# Patient Record
Sex: Female | Born: 1945
Health system: Southern US, Community
[De-identification: ages and names within clinical notes are randomized; demographics above are authoritative.]

## PROBLEM LIST (undated history)

## (undated) DIAGNOSIS — E039 Hypothyroidism, unspecified: Secondary | ICD-10-CM

## (undated) DIAGNOSIS — Z9889 Other specified postprocedural states: Secondary | ICD-10-CM

## (undated) DIAGNOSIS — F419 Anxiety disorder, unspecified: Secondary | ICD-10-CM

## (undated) DIAGNOSIS — M797 Fibromyalgia: Secondary | ICD-10-CM

## (undated) DIAGNOSIS — C4490 Unspecified malignant neoplasm of skin, unspecified: Secondary | ICD-10-CM

## (undated) DIAGNOSIS — C50919 Malignant neoplasm of unspecified site of unspecified female breast: Secondary | ICD-10-CM

## (undated) DIAGNOSIS — K219 Gastro-esophageal reflux disease without esophagitis: Secondary | ICD-10-CM

## (undated) DIAGNOSIS — M1990 Unspecified osteoarthritis, unspecified site: Secondary | ICD-10-CM

## (undated) DIAGNOSIS — T8859XA Other complications of anesthesia, initial encounter: Secondary | ICD-10-CM

## (undated) DIAGNOSIS — E785 Hyperlipidemia, unspecified: Secondary | ICD-10-CM

## (undated) DIAGNOSIS — R112 Nausea with vomiting, unspecified: Secondary | ICD-10-CM

## (undated) HISTORY — DX: Hypothyroidism, unspecified: E03.9

## (undated) HISTORY — DX: Hyperlipidemia, unspecified: E78.5

## (undated) HISTORY — PX: TONSILLECTOMY: SUR1361

## (undated) HISTORY — PX: ABDOMINAL HYSTERECTOMY: SHX81

## (undated) HISTORY — DX: Gastro-esophageal reflux disease without esophagitis: K21.9

## (undated) HISTORY — PX: LIVER SURGERY: SHX698

## (undated) HISTORY — DX: Malignant neoplasm of unspecified site of unspecified female breast: C50.919

## (undated) HISTORY — DX: Unspecified malignant neoplasm of skin, unspecified: C44.90

## (undated) HISTORY — DX: Fibromyalgia: M79.7

## (undated) HISTORY — DX: Unspecified osteoarthritis, unspecified site: M19.90

---

## 2006-12-15 ENCOUNTER — Encounter: Admission: RE | Admit: 2006-12-15 | Discharge: 2006-12-15 | Payer: Self-pay | Admitting: Pulmonary Disease

## 2015-11-24 HISTORY — PX: COLONOSCOPY: SHX174

## 2015-11-24 LAB — HM COLONOSCOPY

## 2016-09-06 DIAGNOSIS — M797 Fibromyalgia: Secondary | ICD-10-CM | POA: Insufficient documentation

## 2016-09-06 DIAGNOSIS — M19042 Primary osteoarthritis, left hand: Secondary | ICD-10-CM

## 2016-09-06 DIAGNOSIS — M47812 Spondylosis without myelopathy or radiculopathy, cervical region: Secondary | ICD-10-CM | POA: Insufficient documentation

## 2016-09-06 DIAGNOSIS — E039 Hypothyroidism, unspecified: Secondary | ICD-10-CM | POA: Insufficient documentation

## 2016-09-06 DIAGNOSIS — K279 Peptic ulcer, site unspecified, unspecified as acute or chronic, without hemorrhage or perforation: Secondary | ICD-10-CM | POA: Insufficient documentation

## 2016-09-06 DIAGNOSIS — M19041 Primary osteoarthritis, right hand: Secondary | ICD-10-CM | POA: Insufficient documentation

## 2016-09-06 DIAGNOSIS — M170 Bilateral primary osteoarthritis of knee: Secondary | ICD-10-CM | POA: Insufficient documentation

## 2016-09-07 ENCOUNTER — Encounter: Payer: Self-pay | Admitting: *Deleted

## 2016-09-08 ENCOUNTER — Ambulatory Visit (INDEPENDENT_AMBULATORY_CARE_PROVIDER_SITE_OTHER): Payer: Medicare HMO | Admitting: Rheumatology

## 2016-09-08 ENCOUNTER — Encounter: Payer: Self-pay | Admitting: Rheumatology

## 2016-09-08 VITALS — BP 164/89 | HR 70 | Ht 61.0 in | Wt 151.0 lb

## 2016-09-08 DIAGNOSIS — E039 Hypothyroidism, unspecified: Secondary | ICD-10-CM | POA: Diagnosis not present

## 2016-09-08 DIAGNOSIS — M170 Bilateral primary osteoarthritis of knee: Secondary | ICD-10-CM

## 2016-09-08 DIAGNOSIS — M19041 Primary osteoarthritis, right hand: Secondary | ICD-10-CM | POA: Diagnosis not present

## 2016-09-08 DIAGNOSIS — M19042 Primary osteoarthritis, left hand: Secondary | ICD-10-CM

## 2016-09-08 DIAGNOSIS — M797 Fibromyalgia: Secondary | ICD-10-CM | POA: Diagnosis not present

## 2016-09-08 DIAGNOSIS — M47812 Spondylosis without myelopathy or radiculopathy, cervical region: Secondary | ICD-10-CM

## 2016-09-08 DIAGNOSIS — K279 Peptic ulcer, site unspecified, unspecified as acute or chronic, without hemorrhage or perforation: Secondary | ICD-10-CM

## 2016-09-08 DIAGNOSIS — M5030 Other cervical disc degeneration, unspecified cervical region: Secondary | ICD-10-CM

## 2016-09-08 NOTE — Progress Notes (Signed)
*IMAGE* Office Visit Note  Patient: Gina Hunt             Date of Birth: 02/09/1946           MRN: [id number]             PCP: Guadlupe Spanish, MD Referring: No ref. provider found Visit Date: 09/08/2016 Occupation:@GUAROCC @    Subjective:  Right knee joint pain  History of Present Illness: Gina Hunt is a 70 y.o. female with history of fibromyalgia and osteoarthritis she states she continues to have discomfort which is generalized in her muscles. She also has discomfort in her bilateral shoulders her right knee joint in her hands. None of the joints are swollen.  Activities of Daily Living:  Patient reports morning stiffness for 10 minutes.   Patient Denies nocturnal pain.  Difficulty dressing/grooming: Denies Difficulty climbing stairs: Denies Difficulty getting out of chair: Denies Difficulty using hands for taps, buttons, cutlery, and/or writing: Denies   Review of Systems  Constitutional: Positive for fatigue. Negative for night sweats, weight gain, weight loss and weakness.  HENT: Negative for mouth sores, trouble swallowing, trouble swallowing, mouth dryness and nose dryness.   Eyes: Negative for pain, redness, visual disturbance and dryness.  Respiratory: Negative for cough, shortness of breath and difficulty breathing.   Cardiovascular: Negative for chest pain, palpitations, hypertension, irregular heartbeat and swelling in legs/feet.  Gastrointestinal: Negative for blood in stool, constipation and diarrhea.  Endocrine: Negative for increased urination.  Genitourinary: Negative for vaginal dryness.  Musculoskeletal: Positive for arthralgias, joint pain, myalgias, morning stiffness and myalgias. Negative for joint swelling, muscle weakness and muscle tenderness.  Skin: Negative for color change, rash, hair loss, skin tightness, ulcers and sensitivity to sunlight.  Allergic/Immunologic: Negative for susceptible to infections.  Neurological: Negative for  dizziness, memory loss and night sweats.  Hematological: Negative for swollen glands.  Psychiatric/Behavioral: Negative for depressed mood and sleep disturbance. The patient is not nervous/anxious.     PMFS History:  Patient Active Problem List   Diagnosis Date Noted  . Fibromyalgia 09/06/2016  . Primary osteoarthritis of both knees 09/06/2016  . Primary osteoarthritis of both hands 09/06/2016  . DJD (degenerative joint disease), cervical 09/06/2016  . Peptic ulcer disease 09/06/2016  . Hypothyroidism 09/06/2016    Past Medical History:  Diagnosis Date  . Fibromyalgia   . Hypothyroidism   . Osteoarthritis     Family History  Problem Relation Age of Onset  . Hypertension Mother   . Diabetes Mother   . Arthritis Mother   . Rheum arthritis Father   . Diabetes Father   . Heart disease Father    Past Surgical History:  Procedure Laterality Date  . ABDOMINAL HYSTERECTOMY    . LIVER SURGERY    . TONSILLECTOMY     Social History   Social History Narrative  . No narrative on file     Objective: Vital Signs: BP (!) 164/89 (BP Location: Left Arm, Patient Position: Sitting, Cuff Size: Large)   Pulse 70   Ht 5\' 1"  (1.549 m)   Wt 151 lb (68.5 kg)   BMI 28.53 kg/m    Physical Exam  Constitutional: She is oriented to person, place, and time. She appears well-developed and well-nourished.  HENT:  Head: Normocephalic and atraumatic.  Eyes: Conjunctivae and EOM are normal.  Neck: Normal range of motion.  Cardiovascular: Normal rate, regular rhythm, normal heart sounds and intact distal pulses.   Pulmonary/Chest: Effort normal and breath sounds normal.  Abdominal: Soft. Bowel sounds are normal.  Lymphadenopathy:    She has no cervical adenopathy.  Neurological: She is alert and oriented to person, place, and time.  Skin: Skin is warm and dry. Capillary refill takes less than 2 seconds.  Psychiatric: She has a normal mood and affect. Her behavior is normal.  Nursing note  and vitals reviewed.    Musculoskeletal Exam: She is good range of motion of her C-spine, thoracic, lumbar spine. Animal tenderness over right gluteal area. She is good range of motion of her shoulders and elbow joints. She is thickening of bilateral CMC PIP/DIP joints. She is crepitus in her right knee joint without any warmth swelling or effusion. All other joints full range of motion with no warmth swelling or effusion. Fibromyalgia tender points only 8 out of 18 positive.  CDAI Exam: No CDAI exam completed.    Investigation: No additional findings.   Imaging: No results found.  Speciality Comments: No specialty comments available.    Procedures:  No procedures performed Allergies: Codeine and Morphine and related   Assessment / Plan: Visit Diagnoses:  Fibromyalgia: She continues to have some generalized pain fatigue and tender points. Need for regular exercise and muscle strengthening was discussed.  Primary osteoarthritis of both knees: She's been having increased pain in her right knee joint. She has no warmth swelling or effusion she is some crepitus. Muscle strengthening exercises will be helpful.  Primary osteoarthritis of both hands: She has some underlying osteoarthritis and have some discomfort in her hands joint protection and muscle strengthening was discussed.  DJD (degenerative joint disease), cervical: She continues to have some stiffness but no increased pain lately.  Her blood pressure was elevated today have advised her to monitor that and follow up with her PCP. Other medical problems are as follows which are followed by her PCP:  Patient states that she had DEXA done by her PCP last year and she will bring it next visit to discuss.  Peptic ulcer disease  Hypothyroidism, unspecified type    Orders: No orders of the defined types were placed in this encounter.   Face-to-face time spent with patient was 25 minutes. 50% of time was spent in counseling  and coordination of care.  Follow-Up Instructions: Return in about 6 months (around 03/08/2017) for Osteoarthritis.      Bo Merino, MD, [facility]

## 2016-11-04 DIAGNOSIS — M545 Low back pain: Secondary | ICD-10-CM | POA: Diagnosis not present

## 2016-11-04 DIAGNOSIS — J019 Acute sinusitis, unspecified: Secondary | ICD-10-CM | POA: Diagnosis not present

## 2016-11-04 DIAGNOSIS — M5136 Other intervertebral disc degeneration, lumbar region: Secondary | ICD-10-CM | POA: Diagnosis not present

## 2016-11-04 DIAGNOSIS — Z79899 Other long term (current) drug therapy: Secondary | ICD-10-CM | POA: Diagnosis not present

## 2016-11-16 DIAGNOSIS — E785 Hyperlipidemia, unspecified: Secondary | ICD-10-CM | POA: Diagnosis not present

## 2016-11-16 DIAGNOSIS — Z961 Presence of intraocular lens: Secondary | ICD-10-CM | POA: Diagnosis not present

## 2016-11-16 DIAGNOSIS — M797 Fibromyalgia: Secondary | ICD-10-CM | POA: Diagnosis not present

## 2016-11-16 DIAGNOSIS — K219 Gastro-esophageal reflux disease without esophagitis: Secondary | ICD-10-CM | POA: Diagnosis not present

## 2016-11-16 DIAGNOSIS — E039 Hypothyroidism, unspecified: Secondary | ICD-10-CM | POA: Diagnosis not present

## 2016-11-16 DIAGNOSIS — R69 Illness, unspecified: Secondary | ICD-10-CM | POA: Diagnosis not present

## 2016-11-16 DIAGNOSIS — H2512 Age-related nuclear cataract, left eye: Secondary | ICD-10-CM | POA: Diagnosis not present

## 2016-11-16 DIAGNOSIS — Z885 Allergy status to narcotic agent status: Secondary | ICD-10-CM | POA: Diagnosis not present

## 2016-11-17 DIAGNOSIS — H2511 Age-related nuclear cataract, right eye: Secondary | ICD-10-CM | POA: Diagnosis not present

## 2016-11-21 DIAGNOSIS — I8311 Varicose veins of right lower extremity with inflammation: Secondary | ICD-10-CM | POA: Diagnosis not present

## 2016-11-21 DIAGNOSIS — I8312 Varicose veins of left lower extremity with inflammation: Secondary | ICD-10-CM | POA: Diagnosis not present

## 2016-11-30 DIAGNOSIS — Z79899 Other long term (current) drug therapy: Secondary | ICD-10-CM | POA: Diagnosis not present

## 2016-11-30 DIAGNOSIS — H269 Unspecified cataract: Secondary | ICD-10-CM | POA: Diagnosis not present

## 2016-11-30 DIAGNOSIS — H2511 Age-related nuclear cataract, right eye: Secondary | ICD-10-CM | POA: Diagnosis not present

## 2016-11-30 DIAGNOSIS — Z961 Presence of intraocular lens: Secondary | ICD-10-CM | POA: Diagnosis not present

## 2016-11-30 DIAGNOSIS — K219 Gastro-esophageal reflux disease without esophagitis: Secondary | ICD-10-CM | POA: Diagnosis not present

## 2016-11-30 DIAGNOSIS — Z886 Allergy status to analgesic agent status: Secondary | ICD-10-CM | POA: Diagnosis not present

## 2016-11-30 DIAGNOSIS — R69 Illness, unspecified: Secondary | ICD-10-CM | POA: Diagnosis not present

## 2016-11-30 DIAGNOSIS — E039 Hypothyroidism, unspecified: Secondary | ICD-10-CM | POA: Diagnosis not present

## 2016-11-30 DIAGNOSIS — E7800 Pure hypercholesterolemia, unspecified: Secondary | ICD-10-CM | POA: Diagnosis not present

## 2016-12-05 DIAGNOSIS — Z79899 Other long term (current) drug therapy: Secondary | ICD-10-CM | POA: Diagnosis not present

## 2016-12-05 DIAGNOSIS — M797 Fibromyalgia: Secondary | ICD-10-CM | POA: Diagnosis not present

## 2016-12-05 DIAGNOSIS — M545 Low back pain: Secondary | ICD-10-CM | POA: Diagnosis not present

## 2016-12-20 DIAGNOSIS — I8312 Varicose veins of left lower extremity with inflammation: Secondary | ICD-10-CM | POA: Diagnosis not present

## 2016-12-22 DIAGNOSIS — I8311 Varicose veins of right lower extremity with inflammation: Secondary | ICD-10-CM | POA: Diagnosis not present

## 2016-12-29 DIAGNOSIS — I8311 Varicose veins of right lower extremity with inflammation: Secondary | ICD-10-CM | POA: Diagnosis not present

## 2017-01-02 DIAGNOSIS — M545 Low back pain: Secondary | ICD-10-CM | POA: Diagnosis not present

## 2017-01-02 DIAGNOSIS — K219 Gastro-esophageal reflux disease without esophagitis: Secondary | ICD-10-CM | POA: Diagnosis not present

## 2017-01-02 DIAGNOSIS — M5136 Other intervertebral disc degeneration, lumbar region: Secondary | ICD-10-CM | POA: Diagnosis not present

## 2017-01-02 DIAGNOSIS — Z0000 Encounter for general adult medical examination without abnormal findings: Secondary | ICD-10-CM | POA: Diagnosis not present

## 2017-01-11 DIAGNOSIS — M7981 Nontraumatic hematoma of soft tissue: Secondary | ICD-10-CM | POA: Diagnosis not present

## 2017-01-25 ENCOUNTER — Telehealth: Payer: Self-pay | Admitting: Rheumatology

## 2017-01-25 NOTE — Telephone Encounter (Signed)
Patient calling to refer her niece to [place]. Dr. Rollene Fare is the PCP.  I did explain the protocol.  She just asks that I let you know that [name] Luan Pulling is her niece.

## 2017-01-26 DIAGNOSIS — I8311 Varicose veins of right lower extremity with inflammation: Secondary | ICD-10-CM | POA: Diagnosis not present

## 2017-02-02 DIAGNOSIS — Z79899 Other long term (current) drug therapy: Secondary | ICD-10-CM | POA: Diagnosis not present

## 2017-02-02 DIAGNOSIS — M810 Age-related osteoporosis without current pathological fracture: Secondary | ICD-10-CM | POA: Diagnosis not present

## 2017-02-02 DIAGNOSIS — M545 Low back pain: Secondary | ICD-10-CM | POA: Diagnosis not present

## 2017-02-02 DIAGNOSIS — M5136 Other intervertebral disc degeneration, lumbar region: Secondary | ICD-10-CM | POA: Diagnosis not present

## 2017-02-02 LAB — HM DEXA SCAN

## 2017-02-27 DIAGNOSIS — I8311 Varicose veins of right lower extremity with inflammation: Secondary | ICD-10-CM | POA: Diagnosis not present

## 2017-03-06 DIAGNOSIS — Z8711 Personal history of peptic ulcer disease: Secondary | ICD-10-CM | POA: Insufficient documentation

## 2017-03-06 DIAGNOSIS — Z8639 Personal history of other endocrine, nutritional and metabolic disease: Secondary | ICD-10-CM | POA: Insufficient documentation

## 2017-03-06 NOTE — Progress Notes (Signed)
Office Visit Note  Patient: Gina Hunt             Date of Birth: [birth year]           MRN: [id number]             PCP: Guadlupe Spanish, MD Referring: Guadlupe Spanish, MD Visit Date: 03/09/2017 Occupation: @GUAROCC @    Subjective:  Pain in multiple joints   History of Present Illness: Gina Hunt is a 71 y.o. female with history of fibromyalgia and osteoarthritis. She states over the last 2-3 months she's been having increased pain all over. She describes pain in her bilateral hands bilateral knee joints and bilateral feet. Occasional swelling in her bilateral hands. She continues to have some discomfort in her C-spine. It is difficult to differentiate between the fibromyalgia and osteoarthritis pain for her. She reports the pain on the scale of 0-10 about 10. And fatigue about 5-6.  Activities of Daily Living:  Patient reports morning stiffness for 5 hours.   Patient Reports nocturnal pain.  Difficulty dressing/grooming: Denies Difficulty climbing stairs: Reports Difficulty getting out of chair: Reports Difficulty using hands for taps, buttons, cutlery, and/or writing: Denies   Review of Systems  Constitutional: Positive for fatigue. Negative for night sweats, weight gain, weight loss and weakness.  HENT: Positive for mouth dryness. Negative for mouth sores, trouble swallowing, trouble swallowing and nose dryness.   Eyes: Negative for pain, redness, visual disturbance and dryness.  Respiratory: Negative for cough, shortness of breath and difficulty breathing.   Cardiovascular: Negative for chest pain, palpitations, hypertension, irregular heartbeat and swelling in legs/feet.  Gastrointestinal: Negative for blood in stool, constipation and diarrhea.  Endocrine: Negative for increased urination.  Genitourinary: Negative for vaginal dryness.  Musculoskeletal: Positive for arthralgias, joint pain, myalgias, morning stiffness and myalgias. Negative for joint swelling,  muscle weakness and muscle tenderness.  Skin: Negative for color change, rash, hair loss, skin tightness, ulcers and sensitivity to sunlight.  Allergic/Immunologic: Negative for susceptible to infections.  Neurological: Negative for dizziness, memory loss and night sweats.  Hematological: Negative for swollen glands.  Psychiatric/Behavioral: Negative for depressed mood and sleep disturbance. The patient is not nervous/anxious.     PMFS History:  Patient Active Problem List   Diagnosis Date Noted  . Dyslipidemia 03/08/2017  . History of peptic ulcer 03/06/2017  . History of hypothyroidism 03/06/2017  . Fibromyalgia 09/06/2016  . Primary osteoarthritis of both knees 09/06/2016  . Primary osteoarthritis of both hands 09/06/2016  . DJD (degenerative joint disease), cervical 09/06/2016  . Peptic ulcer disease 09/06/2016  . Hypothyroidism 09/06/2016    Past Medical History:  Diagnosis Date  . Fibromyalgia   . Hypothyroidism   . Osteoarthritis     Family History  Problem Relation Age of Onset  . Hypertension Mother   . Diabetes Mother   . Arthritis Mother   . Rheum arthritis Father   . Diabetes Father   . Heart disease Father    Past Surgical History:  Procedure Laterality Date  . ABDOMINAL HYSTERECTOMY    . LIVER SURGERY    . TONSILLECTOMY     Social History   Social History Narrative  . No narrative on file     Objective: Vital Signs: BP (!) 152/69 (BP Location: Left Arm, Patient Position: Sitting, Cuff Size: Normal)   Pulse 69   Resp 16   Ht 5\' 2"  (1.575 m)   Wt 146 lb (66.2 kg)   BMI 26.70 kg/m  Physical Exam  Constitutional: She is oriented to person, place, and time. She appears well-developed and well-nourished.  HENT:  Head: Normocephalic and atraumatic.  Eyes: Conjunctivae and EOM are normal.  Neck: Normal range of motion.  Cardiovascular: Normal rate, regular rhythm, normal heart sounds and intact distal pulses.   Pulmonary/Chest: Effort normal  and breath sounds normal.  Abdominal: Soft. Bowel sounds are normal.  Lymphadenopathy:    She has no cervical adenopathy.  Neurological: She is alert and oriented to person, place, and time.  Skin: Skin is warm and dry. Capillary refill takes less than 2 seconds.  Psychiatric: She has a normal mood and affect. Her behavior is normal.  Nursing note and vitals reviewed.    Musculoskeletal Exam: C-spine some discomfort with range of motion thoracic and lumbar spine good range of motion. Shoulder joints although joints wrist joints are good range of motion. She has DIP PIP thickening in her hands with no synovitis. Hip joints knee joints ankles MTP PIPs DIPs are good range of motion with no synovitis. Fibromyalgia tender points were 18 out of 18 positive.  CDAI Exam: No CDAI exam completed.    Investigation: No additional findings.  Imaging: No results found.  Speciality Comments: No specialty comments available.    Procedures:  No procedures performed Allergies: Codeine and Morphine and related   Assessment / Plan:     Visit Diagnoses: Fibromyalgia: She has generalized pain and discomfort and positive tender points. She continues to have some generalized pain from fibromyalgia despite being on multiple medications. We had detailed discussion regarding that. She is on Lexapro and one possibility is to switching her to Cymbalta. I've advised her to discuss that further with her PCP. She may also benefit by adding Zanaflex at bedtime. She will did discuss that with her PCP as well. Need for regular  exercise, water aerobics and massage therapy was discussed.  Primary osteoarthritis of both hands: She has ongoing pain and discomfort in her hands due to underlying osteoarthritis. Joint protection and muscle strengthening was discussed.  She has left second trigger finger: I discussed splinting and we will schedule ultrasound guided trigger finger injection for that.  Primary  osteoarthritis of both knees: She has ongoing chronic pain. They've offered viscous supplement injections in the past but they're not covered by insurance very well.  DJD (degenerative joint disease), cervical: Chronic pain  History of peptic ulcer  History of hypothyroidism  Dyslipidemia    Orders: No orders of the defined types were placed in this encounter.  No orders of the defined types were placed in this encounter.   Face-to-face time spent with patient was 30 minutes. 50% of time was spent in counseling and coordination of care.  Follow-Up Instructions: Return in about 6 months (around 09/09/2017) for OA, FMS, TF.   Bo Merino, MD  Note - This record has been created using Editor, commissioning.  Chart creation errors have been sought, but may not always  have been located. Such creation errors do not reflect on  the standard of medical care.

## 2017-03-08 DIAGNOSIS — E785 Hyperlipidemia, unspecified: Secondary | ICD-10-CM | POA: Insufficient documentation

## 2017-03-09 ENCOUNTER — Encounter: Payer: Self-pay | Admitting: Rheumatology

## 2017-03-09 ENCOUNTER — Ambulatory Visit (INDEPENDENT_AMBULATORY_CARE_PROVIDER_SITE_OTHER): Payer: Medicare HMO | Admitting: Rheumatology

## 2017-03-09 VITALS — BP 152/69 | HR 69 | Resp 16 | Ht 62.0 in | Wt 146.0 lb

## 2017-03-09 DIAGNOSIS — E785 Hyperlipidemia, unspecified: Secondary | ICD-10-CM | POA: Diagnosis not present

## 2017-03-09 DIAGNOSIS — M65322 Trigger finger, left index finger: Secondary | ICD-10-CM

## 2017-03-09 DIAGNOSIS — M19042 Primary osteoarthritis, left hand: Secondary | ICD-10-CM

## 2017-03-09 DIAGNOSIS — M19041 Primary osteoarthritis, right hand: Secondary | ICD-10-CM | POA: Diagnosis not present

## 2017-03-09 DIAGNOSIS — Z8711 Personal history of peptic ulcer disease: Secondary | ICD-10-CM | POA: Diagnosis not present

## 2017-03-09 DIAGNOSIS — M170 Bilateral primary osteoarthritis of knee: Secondary | ICD-10-CM | POA: Diagnosis not present

## 2017-03-09 DIAGNOSIS — Z8639 Personal history of other endocrine, nutritional and metabolic disease: Secondary | ICD-10-CM | POA: Diagnosis not present

## 2017-03-09 DIAGNOSIS — M797 Fibromyalgia: Secondary | ICD-10-CM

## 2017-03-09 DIAGNOSIS — M5030 Other cervical disc degeneration, unspecified cervical region: Secondary | ICD-10-CM

## 2017-03-09 DIAGNOSIS — M47812 Spondylosis without myelopathy or radiculopathy, cervical region: Secondary | ICD-10-CM

## 2017-04-03 DIAGNOSIS — E7800 Pure hypercholesterolemia, unspecified: Secondary | ICD-10-CM | POA: Diagnosis not present

## 2017-04-03 DIAGNOSIS — D539 Nutritional anemia, unspecified: Secondary | ICD-10-CM | POA: Diagnosis not present

## 2017-04-03 DIAGNOSIS — R300 Dysuria: Secondary | ICD-10-CM | POA: Diagnosis not present

## 2017-04-03 DIAGNOSIS — M5136 Other intervertebral disc degeneration, lumbar region: Secondary | ICD-10-CM | POA: Diagnosis not present

## 2017-04-03 DIAGNOSIS — E559 Vitamin D deficiency, unspecified: Secondary | ICD-10-CM | POA: Diagnosis not present

## 2017-04-03 DIAGNOSIS — M545 Low back pain: Secondary | ICD-10-CM | POA: Diagnosis not present

## 2017-04-03 DIAGNOSIS — R0602 Shortness of breath: Secondary | ICD-10-CM | POA: Diagnosis not present

## 2017-04-03 DIAGNOSIS — Z79899 Other long term (current) drug therapy: Secondary | ICD-10-CM | POA: Diagnosis not present

## 2017-04-03 DIAGNOSIS — M129 Arthropathy, unspecified: Secondary | ICD-10-CM | POA: Diagnosis not present

## 2017-04-03 DIAGNOSIS — E039 Hypothyroidism, unspecified: Secondary | ICD-10-CM | POA: Diagnosis not present

## 2017-04-03 LAB — PULMONARY FUNCTION TEST

## 2017-04-10 DIAGNOSIS — I8311 Varicose veins of right lower extremity with inflammation: Secondary | ICD-10-CM | POA: Diagnosis not present

## 2017-05-04 DIAGNOSIS — Z79899 Other long term (current) drug therapy: Secondary | ICD-10-CM | POA: Diagnosis not present

## 2017-05-04 DIAGNOSIS — M5136 Other intervertebral disc degeneration, lumbar region: Secondary | ICD-10-CM | POA: Diagnosis not present

## 2017-05-04 DIAGNOSIS — M545 Low back pain: Secondary | ICD-10-CM | POA: Diagnosis not present

## 2017-05-15 DIAGNOSIS — M7981 Nontraumatic hematoma of soft tissue: Secondary | ICD-10-CM | POA: Diagnosis not present

## 2017-05-19 DIAGNOSIS — R5383 Other fatigue: Secondary | ICD-10-CM | POA: Insufficient documentation

## 2017-05-19 NOTE — Progress Notes (Deleted)
   Office Visit Note  Patient: Gina Hunt             Date of Birth: October 14, 1946           MRN: [id number]             PCP: Guadlupe Spanish, MD Referring: Guadlupe Spanish, MD Visit Date: 05/24/2017 Occupation: @GUAROCC @    Subjective:  No chief complaint on file.   History of Present Illness: Gina Hunt is a 71 y.o. female ***   Activities of Daily Living:  Patient reports morning stiffness for *** {minute/hour:19697}.   Patient {ACTIONS;DENIES/REPORTS:21021675::"Denies"} nocturnal pain.  Difficulty dressing/grooming: {ACTIONS;DENIES/REPORTS:21021675::"Denies"} Difficulty climbing stairs: {ACTIONS;DENIES/REPORTS:21021675::"Denies"} Difficulty getting out of chair: {ACTIONS;DENIES/REPORTS:21021675::"Denies"} Difficulty using hands for taps, buttons, cutlery, and/or writing: {ACTIONS;DENIES/REPORTS:21021675::"Denies"}   No Rheumatology ROS completed.   PMFS History:  Patient Active Problem List   Diagnosis Date Noted  . Other fatigue 05/19/2017  . Dyslipidemia 03/08/2017  . History of peptic ulcer 03/06/2017  . History of hypothyroidism 03/06/2017  . Fibromyalgia 09/06/2016  . Primary osteoarthritis of both knees 09/06/2016  . Primary osteoarthritis of both hands 09/06/2016  . DJD (degenerative joint disease), cervical 09/06/2016  . Peptic ulcer disease 09/06/2016  . Hypothyroidism 09/06/2016    Past Medical History:  Diagnosis Date  . Fibromyalgia   . Hypothyroidism   . Osteoarthritis     Family History  Problem Relation Age of Onset  . Hypertension Mother   . Diabetes Mother   . Arthritis Mother   . Rheum arthritis Father   . Diabetes Father   . Heart disease Father    Past Surgical History:  Procedure Laterality Date  . ABDOMINAL HYSTERECTOMY    . LIVER SURGERY    . TONSILLECTOMY     Social History   Social History Narrative  . No narrative on file     Objective: Vital Signs: There were no vitals taken for this visit.   Physical  Exam   Musculoskeletal Exam: ***  CDAI Exam: No CDAI exam completed.    Investigation: No additional findings.    Imaging: No results found.  Speciality Comments: No specialty comments available.    Procedures:  No procedures performed Allergies: Codeine and Morphine and related   Assessment / Plan:     Visit Diagnoses: Fibromyalgia  Other fatigue  Primary osteoarthritis of both hands  Primary osteoarthritis of both knees  History of hypothyroidism  History of peptic ulcer    Orders: No orders of the defined types were placed in this encounter.  No orders of the defined types were placed in this encounter.   Face-to-face time spent with patient was *** minutes. 50% of time was spent in counseling and coordination of care.  Follow-Up Instructions: No Follow-up on file.   Amy Littrell, RT  Note - This record has been created using [employer].  Chart creation errors have been sought, but may not always  have been located. Such creation errors do not reflect on  the standard of medical care.

## 2017-05-24 ENCOUNTER — Ambulatory Visit (INDEPENDENT_AMBULATORY_CARE_PROVIDER_SITE_OTHER): Payer: Medicare HMO | Admitting: Rheumatology

## 2017-05-24 DIAGNOSIS — M65322 Trigger finger, left index finger: Secondary | ICD-10-CM | POA: Diagnosis not present

## 2017-05-24 MED ORDER — TRIAMCINOLONE ACETONIDE 40 MG/ML IJ SUSP
10.0000 mg | INTRAMUSCULAR | Status: AC | PRN
  Administered 2017-05-24: 10 mg

## 2017-05-24 MED ORDER — LIDOCAINE HCL 1 % IJ SOLN
0.5000 mL | INTRAMUSCULAR | Status: AC | PRN
  Administered 2017-05-24: .5 mL

## 2017-05-24 NOTE — Progress Notes (Addendum)
   Procedure Note  Patient: Gina Hunt             Date of Birth: 1946-01-15           MRN: [id number]             Visit Date: 05/24/2017  Procedures: Visit Diagnoses: Trigger index finger of left hand  Hand/UE Inj Date/Time: 05/24/2017 3:17 PM Performed by: Bo Merino Authorized by: Bo Merino   Consent Given by:  Patient Site marked: the procedure site was marked   Timeout: prior to procedure the correct patient, procedure, and site was verified   Indications:  Therapeutic and tendon swelling Condition: trigger finger   Location:  Index finger Site:  L index A1 Prep: patient was prepped and draped in usual sterile fashion   Needle Size:  27 G Approach:  Volar Ultrasound Guidance: Yes   Medications:  0.5 mL lidocaine 1 %; 10 mg triamcinolone acetonide 40 MG/ML Aspirate amount (mL):  0 Patient tolerance:  Patient tolerated the procedure well with no immediate complications   The finger was splinted .Bo Merino, MD

## 2017-06-01 DIAGNOSIS — M1380 Other specified arthritis, unspecified site: Secondary | ICD-10-CM | POA: Diagnosis not present

## 2017-06-01 DIAGNOSIS — N959 Unspecified menopausal and perimenopausal disorder: Secondary | ICD-10-CM | POA: Diagnosis not present

## 2017-06-01 DIAGNOSIS — Z6826 Body mass index (BMI) 26.0-26.9, adult: Secondary | ICD-10-CM | POA: Diagnosis not present

## 2017-06-01 DIAGNOSIS — Z0000 Encounter for general adult medical examination without abnormal findings: Secondary | ICD-10-CM | POA: Diagnosis not present

## 2017-06-01 DIAGNOSIS — M797 Fibromyalgia: Secondary | ICD-10-CM | POA: Diagnosis not present

## 2017-06-01 DIAGNOSIS — E7800 Pure hypercholesterolemia, unspecified: Secondary | ICD-10-CM | POA: Diagnosis not present

## 2017-06-01 DIAGNOSIS — K219 Gastro-esophageal reflux disease without esophagitis: Secondary | ICD-10-CM | POA: Diagnosis not present

## 2017-06-01 DIAGNOSIS — K5900 Constipation, unspecified: Secondary | ICD-10-CM | POA: Diagnosis not present

## 2017-06-01 DIAGNOSIS — E039 Hypothyroidism, unspecified: Secondary | ICD-10-CM | POA: Diagnosis not present

## 2017-06-05 DIAGNOSIS — M5030 Other cervical disc degeneration, unspecified cervical region: Secondary | ICD-10-CM | POA: Diagnosis not present

## 2017-06-05 DIAGNOSIS — M545 Low back pain: Secondary | ICD-10-CM | POA: Diagnosis not present

## 2017-06-05 DIAGNOSIS — M542 Cervicalgia: Secondary | ICD-10-CM | POA: Diagnosis not present

## 2017-06-05 DIAGNOSIS — Z79899 Other long term (current) drug therapy: Secondary | ICD-10-CM | POA: Diagnosis not present

## 2017-06-05 DIAGNOSIS — M5136 Other intervertebral disc degeneration, lumbar region: Secondary | ICD-10-CM | POA: Diagnosis not present

## 2017-06-06 NOTE — Progress Notes (Deleted)
   Office Visit Note  Patient: Gina Hunt             Date of Birth: 07/21/1946           MRN: [id number]             PCP: Guadlupe Spanish, MD Referring: Guadlupe Spanish, MD Visit Date: 06/07/2017 Occupation: @GUAROCC @    Subjective:  No chief complaint on file.   History of Present Illness: Gina Hunt is a 71 y.o. female ***   Activities of Daily Living:  Patient reports morning stiffness for *** {minute/hour:19697}.   Patient {ACTIONS;DENIES/REPORTS:21021675::"Denies"} nocturnal pain.  Difficulty dressing/grooming: {ACTIONS;DENIES/REPORTS:21021675::"Denies"} Difficulty climbing stairs: {ACTIONS;DENIES/REPORTS:21021675::"Denies"} Difficulty getting out of chair: {ACTIONS;DENIES/REPORTS:21021675::"Denies"} Difficulty using hands for taps, buttons, cutlery, and/or writing: {ACTIONS;DENIES/REPORTS:21021675::"Denies"}   No Rheumatology ROS completed.   PMFS History:  Patient Active Problem List   Diagnosis Date Noted  . Other fatigue 05/19/2017  . Dyslipidemia 03/08/2017  . History of peptic ulcer 03/06/2017  . History of hypothyroidism 03/06/2017  . Fibromyalgia 09/06/2016  . Primary osteoarthritis of both knees 09/06/2016  . Primary osteoarthritis of both hands 09/06/2016  . DJD (degenerative joint disease), cervical 09/06/2016  . Peptic ulcer disease 09/06/2016  . Hypothyroidism 09/06/2016    Past Medical History:  Diagnosis Date  . Fibromyalgia   . Hypothyroidism   . Osteoarthritis     Family History  Problem Relation Age of Onset  . Hypertension Mother   . Diabetes Mother   . Arthritis Mother   . Rheum arthritis Father   . Diabetes Father   . Heart disease Father    Past Surgical History:  Procedure Laterality Date  . ABDOMINAL HYSTERECTOMY    . LIVER SURGERY    . TONSILLECTOMY     Social History   Social History Narrative  . No narrative on file     Objective: Vital Signs: There were no vitals taken for this visit.   Physical  Exam   Musculoskeletal Exam: ***  CDAI Exam: No CDAI exam completed.    Investigation: No additional findings.   Imaging: No results found.  Speciality Comments: No specialty comments available.    Procedures:  No procedures performed Allergies: Codeine and Morphine and related   Assessment / Plan:     Visit Diagnoses: Fibromyalgia  Other fatigue  DJD (degenerative joint disease), cervical  Primary osteoarthritis of both hands  Primary osteoarthritis of both knees  History of hypothyroidism  History of peptic ulcer    Orders: No orders of the defined types were placed in this encounter.  No orders of the defined types were placed in this encounter.   Face-to-face time spent with patient was *** minutes. 50% of time was spent in counseling and coordination of care.  Follow-Up Instructions: No Follow-up on file.   Bo Merino, MD  Note - This record has been created using Editor, commissioning.  Chart creation errors have been sought, but may not always  have been located. Such creation errors do not reflect on  the standard of medical care.

## 2017-06-07 ENCOUNTER — Ambulatory Visit (INDEPENDENT_AMBULATORY_CARE_PROVIDER_SITE_OTHER): Payer: Medicare HMO | Admitting: Rheumatology

## 2017-06-07 ENCOUNTER — Encounter: Payer: Self-pay | Admitting: Rheumatology

## 2017-06-07 VITALS — BP 120/72 | HR 74 | Resp 14 | Ht 61.0 in | Wt 146.0 lb

## 2017-06-07 DIAGNOSIS — M65341 Trigger finger, right ring finger: Secondary | ICD-10-CM | POA: Diagnosis not present

## 2017-06-07 MED ORDER — TRIAMCINOLONE ACETONIDE 40 MG/ML IJ SUSP
10.0000 mg | INTRAMUSCULAR | Status: AC | PRN
  Administered 2017-06-07: 10 mg

## 2017-06-07 MED ORDER — LIDOCAINE HCL 1 % IJ SOLN
0.5000 mL | INTRAMUSCULAR | Status: AC | PRN
  Administered 2017-06-07: .5 mL

## 2017-06-07 NOTE — Progress Notes (Signed)
   Procedure Note  Patient: Gina Hunt             Date of Birth: 03/28/1946           MRN: [id number]             Visit Date: 06/07/2017  Procedures: Visit Diagnoses: Trigger ring finger of right hand   Patient had good response to her left second trigger finger injection although she still having some discomfort. She states her right fourth finger has been triggering and causing some discomfort.  On exam she had tenderness on palpation over right fourth flexor tendon. We decided to proceed with cortisone injection under ultrasound guidance. The procedure is described below.  Hand/UE Inj Date/Time: 06/07/2017 2:24 PM Performed by: Bo Merino Authorized by: Bo Merino   Consent Given by:  Patient Site marked: the procedure site was marked   Timeout: prior to procedure the correct patient, procedure, and site was verified   Indications:  Therapeutic and tendon swelling Condition: trigger finger   Location:  Ring finger Site:  R ring A1 Prep: patient was prepped and draped in usual sterile fashion   Needle Size:  27 G Approach:  Volar Ultrasound Guidance: Yes   Medications:  0.5 mL lidocaine 1 %; 10 mg triamcinolone acetonide 40 MG/ML Aspirate amount (mL):  0 Patient tolerance:  Patient tolerated the procedure well with no immediate complications   A finger splint was applied. Which she will use for 3 days. She's been also advised to use Voltaren gel topically. Patient to call us back if she has ongoing problems of the mesh of the back in 5 months. Bo Merino, MD

## 2017-06-08 DIAGNOSIS — H43811 Vitreous degeneration, right eye: Secondary | ICD-10-CM | POA: Diagnosis not present

## 2017-06-14 ENCOUNTER — Ambulatory Visit: Payer: Medicare HMO | Admitting: Rheumatology

## 2017-06-21 DIAGNOSIS — H43811 Vitreous degeneration, right eye: Secondary | ICD-10-CM | POA: Diagnosis not present

## 2017-06-21 DIAGNOSIS — H43812 Vitreous degeneration, left eye: Secondary | ICD-10-CM | POA: Diagnosis not present

## 2017-06-21 DIAGNOSIS — H43813 Vitreous degeneration, bilateral: Secondary | ICD-10-CM | POA: Diagnosis not present

## 2017-06-30 DIAGNOSIS — Z1231 Encounter for screening mammogram for malignant neoplasm of breast: Secondary | ICD-10-CM | POA: Diagnosis not present

## 2017-07-06 DIAGNOSIS — E7800 Pure hypercholesterolemia, unspecified: Secondary | ICD-10-CM | POA: Diagnosis not present

## 2017-07-06 DIAGNOSIS — M545 Low back pain: Secondary | ICD-10-CM | POA: Diagnosis not present

## 2017-07-06 DIAGNOSIS — M5136 Other intervertebral disc degeneration, lumbar region: Secondary | ICD-10-CM | POA: Diagnosis not present

## 2017-07-06 DIAGNOSIS — Z23 Encounter for immunization: Secondary | ICD-10-CM | POA: Diagnosis not present

## 2017-08-04 DIAGNOSIS — D539 Nutritional anemia, unspecified: Secondary | ICD-10-CM | POA: Diagnosis not present

## 2017-08-04 DIAGNOSIS — M545 Low back pain: Secondary | ICD-10-CM | POA: Diagnosis not present

## 2017-08-04 DIAGNOSIS — E559 Vitamin D deficiency, unspecified: Secondary | ICD-10-CM | POA: Diagnosis not present

## 2017-08-04 DIAGNOSIS — Z79899 Other long term (current) drug therapy: Secondary | ICD-10-CM | POA: Diagnosis not present

## 2017-08-04 DIAGNOSIS — E039 Hypothyroidism, unspecified: Secondary | ICD-10-CM | POA: Diagnosis not present

## 2017-08-04 DIAGNOSIS — E7800 Pure hypercholesterolemia, unspecified: Secondary | ICD-10-CM | POA: Diagnosis not present

## 2017-08-04 DIAGNOSIS — M129 Arthropathy, unspecified: Secondary | ICD-10-CM | POA: Diagnosis not present

## 2017-08-04 DIAGNOSIS — M5136 Other intervertebral disc degeneration, lumbar region: Secondary | ICD-10-CM | POA: Diagnosis not present

## 2017-08-22 DIAGNOSIS — H16213 Exposure keratoconjunctivitis, bilateral: Secondary | ICD-10-CM | POA: Diagnosis not present

## 2017-09-01 DIAGNOSIS — M5136 Other intervertebral disc degeneration, lumbar region: Secondary | ICD-10-CM | POA: Diagnosis not present

## 2017-09-01 DIAGNOSIS — Z79899 Other long term (current) drug therapy: Secondary | ICD-10-CM | POA: Diagnosis not present

## 2017-09-01 DIAGNOSIS — M545 Low back pain: Secondary | ICD-10-CM | POA: Diagnosis not present

## 2017-09-07 ENCOUNTER — Ambulatory Visit: Payer: Medicare HMO | Admitting: Rheumatology

## 2017-09-22 NOTE — Progress Notes (Signed)
Office Visit Note  Patient: Gina Hunt             Date of Birth: 1946-10-06           MRN: [id number]             PCP: Guadlupe Spanish, MD Referring: Guadlupe Spanish, MD Visit Date: 10/04/2017 Occupation: @GUAROCC @    Subjective:  bilateral knee pain    History of Present Illness: Gina Hunt is a 71 y.o. female with a history of osteoarthritis and fibromyalgia.  Patient continues to have bilateral knee pain but no swelling.  She states her knees get very stiff after sitting for long periods of time.  She is also experiencing bilateral hand pain and occasional swelling.  No wrist pain or swelling.  She states her C-spine has not been painful recently.  She does have muscle tenderness and tightness over her trapezius.  Her trigger index finger of left hand has resolved after her previous cortisone injection on 06/07/17.    She reports that over the past few weeks she has had bilateral eye pain and redness.  She has also had drainage. She has seen her eye doctor several times and was diagnosed with a viral infection.  She has tried two different eye drops without relief.  She has an appointment with her ophthalmologist tomorrow morning.     Activities of Daily Living:  Patient reports morning stiffness for 2 hours.   Patient Reports nocturnal pain.  Difficulty dressing/grooming: Denies Difficulty climbing stairs: Reports Difficulty getting out of chair: Reports Difficulty using hands for taps, buttons, cutlery, and/or writing: Denies   Review of Systems  Constitutional: Positive for fatigue. Negative for weakness.  HENT: Negative for mouth sores, mouth dryness and nose dryness.   Eyes: Positive for pain and redness. Negative for dryness.  Respiratory: Negative for cough, hemoptysis, shortness of breath and difficulty breathing.   Cardiovascular: Negative.  Negative for chest pain, palpitations, hypertension and swelling in legs/feet.  Gastrointestinal: Negative.   Negative for blood in stool, constipation and diarrhea.  Endocrine: Negative for increased urination.  Genitourinary: Negative for painful urination.  Musculoskeletal: Positive for arthralgias, joint pain, joint swelling and morning stiffness. Negative for myalgias, muscle weakness, muscle tenderness and myalgias.  Skin: Negative for color change, pallor, rash, hair loss, nodules/bumps, redness, skin tightness, ulcers and sensitivity to sunlight.  Allergic/Immunologic: Negative for susceptible to infections.  Neurological: Negative for dizziness, numbness and headaches.  Hematological: Negative for swollen glands.  Psychiatric/Behavioral: Positive for sleep disturbance. Negative for depressed mood. The patient is nervous/anxious.     PMFS History:  Patient Active Problem List   Diagnosis Date Noted  . Other fatigue 05/19/2017  . Dyslipidemia 03/08/2017  . History of peptic ulcer 03/06/2017  . History of hypothyroidism 03/06/2017  . Fibromyalgia 09/06/2016  . Primary osteoarthritis of both knees 09/06/2016  . Primary osteoarthritis of both hands 09/06/2016  . DJD (degenerative joint disease), cervical 09/06/2016  . Peptic ulcer disease 09/06/2016  . Hypothyroidism 09/06/2016    Past Medical History:  Diagnosis Date  . Fibromyalgia   . Hypothyroidism   . Osteoarthritis     Family History  Problem Relation Age of Onset  . Hypertension Mother   . Diabetes Mother   . Arthritis Mother   . Rheum arthritis Father   . Diabetes Father   . Heart disease Father   . Cancer Brother        stomach    Past Surgical  History:  Procedure Laterality Date  . ABDOMINAL HYSTERECTOMY    . LIVER SURGERY    . TONSILLECTOMY     Social History   Social History Narrative  . Not on file     Objective: Vital Signs: BP 128/86 (BP Location: Left Arm, Patient Position: Sitting, Cuff Size: Normal)   Pulse 64   Resp 14   Ht 5\' 1"  (1.549 m)   Wt 143 lb (64.9 kg)   BMI 27.02 kg/m     Physical Exam  Constitutional: She is oriented to person, place, and time. She appears well-developed and well-nourished.  HENT:  Head: Normocephalic and atraumatic.  Eyes: EOM are normal. Right eye exhibits discharge. Left eye exhibits discharge.  Conjunctiva: erythematous with drainage bilaterally    Neck: Normal range of motion.  Cardiovascular: Normal rate, regular rhythm, normal heart sounds and intact distal pulses.  Pulmonary/Chest: Effort normal and breath sounds normal.  Abdominal: Soft. Bowel sounds are normal.  Lymphadenopathy:    She has no cervical adenopathy.  Neurological: She is alert and oriented to person, place, and time.  Skin: Skin is warm and dry. Capillary refill takes less than 2 seconds.  Psychiatric: She has a normal mood and affect. Her behavior is normal.  Nursing note and vitals reviewed.    Musculoskeletal Exam: C-spine ROM limited. Tenderness over trapezius.  Thoracic and lumbar spine good ROM.  Shoulder joints, elbow joints, and wrist joints good ROM with no synovitis.  MCPs, PIPs, and DIPs good ROM with no synovitis.  PIP and DIP synovial thickening.  CMC thickening and tenderness.  Hip joints, knee joints, and ankle joints good ROM.  Discomfort with flexion and extension of bilateral knee joints.  Discomfort with inversion of right ankle.  Left greater trochanteric bursitis.  MTPs, PIPs, and DIPs good ROM with no synovitis.  Generalized hyperalgesia.    CDAI Exam: No CDAI exam completed.    Investigation: No additional findings.   Imaging: No results found.  Speciality Comments: No specialty comments available.    Procedures:  No procedures performed Allergies: Codeine and Morphine and related   Assessment / Plan:     Visit Diagnoses: Fibromyalgia: Patient has generalized pain and positive tender points.  Discussed the need for regular exercise and possibly getting a massage.  Discussed good sleep hygiene.  Patient is on Gabapentin, Utram,  Fentanyl, and Lexapro.  Patient does not need any refills.  She is going to work on [employer].          Primary osteoarthritis of both hands: PIP and DIP synovial thickening.  No synovitis.  Discomfort of CMC joints bilaterally.  Joint protection and muscle strengthening discussed.   Primary osteoarthritis of both knees: No effusion on exam.  Full ROM with discomfort.  She uses Voltaren gel.    DDD (degenerative disc disease), cervical: Limited ROM on exam.  Stable and not causing much discomfort currently.    Trigger index finger of left hand: Patient's trigger finger has resolved since his trigger finger cortisone injection on 06/07/17.    Other medical conditions are listed as follows:   Dyslipidemia  History of hypothyroidism  History of peptic ulcer    Orders: No orders of the defined types were placed in this encounter.  No orders of the defined types were placed in this encounter.     Follow-Up Instructions: Return in about 6 months (around 04/04/2018) for Fibromyalgia, Osteoarthritis.   Note - This record has been created using [employer].  Chart  creation errors have been sought, but may not always  have been located. Such creation errors do not reflect on  the standard of medical care.

## 2017-09-25 DIAGNOSIS — H16213 Exposure keratoconjunctivitis, bilateral: Secondary | ICD-10-CM | POA: Diagnosis not present

## 2017-10-03 DIAGNOSIS — Z79899 Other long term (current) drug therapy: Secondary | ICD-10-CM | POA: Diagnosis not present

## 2017-10-03 DIAGNOSIS — M5136 Other intervertebral disc degeneration, lumbar region: Secondary | ICD-10-CM | POA: Diagnosis not present

## 2017-10-03 DIAGNOSIS — M545 Low back pain: Secondary | ICD-10-CM | POA: Diagnosis not present

## 2017-10-04 ENCOUNTER — Ambulatory Visit: Payer: Medicare HMO | Admitting: Rheumatology

## 2017-10-04 ENCOUNTER — Encounter: Payer: Self-pay | Admitting: Rheumatology

## 2017-10-04 VITALS — BP 128/86 | HR 64 | Resp 14 | Ht 61.0 in | Wt 143.0 lb

## 2017-10-04 DIAGNOSIS — M65322 Trigger finger, left index finger: Secondary | ICD-10-CM | POA: Diagnosis not present

## 2017-10-04 DIAGNOSIS — M19041 Primary osteoarthritis, right hand: Secondary | ICD-10-CM | POA: Diagnosis not present

## 2017-10-04 DIAGNOSIS — M797 Fibromyalgia: Secondary | ICD-10-CM

## 2017-10-04 DIAGNOSIS — M170 Bilateral primary osteoarthritis of knee: Secondary | ICD-10-CM

## 2017-10-04 DIAGNOSIS — M5030 Other cervical disc degeneration, unspecified cervical region: Secondary | ICD-10-CM | POA: Diagnosis not present

## 2017-10-04 DIAGNOSIS — M19042 Primary osteoarthritis, left hand: Secondary | ICD-10-CM | POA: Diagnosis not present

## 2017-10-04 DIAGNOSIS — Z8639 Personal history of other endocrine, nutritional and metabolic disease: Secondary | ICD-10-CM | POA: Diagnosis not present

## 2017-10-04 DIAGNOSIS — E785 Hyperlipidemia, unspecified: Secondary | ICD-10-CM

## 2017-10-04 DIAGNOSIS — Z8711 Personal history of peptic ulcer disease: Secondary | ICD-10-CM

## 2017-10-05 DIAGNOSIS — H11431 Conjunctival hyperemia, right eye: Secondary | ICD-10-CM | POA: Diagnosis not present

## 2017-10-05 DIAGNOSIS — H00029 Hordeolum internum unspecified eye, unspecified eyelid: Secondary | ICD-10-CM | POA: Diagnosis not present

## 2017-10-05 DIAGNOSIS — Z961 Presence of intraocular lens: Secondary | ICD-10-CM | POA: Diagnosis not present

## 2017-10-05 DIAGNOSIS — H18413 Arcus senilis, bilateral: Secondary | ICD-10-CM | POA: Diagnosis not present

## 2017-10-12 DIAGNOSIS — H43811 Vitreous degeneration, right eye: Secondary | ICD-10-CM | POA: Diagnosis not present

## 2017-11-09 DIAGNOSIS — Z79899 Other long term (current) drug therapy: Secondary | ICD-10-CM | POA: Diagnosis not present

## 2017-11-09 DIAGNOSIS — H16222 Keratoconjunctivitis sicca, not specified as Sjogren's, left eye: Secondary | ICD-10-CM | POA: Diagnosis not present

## 2017-11-09 DIAGNOSIS — H16221 Keratoconjunctivitis sicca, not specified as Sjogren's, right eye: Secondary | ICD-10-CM | POA: Diagnosis not present

## 2017-11-09 DIAGNOSIS — M545 Low back pain: Secondary | ICD-10-CM | POA: Diagnosis not present

## 2017-11-09 DIAGNOSIS — M5136 Other intervertebral disc degeneration, lumbar region: Secondary | ICD-10-CM | POA: Diagnosis not present

## 2017-12-14 DIAGNOSIS — M25569 Pain in unspecified knee: Secondary | ICD-10-CM | POA: Diagnosis not present

## 2017-12-14 DIAGNOSIS — Z79899 Other long term (current) drug therapy: Secondary | ICD-10-CM | POA: Diagnosis not present

## 2017-12-14 DIAGNOSIS — M5136 Other intervertebral disc degeneration, lumbar region: Secondary | ICD-10-CM | POA: Diagnosis not present

## 2017-12-14 DIAGNOSIS — R69 Illness, unspecified: Secondary | ICD-10-CM | POA: Diagnosis not present

## 2017-12-14 DIAGNOSIS — M545 Low back pain: Secondary | ICD-10-CM | POA: Diagnosis not present

## 2017-12-15 DIAGNOSIS — E039 Hypothyroidism, unspecified: Secondary | ICD-10-CM | POA: Diagnosis not present

## 2017-12-15 DIAGNOSIS — E7800 Pure hypercholesterolemia, unspecified: Secondary | ICD-10-CM | POA: Diagnosis not present

## 2017-12-15 DIAGNOSIS — G894 Chronic pain syndrome: Secondary | ICD-10-CM | POA: Diagnosis not present

## 2017-12-15 DIAGNOSIS — K219 Gastro-esophageal reflux disease without esophagitis: Secondary | ICD-10-CM | POA: Diagnosis not present

## 2018-01-11 DIAGNOSIS — M545 Low back pain: Secondary | ICD-10-CM | POA: Diagnosis not present

## 2018-01-11 DIAGNOSIS — Z79899 Other long term (current) drug therapy: Secondary | ICD-10-CM | POA: Diagnosis not present

## 2018-01-11 DIAGNOSIS — M5136 Other intervertebral disc degeneration, lumbar region: Secondary | ICD-10-CM | POA: Diagnosis not present

## 2018-01-23 DIAGNOSIS — E7800 Pure hypercholesterolemia, unspecified: Secondary | ICD-10-CM | POA: Diagnosis not present

## 2018-01-23 DIAGNOSIS — Z789 Other specified health status: Secondary | ICD-10-CM | POA: Diagnosis not present

## 2018-01-23 DIAGNOSIS — E039 Hypothyroidism, unspecified: Secondary | ICD-10-CM | POA: Diagnosis not present

## 2018-01-23 DIAGNOSIS — G894 Chronic pain syndrome: Secondary | ICD-10-CM | POA: Diagnosis not present

## 2018-02-05 DIAGNOSIS — I8311 Varicose veins of right lower extremity with inflammation: Secondary | ICD-10-CM | POA: Diagnosis not present

## 2018-02-05 DIAGNOSIS — I8312 Varicose veins of left lower extremity with inflammation: Secondary | ICD-10-CM | POA: Diagnosis not present

## 2018-02-05 DIAGNOSIS — R600 Localized edema: Secondary | ICD-10-CM | POA: Diagnosis not present

## 2018-02-12 DIAGNOSIS — M545 Low back pain: Secondary | ICD-10-CM | POA: Diagnosis not present

## 2018-02-12 DIAGNOSIS — M159 Polyosteoarthritis, unspecified: Secondary | ICD-10-CM | POA: Diagnosis not present

## 2018-02-12 DIAGNOSIS — Z79899 Other long term (current) drug therapy: Secondary | ICD-10-CM | POA: Diagnosis not present

## 2018-02-12 DIAGNOSIS — M5136 Other intervertebral disc degeneration, lumbar region: Secondary | ICD-10-CM | POA: Diagnosis not present

## 2018-02-15 DIAGNOSIS — I8312 Varicose veins of left lower extremity with inflammation: Secondary | ICD-10-CM | POA: Diagnosis not present

## 2018-02-15 DIAGNOSIS — I8311 Varicose veins of right lower extremity with inflammation: Secondary | ICD-10-CM | POA: Diagnosis not present

## 2018-02-23 DIAGNOSIS — Z789 Other specified health status: Secondary | ICD-10-CM | POA: Diagnosis not present

## 2018-02-23 DIAGNOSIS — E039 Hypothyroidism, unspecified: Secondary | ICD-10-CM | POA: Diagnosis not present

## 2018-02-23 DIAGNOSIS — K219 Gastro-esophageal reflux disease without esophagitis: Secondary | ICD-10-CM | POA: Diagnosis not present

## 2018-03-01 DIAGNOSIS — H524 Presbyopia: Secondary | ICD-10-CM | POA: Diagnosis not present

## 2018-03-09 ENCOUNTER — Ambulatory Visit (INDEPENDENT_AMBULATORY_CARE_PROVIDER_SITE_OTHER): Payer: Medicare HMO | Admitting: Family Medicine

## 2018-03-09 ENCOUNTER — Encounter: Payer: Self-pay | Admitting: Family Medicine

## 2018-03-09 ENCOUNTER — Telehealth: Payer: Self-pay | Admitting: Family Medicine

## 2018-03-09 VITALS — BP 112/72 | HR 64 | Temp 98.3°F | Ht 62.0 in | Wt 134.2 lb

## 2018-03-09 DIAGNOSIS — M797 Fibromyalgia: Secondary | ICD-10-CM | POA: Diagnosis not present

## 2018-03-09 MED ORDER — DULOXETINE HCL 30 MG PO CPEP: 30.0000 mg | ORAL_CAPSULE | Freq: Every day | ORAL | 3 refills | Status: DC

## 2018-03-09 MED ORDER — DICLOFENAC SODIUM 2 % TD SOLN: TRANSDERMAL | 2 refills | Status: DC

## 2018-03-09 NOTE — Telephone Encounter (Signed)
Resent per PCP instructions Pharmacy informed

## 2018-03-09 NOTE — Progress Notes (Signed)
Chief Complaint  Patient presents with  . Establish Care       New Patient Visit SUBJECTIVE: HPI: Gina Hunt is an 72 y.o.female who is being seen for establishing care.   The patient was previously seen at Dr. Lindwood Qua office.  The patient has a history of arthritis and fibromyalgia.  She is currently on tramadol as needed.  Her pain mainly affects her shoulders and upper arms.  She tries to stay physically active.  She is also on Lexapro and gabapentin.  Lexapro is becoming too expensive and she would like an alternative.  She is never been on Lyrica, Cymbalta, or any other medicines.  Allergies  Allergen Reactions  . Codeine   . Morphine And Related     Past Medical History:  Diagnosis Date  . Fibromyalgia   . Hypothyroidism   . Osteoarthritis    Past Surgical History:  Procedure Laterality Date  . ABDOMINAL HYSTERECTOMY    . LIVER SURGERY    . TONSILLECTOMY     Family History  Problem Relation Age of Onset  . Hypertension Mother   . Diabetes Mother   . Arthritis Mother   . Rheum arthritis Father   . Diabetes Father   . Heart disease Father   . Cancer Brother        stomach    Allergies  Allergen Reactions  . Codeine   . Morphine And Related     Current Outpatient Medications:  .  Calcium Carbonate (CALCIUM 600 PO), Take 1 tablet by mouth 2 (two) times daily., Disp: , Rfl:  .  Coenzyme Q10 60 MG CAPS, Take 1 capsule by mouth 3 (three) times daily with meals., Disp: , Rfl:  .  estrogens, conjugated, (PREMARIN) 0.625 MG tablet, Take 0.625 mg by mouth., Disp: , Rfl:  .  gabapentin (NEURONTIN) 300 MG capsule, Take 300 mg by mouth 3 (three) times daily. , Disp: , Rfl:  .  levothyroxine (SYNTHROID, LEVOTHROID) 50 MCG tablet, Take 50 mcg by mouth daily before breakfast., Disp: , Rfl:  .  MAGNESIUM MALATE PO, Take 1,000 mg by mouth 3 (three) times daily with meals., Disp: , Rfl:  .  Manganese 50 MG TABS, Take 1 tablet by mouth daily with supper., Disp: , Rfl:   .  Nutritional Supplements (ESTROVEN PO), Take by mouth., Disp: , Rfl:  .  omeprazole (PRILOSEC) 40 MG capsule, Take 40 mg by mouth daily., Disp: , Rfl:  .  simvastatin (ZOCOR) 40 MG tablet, Take 40 mg by mouth daily., Disp: , Rfl:  .  thiamine (VITAMIN B-1) 100 MG tablet, Take 100 mg by mouth daily with supper., Disp: , Rfl:  .  traMADol (ULTRAM) 50 MG tablet, Take 50 mg by mouth every 6 (six) hours as needed., Disp: , Rfl:  .  Vitamin D, Cholecalciferol, 400 units CAPS, Take 1 capsule by mouth 2 (two) times daily with a meal., Disp: , Rfl:  .  Diclofenac Sodium 2 % SOLN, Apply thin layer to affected joints 4 times daily as needed for pain., Disp: 1 Bottle, Rfl: 2 .  DULoxetine (CYMBALTA) 30 MG capsule, Take 1 capsule (30 mg total) by mouth daily., Disp: 30 capsule, Rfl: 3  No LMP recorded. Patient has had a hysterectomy.  ROS Const: Denies fevers  MSK: Denies as noted in HPI   OBJECTIVE: BP 112/72 (BP Location: Left Arm, Patient Position: Sitting, Cuff Size: Normal)   Pulse 64   Temp 98.3 F (36.8 C) (Oral)  Ht 5\' 2"  (1.575 m)   Wt 134 lb 4 oz (60.9 kg)   SpO2 96%   BMI 24.55 kg/m   Constitutional: -  VS reviewed -  Well developed, well nourished, appears stated age -  No apparent distress  Psychiatric: -  Oriented to person, place, and time -  Memory intact -  Affect and mood normal -  Fluent conversation, good eye contact -  Judgment and insight age appropriate  Eye: -  Conjunctivae clear, no discharge -  Pupils symmetric, round, reactive to light  ENMT: -  MMM    Pharynx moist, no exudate, no erythema  Neck: -  No gross swelling, no palpable masses -  Thyroid midline, not enlarged, mobile, no palpable masses  Cardiovascular: -  RRR -  No bruits -  No LE edema  Respiratory: -  Normal respiratory effort, no accessory muscle use, no retraction -  Breath sounds equal, no wheezes, no ronchi, no crackles  Skin: -  No significant lesion on inspection -  Warm and dry to  palpation   ASSESSMENT/PLAN: Fibromyalgia - Plan: Diclofenac Sodium 2 % SOLN, DULoxetine (CYMBALTA) 30 MG capsule  Patient instructed to sign release of records form from her previous PCP. Replace Lexapro with Cymbalta.  Low dose given she is on tramadol as well.  Tramadol is as needed though.  Gave information for fibro-guide.  Counseled on exercise as an adjunct to treatment.  Will consider Elavil in the future if this is not helpful. Continue gabapentin for now. Patient should return in 1 mo. The patient voiced understanding and agreement to the plan.   [place], DO 03/09/18  3:13 PM

## 2018-03-09 NOTE — Telephone Encounter (Signed)
q 

## 2018-03-09 NOTE — Telephone Encounter (Signed)
1 pump over affected areas qid prn pain. TY.

## 2018-03-09 NOTE — Patient Instructions (Addendum)
[email or web address]  Replace Lexapro with Cymbalta.  Aim to do some physical exertion for 150 minutes per week. This is typically divided into 5 days per week, 30 minutes per day. The activity should be enough to get your heart rate up. Anything is better than nothing if you have time constraints. Yoga is helpful- YouTube has good videos.  Let us know if you need anything.

## 2018-03-09 NOTE — Addendum Note (Signed)
Addended by: Sharon Seller B on: 03/09/2018 04:10 PM   Modules accepted: Orders

## 2018-03-09 NOTE — Progress Notes (Signed)
Pre visit review using our clinic review tool, if applicable. No additional management support is needed unless otherwise documented below in the visit note. 

## 2018-03-09 NOTE — Telephone Encounter (Signed)
Copied from [place] [id number]. Topic: General - Other >> Mar 09, 2018  2:42 PM Oneta Rack wrote: Caller name: [facility]  Pharmacy:from [facility] [place], [place] [phone] (Phone) [phone] (Fax)  Reason for call:  Pharmacy states please revise directions for the following Rx Diclofenac Sodium 2 % SOLN, instead of "Apply thin layer to affected joints 4 times daily as needed for pain" new Rx must reflect  "Pump 1 or 2 pumps 2x daily" (2x being the max) please resend Rx reflecting the change.   >> Mar 09, 2018  2:47 PM Oneta Rack wrote: Osvaldo Human name: [facility]  Pharmacy:from [facility] [place], [place]  [phone] (Phone) [phone] (Fax)  Reason for call:  Pharmacy states please revise directions for the following Rx Diclofenac Sodium 2 % SOLN, instead of "Apply thin layer to affected joints 4 times daily as needed for pain" new Rx must reflect  "Pump 1 or 2 pumps 2x daily" (2x being the max) please resend Rx reflecting the change.

## 2018-03-22 DIAGNOSIS — I87323 Chronic venous hypertension (idiopathic) with inflammation of bilateral lower extremity: Secondary | ICD-10-CM | POA: Diagnosis not present

## 2018-03-28 NOTE — Progress Notes (Signed)
Office Visit Note  Patient: Gina Hunt             Date of Birth: 18-Feb-1946           MRN: [id number]             PCP: [name], DO Referring: Guadlupe Spanish, MD Visit Date: 04/05/2018 Occupation: @GUAROCC @    Subjective:  Trigger finger.   History of Present Illness: Gina Hunt is a 72 y.o. female with history of fibromyalgia osteoarthritis and DDD.  She states she continues to have a lot of discomfort in her bilateral hands and bilateral knee joints.  The C-spine pain is tolerable.  She states she has right ring trigger finger and left index trigger finger.  Fibromyalgia is better with the warmer better.  Activities of Daily Living:  Patient reports morning stiffness for 45 minutes.   Patient Reports nocturnal pain.  Difficulty dressing/grooming: Denies Difficulty climbing stairs: Denies Difficulty getting out of chair: Denies Difficulty using hands for taps, buttons, cutlery, and/or writing: Denies   Review of Systems  Constitutional: Positive for fatigue. Negative for night sweats, weight gain and weight loss.  HENT: Positive for mouth dryness. Negative for mouth sores, trouble swallowing, trouble swallowing and nose dryness.   Eyes: Positive for dryness. Negative for pain, redness and visual disturbance.  Respiratory: Negative for cough, shortness of breath and difficulty breathing.   Cardiovascular: Negative for chest pain, palpitations, hypertension, irregular heartbeat and swelling in legs/feet.  Gastrointestinal: Negative for blood in stool, constipation and diarrhea.  Endocrine: Negative for increased urination.  Genitourinary: Negative for vaginal dryness.  Musculoskeletal: Positive for arthralgias, joint pain, myalgias, morning stiffness and myalgias. Negative for joint swelling, muscle weakness and muscle tenderness.  Skin: Negative for color change, rash, hair loss, skin tightness, ulcers and sensitivity to sunlight.    Allergic/Immunologic: Negative for susceptible to infections.  Neurological: Negative for dizziness, memory loss, night sweats and weakness.  Hematological: Negative for swollen glands.  Psychiatric/Behavioral: Negative for depressed mood and sleep disturbance. The patient is not nervous/anxious.     PMFS History:  Patient Active Problem List   Diagnosis Date Noted  . Other fatigue 05/19/2017  . Dyslipidemia 03/08/2017  . History of peptic ulcer 03/06/2017  . History of hypothyroidism 03/06/2017  . Fibromyalgia 09/06/2016  . Primary osteoarthritis of both knees 09/06/2016  . Primary osteoarthritis of both hands 09/06/2016  . DJD (degenerative joint disease), cervical 09/06/2016  . Peptic ulcer disease 09/06/2016  . Hypothyroidism 09/06/2016    Past Medical History:  Diagnosis Date  . Fibromyalgia   . Hypothyroidism   . Osteoarthritis     Family History  Problem Relation Age of Onset  . Hypertension Mother   . Diabetes Mother   . Arthritis Mother   . Heart disease Mother   . Rheum arthritis Father   . Diabetes Father   . Heart disease Father   . Cancer Brother        stomach    Past Surgical History:  Procedure Laterality Date  . ABDOMINAL HYSTERECTOMY    . LIVER SURGERY    . TONSILLECTOMY     Social History   Social History Narrative  . Not on file     Objective: Vital Signs: BP (!) 144/84 (BP Location: Left Arm, Patient Position: Sitting, Cuff Size: Normal)   Pulse 74   Resp 15   Ht 5\' 1"  (1.549 m)   Wt 136 lb (61.7 kg)   BMI  25.70 kg/m    Physical Exam  Constitutional: She is oriented to person, place, and time. She appears well-developed and well-nourished.  HENT:  Head: Normocephalic and atraumatic.  Eyes: Conjunctivae and EOM are normal.  Neck: Normal range of motion.  Cardiovascular: Normal rate, regular rhythm, normal heart sounds and intact distal pulses.  Pulmonary/Chest: Effort normal and breath sounds normal.  Abdominal: Soft. Bowel  sounds are normal.  Lymphadenopathy:    She has no cervical adenopathy.  Neurological: She is alert and oriented to person, place, and time.  Skin: Skin is warm and dry. Capillary refill takes less than 2 seconds.  Psychiatric: She has a normal mood and affect. Her behavior is normal.  Nursing note and vitals reviewed.    Musculoskeletal Exam: C-spine limited range of motion with discomfort.  Lumbar spine with good range of motion.  Shoulder joints elbow joints wrist joints are good range of motion.  She has DIP PIP thickening in her hands, with CMC prominence and discomfort.  Hip joints but good range of motion.  She had discomfort in her bilateral knee joints without any warmth swelling or effusion.  She had left index trigger finger and right ring trigger finger with tenosynovitis.  CDAI Exam: No CDAI exam completed.    Investigation: No additional findings.   Imaging: No results found.  Speciality Comments: No specialty comments available.    Procedures:  Hand/UE Inj: R ring A1 for trigger finger on 04/05/2018 2:30 PM Indications: pain, tendon swelling and therapeutic Details: 27 G needle, ultrasound-guided volar approach Medications: 0.5 mL lidocaine 1 %; 10 mg triamcinolone acetonide 40 MG/ML Aspirate: 0 mL Immediately prior to procedure a time out was called to verify the correct patient, procedure, equipment, support staff and site/side marked as required. Patient was prepped and draped in the usual sterile fashion.     Allergies: Codeine and Morphine and related   Assessment / Plan:     Visit Diagnoses: Fibromyalgia-her fibromyalgia is better with warmer weather.  Primary osteoarthritis of both hands-she continues to have a lot of pain and discomfort in her hands.  Joint protection muscle strengthening was discussed.  Primary osteoarthritis of both knees-she has been having knee joint discomfort.  Knee joint exercises were discussed.  Use of Voltaren gel was  discussed.  DDD (degenerative disc disease), cervical-she has some stiffness in her C-spine.  Trigger index finger of left hand-she wants to hold off injection in her left hand.  Trigger ring finger of right hand-after informed consent was obtained right ring finger was injected with cortisone as described above.  History of hypothyroidism  History of peptic ulcer  Dyslipidemia    Orders: Orders Placed This Encounter  Procedures  . Hand/UE Inj   No orders of the defined types were placed in this encounter.   Face-to-face time spent with patient was 30 minutes. .50% of time was spent in counseling and coordination of care.  Follow-Up Instructions: Return in about 6 months (around 10/05/2018) for FMS, OA, DDD.   Bo Merino, MD  Note - This record has been created using [employer].  Chart creation errors have been sought, but may not always  have been located. Such creation errors do not reflect on  the standard of medical care.

## 2018-04-05 ENCOUNTER — Encounter: Payer: Self-pay | Admitting: Rheumatology

## 2018-04-05 ENCOUNTER — Ambulatory Visit: Payer: Medicare HMO | Admitting: Rheumatology

## 2018-04-05 VITALS — BP 144/84 | HR 74 | Resp 15 | Ht 61.0 in | Wt 136.0 lb

## 2018-04-05 DIAGNOSIS — M19042 Primary osteoarthritis, left hand: Secondary | ICD-10-CM | POA: Diagnosis not present

## 2018-04-05 DIAGNOSIS — E785 Hyperlipidemia, unspecified: Secondary | ICD-10-CM

## 2018-04-05 DIAGNOSIS — M65341 Trigger finger, right ring finger: Secondary | ICD-10-CM | POA: Diagnosis not present

## 2018-04-05 DIAGNOSIS — Z8639 Personal history of other endocrine, nutritional and metabolic disease: Secondary | ICD-10-CM

## 2018-04-05 DIAGNOSIS — Z8711 Personal history of peptic ulcer disease: Secondary | ICD-10-CM | POA: Diagnosis not present

## 2018-04-05 DIAGNOSIS — M65322 Trigger finger, left index finger: Secondary | ICD-10-CM | POA: Diagnosis not present

## 2018-04-05 DIAGNOSIS — M19041 Primary osteoarthritis, right hand: Secondary | ICD-10-CM

## 2018-04-05 DIAGNOSIS — M797 Fibromyalgia: Secondary | ICD-10-CM

## 2018-04-05 DIAGNOSIS — M5030 Other cervical disc degeneration, unspecified cervical region: Secondary | ICD-10-CM | POA: Diagnosis not present

## 2018-04-05 DIAGNOSIS — M170 Bilateral primary osteoarthritis of knee: Secondary | ICD-10-CM

## 2018-04-05 MED ORDER — LIDOCAINE HCL 1 % IJ SOLN
0.5000 mL | INTRAMUSCULAR | Status: AC | PRN
  Administered 2018-04-05: .5 mL

## 2018-04-05 MED ORDER — TRIAMCINOLONE ACETONIDE 40 MG/ML IJ SUSP
10.0000 mg | INTRAMUSCULAR | Status: AC | PRN
  Administered 2018-04-05: 10 mg

## 2018-04-11 ENCOUNTER — Ambulatory Visit: Payer: Medicare HMO | Admitting: Family Medicine

## 2018-04-12 ENCOUNTER — Encounter: Payer: Self-pay | Admitting: Family Medicine

## 2018-04-12 ENCOUNTER — Ambulatory Visit (INDEPENDENT_AMBULATORY_CARE_PROVIDER_SITE_OTHER): Payer: Medicare HMO | Admitting: Family Medicine

## 2018-04-12 VITALS — BP 128/80 | HR 62 | Temp 98.2°F | Ht 61.0 in | Wt 135.0 lb

## 2018-04-12 DIAGNOSIS — M797 Fibromyalgia: Secondary | ICD-10-CM | POA: Diagnosis not present

## 2018-04-12 DIAGNOSIS — J0100 Acute maxillary sinusitis, unspecified: Secondary | ICD-10-CM

## 2018-04-12 NOTE — Patient Instructions (Signed)
Let me know when you need refills and if you would like a 90 d supply or 30 days.   Continue to push fluids, practice good hand hygiene, and cover your mouth if you cough.  If you start having fevers, shaking or shortness of breath, seek immediate care.  If you start worsening or don't get better by next Th, let me know over MyChart.  Let us know if you need anything.

## 2018-04-12 NOTE — Progress Notes (Signed)
Chief Complaint  Patient presents with  . Follow-up    Subjective: Patient is a 72 y.o. female here for f/u FM.  She is also managed by Dr. Estanislado Pandy of rheumatology. Lexapro was changed to Cymbalta and she was maintained on Neurontin at last visit. Since that time, she reports pain in her hands and knees. Did receive steroid inj of trigger finger on R from rheum team. She has been using topical NSAIDs for relief also.    Duration: 1 week  Associated symptoms: sinus congestion, sinus pain, rhinorrhea, sore throat and cough Denies: itchy watery eyes, ear pain, ear drainage, wheezing, shortness of breath and fevers Treatment to date: Tylenol Sick contacts: No  ROS: Heart: Denies chest pain  Lungs: Denies SOB   Family History  Problem Relation Age of Onset  . Hypertension Mother   . Diabetes Mother   . Arthritis Mother   . Heart disease Mother   . Rheum arthritis Father   . Diabetes Father   . Heart disease Father   . Cancer Brother        stomach    Past Medical History:  Diagnosis Date  . Fibromyalgia   . Hypothyroidism   . Osteoarthritis    Allergies  Allergen Reactions  . Codeine   . Morphine And Related     Current Outpatient Medications:  .  Calcium Carbonate (CALCIUM 600 PO), Take 1 tablet by mouth 2 (two) times daily., Disp: , Rfl:  .  Coenzyme Q10 60 MG CAPS, Take 1 capsule by mouth 3 (three) times daily with meals., Disp: , Rfl:  .  Diclofenac Sodium 2 % SOLN, Apply 1 pump four times per day prn pain, Disp: 1 Bottle, Rfl: 2 .  DULoxetine (CYMBALTA) 30 MG capsule, Take 1 capsule (30 mg total) by mouth daily., Disp: 30 capsule, Rfl: 3 .  estrogens, conjugated, (PREMARIN) 0.625 MG tablet, Take 0.625 mg by mouth., Disp: , Rfl:  .  gabapentin (NEURONTIN) 300 MG capsule, Take 300 mg by mouth 3 (three) times daily. , Disp: , Rfl:  .  levothyroxine (SYNTHROID, LEVOTHROID) 50 MCG tablet, Take 50 mcg by mouth daily before breakfast., Disp: , Rfl:  .  MAGNESIUM MALATE  PO, Take 1,000 mg by mouth 3 (three) times daily with meals., Disp: , Rfl:  .  Manganese 50 MG TABS, Take 1 tablet by mouth daily with supper., Disp: , Rfl:  .  Nutritional Supplements (ESTROVEN PO), Take by mouth., Disp: , Rfl:  .  omeprazole (PRILOSEC) 40 MG capsule, Take 40 mg by mouth daily., Disp: , Rfl:  .  simvastatin (ZOCOR) 40 MG tablet, Take 40 mg by mouth daily., Disp: , Rfl:  .  thiamine (VITAMIN B-1) 100 MG tablet, Take 100 mg by mouth daily with supper., Disp: , Rfl:  .  traMADol (ULTRAM) 50 MG tablet, Take 50 mg by mouth every 6 (six) hours as needed., Disp: , Rfl:  .  Vitamin D, Cholecalciferol, 400 units CAPS, Take 1 capsule by mouth 2 (two) times daily with a meal., Disp: , Rfl:   Objective: BP 128/80 (BP Location: Left Arm, Patient Position: Sitting, Cuff Size: Normal)   Pulse 62   Temp 98.2 F (36.8 C) (Oral)   Ht 5\' 1"  (1.549 m)   Wt 135 lb (61.2 kg)   SpO2 97%   BMI 25.51 kg/m  General: Awake, appears stated age HEENT: MMM, EOMi, +TTP over max sinuses b/l, ears neg Heart: RRR, no murmurs Lungs: CTAB, no rales, wheezes  or rhonchi. No accessory muscle use Psych: Age appropriate judgment and insight, normal affect and mood  Assessment and Plan: Fibromyalgia  Acute non-recurrent maxillary sinusitis  Cont Cymbalta. Encouraged exercise. Appears to be resolving. Let us know if worsening or failure to improve. F/u in 6 mo for med check or prn. The patient voiced understanding and agreement to the plan.  B[place], DO 04/12/18  3:07 PM

## 2018-04-12 NOTE — Progress Notes (Signed)
Pre visit review using our clinic review tool, if applicable. No additional management support is needed unless otherwise documented below in the visit note. 

## 2018-04-13 ENCOUNTER — Other Ambulatory Visit: Payer: Self-pay | Admitting: Family Medicine

## 2018-04-13 MED ORDER — LEVOTHYROXINE SODIUM 50 MCG PO TABS: 50.0000 ug | ORAL_TABLET | Freq: Every day | ORAL | 0 refills | Status: DC

## 2018-04-13 MED ORDER — TRAMADOL HCL 50 MG PO TABS: 50.0000 mg | ORAL_TABLET | Freq: Four times a day (QID) | ORAL | 5 refills | Status: DC | PRN

## 2018-04-13 NOTE — Telephone Encounter (Signed)
LOV  04/12/18 Dr. Nani Ravens

## 2018-04-13 NOTE — Telephone Encounter (Signed)
Copied from [place] [id number]. Topic: Quick Communication - Rx Refill/Question >> Apr 13, 2018  9:52 AM Boyd Kerbs wrote: Medication:   levothyroxine (SYNTHROID, LEVOTHROID) 50 MCG tablet traMADol (ULTRAM) 50 MG tablet  Changed dr.  Has the patient contacted their pharmacy? No. (Agent: If no, request that the patient contact the pharmacy for the refill.) (Agent: If yes, when and what did the pharmacy advise?)  Preferred Pharmacy (with phone number or street name):   [facility], [address] [place] [address] Phone: [phone] Fax: [phone]    Agent: Please be advised that RX refills may take up to 3 business days. We ask that you follow-up with your pharmacy.

## 2018-07-10 ENCOUNTER — Telehealth: Payer: Self-pay | Admitting: Rheumatology

## 2018-07-10 NOTE — Telephone Encounter (Signed)
Phone note placed in incorrect chart. She was calling office regarding her husband and she is on dpr.

## 2018-07-10 NOTE — Telephone Encounter (Signed)
Patient called stating she was returning your call.   °

## 2018-07-24 DIAGNOSIS — F419 Anxiety disorder, unspecified: Secondary | ICD-10-CM | POA: Diagnosis not present

## 2018-07-24 DIAGNOSIS — R69 Illness, unspecified: Secondary | ICD-10-CM | POA: Diagnosis not present

## 2018-07-24 DIAGNOSIS — K219 Gastro-esophageal reflux disease without esophagitis: Secondary | ICD-10-CM | POA: Diagnosis not present

## 2018-07-24 DIAGNOSIS — K5900 Constipation, unspecified: Secondary | ICD-10-CM | POA: Diagnosis not present

## 2018-07-24 DIAGNOSIS — R32 Unspecified urinary incontinence: Secondary | ICD-10-CM | POA: Diagnosis not present

## 2018-07-24 DIAGNOSIS — G8929 Other chronic pain: Secondary | ICD-10-CM | POA: Diagnosis not present

## 2018-07-24 DIAGNOSIS — E039 Hypothyroidism, unspecified: Secondary | ICD-10-CM | POA: Diagnosis not present

## 2018-07-24 DIAGNOSIS — M797 Fibromyalgia: Secondary | ICD-10-CM | POA: Diagnosis not present

## 2018-07-24 DIAGNOSIS — Z8249 Family history of ischemic heart disease and other diseases of the circulatory system: Secondary | ICD-10-CM | POA: Diagnosis not present

## 2018-07-24 DIAGNOSIS — Z79891 Long term (current) use of opiate analgesic: Secondary | ICD-10-CM | POA: Diagnosis not present

## 2018-07-27 ENCOUNTER — Telehealth: Payer: Self-pay

## 2018-07-27 NOTE — Telephone Encounter (Signed)
ROI fax to [facility] for records

## 2018-08-20 ENCOUNTER — Ambulatory Visit: Payer: Medicare HMO

## 2018-08-23 ENCOUNTER — Encounter: Payer: Self-pay | Admitting: Family Medicine

## 2018-08-23 DIAGNOSIS — Z1231 Encounter for screening mammogram for malignant neoplasm of breast: Secondary | ICD-10-CM | POA: Diagnosis not present

## 2018-08-24 ENCOUNTER — Ambulatory Visit (INDEPENDENT_AMBULATORY_CARE_PROVIDER_SITE_OTHER): Payer: Medicare HMO

## 2018-08-24 DIAGNOSIS — Z23 Encounter for immunization: Secondary | ICD-10-CM

## 2018-09-01 ENCOUNTER — Other Ambulatory Visit: Payer: Self-pay | Admitting: Family Medicine

## 2018-09-01 DIAGNOSIS — M797 Fibromyalgia: Secondary | ICD-10-CM

## 2018-10-01 NOTE — Progress Notes (Signed)
Office Visit Note  Patient: Gina Hunt             Date of Birth: 1945/12/23           MRN: [id number]             PCP: [name], DO Referring: [name]* Visit Date: 10/11/2018 Occupation: @GUAROCC @  Subjective:  Pain in multiple joints and muscles.   History of Present Illness: Gina Hunt is a 72 y.o. female with history of fibromyalgia osteoarthritis and degenerative disc disease.  She states she has been experiencing increased pain and discomfort recently.  She has been experiencing pain in her both shoulders, both elbows, both hands , ankles and knee joints.  She notices some swelling in her ankles and her knee joints.  Her trigger fingers are better but is still have some discomfort.  She continues to have some generalized pain from fibromyalgia.  The cold weather is making her symptoms worse.  Activities of Daily Living:  Patient reports morning stiffness for 1 hour.   Patient Reports nocturnal pain.  Difficulty dressing/grooming: Denies Difficulty climbing stairs: Denies Difficulty getting out of chair: Reports Difficulty using hands for taps, buttons, cutlery, and/or writing: Reports  Review of Systems  Constitutional: Negative for fatigue, night sweats, weight gain and weight loss.  HENT: Positive for mouth dryness. Negative for mouth sores, trouble swallowing, trouble swallowing and nose dryness.   Eyes: Positive for dryness. Negative for pain, redness and visual disturbance.  Respiratory: Negative for cough, shortness of breath and difficulty breathing.   Cardiovascular: Negative for chest pain, palpitations, hypertension, irregular heartbeat and swelling in legs/feet.  Gastrointestinal: Negative for blood in stool, constipation and diarrhea.  Endocrine: Negative for increased urination.  Genitourinary: Negative for vaginal dryness.  Musculoskeletal: Positive for arthralgias, joint pain, myalgias, morning stiffness and myalgias.  Negative for joint swelling, muscle weakness and muscle tenderness.  Skin: Negative for color change, rash, hair loss, skin tightness, ulcers and sensitivity to sunlight.  Allergic/Immunologic: Negative for susceptible to infections.  Neurological: Negative for dizziness, memory loss, night sweats and weakness.  Hematological: Negative for swollen glands.  Psychiatric/Behavioral: Positive for sleep disturbance. Negative for depressed mood. The patient is nervous/anxious.     PMFS History:  Patient Active Problem List   Diagnosis Date Noted  . Other fatigue 05/19/2017  . Dyslipidemia 03/08/2017  . History of peptic ulcer 03/06/2017  . History of hypothyroidism 03/06/2017  . Fibromyalgia 09/06/2016  . Primary osteoarthritis of both knees 09/06/2016  . Primary osteoarthritis of both hands 09/06/2016  . DJD (degenerative joint disease), cervical 09/06/2016  . Peptic ulcer disease 09/06/2016  . Hypothyroidism 09/06/2016    Past Medical History:  Diagnosis Date  . Fibromyalgia   . Hypothyroidism   . Osteoarthritis     Family History  Problem Relation Age of Onset  . Hypertension Mother   . Diabetes Mother   . Arthritis Mother   . Heart disease Mother   . Rheum arthritis Father   . Diabetes Father   . Heart disease Father   . Cancer Brother        stomach    Past Surgical History:  Procedure Laterality Date  . ABDOMINAL HYSTERECTOMY    . LIVER SURGERY    . TONSILLECTOMY     Social History   Social History Narrative  . Not on file    Objective: Vital Signs: BP 138/81 (BP Location: Left Arm, Patient Position: Sitting, Cuff Size: Normal)  Pulse 71   Resp 12   Ht 5\' 1"  (1.549 m)   Wt 134 lb 12.8 oz (61.1 kg)   BMI 25.47 kg/m    Physical Exam Vitals signs and nursing note reviewed.  Constitutional:      Appearance: She is well-developed.  HENT:     Head: Normocephalic and atraumatic.  Eyes:     Conjunctiva/sclera: Conjunctivae normal.  Neck:      Musculoskeletal: Normal range of motion.  Cardiovascular:     Rate and Rhythm: Normal rate and regular rhythm.     Heart sounds: Normal heart sounds.  Pulmonary:     Effort: Pulmonary effort is normal.     Breath sounds: Normal breath sounds.  Abdominal:     General: Bowel sounds are normal.     Palpations: Abdomen is soft.  Lymphadenopathy:     Cervical: No cervical adenopathy.  Skin:    General: Skin is warm and dry.     Capillary Refill: Capillary refill takes less than 2 seconds.  Neurological:     Mental Status: She is alert and oriented to person, place, and time.  Psychiatric:        Behavior: Behavior normal.      Musculoskeletal Exam: C-spine limited range of motion.  Thoracic and lumbar spine limited range of motion.  Shoulder joints elbow joints wrist joints with good range of motion.  She has DIP and PIP thickening bilaterally.  She had no trigger finger on examination today.  Hip joints, knee joints, ankles, MTPs been good range of motion with no synovitis.  CDAI Exam: CDAI Score: Not documented Patient Global Assessment: Not documented; Provider Global Assessment: Not documented Swollen: Not documented; Tender: Not documented Joint Exam   Not documented   There is currently no information documented on the homunculus. Go to the Rheumatology activity and complete the homunculus joint exam.  Investigation: No additional findings.  Imaging: No results found.  Recent Labs: No results found for: WBC, HGB, PLT, NA, K, CL, CO2, GLUCOSE, BUN, CREATININE, BILITOT, ALKPHOS, AST, ALT, PROT, ALBUMIN, CALCIUM, GFRAA, QFTBGOLD, QFTBGOLDPLUS  Speciality Comments: No specialty comments available.  Procedures:  No procedures performed Allergies: Codeine and Morphine and related   Assessment / Plan:     Visit Diagnoses: Fibromyalgia-patient is having a flare with increased pain and discomfort and muscle pain.  She has no synovitis on examination today.  She is getting  pain management through her PCP.  Primary osteoarthritis of both hands-she has severe osteoarthritis in her hands with pain and stiffness and decreased range of motion.  Need for regular exercises was discussed.  Primary osteoarthritis of both knees-she also has osteoarthritis in her knee joints which causes discomfort.  DDD (degenerative disc disease), cervical-she has limited range of motion of cervical spine.  Chronic midline low back pain without sciatica-lumbar spine exercises were discussed.  A handout on back exercises was given.  Trigger index finger of left hand-doing better  Trigger ring finger of right hand - injected 04/05/2018-doing better  Dyslipidemia  History of hypothyroidism  History of peptic ulcer   Orders: No orders of the defined types were placed in this encounter.  No orders of the defined types were placed in this encounter.     Follow-Up Instructions: Return in about 6 months (around 04/12/2019) for FMS, Osteoarthritis.   Bo Merino, MD  Note - This record has been created using Editor, commissioning.  Chart creation errors have been sought, but may not always  have been  located. Such creation errors do not reflect on  the standard of medical care.

## 2018-10-11 ENCOUNTER — Encounter: Payer: Self-pay | Admitting: Rheumatology

## 2018-10-11 ENCOUNTER — Ambulatory Visit: Payer: Medicare HMO | Admitting: Rheumatology

## 2018-10-11 VITALS — BP 138/81 | HR 71 | Resp 12 | Ht 61.0 in | Wt 134.8 lb

## 2018-10-11 DIAGNOSIS — M5030 Other cervical disc degeneration, unspecified cervical region: Secondary | ICD-10-CM

## 2018-10-11 DIAGNOSIS — M797 Fibromyalgia: Secondary | ICD-10-CM | POA: Diagnosis not present

## 2018-10-11 DIAGNOSIS — E785 Hyperlipidemia, unspecified: Secondary | ICD-10-CM | POA: Diagnosis not present

## 2018-10-11 DIAGNOSIS — Z8711 Personal history of peptic ulcer disease: Secondary | ICD-10-CM | POA: Diagnosis not present

## 2018-10-11 DIAGNOSIS — G8929 Other chronic pain: Secondary | ICD-10-CM

## 2018-10-11 DIAGNOSIS — M19041 Primary osteoarthritis, right hand: Secondary | ICD-10-CM

## 2018-10-11 DIAGNOSIS — M65341 Trigger finger, right ring finger: Secondary | ICD-10-CM

## 2018-10-11 DIAGNOSIS — M65322 Trigger finger, left index finger: Secondary | ICD-10-CM

## 2018-10-11 DIAGNOSIS — Z8639 Personal history of other endocrine, nutritional and metabolic disease: Secondary | ICD-10-CM

## 2018-10-11 DIAGNOSIS — M545 Low back pain: Secondary | ICD-10-CM | POA: Diagnosis not present

## 2018-10-11 DIAGNOSIS — M170 Bilateral primary osteoarthritis of knee: Secondary | ICD-10-CM

## 2018-10-11 DIAGNOSIS — M19042 Primary osteoarthritis, left hand: Secondary | ICD-10-CM

## 2018-10-11 NOTE — Progress Notes (Addendum)
Subjective:   Gina Hunt is a 72 y.o. female who presents for Medicare Annual (Subsequent) preventive examination.  Review of Systems: No ROS.  Medicare Wellness Visit. Additional risk factors are reflected in the social history.  Cardiac Risk Factors include: dyslipidemia;advanced age (>57men, >51 women) Sleep patterns: no changes Home Safety/Smoke Alarms: Feels safe in home. Smoke alarms in place. Lives in one story home with husband. Ramps in place. No stairs. Walk-in shower. Grab rails in place. 2 dogs inside.  Eye- hx cataract sx 2017- Dr.Beavis. Also sees Dr.Stonecipher. Next appt Dr.Jicha in March 2020  Female:         Mammo-utd       Dexa scan- records requested       CCS- record requested    Objective:     Vitals: BP 138/80 (BP Location: Left Arm, Patient Position: Sitting, Cuff Size: Normal)   Pulse 73   Ht 5\' 1"  (1.549 m)   Wt 135 lb (61.2 kg)   SpO2 98%   BMI 25.51 kg/m   Body mass index is 25.51 kg/m.  Advanced Directives 10/12/2018  Does Patient Have a Medical Advance Directive? Yes  Type of Paramedic of [place];Living will  Does patient want to make changes to medical advance directive? No - Patient declined  Copy of [place] in Chart? No - copy requested    Tobacco Social History   Tobacco Use  Smoking Status Never Smoker  Smokeless Tobacco Never Used     Counseling given: Not Answered   Clinical Intake: Pain : No/denies pain   Past Medical History:  Diagnosis Date  . Fibromyalgia   . Hypothyroidism   . Osteoarthritis    Past Surgical History:  Procedure Laterality Date  . ABDOMINAL HYSTERECTOMY    . LIVER SURGERY    . TONSILLECTOMY     Family History  Problem Relation Age of Onset  . Hypertension Mother   . Diabetes Mother   . Arthritis Mother   . Heart disease Mother   . Rheum arthritis Father   . Diabetes Father   . Heart disease Father   . Cancer Brother        stomach      Social History   Tobacco Use  . Smoking status: Never Smoker  . Smokeless tobacco: Never Used  Substance and Sexual Activity  . Alcohol use: Yes    Comment: rarely   . Drug use: Never  . Sexual activity: Not Currently    Birth control/protection: Surgical   Outpatient Encounter Medications as of 10/12/2018  Medication Sig  . Calcium Carbonate (CALCIUM 600 PO) Take 1 tablet by mouth 2 (two) times daily.  . DULoxetine (CYMBALTA) 30 MG capsule TAKE 1 CAPSULE BY MOUTH ONCE DAILY  . gabapentin (NEURONTIN) 300 MG capsule Take 300 mg by mouth 3 (three) times daily.   Marland Kitchen levothyroxine (SYNTHROID, LEVOTHROID) 50 MCG tablet Take 1 tablet (50 mcg total) by mouth daily before breakfast.  . MAGNESIUM MALATE PO Take 1,000 mg by mouth 3 (three) times daily with meals.  . Nutritional Supplements (ESTROVEN PO) Take by mouth.  Marland Kitchen omeprazole (PRILOSEC) 40 MG capsule Take 40 mg by mouth daily.  Marland Kitchen thiamine (VITAMIN B-1) 100 MG tablet Take 100 mg by mouth daily with supper.  . traMADol (ULTRAM) 50 MG tablet Take 1 tablet (50 mg total) by mouth every 6 (six) hours as needed.  . Vitamin D, Cholecalciferol, 400 units CAPS Take 1 capsule by  mouth 2 (two) times daily with a meal.  . [DISCONTINUED] estrogens, conjugated, (PREMARIN) 0.625 MG tablet Take 0.625 mg by mouth.  . Coenzyme Q10 60 MG CAPS Take 1 capsule by mouth 3 (three) times daily with meals.  . Diclofenac Sodium 2 % SOLN Apply 1 pump four times per day prn pain (Patient not taking: Reported on 10/12/2018)  . Manganese 50 MG TABS Take 1 tablet by mouth daily with supper.  . simvastatin (ZOCOR) 40 MG tablet Take 40 mg by mouth daily.    Activities of Daily Living In your present state of health, do you have any difficulty performing the following activities: 10/12/2018  Hearing? N  Vision? N  Difficulty concentrating or making decisions? N  Walking or climbing stairs? N  Dressing or bathing? N  Doing errands, shopping? N  Preparing Food and  eating ? N  Using the Toilet? N  In the past six months, have you accidently leaked urine? Y  Managing your Medications? N  Managing your Finances? N  Housekeeping or managing your Housekeeping? N  Some recent data might be hidden    Patient Care Team: [name], DO as PCP - General (Family Medicine)    Assessment:   This is a routine wellness examination for [place]. Physical assessment deferred to PCP.  Exercise Activities and Dietary recommendations Current Exercise Habits: The patient does not participate in regular exercise at present;Home exercise routine, Type of exercise: walking, Time (Minutes): 30, Frequency (Times/Week): 3, Weekly Exercise (Minutes/Week): 90, Intensity: Mild, Exercise limited by: None identified   Diet (meal preparation, eat out, water intake, caffeinated beverages, dairy products, fruits and vegetables): well balanced, on average, 3 meals per day   Goals    . Maintain current healthy lifestyle       Depression Screen PHQ 2/9 Scores 10/12/2018  PHQ - 2 Score 0     Cognitive Function Ad8 score reviewed for issues:  Issues making decisions:no  Less interest in hobbies / activities:no  Repeats questions, stories (family complaining):no  Trouble using ordinary gadgets (microwave, computer, phone):no  Forgets the month or year: no  Mismanaging finances: no  Remembering appts:no  Daily problems with thinking and/or memory:no Ad8 score is=0         Immunization History  Administered Date(s) Administered  . Influenza, High Dose Seasonal PF 08/24/2018   Screening Tests Health Maintenance  Topic Date Due  . Hepatitis C Screening  [birth year]  . TETANUS/TDAP  01/19/1965  . COLONOSCOPY  01/20/1996  . DEXA SCAN  01/20/2011  . PNA vac Low Risk Adult (1 of 2 - PCV13) 01/20/2011  . MAMMOGRAM  08/23/2020  . INFLUENZA VACCINE  Completed       Plan:    Please schedule your next medicare wellness visit with me in 1  yr.  Continue to eat heart healthy diet (full of fruits, vegetables, whole grains, lean protein, water--limit salt, fat, and sugar intake) and increase physical activity as tolerated.  Continue doing brain stimulating activities (puzzles, reading, adult coloring books, staying active) to keep memory sharp.   Bring a copy of your living will and/or healthcare power of attorney to your next office visit.    I have personally reviewed and noted the following in the patient's chart:   . Medical and social history . Use of alcohol, tobacco or illicit drugs  . Current medications and supplements . Functional ability and status . Nutritional status . Physical activity . Advanced directives . List of other physicians .  Hospitalizations, surgeries, and ER visits in previous 12 months . Vitals . Screenings to include cognitive, depression, and falls . Referrals and appointments  In addition, I have reviewed and discussed with patient certain preventive protocols, quality metrics, and best practice recommendations. A written personalized care plan for preventive services as well as general preventive health recommendations were provided to patient.     Shela Nevin, [place]  10/12/2018

## 2018-10-11 NOTE — Patient Instructions (Signed)

## 2018-10-12 ENCOUNTER — Ambulatory Visit (INDEPENDENT_AMBULATORY_CARE_PROVIDER_SITE_OTHER): Payer: Medicare HMO | Admitting: Family Medicine

## 2018-10-12 ENCOUNTER — Encounter: Payer: Self-pay | Admitting: *Deleted

## 2018-10-12 ENCOUNTER — Ambulatory Visit (INDEPENDENT_AMBULATORY_CARE_PROVIDER_SITE_OTHER): Payer: Medicare HMO | Admitting: *Deleted

## 2018-10-12 ENCOUNTER — Encounter: Payer: Self-pay | Admitting: Family Medicine

## 2018-10-12 VITALS — BP 138/80 | HR 73 | Ht 61.0 in | Wt 135.0 lb

## 2018-10-12 DIAGNOSIS — E348 Other specified endocrine disorders: Secondary | ICD-10-CM | POA: Diagnosis not present

## 2018-10-12 DIAGNOSIS — Z0000 Encounter for general adult medical examination without abnormal findings: Secondary | ICD-10-CM

## 2018-10-12 DIAGNOSIS — K635 Polyp of colon: Secondary | ICD-10-CM | POA: Diagnosis not present

## 2018-10-12 DIAGNOSIS — N393 Stress incontinence (female) (male): Secondary | ICD-10-CM | POA: Diagnosis not present

## 2018-10-12 DIAGNOSIS — Z1159 Encounter for screening for other viral diseases: Secondary | ICD-10-CM

## 2018-10-12 DIAGNOSIS — E785 Hyperlipidemia, unspecified: Secondary | ICD-10-CM | POA: Diagnosis not present

## 2018-10-12 DIAGNOSIS — M797 Fibromyalgia: Secondary | ICD-10-CM

## 2018-10-12 MED ORDER — GABAPENTIN 300 MG PO CAPS: 300.0000 mg | ORAL_CAPSULE | Freq: Three times a day (TID) | ORAL | 1 refills | Status: DC

## 2018-10-12 MED ORDER — AMITRIPTYLINE HCL 25 MG PO TABS: 25.0000 mg | ORAL_TABLET | Freq: Every day | ORAL | 3 refills | Status: DC

## 2018-10-12 MED ORDER — DULOXETINE HCL 60 MG PO CPEP: 60.0000 mg | ORAL_CAPSULE | Freq: Every day | ORAL | 3 refills | Status: DC

## 2018-10-12 NOTE — Patient Instructions (Addendum)
Consider Tai Chi and/or yoga.  Take 2 tabs of Cymbalta until you run out. A new dose has been called in.  If you do not hear anything about your referral in the next 1-2 weeks, call our office and ask for an update.  Keep the diet clean and stay active.  Let us know if you need anything.  Kegel Exercises Kegel exercises help strengthen the muscles that support the rectum, vagina, small intestine, bladder, and uterus. Doing Kegel exercises can help:  Improve bladder and bowel control.  Improve sexual response.  Reduce problems and discomfort during pregnancy.  Kegel exercises involve squeezing your pelvic floor muscles, which are the same muscles you squeeze when you try to stop the flow of urine. The exercises can be done while sitting, standing, or lying down, but it is best to vary your position. Phase 1 exercises 1. Squeeze your pelvic floor muscles tight. You should feel a tight lift in your rectal area. If you are a female, you should also feel a tightness in your vaginal area. Keep your stomach, buttocks, and legs relaxed. 2. Hold the muscles tight for up to 10 seconds. 3. Relax your muscles. Repeat this exercise 50 times a day or as many times as told by your health care provider. Continue to do this exercise for at least 4-6 weeks or for as long as told by your health care provider. This information is not intended to replace advice given to you by your health care provider. Make sure you discuss any questions you have with your health care provider. Document Released: 10/03/2012 Document Revised: 06/11/2016 Document Reviewed: 09/06/2015 Elsevier Interactive Patient Education  [employer].

## 2018-10-12 NOTE — Patient Instructions (Signed)
Please schedule your next medicare wellness visit with me in 1 yr.  Continue to eat heart healthy diet (full of fruits, vegetables, whole grains, lean protein, water--limit salt, fat, and sugar intake) and increase physical activity as tolerated.  Continue doing brain stimulating activities (puzzles, reading, adult coloring books, staying active) to keep memory sharp.   Bring a copy of your living will and/or healthcare power of attorney to your next office visit.   Gina Hunt , Thank you for taking time to come for your Medicare Wellness Visit. I appreciate your ongoing commitment to your health goals. Please review the following plan we discussed and let me know if I can assist you in the future.   These are the goals we discussed: Goals    . Maintain current healthy lifestyle       This is a list of the screening recommended for you and due dates:  Health Maintenance  Topic Date Due  .  Hepatitis C: One time screening is recommended by Center for Disease Control  (CDC) for  adults born from [birth year] through 1965.   12/20/1945  . Tetanus Vaccine  01/19/1965  . Colon Cancer Screening  01/20/1996  . DEXA scan (bone density measurement)  01/20/2011  . Pneumonia vaccines (1 of 2 - PCV13) 01/20/2011  . Mammogram  08/23/2020  . Flu Shot  Completed    Health Maintenance for Postmenopausal Women Menopause is a normal process in which your reproductive ability comes to an end. This process happens gradually over a span of months to years, usually between the ages of 11 and 40. Menopause is complete when you have missed 12 consecutive menstrual periods. It is important to talk with your health care provider about some of the most common conditions that affect postmenopausal women, such as heart disease, cancer, and bone loss (osteoporosis). Adopting a healthy lifestyle and getting preventive care can help to promote your health and wellness. Those actions can also lower your chances of developing  some of these common conditions. What should I know about menopause? During menopause, you may experience a number of symptoms, such as:  Moderate-to-severe hot flashes.  Night sweats.  Decrease in sex drive.  Mood swings.  Headaches.  Tiredness.  Irritability.  Memory problems.  Insomnia.  Choosing to treat or not to treat menopausal changes is an individual decision that you make with your health care provider. What should I know about hormone replacement therapy and supplements? Hormone therapy products are effective for treating symptoms that are associated with menopause, such as hot flashes and night sweats. Hormone replacement carries certain risks, especially as you become older. If you are thinking about using estrogen or estrogen with progestin treatments, discuss the benefits and risks with your health care provider. What should I know about heart disease and stroke? Heart disease, heart attack, and stroke become more likely as you age. This may be due, in part, to the hormonal changes that your body experiences during menopause. These can affect how your body processes dietary fats, triglycerides, and cholesterol. Heart attack and stroke are both medical emergencies. There are many things that you can do to help prevent heart disease and stroke:  Have your blood pressure checked at least every 1-2 years. High blood pressure causes heart disease and increases the risk of stroke.  If you are 72-49 years old, ask your health care provider if you should take aspirin to prevent a heart attack or a stroke.  Do not use any tobacco  products, including cigarettes, chewing tobacco, or electronic cigarettes. If you need help quitting, ask your health care provider.  It is important to eat a healthy diet and maintain a healthy weight. ? Be sure to include plenty of vegetables, fruits, low-fat dairy products, and lean protein. ? Avoid eating foods that are high in solid fats,  added sugars, or salt (sodium).  Get regular exercise. This is one of the most important things that you can do for your health. ? Try to exercise for at least 150 minutes each week. The type of exercise that you do should increase your heart rate and make you sweat. This is known as moderate-intensity exercise. ? Try to do strengthening exercises at least twice each week. Do these in addition to the moderate-intensity exercise.  Know your numbers.Ask your health care provider to check your cholesterol and your blood glucose. Continue to have your blood tested as directed by your health care provider.  What should I know about cancer screening? There are several types of cancer. Take the following steps to reduce your risk and to catch any cancer development as early as possible. Breast Cancer  Practice breast self-awareness. ? This means understanding how your breasts normally appear and feel. ? It also means doing regular breast self-exams. Let your health care provider know about any changes, no matter how small.  If you are 49 or older, have a clinician do a breast exam (clinical breast exam or CBE) every year. Depending on your age, family history, and medical history, it may be recommended that you also have a yearly breast X-ray (mammogram).  If you have a family history of breast cancer, talk with your health care provider about genetic screening.  If you are at high risk for breast cancer, talk with your health care provider about having an MRI and a mammogram every year.  Breast cancer (BRCA) gene test is recommended for women who have family members with BRCA-related cancers. Results of the assessment will determine the need for genetic counseling and BRCA1 and for BRCA2 testing. BRCA-related cancers include these types: ? Breast. This occurs in males or females. ? Ovarian. ? Tubal. This may also be called fallopian tube cancer. ? Cancer of the abdominal or pelvic lining  (peritoneal cancer). ? Prostate. ? Pancreatic.  Cervical, Uterine, and Ovarian Cancer Your health care provider may recommend that you be screened regularly for cancer of the pelvic organs. These include your ovaries, uterus, and vagina. This screening involves a pelvic exam, which includes checking for microscopic changes to the surface of your cervix (Pap test).  For women ages 21-65, health care providers may recommend a pelvic exam and a Pap test every three years. For women ages 65-65, they may recommend the Pap test and pelvic exam, combined with testing for human papilloma virus (HPV), every five years. Some types of HPV increase your risk of cervical cancer. Testing for HPV may also be done on women of any age who have unclear Pap test results.  Other health care providers may not recommend any screening for nonpregnant women who are considered low risk for pelvic cancer and have no symptoms. Ask your health care provider if a screening pelvic exam is right for you.  If you have had past treatment for cervical cancer or a condition that could lead to cancer, you need Pap tests and screening for cancer for at least 20 years after your treatment. If Pap tests have been discontinued for you, your risk factors (  such as having a new sexual partner) need to be reassessed to determine if you should start having screenings again. Some women have medical problems that increase the chance of getting cervical cancer. In these cases, your health care provider may recommend that you have screening and Pap tests more often.  If you have a family history of uterine cancer or ovarian cancer, talk with your health care provider about genetic screening.  If you have vaginal bleeding after reaching menopause, tell your health care provider.  There are currently no reliable tests available to screen for ovarian cancer.  Lung Cancer Lung cancer screening is recommended for adults 56-36 years old who are at  high risk for lung cancer because of a history of smoking. A yearly low-dose CT scan of the lungs is recommended if you:  Currently smoke.  Have a history of at least 30 pack-years of smoking and you currently smoke or have quit within the past 15 years. A pack-year is smoking an average of one pack of cigarettes per day for one year.  Yearly screening should:  Continue until it has been 15 years since you quit.  Stop if you develop a health problem that would prevent you from having lung cancer treatment.  Colorectal Cancer  This type of cancer can be detected and can often be prevented.  Routine colorectal cancer screening usually begins at age 51 and continues through age 75.  If you have risk factors for colon cancer, your health care provider may recommend that you be screened at an earlier age.  If you have a family history of colorectal cancer, talk with your health care provider about genetic screening.  Your health care provider may also recommend using home test kits to check for hidden blood in your stool.  A small camera at the end of a tube can be used to examine your colon directly (sigmoidoscopy or colonoscopy). This is done to check for the earliest forms of colorectal cancer.  Direct examination of the colon should be repeated every 5-10 years until age 25. However, if early forms of precancerous polyps or small growths are found or if you have a family history or genetic risk for colorectal cancer, you may need to be screened more often.  Skin Cancer  Check your skin from head to toe regularly.  Monitor any moles. Be sure to tell your health care provider: ? About any new moles or changes in moles, especially if there is a change in a mole's shape or color. ? If you have a mole that is larger than the size of a pencil eraser.  If any of your family members has a history of skin cancer, especially at a young age, talk with your health care provider about genetic  screening.  Always use sunscreen. Apply sunscreen liberally and repeatedly throughout the day.  Whenever you are outside, protect yourself by wearing long sleeves, pants, a wide-brimmed hat, and sunglasses.  What should I know about osteoporosis? Osteoporosis is a condition in which bone destruction happens more quickly than new bone creation. After menopause, you may be at an increased risk for osteoporosis. To help prevent osteoporosis or the bone fractures that can happen because of osteoporosis, the following is recommended:  If you are 1-67 years old, get at least 1,000 mg of calcium and at least 600 mg of vitamin D per day.  If you are older than age 63 but younger than age 59, get at least 1,200 mg of  calcium and at least 600 mg of vitamin D per day.  If you are older than age 34, get at least 1,200 mg of calcium and at least 800 mg of vitamin D per day.  Smoking and excessive alcohol intake increase the risk of osteoporosis. Eat foods that are rich in calcium and vitamin D, and do weight-bearing exercises several times each week as directed by your health care provider. What should I know about how menopause affects my mental health? Depression may occur at any age, but it is more common as you become older. Common symptoms of depression include:  Low or sad mood.  Changes in sleep patterns.  Changes in appetite or eating patterns.  Feeling an overall lack of motivation or enjoyment of activities that you previously enjoyed.  Frequent crying spells.  Talk with your health care provider if you think that you are experiencing depression. What should I know about immunizations? It is important that you get and maintain your immunizations. These include:  Tetanus, diphtheria, and pertussis (Tdap) booster vaccine.  Influenza every year before the flu season begins.  Pneumonia vaccine.  Shingles vaccine.  Your health care provider may also recommend other  immunizations. This information is not intended to replace advice given to you by your health care provider. Make sure you discuss any questions you have with your health care provider. Document Released: 12/09/2005 Document Revised: 05/06/2016 Document Reviewed: 07/21/2015 Elsevier Interactive Patient Education  2018 [employer].

## 2018-10-12 NOTE — Progress Notes (Signed)
CC: F/u  Subjective: Hyperlipidemia Patient presents for Hyperlipidemia follow up. Supposed to be on Zocor.  She has not been taking it. She is adhering to a healthy diet. Exercise: active around house, walking The patient is not known to have coexisting coronary artery disease.  Patient has a history of fibromyalgia.  The winter months seem to make her pain worse.  She is on Cymbalta 30 mg daily which is helpful.  She does stay physically active and notices that her pain is much worse if she does not keep moving.  Patient has a history of stress incontinence.  Every time she coughs, sneezes, or bears down, urine will leak.  No other urinary complaint such as bleeding or pain.  No recent surgeries.  She has not tried anything so far.  ROS: GU: +incontinence Lungs: Denies SOB  Past Medical History:  Diagnosis Date  . Fibromyalgia   . Hypothyroidism   . Osteoarthritis     Objective: BP 138/80 (BP Location: Left Arm, Patient Position: Sitting, Cuff Size: Normal)   Pulse 73   Ht 5\' 1"  (1.549 m)   Wt 135 lb (61.2 kg)   SpO2 98%   BMI 25.51 kg/m  General: Awake, appears stated age HEENT: MMM Heart: RRR, no LE edema, no bruits Lungs: CTAB, no rales, wheezes or rhonchi. No accessory muscle use Psych: Age appropriate judgment and insight, normal affect and mood  Assessment and Plan: Fibromyalgia - Plan: gabapentin (NEURONTIN) 300 MG capsule, DULoxetine (CYMBALTA) 60 MG capsule  Polyp of colon, unspecified part of colon, unspecified type - Plan: Ambulatory referral to Gastroenterology  Estradiol deficiency - Plan: DG Bone Density  Hyperlipidemia, unspecified hyperlipidemia type - Plan: Comprehensive metabolic panel, Lipid panel, CANCELED: Comprehensive metabolic panel, CANCELED: Lipid panel  Encounter for hepatitis C screening test for low risk patient - Plan: Hepatitis C antibody  Stress incontinence  Increased dose of Cymbalta from 30 mg daily to 60 mg daily.  Can consider  low-dose of Elavil.  We will try to hold off on tramadol at this time.  Counseled on diet and exercise. Refer to GI for polyps. Check bone density scan. We will check lipid panel, she will likely need to go back on her simvastatin.  We will calculate her 10-year CVD risk. Check hep C antibody. Kegel exercises given for stress incontinence. F/u in 1 month. The patient voiced understanding and agreement to the plan.  [place], DO 10/12/18  2:59 PM

## 2018-10-12 NOTE — Progress Notes (Signed)
Noted. Agree with above.  [place], DO 10/12/18 2:57 PM

## 2018-10-13 ENCOUNTER — Other Ambulatory Visit: Payer: Self-pay | Admitting: Family Medicine

## 2018-10-13 LAB — COMPREHENSIVE METABOLIC PANEL
AG Ratio: 1.2 (calc) (ref 1.0–2.5)
ALT: 13 U/L (ref 6–29)
AST: 15 U/L (ref 10–35)
Albumin: 4.2 g/dL (ref 3.6–5.1)
Alkaline phosphatase (APISO): 104 U/L (ref 33–130)
BUN: 14 mg/dL (ref 7–25)
CO2: 28 mmol/L (ref 20–32)
Calcium: 9.2 mg/dL (ref 8.6–10.4)
Chloride: 105 mmol/L (ref 98–110)
Creat: 0.65 mg/dL (ref 0.60–0.93)
Globulin: 3.4 g/dL (calc) (ref 1.9–3.7)
Glucose, Bld: 91 mg/dL (ref 65–99)
Potassium: 4.8 mmol/L (ref 3.5–5.3)
Sodium: 142 mmol/L (ref 135–146)
Total Bilirubin: 0.5 mg/dL (ref 0.2–1.2)
Total Protein: 7.6 g/dL (ref 6.1–8.1)

## 2018-10-13 LAB — LIPID PANEL
Cholesterol: 203 mg/dL — ABNORMAL HIGH (ref <200)
HDL: 40 mg/dL — ABNORMAL LOW (ref >50)
LDL Cholesterol (Calc): 131 mg/dL (calc) — ABNORMAL HIGH
Non-HDL Cholesterol (Calc): 163 mg/dL (calc) — ABNORMAL HIGH (ref <130)
Total CHOL/HDL Ratio: 5.1 (calc) — ABNORMAL HIGH (ref <5.0)
Triglycerides: 181 mg/dL — ABNORMAL HIGH (ref <150)

## 2018-10-13 LAB — HEPATITIS C ANTIBODY
Hepatitis C Ab: NONREACTIVE
SIGNAL TO CUT-OFF: 0.02 (ref <1.00)

## 2018-10-13 MED ORDER — SIMVASTATIN 40 MG PO TABS
40.0000 mg | ORAL_TABLET | Freq: Every day | ORAL | 3 refills | Status: DC
Start: 2018-10-13 — End: 2019-11-27

## 2018-10-15 ENCOUNTER — Other Ambulatory Visit: Payer: Self-pay | Admitting: Family Medicine

## 2018-11-05 ENCOUNTER — Telehealth: Payer: Self-pay | Admitting: *Deleted

## 2018-11-05 NOTE — Telephone Encounter (Signed)
Received Medical records from [facility]; forwarded to provider/SLS 01/06

## 2018-11-07 ENCOUNTER — Encounter: Payer: Self-pay | Admitting: Family Medicine

## 2018-11-12 ENCOUNTER — Ambulatory Visit (INDEPENDENT_AMBULATORY_CARE_PROVIDER_SITE_OTHER): Payer: Medicare HMO | Admitting: Family Medicine

## 2018-11-12 ENCOUNTER — Encounter: Payer: Self-pay | Admitting: Family Medicine

## 2018-11-12 VITALS — BP 126/58 | HR 67 | Temp 98.1°F | Ht 61.0 in | Wt 137.0 lb

## 2018-11-12 DIAGNOSIS — Z23 Encounter for immunization: Secondary | ICD-10-CM | POA: Diagnosis not present

## 2018-11-12 DIAGNOSIS — E785 Hyperlipidemia, unspecified: Secondary | ICD-10-CM | POA: Diagnosis not present

## 2018-11-12 DIAGNOSIS — M79641 Pain in right hand: Secondary | ICD-10-CM | POA: Diagnosis not present

## 2018-11-12 DIAGNOSIS — J309 Allergic rhinitis, unspecified: Secondary | ICD-10-CM | POA: Diagnosis not present

## 2018-11-12 DIAGNOSIS — N393 Stress incontinence (female) (male): Secondary | ICD-10-CM

## 2018-11-12 NOTE — Addendum Note (Signed)
Addended by: Marrion Coy on: 11/12/2018 02:08 PM   Modules accepted: Orders

## 2018-11-12 NOTE — Patient Instructions (Addendum)
Give [place] 2-3 business days to get the results of your labs back.   Keep the diet clean and stay active.  Claritin (loratadine), Allegra (fexofenadine), Zyrtec (cetirizine); these are listed in order from weakest to strongest. Generic, and therefore cheaper, options are in the parentheses.   There are available OTC, and the generic versions, which may be cheaper, are in parentheses. Show this to a pharmacist if you have trouble finding any of these items.  If you do not hear anything about your referral in the next 1-2 weeks, call our office and ask for an update.  Let us know if you need anything.

## 2018-11-12 NOTE — Progress Notes (Signed)
Chief Complaint  Patient presents with  . Follow-up    Subjective: Patient is a 73 y.o. female here for f/u hyperlipidemia.  Started back on simvastatin. Reports doing fair overall. Diet is healthy, stays active or her FM will flare. Doing some walking. No AE's.   Also given Kegel exercises for stress incontinence. Reports improvement, around 40%.   Duration: 2 weeks  Associated symptoms: sinus congestion and rhinorrhea Denies: itchy watery eyes, ear pain, ear drainage, sore throat, wheezing, shortness of breath, myalgia and fevers Treatment to date: Tylenol Sick contacts: Yes- Husband   ROS: Heart: Denies chest pain  Lungs: Denies SOB   Past Medical History:  Diagnosis Date  . Fibromyalgia   . Hypothyroidism   . Osteoarthritis     Objective: BP (!) 126/58 (BP Location: Left Arm, Patient Position: Sitting, Cuff Size: Normal)   Pulse 67   Temp 98.1 F (36.7 C) (Oral)   Ht 5\' 1"  (1.549 m)   Wt 137 lb (62.1 kg)   SpO2 98%   BMI 25.89 kg/m  General: Awake, appears stated age HEENT: MMM, EOMi Heart: RRR Lungs: CTAB, no rales, wheezes or rhonchi. No accessory muscle use Psych: Age appropriate judgment and insight, normal affect and mood  Assessment and Plan: Hyperlipidemia, unspecified hyperlipidemia type - Plan: Lipid panel  Stress incontinence  Allergic rhinitis, unspecified seasonality, unspecified trigger  Pain of right hand - Plan: Ambulatory referral to Sports Medicine  Orders as above. Cont statin. Ck lipids. Counseled on diet and exercise. Cont Kegel's. OTC antihistamines rec'd.  Wrist splint, refer to Dr. Barbaraann Barthel. PCV23 today. F/u in 6 mo or prn.  The patient voiced understanding and agreement to the plan.  [place], DO 11/12/18  1:55 PM

## 2018-11-13 LAB — LIPID PANEL
Cholesterol: 151 mg/dL (ref 0–200)
HDL: 42.9 mg/dL
NonHDL: 108.52
Total CHOL/HDL Ratio: 4
Triglycerides: 219 mg/dL — ABNORMAL HIGH (ref 0.0–149.0)
VLDL: 43.8 mg/dL — ABNORMAL HIGH (ref 0.0–40.0)

## 2018-11-13 LAB — LDL CHOLESTEROL, DIRECT: Direct LDL: 79 mg/dL

## 2018-11-23 ENCOUNTER — Ambulatory Visit: Payer: Medicare HMO | Admitting: Family Medicine

## 2018-11-23 ENCOUNTER — Encounter: Payer: Self-pay | Admitting: Family Medicine

## 2018-11-23 VITALS — BP 143/89 | HR 72 | Ht 61.0 in | Wt 134.0 lb

## 2018-11-23 DIAGNOSIS — M25531 Pain in right wrist: Secondary | ICD-10-CM

## 2018-11-23 MED ORDER — PREDNISONE 10 MG PO TABS: ORAL_TABLET | ORAL | 0 refills | Status: DC

## 2018-11-23 NOTE — Patient Instructions (Signed)
You have carpal tunnel syndrome though some compensatory pain into your wrist, fingers with this as well. Wear the wrist brace at nighttime and as often as possible during the day Prednisone dose pack x 6 days. Consider topical biofreeze, aspercreme in addition to this. Corticosteroid injection is a consideration to help with pain and inflammation Follow up with me in 5-6 weeks.

## 2018-11-23 NOTE — Progress Notes (Signed)
PCP and consultation requested by: Gina Pal, DO  Subjective:   HPI: Patient is a 73 y.o. female here for right hand pain.  Patient reports she's had about 3 months of worsening pain in right wrist and hand. No acute injury or trauma. Started about dorsal right wrist and has spread into palm of hand, to digits including 3rd and 4th digits. Difficulty gripping items. Associated burning/hot feeling throughout this and numbness as well. Feels like her 4th digit is catching. Pain level up to 9/10 and sharp. Has recently started wearing wrist brace with mild benefit - inconsistently wearing at night. voltaren gel has not helped. No skin changes.  Past Medical History:  Diagnosis Date  . Fibromyalgia   . Hypothyroidism   . Osteoarthritis     Current Outpatient Medications on File Prior to Visit  Medication Sig Dispense Refill  . amitriptyline (ELAVIL) 25 MG tablet Take 1 tablet (25 mg total) by mouth at bedtime. 30 tablet 3  . Calcium Carbonate (CALCIUM 600 PO) Take 1 tablet by mouth 2 (two) times daily.    . Coenzyme Q10 60 MG CAPS Take 1 capsule by mouth 3 (three) times daily with meals.    . Diclofenac Sodium 2 % SOLN Apply 1 pump four times per day prn pain 1 Bottle 2  . DULoxetine (CYMBALTA) 60 MG capsule Take 1 capsule (60 mg total) by mouth daily. 30 capsule 3  . gabapentin (NEURONTIN) 300 MG capsule Take 1 capsule (300 mg total) by mouth 3 (three) times daily. 270 capsule 1  . levothyroxine (SYNTHROID, LEVOTHROID) 50 MCG tablet TAKE 1 TABLET DAILY BEFORE BREAKFAST 90 tablet 0  . MAGNESIUM MALATE PO Take 1,000 mg by mouth 3 (three) times daily with meals.    . Manganese 50 MG TABS Take 1 tablet by mouth daily with supper.    . Nutritional Supplements (ESTROVEN PO) Take by mouth.    Marland Kitchen omeprazole (PRILOSEC) 40 MG capsule Take 40 mg by mouth daily.    . simvastatin (ZOCOR) 40 MG tablet Take 1 tablet (40 mg total) by mouth daily. 90 tablet 3  . thiamine (VITAMIN B-1)  100 MG tablet Take 100 mg by mouth daily with supper.    . Vitamin D, Cholecalciferol, 400 units CAPS Take 1 capsule by mouth 2 (two) times daily with a meal.     No current facility-administered medications on file prior to visit.     Past Surgical History:  Procedure Laterality Date  . ABDOMINAL HYSTERECTOMY    . LIVER SURGERY    . TONSILLECTOMY      Allergies  Allergen Reactions  . Codeine   . Morphine And Related     Social History   Socioeconomic History  . Marital status: Unknown    Spouse name: Not on file  . Number of children: Not on file  . Years of education: Not on file  . Highest education level: Not on file  Occupational History  . Not on file  Social Needs  . Financial resource strain: Not on file  . Food insecurity:    Worry: Not on file    Inability: Not on file  . Transportation needs:    Medical: Not on file    Non-medical: Not on file  Tobacco Use  . Smoking status: Never Smoker  . Smokeless tobacco: Never Used  Substance and Sexual Activity  . Alcohol use: Yes    Comment: rarely   . Drug use: Never  . Sexual activity:  Not Currently    Birth control/protection: Surgical  Lifestyle  . Physical activity:    Days per week: Not on file    Minutes per session: Not on file  . Stress: Not on file  Relationships  . Social connections:    Talks on phone: Not on file    Gets together: Not on file    Attends religious service: Not on file    Active member of club or organization: Not on file    Attends meetings of clubs or organizations: Not on file    Relationship status: Not on file  . Intimate partner violence:    Fear of current or ex partner: Not on file    Emotionally abused: Not on file    Physically abused: Not on file    Forced sexual activity: Not on file  Other Topics Concern  . Not on file  Social History Narrative  . Not on file    Family History  Problem Relation Age of Onset  . Hypertension Mother   . Diabetes Mother    . Arthritis Mother   . Heart disease Mother   . Rheum arthritis Father   . Diabetes Father   . Heart disease Father   . Cancer Brother        stomach     BP (!) 143/89   Pulse 72   Ht 5\' 1"  (1.549 m)   Wt 134 lb (60.8 kg)   BMI 25.32 kg/m   Review of Systems: See HPI above.     Objective:  Physical Exam:  Gen: NAD, comfortable in exam room  Right wrist/hand: Heberdens and bouchards nodes throughout.  No synovitis of wrist.  No focal swelling, bruising, malrotation or angulation. FROM digits with 4/5 strength finger abduction, thumb opposition, 5/5 finger extension.  Flexion/extension of wrist 15 degrees. Diffuse tenderness including dorsally over wrist joint, carpal tunnel, 1st dorsal compartment, 1st CMC, over flexor tendons in hand. Sensation intact to light touch. + tinels.  + finkelsteins.  Left wrist/hand: No deformity. FROM with 5/5 strength. No tenderness to palpation. NVI distally.  MSK u/s right wrist/hand:  No tenosynovitis flexor or extensor tendons, no catching of 3rd/4th digits at A1 pulley visualized.  No wrist joint effusion. Median nerve volume elevated at 0.13cm2   Assessment & Plan:  1. Right wrist/hand pain - difficult to localize pain - diffuse tenderness without concerning findings on exam.  Ultrasound also reassuring though median nerve volume elevated suggesting carpal tunnel syndrome as primary diagnosis with compensatory pain at wrist joint.  Discussed options - she will try prednisone dose pack, reports she cannot take NSAIDs.  Wrist brace at nighttime and as often as possible during the day.  If not improving consider injection.  F/u in 5-6 weeks.

## 2018-12-04 ENCOUNTER — Ambulatory Visit: Payer: Medicare HMO | Admitting: Rheumatology

## 2018-12-04 DIAGNOSIS — M65341 Trigger finger, right ring finger: Secondary | ICD-10-CM

## 2018-12-04 MED ORDER — TRIAMCINOLONE ACETONIDE 40 MG/ML IJ SUSP
10.0000 mg | INTRAMUSCULAR | Status: AC | PRN
  Administered 2018-12-04: 10 mg

## 2018-12-04 MED ORDER — LIDOCAINE HCL 1 % IJ SOLN
0.5000 mL | INTRAMUSCULAR | Status: AC | PRN
  Administered 2018-12-04: .5 mL

## 2018-12-04 NOTE — Progress Notes (Signed)
   Procedure Note  Patient: Gina Hunt             Date of Birth: [birth year]           MRN: [id number]             Visit Date: 12/04/2018  Procedures: Visit Diagnoses: Trigger ring finger of right hand Postprocedure precautions were discussed.  A finger splint was applied. Hand/UE Inj: R ring A1 for trigger finger on 12/04/2018 10:15 AM Indications: pain, tendon swelling and therapeutic Details: 27 G needle, ultrasound-guided volar approach Medications: 0.5 mL lidocaine 1 %; 10 mg triamcinolone acetonide 40 MG/ML Aspirate: 0 mL Consent was given by the patient. Immediately prior to procedure a time out was called to verify the correct patient, procedure, equipment, support staff and site/side marked as required. Patient was prepped and draped in the usual sterile fashion.    Bo Merino, MD

## 2018-12-28 ENCOUNTER — Ambulatory Visit: Payer: Medicare HMO | Admitting: Family Medicine

## 2018-12-28 ENCOUNTER — Encounter: Payer: Self-pay | Admitting: Family Medicine

## 2018-12-28 VITALS — BP 154/90 | HR 75 | Ht 62.0 in | Wt 134.0 lb

## 2018-12-28 DIAGNOSIS — M25531 Pain in right wrist: Secondary | ICD-10-CM | POA: Diagnosis not present

## 2018-12-28 NOTE — Patient Instructions (Signed)
You have carpal tunnel syndrome though also have triggering of your ring and index fingers. Continue with the wrist brace at nighttime and as often as possible during the day Consider topical biofreeze, aspercreme in addition to this. Consider injection into carpal tunnel as next step - call me if you don't continue to improve and want to do this. Try the band-aid trick for the trigger fingers especially when you sleep. Follow up with me in 6 weeks.

## 2019-01-02 ENCOUNTER — Encounter: Payer: Self-pay | Admitting: Family Medicine

## 2019-01-02 NOTE — Progress Notes (Signed)
PCP and consultation requested by: [name], DO  Subjective:   HPI: Patient is a 73 y.o. female here for right hand pain.  1/24: Patient reports she's had about 3 months of worsening pain in right wrist and hand. No acute injury or trauma. Started about dorsal right wrist and has spread into palm of hand, to digits including 3rd and 4th digits. Difficulty gripping items. Associated burning/hot feeling throughout this and numbness as well. Feels like her 4th digit is catching. Pain level up to 9/10 and sharp. Has recently started wearing wrist brace with mild benefit - inconsistently wearing at night. voltaren gel has not helped. No skin changes.  2/28: Patient reports about 30% improvement compared to last visit. Wearing brace regularly on right wrist and the prednisone helped some. Still with pain but can use more. Pain level up to 5/10 only now, can be sharp. No skin changes. Ring and index fingers are getting stuck at times also. No skin changes.  Past Medical History:  Diagnosis Date  . Fibromyalgia   . Hypothyroidism   . Osteoarthritis     Current Outpatient Medications on File Prior to Visit  Medication Sig Dispense Refill  . amitriptyline (ELAVIL) 25 MG tablet Take 1 tablet (25 mg total) by mouth at bedtime. 30 tablet 3  . Calcium Carbonate (CALCIUM 600 PO) Take 1 tablet by mouth 2 (two) times daily.    . Coenzyme Q10 60 MG CAPS Take 1 capsule by mouth 3 (three) times daily with meals.    . Diclofenac Sodium 2 % SOLN Apply 1 pump four times per day prn pain 1 Bottle 2  . DULoxetine (CYMBALTA) 60 MG capsule Take 1 capsule (60 mg total) by mouth daily. 30 capsule 3  . gabapentin (NEURONTIN) 300 MG capsule Take 1 capsule (300 mg total) by mouth 3 (three) times daily. 270 capsule 1  . levothyroxine (SYNTHROID, LEVOTHROID) 50 MCG tablet TAKE 1 TABLET DAILY BEFORE BREAKFAST 90 tablet 0  . MAGNESIUM MALATE PO Take 1,000 mg by mouth 3 (three) times daily with  meals.    . Manganese 50 MG TABS Take 1 tablet by mouth daily with supper.    . Nutritional Supplements (ESTROVEN PO) Take by mouth.    Marland Kitchen omeprazole (PRILOSEC) 40 MG capsule Take 40 mg by mouth daily.    . predniSONE (DELTASONE) 10 MG tablet 6 tabs po day 1, 5 tabs po day 2, 4 tabs po day 3, 3 tabs po day 4, 2 tabs po day 5, 1 tab po day 6 21 tablet 0  . simvastatin (ZOCOR) 40 MG tablet Take 1 tablet (40 mg total) by mouth daily. 90 tablet 3  . thiamine (VITAMIN B-1) 100 MG tablet Take 100 mg by mouth daily with supper.    . Vitamin D, Cholecalciferol, 400 units CAPS Take 1 capsule by mouth 2 (two) times daily with a meal.     No current facility-administered medications on file prior to visit.     Past Surgical History:  Procedure Laterality Date  . ABDOMINAL HYSTERECTOMY    . LIVER SURGERY    . TONSILLECTOMY      Allergies  Allergen Reactions  . Codeine   . Morphine And Related     Social History   Socioeconomic History  . Marital status: Unknown    Spouse name: Not on file  . Number of children: Not on file  . Years of education: Not on file  . Highest education level: Not on  file  Occupational History  . Not on file  Social Needs  . Financial resource strain: Not on file  . Food insecurity:    Worry: Not on file    Inability: Not on file  . Transportation needs:    Medical: Not on file    Non-medical: Not on file  Tobacco Use  . Smoking status: Never Smoker  . Smokeless tobacco: Never Used  Substance and Sexual Activity  . Alcohol use: Yes    Comment: rarely   . Drug use: Never  . Sexual activity: Not Currently    Birth control/protection: Surgical  Lifestyle  . Physical activity:    Days per week: Not on file    Minutes per session: Not on file  . Stress: Not on file  Relationships  . Social connections:    Talks on phone: Not on file    Gets together: Not on file    Attends religious service: Not on file    Active member of club or organization: Not  on file    Attends meetings of clubs or organizations: Not on file    Relationship status: Not on file  . Intimate partner violence:    Fear of current or ex partner: Not on file    Emotionally abused: Not on file    Physically abused: Not on file    Forced sexual activity: Not on file  Other Topics Concern  . Not on file  Social History Narrative  . Not on file    Family History  Problem Relation Age of Onset  . Hypertension Mother   . Diabetes Mother   . Arthritis Mother   . Heart disease Mother   . Rheum arthritis Father   . Diabetes Father   . Heart disease Father   . Cancer Brother        stomach     BP (!) 154/90   Pulse 75   Ht 5\' 2"  (1.575 m)   Wt 134 lb (60.8 kg)   BMI 24.51 kg/m   Review of Systems: See HPI above.     Objective:  Physical Exam:  Gen: NAD, comfortable in exam room  Right wrist/hand: No bruising, malrotation or angulation of digits.  Bouchards and Heberdens nodes.   FROM digits with 5/5 strength all motions now, improved. Flexion and extension of wrist 20 degrees. Tenderness over carpal tunnel, dorsal wrist, over A1 pulleys of 2nd and 4th digits. + tinels. Sensation intact to light touch.   Assessment & Plan:  1. Right wrist/hand pain - improved mildly since last visit.  She would like to continue with bracing.  Topical medications.  Shown band-aid splinting of digits for 2nd, 4th triggering.  Consider carpal tunnel injection as next step.  F/u in 6 weeks.

## 2019-01-03 ENCOUNTER — Telehealth: Payer: Self-pay | Admitting: Gastroenterology

## 2019-01-03 NOTE — Telephone Encounter (Signed)
DOD 10.1.19 Dr. Bryan Lemma, pt had previous colonoscopy at [facility] in 2017 - due for 3-year recall.  Colon & path reports will be placed on your desk for review.    Will you accept this pt?

## 2019-01-13 ENCOUNTER — Other Ambulatory Visit: Payer: Self-pay | Admitting: Family Medicine

## 2019-01-18 NOTE — Telephone Encounter (Signed)
Received records from [facility] and going to place on Dr. Vivia Ewing desk for review

## 2019-01-23 NOTE — Telephone Encounter (Signed)
Colonoscopy completed in 11/2015 for routine CRC screening.  Bowel prep was inadequate.  1 7 mm polyp was removed from 5 cm into the rectum.  Pathology was benign (blood clot).  Was recommended repeat in 3 years given an adequate bowel prep.  Based on current guidelines and indication for that prior procedure, I agree with that previous recommendation for short interval repeat given the inadequate bowel prep.  Okay to schedule directly for colonoscopy with me when endoscopy restrictions are lifted related to the COVID-19 pandemic.

## 2019-01-24 NOTE — Telephone Encounter (Signed)
Left message for patient to notify her of Dr.Cirigliano's recommendation will call her back when we are able to schedule colonoscopy after COVID-19 restrictions are lifted.

## 2019-01-25 NOTE — Telephone Encounter (Signed)
Pt called back and was advised and understands.

## 2019-02-04 ENCOUNTER — Other Ambulatory Visit (HOSPITAL_BASED_OUTPATIENT_CLINIC_OR_DEPARTMENT_OTHER): Payer: Medicare HMO

## 2019-02-11 ENCOUNTER — Ambulatory Visit: Payer: Medicare HMO | Admitting: Family Medicine

## 2019-02-16 ENCOUNTER — Encounter: Payer: Self-pay | Admitting: Family Medicine

## 2019-02-16 ENCOUNTER — Other Ambulatory Visit: Payer: Self-pay | Admitting: Family Medicine

## 2019-02-17 ENCOUNTER — Encounter: Payer: Self-pay | Admitting: Family Medicine

## 2019-02-18 MED ORDER — LEVOTHYROXINE SODIUM 50 MCG PO TABS: 50.0000 ug | ORAL_TABLET | Freq: Every day | ORAL | 1 refills | Status: DC

## 2019-02-18 MED ORDER — OMEPRAZOLE 40 MG PO CPDR: 40.0000 mg | DELAYED_RELEASE_CAPSULE | Freq: Every day | ORAL | 1 refills | Status: DC

## 2019-02-21 ENCOUNTER — Other Ambulatory Visit: Payer: Self-pay | Admitting: Family Medicine

## 2019-02-21 DIAGNOSIS — M797 Fibromyalgia: Secondary | ICD-10-CM

## 2019-03-26 DIAGNOSIS — Z0100 Encounter for examination of eyes and vision without abnormal findings: Secondary | ICD-10-CM | POA: Diagnosis not present

## 2019-03-26 DIAGNOSIS — H524 Presbyopia: Secondary | ICD-10-CM | POA: Diagnosis not present

## 2019-03-26 DIAGNOSIS — H04123 Dry eye syndrome of bilateral lacrimal glands: Secondary | ICD-10-CM | POA: Diagnosis not present

## 2019-03-27 ENCOUNTER — Other Ambulatory Visit: Payer: Self-pay

## 2019-03-27 ENCOUNTER — Ambulatory Visit (HOSPITAL_BASED_OUTPATIENT_CLINIC_OR_DEPARTMENT_OTHER)
Admission: RE | Admit: 2019-03-27 | Discharge: 2019-03-27 | Disposition: A | Discharge status: Home / Self Care | Payer: Medicare HMO | Source: Ambulatory Visit | Attending: Family Medicine | Admitting: Family Medicine

## 2019-03-27 DIAGNOSIS — Z13820 Encounter for screening for osteoporosis: Secondary | ICD-10-CM | POA: Diagnosis not present

## 2019-03-27 DIAGNOSIS — E2839 Other primary ovarian failure: Secondary | ICD-10-CM | POA: Diagnosis not present

## 2019-03-27 DIAGNOSIS — E348 Other specified endocrine disorders: Secondary | ICD-10-CM

## 2019-03-28 NOTE — Progress Notes (Signed)
Office Visit Note  Patient: Gina Hunt             Date of Birth: 22-Jun-1946           MRN: [id number]             PCP: [name], DO Referring: [name]* Visit Date: 04/11/2019 Occupation: @GUAROCC @  Subjective:  Generalized pain   History of Present Illness: Gina Hunt is a 73 y.o. female with history of fibromyalgia and osteoarthritis. She continues to have generalized muscle aches and muscle tenderness.  She continues to have pain in both hands, both knee joints, and both ankle joints.  She states her right 2nd, 3rd, and 4th fingers have been triggering.  She states the injections performed at her last visit did not help. She denies any joint swelling.   Activities of Daily Living:  Patient reports joint stiffness all day.  Patient Reports nocturnal pain.  Difficulty dressing/grooming: Denies Difficulty climbing stairs: Denies Difficulty getting out of chair: Denies Difficulty using hands for taps, buttons, cutlery, and/or writing: Reports  Review of Systems  Constitutional: Positive for fatigue.  HENT: Positive for mouth dryness. Negative for mouth sores and nose dryness.   Eyes: Positive for pain and dryness. Negative for visual disturbance.  Respiratory: Negative for cough, hemoptysis, shortness of breath, wheezing and difficulty breathing.   Cardiovascular: Negative for chest pain, palpitations, hypertension and swelling in legs/feet.  Gastrointestinal: Positive for constipation and diarrhea. Negative for blood in stool.  Endocrine: Negative for excessive thirst and increased urination.  Genitourinary: Negative for difficulty urinating and painful urination.  Musculoskeletal: Positive for arthralgias, joint pain, joint swelling, morning stiffness and muscle tenderness. Negative for myalgias, muscle weakness and myalgias.  Skin: Negative for color change, pallor, rash, hair loss, nodules/bumps, redness, skin tightness, ulcers and  sensitivity to sunlight.  Allergic/Immunologic: Negative for susceptible to infections.  Neurological: Negative for dizziness, numbness, headaches and weakness.  Hematological: Negative for bruising/bleeding tendency and swollen glands.  Psychiatric/Behavioral: Negative for depressed mood and sleep disturbance. The patient is not nervous/anxious.     PMFS History:  Patient Active Problem List   Diagnosis Date Noted   Hyperlipidemia 11/12/2018   Stress incontinence 10/12/2018   Other fatigue 05/19/2017   Dyslipidemia 03/08/2017   History of peptic ulcer 03/06/2017   History of hypothyroidism 03/06/2017   Fibromyalgia 09/06/2016   Primary osteoarthritis of both knees 09/06/2016   Primary osteoarthritis of both hands 09/06/2016   DJD (degenerative joint disease), cervical 09/06/2016   Peptic ulcer disease 09/06/2016   Hypothyroidism 09/06/2016    Past Medical History:  Diagnosis Date   Fibromyalgia    Hypothyroidism    Osteoarthritis     Family History  Problem Relation Age of Onset   Hypertension Mother    Diabetes Mother    Arthritis Mother    Heart disease Mother    Rheum arthritis Father    Diabetes Father    Heart disease Father    Cancer Brother        stomach    Past Surgical History:  Procedure Laterality Date   ABDOMINAL HYSTERECTOMY     LIVER SURGERY     TONSILLECTOMY     Social History   Social History Narrative   Not on file   Immunization History  Administered Date(s) Administered   Influenza, High Dose Seasonal PF 08/24/2018   Pneumococcal Polysaccharide-23 11/12/2018     Objective: Vital Signs: BP (!) 145/76 (BP Location: Left Arm,  Patient Position: Sitting, Cuff Size: Normal)    Pulse 70    Resp 12    Ht 5\' 2"  (1.575 m)    Wt 136 lb (61.7 kg)    BMI 24.87 kg/m    Physical Exam Vitals signs and nursing note reviewed.  Constitutional:      Appearance: She is well-developed.  HENT:     Head: Normocephalic and  atraumatic.  Eyes:     Conjunctiva/sclera: Conjunctivae normal.  Neck:     Musculoskeletal: Normal range of motion.  Cardiovascular:     Rate and Rhythm: Normal rate and regular rhythm.     Heart sounds: Normal heart sounds.  Pulmonary:     Effort: Pulmonary effort is normal.     Breath sounds: Normal breath sounds.  Abdominal:     General: Bowel sounds are normal.     Palpations: Abdomen is soft.  Lymphadenopathy:     Cervical: No cervical adenopathy.  Skin:    General: Skin is warm and dry.     Capillary Refill: Capillary refill takes less than 2 seconds.  Neurological:     Mental Status: She is alert and oriented to person, place, and time.  Psychiatric:        Behavior: Behavior normal.      Musculoskeletal Exam: C-spine limited ROM with lateral rotation to the left.  Thoracic and lumbar spine good ROM.  80% fist formation of the right hand.  Left hand has complete fist formation.  PIP and DIP synovial thickening.  Bilateral CMC synovial thickening. Tenosynovitis of right 2nd, 3rd, and 4th flexor tendons and left 2nd flexor tendon.  Hip joints, knee joints, ankle joints, MTPs, PIPs, and DIPs good ROM with no synovitis.  No warmth or effusion of knee joints.  No tenderness or swelling of ankle joints.   CDAI Exam: CDAI Score: -- Patient Global: --; Provider Global: -- Swollen: --; Tender: -- Joint Exam   No joint exam has been documented for this visit   There is currently no information documented on the homunculus. Go to the Rheumatology activity and complete the homunculus joint exam.  Investigation: No additional findings.  Imaging: Dg Bone Density  Result Date: 03/27/2019 EXAM: DUAL X-RAY ABSORPTIOMETRY (DXA) FOR BONE MINERAL DENSITY IMPRESSION: [facility] Your patient Zyairah Wacha completed a BMD test on 03/27/2019 using the [employer] (analysis version: 16.SP2) manufactured by [employer]. The following summarizes the results of our  evaluation. PATIENT: Name: Gina Hunt Patient ID: [id number] Birth Date: 01/09/1946 Height: 61.0 in. Gender: Female Measured: 03/27/2019 Weight: 135.0 lbs. Indications: Caucasian, Early Menopause, Estrogen Deficiency, Hypothyroidism, Hysterectomy, Oophorectomy ( Bilateral), Post Menopausal Fractures: Treatments: Calcium, Cymbalta, Gabapentin(seizure medication), Levothyroxine, Vitamin D ASSESSMENT: The BMD measured at Femur Neck Left is 0.923 g/cm2 with a T-score of -0.8. This patient is considered normal according to [place] [facility]) criteria. Scan quality was good. Site Region Measured Date Measured Age WHO YA BMD Classification T-score AP Spine L1-L4 3/27/2019 [age] Normal 2.8 1.514 g/cm2 DualFemur Neck Left 3/27/2019 73.1 [age] Normal -0.8 0.923 g/cm2 World Health Organization [facility]) criteria for post-menopausal, Caucasian Women: Normal       T-score at or above -1 SD Osteopenia   T-score between -1 and -2.5 SD Osteoporosis T-score at or below -2.5 SD RECOMMENDATION: 1. All patients should optimize calcium and vitamin D intake. 2. Consider FDA-approved medical therapies in postmenopausal women and men aged 53 years and older, based on the following: a. A hip or  vertebral(clinical or morphometric) fracture. b. T-Score < -2.5 at the femoral neck or spine after appropriate evaluation to exclude secondary causes c. Low bone mass (T-score between -1.0 and -2.5 at the femoral neck or spine) and a 10 year probability of a hip fracture >3% or a 10 year probability of major osteoporosis-related fracture > 20% based on the [place]-adapted WHO algorithm d. Clinical judgement and/or patient preferences may indicate treatment for people with 10-year fracture probabilities above or below these levels FOLLOW-UP: Patients with diagnosis of osteoporosis or at high risk for fracture should have regular bone mineral density tests. For patients eligible for Medicare, routine testing is allowed once every 2 years. The  testing frequency can be increased to one year for patients who have rapidly progressing disease, those who are receiving or discontinuing medical therapy to restore bone mass, or have additional risk factors. I have reviewed this report and agree with the above findings. [facility] Radiology Electronically Signed   By: Ilona Sorrel M.D.   On: 03/27/2019 15:47    Recent Labs: Lab Results  Component Value Date   NA 142 10/12/2018   K 4.8 10/12/2018   CL 105 10/12/2018   CO2 28 10/12/2018   GLUCOSE 91 10/12/2018   BUN 14 10/12/2018   CREATININE 0.65 10/12/2018   BILITOT 0.5 10/12/2018   AST 15 10/12/2018   ALT 13 10/12/2018   PROT 7.6 10/12/2018   CALCIUM 9.2 10/12/2018    Speciality Comments: No specialty comments available.  Procedures:  No procedures performed Allergies: Codeine and Morphine and related   Assessment / Plan:     Visit Diagnoses: Fibromyalgia -She has generalized muscle aches and muscle tenderness due to fibromyalgia.  She take Cymbalta 60 mg 1 capsule by mouth daily and gabapentin 300 mg 1 capsule by mouth 3 times daily. She continues to have chronic fatigue related to insomnia. She was encouraged to stay active and exercise on a regular basis. She will follow up in 6 months.   Primary osteoarthritis of both hands -PIP and DIP synovial thickening consistent with osteoarthritis of bilateral hands.  She has bilateral CMC joint synovial thickening.  No synovitis was noted on exam.  She has 80% fist formation of the right hand and complete fist formation of the left hand.  Plan: Joint protection and muscle strengthening were discussed.   Primary osteoarthritis of both knees -She has chronic pain in bilateral knee joints.  No warmth or effusion was noted on exam today.  She has good range of motion of both knee joints.  She declined x-rays of both knees today.  She was advised to notify us if she develops worsening joint pain or joint swelling.    DDD (degenerative disc  disease), cervical -She has good ROM with no discomfort at this time.  Trigger index finger of right hand - Plan: Referred to hand surgery.   Trigger finger, right middle finger - Plan: Referred to hand surgery.   Trigger ring finger of right hand - She had cortisone injection on 04/05/2018 and 12/04/2018.  She did not receive any relief after the most recent cortisone injection.  She is ready to proceed with surgical release.  She was referred to Dr. Amedeo Plenty. - Plan: Referred to hand surgery.  Trigger index finger of left hand - Mild tenderness.  She experiences intermittent locking.  Will refer to hand surgery.  Other medical conditions are listed as follows:   History of hypothyroidism   Dyslipidemia   History of peptic  ulcer   Orders: Orders Placed This Encounter  Procedures   Ambulatory referral to Hand Surgery   No orders of the defined types were placed in this encounter.     Follow-Up Instructions: Return in about 6 months (around 10/11/2019) for Fibromyalgia, Osteoarthritis.   Ofilia Neas, PA-C   I examined and evaluated the patient with Hazel Sams PA.  Patient had an adequate response to cortisone injection for her right fourth trigger finger.  She has multiple digits which are triggering at this point.  I will refer to hand surgery.  She also has knee joint discomfort.  We offered x-rays but she declined.  A handout on knee joint exercises was given.  The plan of care was discussed as noted above.  Bo Merino, MD  Note - This record has been created using Editor, commissioning.  Chart creation errors have been sought, but may not always  have been located. Such creation errors do not reflect on  the standard of medical care.

## 2019-03-29 ENCOUNTER — Other Ambulatory Visit: Payer: Self-pay | Admitting: Family Medicine

## 2019-03-29 DIAGNOSIS — M797 Fibromyalgia: Secondary | ICD-10-CM

## 2019-03-29 MED ORDER — DULOXETINE HCL 60 MG PO CPEP: 60.0000 mg | ORAL_CAPSULE | Freq: Every day | ORAL | 5 refills | Status: DC

## 2019-04-01 NOTE — Telephone Encounter (Signed)
Left message for patient to call back and schedule direct colonoscopy.  °

## 2019-04-02 ENCOUNTER — Encounter: Payer: Self-pay | Admitting: Gastroenterology

## 2019-04-11 ENCOUNTER — Other Ambulatory Visit: Payer: Self-pay

## 2019-04-11 ENCOUNTER — Ambulatory Visit: Payer: Medicare HMO | Admitting: Rheumatology

## 2019-04-11 ENCOUNTER — Encounter: Payer: Self-pay | Admitting: Rheumatology

## 2019-04-11 VITALS — BP 145/76 | HR 70 | Resp 12 | Ht 62.0 in | Wt 136.0 lb

## 2019-04-11 DIAGNOSIS — M65331 Trigger finger, right middle finger: Secondary | ICD-10-CM | POA: Diagnosis not present

## 2019-04-11 DIAGNOSIS — M5030 Other cervical disc degeneration, unspecified cervical region: Secondary | ICD-10-CM | POA: Diagnosis not present

## 2019-04-11 DIAGNOSIS — M65341 Trigger finger, right ring finger: Secondary | ICD-10-CM

## 2019-04-11 DIAGNOSIS — Z8639 Personal history of other endocrine, nutritional and metabolic disease: Secondary | ICD-10-CM | POA: Diagnosis not present

## 2019-04-11 DIAGNOSIS — M797 Fibromyalgia: Secondary | ICD-10-CM

## 2019-04-11 DIAGNOSIS — M170 Bilateral primary osteoarthritis of knee: Secondary | ICD-10-CM | POA: Diagnosis not present

## 2019-04-11 DIAGNOSIS — M65321 Trigger finger, right index finger: Secondary | ICD-10-CM

## 2019-04-11 DIAGNOSIS — E785 Hyperlipidemia, unspecified: Secondary | ICD-10-CM | POA: Diagnosis not present

## 2019-04-11 DIAGNOSIS — M19042 Primary osteoarthritis, left hand: Secondary | ICD-10-CM

## 2019-04-11 DIAGNOSIS — Z8711 Personal history of peptic ulcer disease: Secondary | ICD-10-CM

## 2019-04-11 DIAGNOSIS — M19041 Primary osteoarthritis, right hand: Secondary | ICD-10-CM | POA: Diagnosis not present

## 2019-04-11 DIAGNOSIS — M65322 Trigger finger, left index finger: Secondary | ICD-10-CM

## 2019-04-22 DIAGNOSIS — M65341 Trigger finger, right ring finger: Secondary | ICD-10-CM | POA: Diagnosis not present

## 2019-04-22 DIAGNOSIS — M79641 Pain in right hand: Secondary | ICD-10-CM | POA: Diagnosis not present

## 2019-04-24 ENCOUNTER — Encounter: Payer: Self-pay | Admitting: Gastroenterology

## 2019-04-24 ENCOUNTER — Ambulatory Visit: Payer: Medicare HMO | Admitting: *Deleted

## 2019-04-24 ENCOUNTER — Other Ambulatory Visit: Payer: Self-pay

## 2019-04-24 VITALS — Ht 61.0 in | Wt 133.0 lb

## 2019-04-24 DIAGNOSIS — Z86010 Personal history of colonic polyps: Secondary | ICD-10-CM

## 2019-04-24 MED ORDER — NA SULFATE-K SULFATE-MG SULF 17.5-3.13-1.6 GM/177ML PO SOLN: ORAL | 0 refills | Status: DC

## 2019-04-24 NOTE — Progress Notes (Signed)
Patient's pre-visit was done today over the phone with the patient due to COVID-19 pandemic. Name,DOB and address verified. Insurance verified. Packet of Prep instructions mailed to patient including copy of a consent form and pre-procedure patient acknowledgement form-pt is aware. Patient understands to call us back with any questions or concerns. Patient refused to take dulcolax as part of the 2 day prep, she states she can take magnesium citrate OTC. Patient denies any allergies to eggs or soy. Patient denies any problems with anesthesia/sedation. Patient denies any oxygen use at home. Patient denies taking any diet/weight loss medications or blood thinners. EMMI education declined. Pt is aware that care partner will wait in the car during parking lot; if they feel like they will be too hot to wait in the car; they may wait in the lobby.  We want them to wear a mask (we do not have any that we can provide them), practice social distancing, and we will check their temperatures when they get here.  I did remind patient that their care partner needs to stay in the parking lot the entire time. Pt will wear mask into building.

## 2019-05-07 ENCOUNTER — Telehealth: Payer: Self-pay | Admitting: Gastroenterology

## 2019-05-07 NOTE — Telephone Encounter (Signed)
Pt called and answered no to all questions.

## 2019-05-07 NOTE — Telephone Encounter (Signed)
Left message to call back to ask Covid-19 screening questions. Covid-19 Screening Questions:  Do you now or have you had a fever in the last 14 days? no  Do you have any respiratory symptoms of shortness of breath or cough now or in the last 14 days? no  Do you have any family members or close contacts with diagnosed or suspected Covid-19 in the past 14 days? no  Have you been tested for Covid-19 and found to be positive? no

## 2019-05-08 ENCOUNTER — Encounter: Payer: Self-pay | Admitting: Gastroenterology

## 2019-05-08 ENCOUNTER — Other Ambulatory Visit: Payer: Self-pay

## 2019-05-08 ENCOUNTER — Ambulatory Visit (AMBULATORY_SURGERY_CENTER): Payer: Medicare HMO | Admitting: Gastroenterology

## 2019-05-08 VITALS — BP 153/79 | HR 67 | Temp 98.5°F | Resp 14 | Ht 62.0 in | Wt 136.0 lb

## 2019-05-08 DIAGNOSIS — K5730 Diverticulosis of large intestine without perforation or abscess without bleeding: Secondary | ICD-10-CM

## 2019-05-08 DIAGNOSIS — Z86010 Personal history of colonic polyps: Secondary | ICD-10-CM

## 2019-05-08 DIAGNOSIS — D120 Benign neoplasm of cecum: Secondary | ICD-10-CM | POA: Diagnosis not present

## 2019-05-08 DIAGNOSIS — Z1211 Encounter for screening for malignant neoplasm of colon: Secondary | ICD-10-CM | POA: Diagnosis not present

## 2019-05-08 MED ORDER — SODIUM CHLORIDE 0.9 % IV SOLN: 500.0000 mL | Freq: Once | INTRAVENOUS | Status: DC

## 2019-05-08 NOTE — Progress Notes (Signed)
A and O x3. Report to RN. Tolerated MAC anesthesia well.

## 2019-05-08 NOTE — Patient Instructions (Signed)
   INFORMATION ON POLYPS AND DIVERTICULOSIS GIVEN TO YOU TODAY  AWAIT PATHOLOGY RESULTS ON POLYP REMOVED     YOU HAD AN ENDOSCOPIC PROCEDURE TODAY AT [facility]:   Refer to the procedure report that was given to you for any specific questions about what was found during the examination.  If the procedure report does not answer your questions, please call your gastroenterologist to clarify.  If you requested that your care partner not be given the details of your procedure findings, then the procedure report has been included in a sealed envelope for you to review at your convenience later.  YOU SHOULD EXPECT: Some feelings of bloating in the abdomen. Passage of more gas than usual.  Walking can help get rid of the air that was put into your GI tract during the procedure and reduce the bloating. If you had a lower endoscopy (such as a colonoscopy or flexible sigmoidoscopy) you may notice spotting of blood in your stool or on the toilet paper. If you underwent a bowel prep for your procedure, you may not have a normal bowel movement for a few days.  Please Note:  You might notice some irritation and congestion in your nose or some drainage.  This is from the oxygen used during your procedure.  There is no need for concern and it should clear up in a day or so.  SYMPTOMS TO REPORT IMMEDIATELY:   Following lower endoscopy (colonoscopy or flexible sigmoidoscopy):  Excessive amounts of blood in the stool  Significant tenderness or worsening of abdominal pains  Swelling of the abdomen that is new, acute  Fever of 100F or higher    For urgent or emergent issues, a gastroenterologist can be reached at any hour by calling [phone].   DIET:  We do recommend a small meal at first, but then you may proceed to your regular diet.  Drink plenty of fluids but you should avoid alcoholic beverages for 24 hours.  ACTIVITY:  You should plan to take it easy for the rest of today and  you should NOT DRIVE or use heavy machinery until tomorrow (because of the sedation medicines used during the test).    FOLLOW UP: Our staff will call the number listed on your records 48-72 hours following your procedure to check on you and address any questions or concerns that you may have regarding the information given to you following your procedure. If we do not reach you, we will leave a message.  We will attempt to reach you two times.  During this call, we will ask if you have developed any symptoms of COVID 19. If you develop any symptoms (ie: fever, flu-like symptoms, shortness of breath, cough etc.) before then, please call [phone].  If you test positive for Covid 19 in the 2 weeks post procedure, please call and report this information to [place].    If any biopsies were taken you will be contacted by phone or by letter within the next 1-3 weeks.  Please call us at [phone] if you have not heard about the biopsies in 3 weeks.    SIGNATURES/CONFIDENTIALITY: You and/or your care partner have signed paperwork which will be entered into your electronic medical record.  These signatures attest to the fact that that the information above on your After Visit Summary has been reviewed and is understood.  Full responsibility of the confidentiality of this discharge information lies with you and/or your care-partner.

## 2019-05-08 NOTE — Op Note (Signed)
[facility] Patient Name: Gina Hunt Procedure Date: 05/08/2019 9:15 AM MRN: [id number] Endoscopist: Gerrit Heck , MD Age: 73 Referring MD:  Date of Birth: November 15, 1945 Gender: Female Account #: [id number] Procedure:                Colonoscopy Indications:              High risk colon cancer surveillance: Personal                            history of colonic polyps                           Colonoscopy at outside faciltiy in 11/2015 notable                            for a benign rectal polyp, resected with snare,                            with recommendation to repeat in 3 years due to                            suboptimal bowel preparation. Otherwise, she is                            without GI symptoms. Medicines:                Monitored Anesthesia Care Procedure:                Pre-Anesthesia Assessment:                           - Prior to the procedure, a History and Physical                            was performed, and patient medications and                            allergies were reviewed. The patient's tolerance of                            previous anesthesia was also reviewed. The risks                            and benefits of the procedure and the sedation                            options and risks were discussed with the patient.                            All questions were answered, and informed consent                            was obtained. Prior Anticoagulants: The patient has  taken no previous anticoagulant or antiplatelet                            agents. ASA Grade Assessment: II - A patient with                            mild systemic disease. After reviewing the risks                            and benefits, the patient was deemed in                            satisfactory condition to undergo the procedure.                           After obtaining informed consent, the colonoscope                             was passed under direct vision. Throughout the                            procedure, the patient's blood pressure, pulse, and                            oxygen saturations were monitored continuously. The                            Colonoscope was introduced through the anus and                            advanced to the the cecum, identified by                            appendiceal orifice and ileocecal valve. The                            colonoscopy was performed without difficulty. The                            patient tolerated the procedure well. The quality                            of the bowel preparation was adequate. The                            ileocecal valve, appendiceal orifice, and rectum                            were photographed. Scope In: 9:33:25 AM Scope Out: 9:58:01 AM Scope Withdrawal Time: 0 hours 17 minutes 50 seconds  Total Procedure Duration: 0 hours 24 minutes 36 seconds  Findings:                 The perianal and digital rectal examinations were  normal.                           A 6 mm polyp was found in the cecum. The polyp was                            sessile. The polyp was removed with a cold snare.                            Resection and retrieval were complete. Estimated                            blood loss was minimal.                           A few small and large-mouthed diverticula were                            found in the sigmoid colon and transverse colon.                           The hepatic flexure was moderately tortuous.                            Advancing the scope required using manual pressure.                           The retroflexed view of the distal rectum and anal                            verge was normal and showed no anal or rectal                            abnormalities. Complications:            No immediate complications. Estimated Blood Loss:     Estimated blood loss was  minimal. Impression:               - One 6 mm polyp in the cecum, removed with a cold                            snare. Resected and retrieved.                           - Diverticulosis in the sigmoid colon and in the                            transverse colon.                           - Tortuous colon.                           - The distal rectum and anal verge are normal on  retroflexion view. Recommendation:           - Patient has a contact number available for                            emergencies. The signs and symptoms of potential                            delayed complications were discussed with the                            patient. Return to normal activities tomorrow.                            Written discharge instructions were provided to the                            patient.                           - Resume previous diet.                           - Continue present medications.                           - Await pathology results.                           - Repeat colonoscopy for surveillance based on                            polyp histology.                           - Return to GI office PRN. Gerrit Heck, MD 05/08/2019 10:05:34 AM

## 2019-05-08 NOTE — Progress Notes (Signed)
Called to room to assist during endoscopic procedure.  Patient ID and intended procedure confirmed with present staff. Received instructions for my participation in the procedure from the performing physician.  

## 2019-05-10 ENCOUNTER — Telehealth: Payer: Self-pay

## 2019-05-10 ENCOUNTER — Encounter: Payer: Self-pay | Admitting: Gastroenterology

## 2019-05-10 NOTE — Telephone Encounter (Signed)
Covid-19 screening questions   Do you now or have you had a fever in the last 14 days? No.  Do you have any respiratory symptoms of shortness of breath or cough now or in the last 14 days? No.  Do you have any family members or close contacts with diagnosed or suspected Covid-19 in the past 14 days? No.  Have you been tested for Covid-19 and found to be positive? No.       Follow up Call-  Call back number 05/08/2019  Post procedure Call Back phone  # [phone]  Permission to leave phone message Yes  Some recent data might be hidden     Patient questions:  Do you have a fever, pain , or abdominal swelling? No. Pain Score  0 *  Have you tolerated food without any problems? Yes.    Have you been able to return to your normal activities? Yes.    Do you have any questions about your discharge instructions: Diet   No. Medications  No. Follow up visit  No.  Do you have questions or concerns about your Care? No.  Actions: * If pain score is 4 or above: No action needed, pain <4.

## 2019-05-16 ENCOUNTER — Ambulatory Visit: Payer: Medicare HMO | Admitting: Family Medicine

## 2019-05-20 DIAGNOSIS — M65341 Trigger finger, right ring finger: Secondary | ICD-10-CM | POA: Diagnosis not present

## 2019-05-24 ENCOUNTER — Other Ambulatory Visit: Payer: Self-pay

## 2019-05-24 ENCOUNTER — Ambulatory Visit (INDEPENDENT_AMBULATORY_CARE_PROVIDER_SITE_OTHER): Payer: Medicare HMO | Admitting: Family Medicine

## 2019-05-24 ENCOUNTER — Encounter: Payer: Self-pay | Admitting: Family Medicine

## 2019-05-24 VITALS — BP 140/80 | HR 79 | Temp 98.1°F | Ht 61.0 in | Wt 133.0 lb

## 2019-05-24 DIAGNOSIS — E039 Hypothyroidism, unspecified: Secondary | ICD-10-CM

## 2019-05-24 DIAGNOSIS — M797 Fibromyalgia: Secondary | ICD-10-CM | POA: Diagnosis not present

## 2019-05-24 DIAGNOSIS — E785 Hyperlipidemia, unspecified: Secondary | ICD-10-CM

## 2019-05-24 MED ORDER — GABAPENTIN 300 MG PO CAPS: 300.0000 mg | ORAL_CAPSULE | Freq: Three times a day (TID) | ORAL | 2 refills | Status: DC

## 2019-05-24 NOTE — Progress Notes (Signed)
Chief Complaint  Patient presents with  . Follow-up    Subjective: Hyperlipidemia Patient presents for Hyperlipidemia follow up. Currently taking simvastatin 40 mg/d and compliance with treatment thus far has been good. She denies myalgias. She is adhering to a healthy diet. Exercise: stays active at home The patient is not known to have coexisting coronary artery disease.  Hypothyroidism Patient presents for follow-up of hypothyroidism.  Reports compliance with medication- levothyroxine 50 mcg/d. Denies: denies fatigue, weight changes, heat/cold intolerance, bowel/skin changes or CVS symptoms  FM Still having pain, controlled on Cymbalta 60 mg/d. No AE's, reports compliance. Much better since starting this. Tries to stay active.   ROS: Heart: Denies chest pain or palpitations Lungs: Denies SOB or cough  Past Medical History:  Diagnosis Date  . Fibromyalgia   . GERD (gastroesophageal reflux disease)   . Hyperlipidemia   . Hypothyroidism   . Osteoarthritis   . Osteoarthritis     Objective: BP 140/80 (BP Location: Left Arm, Patient Position: Sitting, Cuff Size: Normal)   Pulse 79   Temp 98.1 F (36.7 C) (Oral)   Ht 5\' 1"  (1.549 m)   Wt 133 lb (60.3 kg)   SpO2 97%   BMI 25.13 kg/m  General: Awake, appears stated age HEENT: MMM Heart: RRR, no LE edema, no bruits Lungs: CTAB, no rales, wheezes or rhonchi. No accessory muscle use Psych: Age appropriate judgment and insight, normal affect and mood  Assessment and Plan: Hyperlipidemia, unspecified hyperlipidemia type - Plan: Cont statin  Fibromyalgia - Plan: gabapentin (NEURONTIN) 300 MG capsule, Cont Cymbalta.   Hypothyroidism, unspecified type - Plan: Cont Synthroid. Will reck at next visit.  Orders as above. F/u in 6 mo for CPE. The patient voiced understanding and agreement to the plan.  Reynolds, DO 05/24/19  2:13 PM

## 2019-05-24 NOTE — Patient Instructions (Signed)
We will get labs at your next appointment.  Keep the diet clean and stay active.  Let us know if you need anything.

## 2019-06-03 DIAGNOSIS — M79641 Pain in right hand: Secondary | ICD-10-CM | POA: Diagnosis not present

## 2019-07-23 ENCOUNTER — Other Ambulatory Visit: Payer: Self-pay | Admitting: Family Medicine

## 2019-07-31 DIAGNOSIS — M79641 Pain in right hand: Secondary | ICD-10-CM | POA: Diagnosis not present

## 2019-08-14 DIAGNOSIS — M79641 Pain in right hand: Secondary | ICD-10-CM | POA: Diagnosis not present

## 2019-08-21 ENCOUNTER — Ambulatory Visit (INDEPENDENT_AMBULATORY_CARE_PROVIDER_SITE_OTHER): Payer: Medicare HMO

## 2019-08-21 ENCOUNTER — Other Ambulatory Visit: Payer: Self-pay

## 2019-08-21 DIAGNOSIS — Z23 Encounter for immunization: Secondary | ICD-10-CM

## 2019-09-06 DIAGNOSIS — E039 Hypothyroidism, unspecified: Secondary | ICD-10-CM | POA: Diagnosis not present

## 2019-09-06 DIAGNOSIS — I739 Peripheral vascular disease, unspecified: Secondary | ICD-10-CM | POA: Diagnosis not present

## 2019-09-06 DIAGNOSIS — G629 Polyneuropathy, unspecified: Secondary | ICD-10-CM | POA: Diagnosis not present

## 2019-09-06 DIAGNOSIS — Z809 Family history of malignant neoplasm, unspecified: Secondary | ICD-10-CM | POA: Diagnosis not present

## 2019-09-06 DIAGNOSIS — K219 Gastro-esophageal reflux disease without esophagitis: Secondary | ICD-10-CM | POA: Diagnosis not present

## 2019-09-06 DIAGNOSIS — R32 Unspecified urinary incontinence: Secondary | ICD-10-CM | POA: Diagnosis not present

## 2019-09-06 DIAGNOSIS — G8929 Other chronic pain: Secondary | ICD-10-CM | POA: Diagnosis not present

## 2019-09-06 DIAGNOSIS — E785 Hyperlipidemia, unspecified: Secondary | ICD-10-CM | POA: Diagnosis not present

## 2019-09-06 DIAGNOSIS — M1990 Unspecified osteoarthritis, unspecified site: Secondary | ICD-10-CM | POA: Diagnosis not present

## 2019-09-06 DIAGNOSIS — R69 Illness, unspecified: Secondary | ICD-10-CM | POA: Diagnosis not present

## 2019-09-19 ENCOUNTER — Telehealth: Payer: Self-pay | Admitting: *Deleted

## 2019-09-19 NOTE — Telephone Encounter (Signed)
Patient contacted the office requesting a call back. Attempted to contact the patient and left message for patient to call the office.

## 2019-09-26 ENCOUNTER — Encounter: Payer: Self-pay | Admitting: Family Medicine

## 2019-09-26 DIAGNOSIS — M797 Fibromyalgia: Secondary | ICD-10-CM

## 2019-09-30 DIAGNOSIS — H9313 Tinnitus, bilateral: Secondary | ICD-10-CM | POA: Diagnosis not present

## 2019-09-30 MED ORDER — DULOXETINE HCL 60 MG PO CPEP: 60.0000 mg | ORAL_CAPSULE | Freq: Every day | ORAL | 5 refills | Status: DC

## 2019-10-03 ENCOUNTER — Encounter: Payer: Self-pay | Admitting: Hematology & Oncology

## 2019-10-03 DIAGNOSIS — Z1231 Encounter for screening mammogram for malignant neoplasm of breast: Secondary | ICD-10-CM | POA: Diagnosis not present

## 2019-10-08 NOTE — Progress Notes (Signed)
Virtual Visit via Telephone Note  I connected with Gina Hunt on 10/10/19 at 11:00 AM EST by telephone and verified that I am speaking with the correct person using two identifiers.  Location: Patient: Home  Provider: Clinic This service was conducted via virtual visit.  Both audio and visual tools were used.  The patient was located at home. I was located in my office.  Consent was obtained prior to the virtual visit and is aware of possible charges through their insurance for this visit.  The patient is an established patient.  Dr. Estanislado Pandy, MD conducted the virtual visit and Hazel Sams, PA-C acted as scribe during the service.  Office staff helped with scheduling follow up visits after the service was conducted.   I discussed the limitations, risks, security and privacy concerns of performing an evaluation and management service by telephone and the availability of in person appointments. I also discussed with the patient that there may be a patient responsible charge related to this service. The patient expressed understanding and agreed to proceed.  CC: Generalized pain  History of Present Illness: Patient is a 73 year old female with past medical history of fibromyalgia, DDD, and osteoarthritis.  She has generalized muscle aches and muscle tenderness due to fibromyalgia.  She has chronic pain in bilateral CMC joints.  She performs hand exercises.  She has chronic pain in both knee joints.  She has not been exercising recently.  She had a right ring trigger finger release by Dr. Amedeo Plenty in July 2020. She went to physical therapy and has not had any complications. She continues to have chronic neck pain and stiffness.   Review of Systems  Constitutional: Positive for malaise/fatigue. Negative for fever.  Eyes: Negative for photophobia, pain, discharge and redness.  Respiratory: Negative for cough, shortness of breath and wheezing.   Cardiovascular: Negative for chest pain and  palpitations.  Gastrointestinal: Negative for blood in stool, constipation and diarrhea.  Genitourinary: Negative for dysuria.  Musculoskeletal: Positive for joint pain, myalgias and neck pain. Negative for back pain.  Skin: Negative for rash.  Neurological: Negative for dizziness and headaches.  Psychiatric/Behavioral: Negative for depression. The patient is nervous/anxious. The patient does not have insomnia.       Observations/Objective: Physical Exam  Constitutional: She is oriented to person, place, and time.  Neurological: She is alert and oriented to person, place, and time.  Psychiatric: Mood, memory, affect and judgment normal.   Patient reports morning stiffness for several  hours.   Patient reports nocturnal pain.  Difficulty dressing/grooming: Denies Difficulty climbing stairs: Reports Difficulty getting out of chair: Denies Difficulty using hands for taps, buttons, cutlery, and/or writing: Reports    Assessment and Plan: Visit Diagnoses: Fibromyalgia -She has ongoing generalized muscle aches and muscle tenderness. She has been having increased muscle tension.  She has morning stiffness for several hours daily.  She is taking cymbalta 60 mg 1 capsule by mouth daily and Gabapentin 300 mg 1 capsule TID. She has chronic fatigue but no difficulty sleeping at night.  We discussed the importance of regular exercise and good sleep hygiene.  She will follow up in 5-6 months.   Primary osteoarthritis of both hands -She has chronic bilateral CMC joint pain.  She has no joint swelling at this time.  He has been performing hand exercises daily.  Joint protection and muscle strengthening were discussed.   Primary osteoarthritis of both knees -She has chronic pain in both knee joints.  No joint  swelling.  She has difficulty climbing steps and getting up from a chair.  We discussed the importance of lower extremity muscle strengthening.   DDD (degenerative disc disease), cervical  -She  has chronic neck pain and stiffness.     Trigger ring finger of right hand - She had a right ring trigger finger release in July by Dr. Amedeo Plenty. She underwent physical therapy and has not had any complications.  Other medical conditions are listed as follows:   History of hypothyroidism   Dyslipidemia   History of peptic ulcer   Follow Up Instructions: She will follow up 5-6 months.    I discussed the assessment and treatment plan with the patient. The patient was provided an opportunity to ask questions and all were answered. The patient agreed with the plan and demonstrated an understanding of the instructions.   The patient was advised to call back or seek an in-person evaluation if the symptoms worsen or if the condition fails to improve as anticipated.  I provided 15 minutes of non-face-to-face time during this encounter.   Bo Merino, MD   Scribed by-  Hazel Sams, PA-C

## 2019-10-10 ENCOUNTER — Telehealth (INDEPENDENT_AMBULATORY_CARE_PROVIDER_SITE_OTHER): Payer: Medicare HMO | Admitting: Rheumatology

## 2019-10-10 ENCOUNTER — Other Ambulatory Visit: Payer: Self-pay

## 2019-10-10 ENCOUNTER — Encounter: Payer: Self-pay | Admitting: Rheumatology

## 2019-10-10 DIAGNOSIS — Z8711 Personal history of peptic ulcer disease: Secondary | ICD-10-CM

## 2019-10-10 DIAGNOSIS — M797 Fibromyalgia: Secondary | ICD-10-CM | POA: Diagnosis not present

## 2019-10-10 DIAGNOSIS — M65341 Trigger finger, right ring finger: Secondary | ICD-10-CM | POA: Diagnosis not present

## 2019-10-10 DIAGNOSIS — M19042 Primary osteoarthritis, left hand: Secondary | ICD-10-CM | POA: Diagnosis not present

## 2019-10-10 DIAGNOSIS — M65331 Trigger finger, right middle finger: Secondary | ICD-10-CM | POA: Diagnosis not present

## 2019-10-10 DIAGNOSIS — M65322 Trigger finger, left index finger: Secondary | ICD-10-CM | POA: Diagnosis not present

## 2019-10-10 DIAGNOSIS — M170 Bilateral primary osteoarthritis of knee: Secondary | ICD-10-CM | POA: Diagnosis not present

## 2019-10-10 DIAGNOSIS — M65321 Trigger finger, right index finger: Secondary | ICD-10-CM | POA: Diagnosis not present

## 2019-10-10 DIAGNOSIS — M19041 Primary osteoarthritis, right hand: Secondary | ICD-10-CM | POA: Diagnosis not present

## 2019-10-10 DIAGNOSIS — Z8639 Personal history of other endocrine, nutritional and metabolic disease: Secondary | ICD-10-CM

## 2019-10-10 DIAGNOSIS — M5030 Other cervical disc degeneration, unspecified cervical region: Secondary | ICD-10-CM | POA: Diagnosis not present

## 2019-10-10 DIAGNOSIS — E785 Hyperlipidemia, unspecified: Secondary | ICD-10-CM

## 2019-10-18 ENCOUNTER — Other Ambulatory Visit: Payer: Self-pay | Admitting: Family Medicine

## 2019-10-22 ENCOUNTER — Telehealth: Payer: Self-pay | Admitting: Rheumatology

## 2019-10-22 NOTE — Telephone Encounter (Signed)
LMOM for patient to call and schedule 5-6 month follow-up appointment.

## 2019-10-22 NOTE — Telephone Encounter (Signed)
-----   Message from Shona Needles, [place] sent at 10/10/2019  4:07 PM EST ----- Regarding: 5-6 MONTH F/U

## 2019-11-27 ENCOUNTER — Other Ambulatory Visit: Payer: Self-pay | Admitting: Family Medicine

## 2019-11-29 ENCOUNTER — Encounter: Payer: Medicare HMO | Admitting: Family Medicine

## 2019-12-19 ENCOUNTER — Other Ambulatory Visit: Payer: Self-pay

## 2019-12-20 ENCOUNTER — Ambulatory Visit (INDEPENDENT_AMBULATORY_CARE_PROVIDER_SITE_OTHER): Payer: Medicare HMO | Admitting: Family Medicine

## 2019-12-20 ENCOUNTER — Encounter: Payer: Self-pay | Admitting: Family Medicine

## 2019-12-20 VITALS — BP 140/86 | HR 72 | Temp 97.7°F | Ht 61.0 in | Wt 131.4 lb

## 2019-12-20 DIAGNOSIS — Z0000 Encounter for general adult medical examination without abnormal findings: Secondary | ICD-10-CM

## 2019-12-20 DIAGNOSIS — M797 Fibromyalgia: Secondary | ICD-10-CM

## 2019-12-20 DIAGNOSIS — Z66 Do not resuscitate: Secondary | ICD-10-CM | POA: Diagnosis not present

## 2019-12-20 MED ORDER — DULOXETINE HCL 60 MG PO CPEP: 60.0000 mg | ORAL_CAPSULE | Freq: Two times a day (BID) | ORAL | 5 refills | Status: DC

## 2019-12-20 NOTE — Patient Instructions (Signed)
Keep the diet clean and stay active.  Give [place] 2-3 business days to get the results of your labs back.   The new Shingrix vaccine (for shingles) is a 2 shot series. It can make people feel low energy, achy and almost like they have the flu for 48 hours after injection. Please plan accordingly when deciding on when to get this shot. Call our office for a nurse visit appointment to get this. The second shot of the series is less severe regarding the side effects, but it still lasts 48 hours.   Let us know if you need anything.

## 2019-12-20 NOTE — Progress Notes (Addendum)
Chief Complaint  Patient presents with  . Annual Exam     Well Woman Gina Hunt is here for a complete physical.   Her last physical was >1 year ago.  Current diet: in general, a "healthy" diet. Current exercise: Active at home. Weight is stable and she denies daytime fatigue. Seatbelt? Yes  Health Maintenance Colonoscopy- Yes Shingrix- No DEXA- Yes Mammogram- Yes Tetanus- Yes Pneumonia- Yes Hep C screen- Yes  Past Medical History:  Diagnosis Date  . Fibromyalgia   . GERD (gastroesophageal reflux disease)   . Hyperlipidemia   . Hypothyroidism   . Osteoarthritis   . Osteoarthritis      Past Surgical History:  Procedure Laterality Date  . ABDOMINAL HYSTERECTOMY    . COLONOSCOPY  11/24/2015  . LIVER SURGERY    . TONSILLECTOMY      Medications  Current Outpatient Medications on File Prior to Visit  Medication Sig Dispense Refill  . Calcium Carbonate (CALCIUM 600 PO) Take 1 tablet by mouth 2 (two) times daily.    . Coenzyme Q10 60 MG CAPS Take 1 capsule by mouth 3 (three) times daily with meals.    . DULoxetine (CYMBALTA) 60 MG capsule Take 1 capsule (60 mg total) by mouth daily. 30 capsule 5  . gabapentin (NEURONTIN) 300 MG capsule Take 1 capsule (300 mg total) by mouth 3 (three) times daily. 270 capsule 2  . levothyroxine (SYNTHROID) 50 MCG tablet TAKE 1 TABLET DAILY BEFORE BREAKFAST 90 tablet 1  . Nutritional Supplements (ESTROVEN PO) Take by mouth.    Marland Kitchen omeprazole (PRILOSEC) 40 MG capsule TAKE 1 CAPSULE DAILY 90 capsule 1  . simvastatin (ZOCOR) 40 MG tablet Take 1 tablet by mouth once daily 90 tablet 0  . thiamine (VITAMIN B-1) 100 MG tablet Take 100 mg by mouth daily with supper.    . Vitamin D, Cholecalciferol, 400 units CAPS Take 1 capsule by mouth 2 (two) times daily with a meal.      Allergies Allergies  Allergen Reactions  . Codeine Other (See Comments)    Hallucination   . Morphine And Related Other (See Comments)    Knocks me out per pt     Review of Systems: Constitutional:  no sweats Eye:  no recent significant change in vision Ear/Nose/Mouth/Throat:  Ears:  No changes in hearing Nose/Mouth/Throat:  no complaints of nasal congestion, no sore throat Cardiovascular: no chest pain Respiratory:  No cough and no shortness of breath Gastrointestinal:  no abdominal pain, no change in bowel habits GU:  Female: negative for dysuria or pelvic pain Musculoskeletal/Extremities:  No new pain of the joints Integumentary (Skin/Breast):  no abnormal skin lesions reported Neurologic:  no headaches Psychiatric:  +anxiety, no depression Endocrine:  denies unexplained weight changes Hematologic/Lymphatic:  no abnormal bleeding  Exam BP 140/86 (BP Location: Left Arm, Patient Position: Sitting, Cuff Size: Normal)   Pulse 72   Temp 97.7 F (36.5 C) (Temporal)   Ht 5\' 1"  (1.549 m)   Wt 131 lb 6 oz (59.6 kg)   SpO2 97%   BMI 24.82 kg/m  General:  well developed, well nourished, in no apparent distress Skin:  no significant moles, warts, or growths Head:  no masses, lesions, or tenderness Eyes:  pupils equal and round, sclera anicteric without injection Ears:  canals without lesions, TMs shiny without retraction, no obvious effusion, no erythema Nose:  nares patent, septum midline, mucosa normal, and no drainage or sinus tenderness Throat/Pharynx:  lips and gingiva without lesion;  tongue and uvula midline; non-inflamed pharynx; no exudates or postnasal drainage Neck: neck supple without adenopathy, thyromegaly, or masses Lungs:  clear to auscultation, breath sounds equal bilaterally, no respiratory distress Cardio:  regular rate and rhythm, no bruits or LE edema Abdomen:  abdomen soft, nontender; bowel sounds normal; no masses or organomegaly Genital: Deferred Musculoskeletal:  symmetrical muscle groups noted without atrophy or deformity Extremities:  no clubbing, cyanosis, or edema, no deformities, no skin discoloration Neuro:   gait normal; deep tendon reflexes normal and symmetric Psych: well oriented with normal range of affect and appropriate judgment/insight  Assessment and Plan  Well adult exam - Plan: Comprehensive metabolic panel, Lipid panel, CANCELED: Comprehensive metabolic panel, CANCELED: Lipid panel  Fibromyalgia - Plan: DULoxetine (CYMBALTA) 60 MG capsule   Well 74 y.o. female. Counseled on diet and exercise. Of note, Gina Hunt tells me she is a DNR. She will get [place] her advanced directive paperwork as soon as convenient.  Other orders as above. Follow up in 6-8 weeks to see how she is doing on BID Cymbalta. Could consider adjustment of gabapentin vs reducing dosage and adding Elavil.  The patient voiced understanding and agreement to the plan.  [place], DO 12/20/19 7:51 PM

## 2019-12-21 LAB — COMPREHENSIVE METABOLIC PANEL
AG Ratio: 1.6 (calc) (ref 1.0–2.5)
ALT: 20 U/L (ref 6–29)
AST: 23 U/L (ref 10–35)
Albumin: 4.5 g/dL (ref 3.6–5.1)
Alkaline phosphatase (APISO): 83 U/L (ref 37–153)
BUN/Creatinine Ratio: 17 (calc) (ref 6–22)
BUN: 10 mg/dL (ref 7–25)
CO2: 27 mmol/L (ref 20–32)
Calcium: 9.5 mg/dL (ref 8.6–10.4)
Chloride: 106 mmol/L (ref 98–110)
Creat: 0.58 mg/dL — ABNORMAL LOW (ref 0.60–0.93)
Globulin: 2.9 g/dL (calc) (ref 1.9–3.7)
Glucose, Bld: 88 mg/dL (ref 65–99)
Potassium: 4.2 mmol/L (ref 3.5–5.3)
Sodium: 144 mmol/L (ref 135–146)
Total Bilirubin: 0.5 mg/dL (ref 0.2–1.2)
Total Protein: 7.4 g/dL (ref 6.1–8.1)

## 2019-12-21 LAB — LIPID PANEL
Cholesterol: 158 mg/dL (ref <200)
HDL: 46 mg/dL — ABNORMAL LOW (ref >=50)
LDL Cholesterol (Calc): 87 mg/dL (calc)
Non-HDL Cholesterol (Calc): 112 mg/dL (calc) (ref <130)
Total CHOL/HDL Ratio: 3.4 (calc) (ref <5.0)
Triglycerides: 146 mg/dL (ref <150)

## 2020-02-29 DIAGNOSIS — I951 Orthostatic hypotension: Secondary | ICD-10-CM | POA: Diagnosis not present

## 2020-02-29 DIAGNOSIS — R030 Elevated blood-pressure reading, without diagnosis of hypertension: Secondary | ICD-10-CM | POA: Diagnosis not present

## 2020-02-29 DIAGNOSIS — M1990 Unspecified osteoarthritis, unspecified site: Secondary | ICD-10-CM | POA: Diagnosis not present

## 2020-02-29 DIAGNOSIS — R69 Illness, unspecified: Secondary | ICD-10-CM | POA: Diagnosis not present

## 2020-02-29 DIAGNOSIS — G8929 Other chronic pain: Secondary | ICD-10-CM | POA: Diagnosis not present

## 2020-02-29 DIAGNOSIS — E039 Hypothyroidism, unspecified: Secondary | ICD-10-CM | POA: Diagnosis not present

## 2020-02-29 DIAGNOSIS — E785 Hyperlipidemia, unspecified: Secondary | ICD-10-CM | POA: Diagnosis not present

## 2020-02-29 DIAGNOSIS — M797 Fibromyalgia: Secondary | ICD-10-CM | POA: Diagnosis not present

## 2020-02-29 DIAGNOSIS — K219 Gastro-esophageal reflux disease without esophagitis: Secondary | ICD-10-CM | POA: Diagnosis not present

## 2020-02-29 DIAGNOSIS — K5900 Constipation, unspecified: Secondary | ICD-10-CM | POA: Diagnosis not present

## 2020-03-09 DIAGNOSIS — R69 Illness, unspecified: Secondary | ICD-10-CM | POA: Diagnosis not present

## 2020-05-29 ENCOUNTER — Other Ambulatory Visit: Payer: Self-pay | Admitting: Family Medicine

## 2020-06-23 DIAGNOSIS — R69 Illness, unspecified: Secondary | ICD-10-CM | POA: Diagnosis not present

## 2020-08-17 ENCOUNTER — Other Ambulatory Visit: Payer: Self-pay | Admitting: Family Medicine

## 2020-08-17 DIAGNOSIS — M797 Fibromyalgia: Secondary | ICD-10-CM

## 2020-08-18 ENCOUNTER — Other Ambulatory Visit: Payer: Self-pay | Admitting: Family Medicine

## 2020-09-17 DIAGNOSIS — M65331 Trigger finger, right middle finger: Secondary | ICD-10-CM | POA: Diagnosis not present

## 2020-09-17 DIAGNOSIS — M65321 Trigger finger, right index finger: Secondary | ICD-10-CM | POA: Diagnosis not present

## 2020-09-17 DIAGNOSIS — M13841 Other specified arthritis, right hand: Secondary | ICD-10-CM | POA: Diagnosis not present

## 2020-09-17 DIAGNOSIS — M79641 Pain in right hand: Secondary | ICD-10-CM | POA: Diagnosis not present

## 2020-11-21 DIAGNOSIS — M797 Fibromyalgia: Secondary | ICD-10-CM

## 2020-11-23 MED ORDER — LEVOTHYROXINE SODIUM 50 MCG PO TABS: 50.0000 ug | ORAL_TABLET | Freq: Every day | ORAL | 2 refills | Status: DC

## 2020-11-23 MED ORDER — GABAPENTIN 300 MG PO CAPS: 300.0000 mg | ORAL_CAPSULE | Freq: Three times a day (TID) | ORAL | 1 refills | Status: DC

## 2020-11-24 DIAGNOSIS — Z1231 Encounter for screening mammogram for malignant neoplasm of breast: Secondary | ICD-10-CM | POA: Diagnosis not present

## 2020-12-02 DIAGNOSIS — R928 Other abnormal and inconclusive findings on diagnostic imaging of breast: Secondary | ICD-10-CM | POA: Diagnosis not present

## 2020-12-08 ENCOUNTER — Encounter: Payer: Self-pay | Admitting: Family Medicine

## 2020-12-08 ENCOUNTER — Other Ambulatory Visit: Payer: Self-pay | Admitting: Radiology

## 2020-12-08 DIAGNOSIS — C50412 Malignant neoplasm of upper-outer quadrant of left female breast: Secondary | ICD-10-CM | POA: Diagnosis not present

## 2020-12-08 DIAGNOSIS — Z170 Estrogen receptor positive status [ER+]: Secondary | ICD-10-CM | POA: Diagnosis not present

## 2020-12-14 ENCOUNTER — Encounter: Payer: Self-pay | Admitting: *Deleted

## 2020-12-14 DIAGNOSIS — Z170 Estrogen receptor positive status [ER+]: Secondary | ICD-10-CM

## 2020-12-14 DIAGNOSIS — C50412 Malignant neoplasm of upper-outer quadrant of left female breast: Secondary | ICD-10-CM

## 2020-12-16 ENCOUNTER — Ambulatory Visit: Payer: Medicare HMO | Attending: General Surgery | Admitting: Physical Therapy

## 2020-12-16 ENCOUNTER — Inpatient Hospital Stay (HOSPITAL_BASED_OUTPATIENT_CLINIC_OR_DEPARTMENT_OTHER): Payer: Medicare HMO | Admitting: Genetic Counselor

## 2020-12-16 ENCOUNTER — Encounter: Payer: Self-pay | Admitting: Oncology

## 2020-12-16 ENCOUNTER — Encounter: Payer: Self-pay | Admitting: Genetic Counselor

## 2020-12-16 ENCOUNTER — Encounter: Payer: Self-pay | Admitting: Licensed Clinical Social Worker

## 2020-12-16 ENCOUNTER — Encounter: Payer: Self-pay | Admitting: Physical Therapy

## 2020-12-16 ENCOUNTER — Ambulatory Visit
Admission: RE | Admit: 2020-12-16 | Discharge: 2020-12-16 | Disposition: A | Discharge status: Home / Self Care | Payer: Medicare HMO | Source: Ambulatory Visit | Attending: Radiation Oncology | Admitting: Radiation Oncology

## 2020-12-16 ENCOUNTER — Telehealth: Payer: Self-pay | Admitting: *Deleted

## 2020-12-16 ENCOUNTER — Other Ambulatory Visit: Payer: Self-pay

## 2020-12-16 ENCOUNTER — Inpatient Hospital Stay: Payer: Medicare HMO | Attending: Oncology

## 2020-12-16 ENCOUNTER — Inpatient Hospital Stay (HOSPITAL_BASED_OUTPATIENT_CLINIC_OR_DEPARTMENT_OTHER): Payer: Medicare HMO | Admitting: Oncology

## 2020-12-16 VITALS — BP 177/71 | HR 68 | Temp 97.9°F | Resp 18 | Ht 61.0 in | Wt 134.3 lb

## 2020-12-16 DIAGNOSIS — Z170 Estrogen receptor positive status [ER+]: Secondary | ICD-10-CM | POA: Insufficient documentation

## 2020-12-16 DIAGNOSIS — Z803 Family history of malignant neoplasm of breast: Secondary | ICD-10-CM | POA: Insufficient documentation

## 2020-12-16 DIAGNOSIS — Z800 Family history of malignant neoplasm of digestive organs: Secondary | ICD-10-CM | POA: Diagnosis not present

## 2020-12-16 DIAGNOSIS — Z808 Family history of malignant neoplasm of other organs or systems: Secondary | ICD-10-CM | POA: Insufficient documentation

## 2020-12-16 DIAGNOSIS — R293 Abnormal posture: Secondary | ICD-10-CM

## 2020-12-16 DIAGNOSIS — C50412 Malignant neoplasm of upper-outer quadrant of left female breast: Secondary | ICD-10-CM

## 2020-12-16 HISTORY — DX: Family history of malignant neoplasm of digestive organs: Z80.0

## 2020-12-16 HISTORY — DX: Family history of malignant neoplasm of breast: Z80.3

## 2020-12-16 LAB — CBC WITH DIFFERENTIAL (CANCER CENTER ONLY)
Abs Immature Granulocytes: 0.01 10*3/uL (ref 0.00–0.07)
Basophils Absolute: 0.1 10*3/uL (ref 0.0–0.1)
Basophils Relative: 1 %
Eosinophils Absolute: 0.4 10*3/uL (ref 0.0–0.5)
Eosinophils Relative: 6 %
HCT: 44.3 % (ref 36.0–46.0)
Hemoglobin: 14.7 g/dL (ref 12.0–15.0)
Immature Granulocytes: 0 %
Lymphocytes Relative: 24 %
Lymphs Abs: 1.6 10*3/uL (ref 0.7–4.0)
MCH: 31.5 pg (ref 26.0–34.0)
MCHC: 33.2 g/dL (ref 30.0–36.0)
MCV: 95.1 fL (ref 80.0–100.0)
Monocytes Absolute: 0.6 10*3/uL (ref 0.1–1.0)
Monocytes Relative: 9 %
Neutro Abs: 4.1 10*3/uL (ref 1.7–7.7)
Neutrophils Relative %: 60 %
Platelet Count: 309 10*3/uL (ref 150–400)
RBC: 4.66 MIL/uL (ref 3.87–5.11)
RDW: 12.8 % (ref 11.5–15.5)
WBC Count: 6.8 10*3/uL (ref 4.0–10.5)
nRBC: 0 % (ref 0.0–0.2)

## 2020-12-16 LAB — CMP (CANCER CENTER ONLY)
ALT: 13 U/L (ref 0–44)
AST: 16 U/L (ref 15–41)
Albumin: 4 g/dL (ref 3.5–5.0)
Alkaline Phosphatase: 97 U/L (ref 38–126)
Anion gap: 9 (ref 5–15)
BUN: 9 mg/dL (ref 8–23)
CO2: 28 mmol/L (ref 22–32)
Calcium: 8.8 mg/dL — ABNORMAL LOW (ref 8.9–10.3)
Chloride: 106 mmol/L (ref 98–111)
Creatinine: 0.73 mg/dL (ref 0.44–1.00)
GFR, Estimated: >60 mL/min (ref >60)
Glucose, Bld: 107 mg/dL — ABNORMAL HIGH (ref 70–99)
Potassium: 3.8 mmol/L (ref 3.5–5.1)
Sodium: 143 mmol/L (ref 135–145)
Total Bilirubin: 0.5 mg/dL (ref 0.3–1.2)
Total Protein: 7.6 g/dL (ref 6.5–8.1)

## 2020-12-16 LAB — GENETIC SCREENING ORDER

## 2020-12-16 MED ORDER — ANASTROZOLE 1 MG PO TABS: 1.0000 mg | ORAL_TABLET | Freq: Every day | ORAL | 4 refills | Status: DC

## 2020-12-16 NOTE — Patient Instructions (Signed)

## 2020-12-16 NOTE — Progress Notes (Signed)
[facility] Work  Initial Assessment   Gina Hunt is a 75 y.o. year old female accompanied by patient and niece- Neoma Laming. Clinical Social Work was referred by [facility] for assessment of psychosocial needs.   SDOH (Social Determinants of Health) assessments performed: Yes SDOH Interventions   Flowsheet Row Most Recent Value  SDOH Interventions   Food Insecurity Interventions Intervention Not Indicated  Transportation Interventions Intervention Not Indicated      Distress Screen completed: Yes ONCBCN DISTRESS SCREENING 12/16/2020  Screening Type Initial Screening  Distress experienced in past week (1-10) 10  Practical problem type Housing  Emotional problem type Nervousness/Anxiety;Adjusting to illness  Information Concerns Type Lack of info about diagnosis      Family/Social Information:  . Housing Arrangement: patient lives with husband, April Holding is currently on hospice (at home) with hospice nurses coming in to help . Family members/support persons in your life? Family, Friends, and [employer] . Transportation concerns: no  . Employment: Retired. Income source: Conservation officer, historic buildings . Financial concerns: No o Type of concern: None . Food access concerns: no . Religious or spiritual practice: yes, involved in church community . Services Currently in place:  Hospice for husband  Coping/ Adjustment to diagnosis: . Patient understands treatment plan and what happens next? yes, feels better after speaking with her team and hearing her options . Concerns about diagnosis and/or treatment: How I will care for other members of my family . Patient reported stressors: Adjusting to my illness. Keeping things going at home with husband on home hospice . Hopes and priorities: to be able to continue to care for husband  . Current coping skills/ strengths: Average or above average intelligence, Capable of independent living, Religious Affiliation and Supportive family/friends     SUMMARY: Current SDOH Barriers:  . No significant SDOH barriers noted today  Clinical Social Work Clinical Goal(s):  . no clinical SW goals at this time  Interventions: . Discussed common feeling and emotions when being diagnosed with cancer, and the importance of support during treatment . Informed patient of the support team roles and support services at [facility] . Provided CSW contact information and encouraged patient to call with any questions or concerns   Follow Up Plan: Patient will contact CSW with any support or resource needs Patient verbalizes understanding of plan: Yes    Christeen Douglas , LCSW

## 2020-12-16 NOTE — Progress Notes (Addendum)
REFERRING PROVIDER: Chauncey Cruel, MD [address] [place],  [place] [address]  PRIMARY PROVIDER:  [name], DO  PRIMARY REASON FOR VISIT:  Encounter Diagnoses  Name Primary?  . Family history of breast cancer   . Family history of gastric cancer   . Malignant neoplasm of upper-outer quadrant of left breast in female, estrogen receptor positive (Gina Hunt) Yes      I connected with Gina Hunt on 12/16/2020 at 11:30am EDT by Webex video conference and verified that I am speaking with the correct person using two identifiers.   Patient location: [facility] Provider location: [place] OF PRESENT ILLNESS:   Gina Hunt, a 75 y.o. female, was seen for a [place] cancer genetics consultation during the breast multidisciplinary clinic at the request of Gina Hunt due to a personal and family history of cancer.  Gina Hunt presents to clinic today with her niece to discuss the possibility of a hereditary predisposition to cancer, to discuss genetic testing, and to further clarify her future cancer risks, as well as potential cancer risks for family members.   In February 2022, at the age of 78, Gina Hunt was diagnosed with invasive ductal carcinoma of the left breast (ER+/PR+/HER2-). The preliminary treatment plan includes lumpectomy, Oncotype to determine potential benefit of chemotherapy, adjuvant radiation, and anti-estrogens.   CANCER HISTORY:  Oncology History  Malignant neoplasm of upper-outer quadrant of left breast in female, estrogen receptor positive ([name])  12/14/2020 Initial Diagnosis   Malignant neoplasm of upper-outer quadrant of left breast in female, estrogen receptor positive ([name])   12/16/2020 Cancer Staging   Staging form: Breast, AJCC 8th Edition - Clinical stage from 12/16/2020: Stage IIA (cT2, cN0, cM0, G3, ER+, PR+, HER2-) - Signed by Gina Cruel, MD on 12/16/2020 Stage prefix: Initial diagnosis     RISK FACTORS:   Menarche was at age 19.  First live birth at age 62.  OCP use for approximately 0 years.  Ovaries intact: unknown.  Hysterectomy: yes.  Menopausal status: postmenopausal; last period around age 71  HRT use: 0 years. Colonoscopy: yes; most recent in 2020. Mammogram within the last year: yes. Up to date with pelvic exams: most recent PAP 10 years ago.  Past Medical History:  Diagnosis Date  . Breast cancer ([place])   . Family history of breast cancer 12/16/2020  . Family history of gastric cancer 12/16/2020  . Fibromyalgia   . GERD (gastroesophageal reflux disease)   . Hyperlipidemia   . Hypothyroidism   . Osteoarthritis   . Osteoarthritis   . Skin cancer     Past Surgical History:  Procedure Laterality Date  . ABDOMINAL HYSTERECTOMY    . COLONOSCOPY  11/24/2015  . LIVER SURGERY    . TONSILLECTOMY      Social History   Socioeconomic History  . Marital status: Unknown    Spouse name: Not on file  . Number of children: Not on file  . Years of education: Not on file  . Highest education level: Not on file  Occupational History  . Not on file  Tobacco Use  . Smoking status: Never Smoker  . Smokeless tobacco: Never Used  Vaping Use  . Vaping Use: Never used  Substance and Sexual Activity  . Alcohol use: Not Currently    Comment: rarely   . Drug use: Never  . Sexual activity: Not Currently    Birth control/protection: Surgical  Other Topics Concern  . Not on file  Social History Narrative  . Not on file   Social Determinants of Health   Financial Resource Strain: Not on file  Food Insecurity: No Food Insecurity  . Worried About Charity fundraiser in the Last Year: Never true  . Ran Out of Food in the Last Year: Never true  Transportation Needs: No Transportation Needs  . Lack of Transportation (Medical): No  . Lack of Transportation (Non-Medical): No  Physical Activity: Not on file  Stress: Not on file  Social Connections: Not on file     FAMILY HISTORY:   We obtained a detailed, 4-generation family history.  Significant diagnoses are listed below: Family History  Problem Relation Age of Onset  . Stomach cancer Brother 7  . Stomach cancer Brother 25  . Esophageal cancer Brother 46  . Breast cancer Maternal Aunt 70  . Breast cancer Maternal Aunt 72  . Breast cancer Maternal Aunt 73    Gina Hunt has one daughter, age 24, without a cancer history.  Gina Hunt has 6 brothers and 1 sister.  Her brother, Gina Hunt, has a history of stomach and esophageal cancer at age 110.  Her brother Gina Hunt was diagnosed with stomach cancer at age 64 and died at age 15.    Gina Hunt's mother died at age 50 and did not have cancer.  Three of Gina Hunt's maternal aunts had breast cancer in their 27s.  No other maternal family history of cancer was reported.   Gina Hunt's father died at age 73 and did not have cancer.  No paternal family history of cancer was reported.   Gina Hunt is unaware of previous family history of genetic testing for hereditary cancer risks. Patient's maternal ancestors are of White/Caucasian descent, and paternal ancestors are of White/Caucasian descent. There is no reported Ashkenazi Jewish ancestry. There is no known consanguinity.  GENETIC COUNSELING ASSESSMENT: Gina Hunt is a 75 y.o. female with a personal and family history of cancer which is somewhat suggestive of a hereditary cancer syndrome and predisposition to cancer given the presence of related cancers in the family. We, therefore, discussed and recommended the following at today's visit.   DISCUSSION: We discussed that 5 - 10% of cancer is hereditary, with most cases of hereditary breast cancer associated with mutations in BRCA1/2.  There are other genes that can be associated with hereditary breast and gastric cancer syndromes.  Type of cancer risk and level of risk are gene-specific.  We discussed that testing is beneficial for several reasons including knowing how to  follow individuals after completing their treatment, identifying whether potential treatment options would be beneficial, and understanding if other family members could be at risk for cancer and allowing them to undergo genetic testing.   We reviewed the characteristics, features and inheritance patterns of hereditary cancer syndromes. We also discussed genetic testing, including the appropriate family members to test, the process of testing, insurance coverage and turn-around-time for results. We discussed the implications of a negative, positive and/or variant of uncertain significant result. In order to get genetic test results in a timely manner so that Gina Hunt can use these genetic test results for surgical decisions, we recommended Gina Hunt pursue genetic testing for the [place] BRCAPlus STAT Panel.  The BRCAplus panel offered by [employer] and includes sequencing and deletion/duplication analysis for the following 8 genes: ATM, BRCA1, BRCA2, CDH1, CHEK2, PALB2, PTEN, and TP53.  Once complete, we recommend Gina Hunt pursue reflex genetic testing to a more comprehensive gene  panel.   Gina Hunt  was offered a common hereditary cancer panel (47 genes) and an expanded pan-cancer panel (77 genes). Gina Hunt was informed of the benefits and limitations of each panel, including that expanded pan-cancer panels contain genes that do not have clear management guidelines at this point in time.  We also discussed that as the number of genes included on a panel increases, the chances of variants of uncertain significance increases.  After considering the benefits and limitations of each gene panel, Gina Hunt  elected to have a common hereditary cancer panel through [facility].  The CustomNext-Cancer+RNAinsight panel offered by [facility] includes sequencing and rearrangement analysis for the following 47 genes:  APC, ATM, AXIN2, BARD1, BMPR1A, BRCA1, BRCA2, BRIP1, CDH1, CDK4, CDKN2A, CHEK2, DICER1,  EPCAM, GREM1, HOXB13, MEN1, MLH1, MSH2, MSH3, MSH6, MUTYH, NBN, NF1, NF2, NTHL1, PALB2, PMS2, POLD1, POLE, PTEN, RAD51C, RAD51D, RECQL, RET, SDHA, SDHAF2, SDHB, SDHC, SDHD, SMAD4, SMARCA4, STK11, TP53, TSC1, TSC2, and VHL.  RNA data is routinely analyzed for use in variant interpretation for all genes.  Based on Ms. Janney's personal and family history of breast cancer, she meets medical criteria for genetic testing. Despite that she meets criteria, she may still have an out of pocket cost. We discussed that if her out of pocket cost for testing is over $100, the laboratory should contact them to discuss self-pay prices, patient pay assistance programs, if applicable, and other billing options.   PLAN: After considering the risks, benefits, and limitations, Ms. Tenorio provided informed consent to pursue genetic testing and the blood sample was sent to [facility] for analysis of the [place] +RNAinsight Panel. Results should be available within approximately 1-2 weeks' time, at which point they will be disclosed by telephone to Ms. Weinmann, as will any additional recommendations warranted by these results. Ms. Bento will receive a summary of her genetic counseling visit and a copy of her results once available. This information will also be available in Epic.   Lastly, we encouraged Ms. Youngren to remain in contact with cancer genetics annually so that we can continuously update the family history and inform her of any changes in cancer genetics and testing that may be of benefit for this family.   Ms. Lobosco's questions were answered to her satisfaction today. Our contact information was provided should additional questions or concerns arise. Thank you for the referral and allowing [place] to share in the care of your patient.   Niley Helbig M. Joette Catching, [place], [facility] Genetic Counselor [email or web address] (P) [phone]  The patient was seen for a total of 15 minutes in audio  and video genetic counseling.  Drs. Magrinat, Lindi Adie and/or Burr Medico were available to discuss this case as needed.  _______________________________________________________________________ For Office Staff:  Number of people involved in session: 1 Was an Intern/ student involved with case: no

## 2020-12-16 NOTE — Progress Notes (Signed)
Radiation Oncology         [phone] ________________________________  Name: Gina Hunt        MRN: [id number]  Date of Service: 12/16/2020 DOB: 02/28/1946  [place]:[id number], Crosby Oyster, DO  Rolm Bookbinder, MD     REFERRING PHYSICIAN: Rolm Bookbinder, MD   DIAGNOSIS: The encounter diagnosis was Malignant neoplasm of upper-outer quadrant of left breast in female, estrogen receptor positive ([place]).   HISTORY OF PRESENT ILLNESS: Gina Hunt is a 75 y.o. female seen in the multidisciplinary breast clinic for a new diagnosis of left breast cancer. The patient was noted to have screening detected mass and calcification s int he left breast. She had diagnostic imaging that revealed a 2.5 cm mass in the 2-3:00 position and subsequent ultrasound measured this at 2:00 2.5 cm and her axilla was negative. A biopsy on 12/08/20 revealed a grade 3 invasive ductal carcinoma that was ER/PR positive, HER2 was negative by FISH and her Ki 67 was 30%. She's seen today to discuss treatment of her cancer.    PREVIOUS RADIATION THERAPY: No   PAST MEDICAL HISTORY:  Past Medical History:  Diagnosis Date  . Fibromyalgia   . GERD (gastroesophageal reflux disease)   . Hyperlipidemia   . Hypothyroidism   . Osteoarthritis   . Osteoarthritis        PAST SURGICAL HISTORY: Past Surgical History:  Procedure Laterality Date  . ABDOMINAL HYSTERECTOMY    . COLONOSCOPY  11/24/2015  . LIVER SURGERY    . TONSILLECTOMY       FAMILY HISTORY:  Family History  Problem Relation Age of Onset  . Hypertension Mother   . Diabetes Mother   . Arthritis Mother   . Heart disease Mother   . Rheum arthritis Father   . Diabetes Father   . Heart disease Father   . Esophageal cancer Brother   . Cancer Brother        stomach   . Stomach cancer Brother   . Esophageal cancer Brother   . Colon cancer Neg Hx   . Colon polyps Neg Hx      SOCIAL HISTORY:  reports that she has never smoked. She has never  used smokeless tobacco. She reports previous alcohol use. She reports that she does not use drugs. The patient is married and lives in [place], [place].   ALLERGIES: Codeine and Morphine and related   MEDICATIONS:  Current Outpatient Medications  Medication Sig Dispense Refill  . Calcium Carbonate (CALCIUM 600 PO) Take 1 tablet by mouth 2 (two) times daily.    . Coenzyme Q10 60 MG CAPS Take 1 capsule by mouth 3 (three) times daily with meals.    . DULoxetine (CYMBALTA) 60 MG capsule Take 1 capsule (60 mg total) by mouth 2 (two) times daily. 60 capsule 5  . gabapentin (NEURONTIN) 300 MG capsule Take 1 capsule (300 mg total) by mouth 3 (three) times daily. 270 capsule 1  . levothyroxine (SYNTHROID) 50 MCG tablet Take 1 tablet (50 mcg total) by mouth daily before breakfast. 90 tablet 2  . Nutritional Supplements (ESTROVEN PO) Take by mouth.    Marland Kitchen omeprazole (PRILOSEC) 40 MG capsule TAKE 1 CAPSULE DAILY 90 capsule 1  . simvastatin (ZOCOR) 40 MG tablet Take 1 tablet (40 mg total) by mouth daily. 30 tablet 0  . thiamine (VITAMIN B-1) 100 MG tablet Take 100 mg by mouth daily with supper.    . Vitamin D, Cholecalciferol, 400 units CAPS Take 1 capsule  by mouth 2 (two) times daily with a meal.     No current facility-administered medications for this encounter.     REVIEW OF SYSTEMS: On review of systems, the patient reports that she is doing well overall. She denies any significant concerns since her biopsy. No no other complaints are verbalized.  PHYSICAL EXAM:  Wt Readings from Last 3 Encounters:  12/20/19 131 lb 6 oz (59.6 kg)  05/24/19 133 lb (60.3 kg)  05/08/19 136 lb (61.7 kg)   Temp Readings from Last 3 Encounters:  12/20/19 97.7 F (36.5 C) (Temporal)  05/24/19 98.1 F (36.7 C) (Oral)  05/08/19 98.5 F (36.9 C) (Temporal)   BP Readings from Last 3 Encounters:  12/20/19 140/86  05/24/19 140/80  05/08/19 (!) 153/79   Pulse Readings from Last 3 Encounters:  12/20/19 72  05/24/19  79  05/08/19 67    In general this is a well appearing caucasian female in no acute distress. She's alert and oriented x4 and appropriate throughout the examination. Cardiopulmonary assessment is negative for acute distress and she exhibits normal effort. Bilateral breast exam is deferred.    ECOG = 0  0 - Asymptomatic (Fully active, able to carry on all predisease activities without restriction)  1 - Symptomatic but completely ambulatory (Restricted in physically strenuous activity but ambulatory and able to carry out work of a light or sedentary nature. For example, light housework, office work)  2 - Symptomatic, <50% in bed during the day (Ambulatory and capable of all self care but unable to carry out any work activities. Up and about more than 50% of waking hours)  3 - Symptomatic, >50% in bed, but not bedbound (Capable of only limited self-care, confined to bed or chair 50% or more of waking hours)  4 - Bedbound (Completely disabled. Cannot carry on any self-care. Totally confined to bed or chair)  5 - Death   Eustace Pen MM, Creech RH, Tormey DC, et al. [phone]). "Toxicity and response criteria of the [facility]". [place] Oncol. 5 (6): 649-55    LABORATORY DATA:  No results found for: WBC, HGB, HCT, MCV, PLT Lab Results  Component Value Date   NA 144 12/20/2019   K 4.2 12/20/2019   CL 106 12/20/2019   CO2 27 12/20/2019   Lab Results  Component Value Date   ALT 20 12/20/2019   AST 23 12/20/2019   BILITOT 0.5 12/20/2019      RADIOGRAPHY: No results found.     IMPRESSION/PLAN: 1. Stage IIA, cT2N0M0 grade 3, ER/PR positive invasive ductal carcinoma of the left breast. I discussed the pathology findings and reviews the nature of left breast disease. The consensus from the breast conference includes breast conservation with lumpectomy with  sentinel node biopsy. Dr. Jana Hakim anticipates Oncotype Dx score to determine a role for systemic therapy.  Provided that chemotherapy is not indicated, the patient's course would then be followed by external radiotherapy to the breast  to reduce risks of local recurrence followed by antiestrogen therapy. We discussed the risks, benefits, short, and long term effects of radiotherapy, as well as the curative intent, and the patient is interested in proceeding. I discussed the delivery and logistics of radiotherapy and anticipates a course of 4 or 6 1/2 weeks of radiotherapy, hopefully 4 weeks based on her current work up. We will see her back a few weeks after surgery to discuss the simulation process and anticipate we starting radiotherapy about 4-6 weeks after surgery.  2. Possible genetic predisposition to malignancy. The patient is a candidate for genetic testing given her personal and family history. She was offered referral.   ________________________________   Jodelle Gross, MD, PhD   The patient was seen in person today in clinic.  The total time spent on the patient's visit today was 45 minutes, including chart review, direct discussion/evaluation with the patient, and coordination of care.    **Disclaimer: This note was dictated with voice recognition software. Similar sounding words can inadvertently be transcribed and this note may contain transcription errors which may not have been corrected upon publication of note.**

## 2020-12-16 NOTE — Therapy (Signed)
[place], [place], [address] Phone: [phone]   Fax:  [phone]  Physical Therapy Evaluation  Patient Details  Name: Gina Hunt MRN: [id number] Date of Birth: [birth year] Referring Provider (PT): Dr. Rolm Bookbinder   Encounter Date: 12/16/2020   PT End of Session - 12/16/20 1146    Visit Number 1    Number of Visits 2    Date for PT Re-Evaluation 02/10/21    PT Start Time 1046    PT Stop Time 1111    PT Time Calculation (min) 25 min    Activity Tolerance Patient tolerated treatment well    Behavior During Therapy [facility] for tasks assessed/performed           Past Medical History:  Diagnosis Date  . Breast cancer ([place])   . Fibromyalgia   . GERD (gastroesophageal reflux disease)   . Hyperlipidemia   . Hypothyroidism   . Osteoarthritis   . Osteoarthritis   . Skin cancer     Past Surgical History:  Procedure Laterality Date  . ABDOMINAL HYSTERECTOMY    . COLONOSCOPY  11/24/2015  . LIVER SURGERY    . TONSILLECTOMY      There were no vitals filed for this visit.    Subjective Assessment - 12/16/20 1136    Subjective Patient reports she is here today to be seen by her medical team for her newly diagnosed left breast cancer.    Patient is accompained by: Family member    Pertinent History Patient was diagnosed on 11/24/2020 with left grade III invasive ductal carcinoma breast cancer. It measures 2.5 cm and is located in the upper outer quadrant. It is ER/PR positive and HER2 negative with a Ki67 of 30%.    Patient Stated Goals Decrease lymphedema risk and learn post op shoulder ROM HEP    Currently in Pain? No/denies              [facility] PT Assessment - 12/16/20 0001      Assessment   Medical Diagnosis Left breast cancer    Referring Provider (PT) Dr. Rolm Bookbinder    Onset Date/Surgical Date 11/24/20    Hand Dominance Right    Prior Therapy none      Precautions    Precautions Other (comment)    Precaution Comments active breast cancer      Restrictions   Weight Bearing Restrictions No      Balance Screen   Has the patient fallen in the past 6 months No    Has the patient had a decrease in activity level because of a fear of falling?  No    Is the patient reluctant to leave their home because of a fear of falling?  No      Home Ecologist residence    Living Arrangements Spouse/significant other    Available Help at Discharge Family      Prior Function   Level of [place] Retired    Leisure She does not exercise      Cognition   Overall Cognitive Status Within Functional Limits for tasks assessed      Posture/Postural Control   Posture/Postural Control Postural limitations    Postural Limitations Rounded Shoulders;Forward head;Increased thoracic kyphosis      ROM / Strength   AROM / PROM / Strength AROM;Strength      AROM   AROM Assessment Site Shoulder;Cervical  Right/Left Shoulder Right;Left    Right Shoulder Extension 41 Degrees    Right Shoulder Flexion 144 Degrees    Right Shoulder ABduction 144 Degrees    Right Shoulder Internal Rotation 39 Degrees    Right Shoulder External Rotation 72 Degrees    Left Shoulder Extension 38 Degrees    Left Shoulder Flexion 138 Degrees    Left Shoulder ABduction 149 Degrees    Left Shoulder Internal Rotation 60 Degrees    Left Shoulder External Rotation 74 Degrees    Cervical Flexion WNL    Cervical Extension 75% limited    Cervical - Right Side Bend 75% limited    Cervical - Left Side Bend 75% limited    Cervical - Right Rotation 50% limited    Cervical - Left Rotation 50% limited      Strength   Overall Strength Within functional limits for tasks performed             LYMPHEDEMA/ONCOLOGY QUESTIONNAIRE - 12/16/20 0001      Type   Cancer Type Left breast cancer      Lymphedema Assessments   Lymphedema Assessments  Upper extremities      Right Upper Extremity Lymphedema   10 cm Proximal to Olecranon Process 25.5 cm    Olecranon Process 22.8 cm    10 cm Proximal to Ulnar Styloid Process 20.1 cm    Just Proximal to Ulnar Styloid Process 14.3 cm    Across Hand at [employer] 18.2 cm    At [place] of 2nd Digit 6.1 cm      Left Upper Extremity Lymphedema   10 cm Proximal to Olecranon Process 25.2 cm    Olecranon Process 22.5 cm    10 cm Proximal to Ulnar Styloid Process 19.1 cm    Just Proximal to Ulnar Styloid Process 14 cm    Across Hand at [employer] 17.3 cm    At [place] of 2nd Digit 5.7 cm           L-DEX FLOWSHEETS - 12/16/20 1100      L-DEX LYMPHEDEMA SCREENING   Measurement Type Unilateral    L-DEX MEASUREMENT EXTREMITY Upper Extremity    POSITION  Standing    DOMINANT SIDE Right    At Risk Side Left    BASELINE SCORE (UNILATERAL) -5.3           The patient was assessed using the L-Dex machine today to produce a lymphedema index baseline score. The patient will be reassessed on a regular basis (typically every 3 months) to obtain new L-Dex scores. If the score is > 6.5 points away from his/her baseline score indicating onset of subclinical lymphedema, it will be recommended to wear a compression garment for 4 weeks, 12 hours per day and then be reassessed. If the score continues to be > 6.5 points from baseline at reassessment, we will initiate lymphedema treatment. Assessing in this manner has a 95% rate of preventing clinically significant lymphedema.      Katina Dung - 12/16/20 0001    Open a tight or new jar Mild difficulty    Do heavy household chores (wash walls, wash floors) No difficulty    Carry a shopping bag or briefcase No difficulty    Wash your back No difficulty    Use a knife to cut food No difficulty    Recreational activities in which you take some force or impact through your arm, shoulder, or hand (golf, hammering, tennis) Moderate difficulty  During the  past week, to what extent has your arm, shoulder or hand problem interfered with your normal social activities with family, friends, neighbors, or groups? Not at all    During the past week, to what extent has your arm, shoulder or hand problem limited your work or other regular daily activities Not at all    Arm, shoulder, or hand pain. Mild    Tingling (pins and needles) in your arm, shoulder, or hand None    Difficulty Sleeping No difficulty    DASH Score 9.09 %            Objective measurements completed on examination: See above findings.         Patient was instructed today in a home exercise program today for post op shoulder range of motion. These included active assist shoulder flexion in sitting, scapular retraction, wall walking with shoulder abduction, and hands behind head external rotation.  She was encouraged to do these twice a day, holding 3 seconds and repeating 5 times when permitted by her physician.          PT Education - 12/16/20 1145    Education Details Lymphedema risk reduction and post op shoulder ROM HEP    Person(s) Educated Patient;Caregiver(s)   niece   Methods Explanation;Demonstration;Handout    Comprehension Returned demonstration;Verbalized understanding               PT Long Term Goals - 12/16/20 1150      PT LONG TERM GOAL #1   Title Patient will demonstrate she has regained full shoulder ROM and function post operatively compared to baselines.    Time 8    Period Weeks    Status New    Target Date 02/10/21           Breast Clinic Goals - 12/16/20 1150      Patient will be able to verbalize understanding of pertinent lymphedema risk reduction practices relevant to her diagnosis specifically related to skin care.   Time 1    Period Days    Status Achieved      Patient will be able to return demonstrate and/or verbalize understanding of the post-op home exercise program related to regaining shoulder range of motion.    Time 1    Period Days    Status Achieved      Patient will be able to verbalize understanding of the importance of attending the postoperative After Breast Cancer Class for further lymphedema risk reduction education and therapeutic exercise.   Time 1    Period Days    Status Achieved                 Plan - 12/16/20 1147    Clinical Impression Statement Patient was diagnosed on 11/24/2020 with left grade III invasive ductal carcinoma breast cancer. It measures 2.5 cm and is located in the upper outer quadrant. It is ER/PR positive and HER2 negative with a Ki67 of 30%. Her multidisciplinary medical team met prior to her assessments to determine a recommended treatment plan. She is planning to have neoadjuvant anti-estrogen therapy because her husband is under [place] in his home. She is his sole caregiver with 2 hours of respite each week from Hospice. When the situation changes with her husband, she will undergo a left lumpectomy and sentinel node biopsy followed by radiation and anti-estrogen therapy. She will benefit from a post op PT reassessment to determine needs and from L-Dex screens every 3 months  for 2 years to detect subclinical lymphedema.    Stability/Clinical Decision Making Stable/Uncomplicated    Clinical Decision Making Low    Rehab Potential Excellent    PT Frequency --   Eval and 1 f/u visit   PT Treatment/Interventions ADLs/Self Care Home Management;Therapeutic exercise;Patient/family education    PT Next Visit Plan Will reassess 3-4 weeks post op to determine needs    PT Home Exercise Plan Post op shoulder ROM HEP    Consulted and Agree with Plan of Care Patient;Family member/caregiver    Family Member Consulted Niece           Patient will benefit from skilled therapeutic intervention in order to improve the following deficits and impairments:  Postural dysfunction,Decreased range of motion,Decreased knowledge of precautions,Impaired UE functional  use,Pain  Visit Diagnosis: Malignant neoplasm of upper-outer quadrant of left breast in female, estrogen receptor positive ([place]) - Plan: PT plan of care cert/re-cert  Abnormal posture - Plan: PT plan of care cert/re-cert   Patient will follow up at outpatient cancer rehab 3-4 weeks following surgery.  If the patient requires physical therapy at that time, a specific plan will be dictated and sent to the referring physician for approval. The patient was educated today on appropriate basic range of motion exercises to begin post operatively and the importance of attending the After Breast Cancer class following surgery.  Patient was educated today on lymphedema risk reduction practices as it pertains to recommendations that will benefit the patient immediately following surgery.  She verbalized good understanding.      Problem List Patient Active Problem List   Diagnosis Date Noted  . Malignant neoplasm of upper-outer quadrant of left breast in female, estrogen receptor positive ([place]) 12/14/2020  . DNR (do not resuscitate) 12/20/2019  . Hyperlipidemia 11/12/2018  . Stress incontinence 10/12/2018  . Other fatigue 05/19/2017  . Dyslipidemia 03/08/2017  . History of peptic ulcer 03/06/2017  . History of hypothyroidism 03/06/2017  . Fibromyalgia 09/06/2016  . Primary osteoarthritis of both knees 09/06/2016  . Primary osteoarthritis of both hands 09/06/2016  . DJD (degenerative joint disease), cervical 09/06/2016  . Peptic ulcer disease 09/06/2016  . Hypothyroidism 09/06/2016   Annia Friendly, PT 12/16/20 11:53 AM  [place], [place], [address] Phone: [phone]   Fax:  [phone]  Name: Gina Hunt MRN: [id number] Date of Birth: Dec 30, 1945

## 2020-12-16 NOTE — Progress Notes (Signed)
[place]  [phone] Fax:([phone]) [phone]     ID: Gina Hunt DOB: 1945-12-25  MR#: [id number]  KZL#:[id number]  Patient Care Team: [name], DO as PCP - General (Family Medicine) Rockwell Germany, RN as Oncology Nurse Navigator Mauro Kaufmann, RN as Oncology Nurse Navigator Rolm Bookbinder, MD as Consulting Physician (General Surgery) Rasheem Figiel, [name], MD as Consulting Physician (Oncology) Kyung Rudd, MD as Consulting Physician (Radiation Oncology) Sheryn Bison, MD as Consulting Physician (Dermatology) Chauncey Cruel, MD OTHER MD:  CHIEF COMPLAINT: Estrogen receptor positive breast cancer  CURRENT TREATMENT: Neoadjuvant anastrozole   HISTORY OF CURRENT ILLNESS: [name] (pronounced "Erlene Quan") had routine screening mammography on 11/24/2020 showing a possible abnormality in the left breast. She underwent left breast ultrasonography at [facility] on 12/02/2020 showing: breast density category A; 2.5 cm mass in left breast at 2 o'clock (measuring 1.7 cm in ultrasound); no abnormal left axillary lymph nodes.  Accordingly on 12/08/2020 she proceeded to biopsy of the left breast area in question. The pathology from this procedure ([id number]) showed: invasive ductal carcinoma, grade 3. Prognostic indicators significant for: estrogen receptor, 95% positive with strong staining intensity and progesterone receptor, 80% positive with moderate staining intensity. Proliferation marker Ki67 at 30%. HER2 equivocal by immunohistochemistry (2+), but negative by fluorescent in situ hybridization with a signals ratio 1.64 and number per cell 2.95.  Cancer Staging Malignant neoplasm of upper-outer quadrant of left breast in female, estrogen receptor positive ([place]) Staging form: Breast, AJCC 8th Edition - Clinical stage from 12/16/2020: Stage IIA (cT2, cN0, cM0, G3, ER+, PR+, HER2-) - Signed by Chauncey Cruel, MD on 12/16/2020 Stage prefix:  Initial diagnosis   The patient's subsequent history is as detailed below.   INTERVAL HISTORY: Gina Hunt was evaluated in the multidisciplinary breast cancer clinic on 12/16/2020 accompanied by her niece [name]. Her case was also presented at the multidisciplinary breast cancer conference on the same day. At that time a preliminary plan was proposed: Breast conserving surgery with sentinel lymph node sampling, Oncotype, adjuvant radiation, antiestrogens, and genetics.  However this plan was modified for social reasons discussed below   REVIEW OF SYSTEMS: There were no specific symptoms leading to the original mammogram, which was routinely scheduled. On the provided questionnaire, Gina Hunt reports wearing glasses, tinnitus, history of skin cancer, and thyroid problems. The patient denies unusual headaches, visual changes, nausea, vomiting, stiff neck, dizziness, or gait imbalance. There has been no cough, phlegm production, or pleurisy, no chest pain or pressure, and no change in bowel or bladder habits. The patient denies fever, rash, bleeding, unexplained fatigue or unexplained weight loss. A detailed review of systems was otherwise entirely negative.   COVID 19 VACCINATION STATUS: [name] x2, no booster as of 12/2020   PAST MEDICAL HISTORY: Past Medical History:  Diagnosis Date  . Breast cancer ([place])   . Family history of breast cancer 12/16/2020  . Family history of gastric cancer 12/16/2020  . Fibromyalgia   . GERD (gastroesophageal reflux disease)   . Hyperlipidemia   . Hypothyroidism   . Osteoarthritis   . Osteoarthritis   . Skin cancer     PAST SURGICAL HISTORY: Past Surgical History:  Procedure Laterality Date  . ABDOMINAL HYSTERECTOMY    . COLONOSCOPY  11/24/2015  . LIVER SURGERY    . TONSILLECTOMY      FAMILY HISTORY: Family History  Problem Relation Age of Onset  . Hypertension Mother   . Diabetes Mother   .  Arthritis Mother   . Heart disease Mother   . Rheum  arthritis Father   . Diabetes Father   . Heart disease Father   . Stomach cancer Brother 93  . Stomach cancer Brother 78  . Esophageal cancer Brother 62  . Breast cancer Maternal Aunt 70  . Breast cancer Maternal Aunt 72  . Breast cancer Maternal Aunt 73  . Colon cancer Neg Hx   . Colon polyps Neg Hx   Her father and mother both died from MI, father age 13 and mother age 81. [name] has 6 brothers and 1 sister. She reports breast cancer in 3 maternal aunts, one in their 26's. One of her maternal aunt's daughter (Gina Hunt's cousin) had breast cancer at age 46.The patient also reports stomach cancer in two of her brothers in their 33's, one of whom also had esophageal cancer.    GYNECOLOGIC HISTORY:  No LMP recorded. Patient has had a hysterectomy. Menarche: 75 years old Age at first live birth: 75 years old [place] P 1 LMP age 73 Contraceptive: never used HRT never used  Hysterectomy? yes BSO? yes   SOCIAL HISTORY: (updated 12/2020)  Gwendolyne is currently retired from working at the [employer] in [place] is currently under hospice (for RA, vasculitis, and history of Agent Orange exposure). Aleah is his primary caregiver. She lives at home with Gina Hunt. Daughter Gina Hunt lives in [place], the patient believes, but they have been alienated for many years.    ADVANCED DIRECTIVES: not in place; intends to name her niece, [name], as her HCPOA.  The patient was given the appropriate documents to complete and notarized at her discretion on her 12/16/2020 visit   HEALTH MAINTENANCE: Social History   Tobacco Use  . Smoking status: Never Smoker  . Smokeless tobacco: Never Used  Vaping Use  . Vaping Use: Never used  Substance Use Topics  . Alcohol use: Not Currently    Comment: rarely   . Drug use: Never     Colonoscopy: 05/2019 (Dr. Bryan Lemma), recall 2027  PAP: "10 years ago" (~2012)  Bone density: 2020   Allergies  Allergen Reactions  . Codeine  Other (See Comments)    Hallucination   . Morphine And Related Other (See Comments)    Knocks me out per pt    Current Outpatient Medications  Medication Sig Dispense Refill  . DULoxetine (CYMBALTA) 60 MG capsule Take 1 capsule (60 mg total) by mouth 2 (two) times daily. 60 capsule 5  . gabapentin (NEURONTIN) 300 MG capsule Take 1 capsule (300 mg total) by mouth 3 (three) times daily. 270 capsule 1  . levothyroxine (SYNTHROID) 50 MCG tablet Take 1 tablet (50 mcg total) by mouth daily before breakfast. 90 tablet 2  . simvastatin (ZOCOR) 40 MG tablet Take 1 tablet (40 mg total) by mouth daily. 30 tablet 0  . Calcium Carbonate (CALCIUM 600 PO) Take 1 tablet by mouth 2 (two) times daily. (Patient not taking: Reported on 12/16/2020)    . Coenzyme Q10 60 MG CAPS Take 1 capsule by mouth 3 (three) times daily with meals. (Patient not taking: Reported on 12/16/2020)    . Nutritional Supplements (ESTROVEN PO) Take by mouth. (Patient not taking: Reported on 12/16/2020)    . omeprazole (PRILOSEC) 40 MG capsule TAKE 1 CAPSULE DAILY (Patient not taking: Reported on 12/16/2020) 90 capsule 1  . thiamine (VITAMIN B-1) 100 MG tablet Take 100 mg by mouth daily with supper. (Patient not taking:  Reported on 12/16/2020)    . Vitamin D, Cholecalciferol, 400 units CAPS Take 1 capsule by mouth 2 (two) times daily with a meal. (Patient not taking: Reported on 12/16/2020)     No current facility-administered medications for this visit.    OBJECTIVE: White woman who appears stated age  30:   12/16/20 0915  BP: (!) 177/71  Pulse: 68  Resp: 18  Temp: 97.9 F (36.6 C)  SpO2: 98%     Body mass index is 25.38 kg/m.   Wt Readings from Last 3 Encounters:  12/16/20 134 lb 4.8 oz (60.9 kg)  12/20/19 131 lb 6 oz (59.6 kg)  05/24/19 133 lb (60.3 kg)      ECOG FS:1 - Symptomatic but completely ambulatory  Ocular: Sclerae unicteric, pupils round and equal Ear-nose-throat: Wearing a mask Lymphatic: No cervical or  supraclavicular adenopathy Lungs no rales or rhonchi Heart regular rate and rhythm Abd soft, nontender, positive bowel sounds MSK no focal spinal tenderness, no joint edema Neuro: non-focal, well-oriented, appropriate affect Breasts: The right breast is unremarkable.  The left breast is status post recent biopsy.  There is a small ecchymosis.  Both axillae are benign   LAB RESULTS:  CMP     Component Value Date/Time   NA 143 12/16/2020 0900   K 3.8 12/16/2020 0900   CL 106 12/16/2020 0900   CO2 28 12/16/2020 0900   GLUCOSE 107 (H) 12/16/2020 0900   BUN 9 12/16/2020 0900   CREATININE 0.73 12/16/2020 0900   CREATININE 0.58 (L) 12/20/2019 1422   CALCIUM 8.8 (L) 12/16/2020 0900   PROT 7.6 12/16/2020 0900   ALBUMIN 4.0 12/16/2020 0900   AST 16 12/16/2020 0900   ALT 13 12/16/2020 0900   ALKPHOS 97 12/16/2020 0900   BILITOT 0.5 12/16/2020 0900   GFRNONAA >60 12/16/2020 0900    No results found for: TOTALPROTELP, ALBUMINELP, A1GS, A2GS, BETS, BETA2SER, GAMS, MSPIKE, SPEI  Lab Results  Component Value Date   WBC 6.8 12/16/2020   NEUTROABS 4.1 12/16/2020   HGB 14.7 12/16/2020   HCT 44.3 12/16/2020   MCV 95.1 12/16/2020   PLT 309 12/16/2020    No results found for: LABCA2  No components found for: IONGEX528  No results for input(s): INR in the last 168 hours.  No results found for: LABCA2  No results found for: UXL244  No results found for: WNU272  No results found for: ZDG644  No results found for: CA2729  No components found for: HGQUANT  No results found for: CEA1 / No results found for: CEA1   No results found for: AFPTUMOR  No results found for: CHROMOGRNA  No results found for: KPAFRELGTCHN, LAMBDASER, KAPLAMBRATIO (kappa/lambda light chains)  No results found for: HGBA, HGBA2QUANT, HGBFQUANT, HGBSQUAN (Hemoglobinopathy evaluation)   No results found for: LDH  No results found for: IRON, TIBC, IRONPCTSAT (Iron and TIBC)  No results found for:  FERRITIN  Urinalysis No results found for: COLORURINE, APPEARANCEUR, LABSPEC, PHURINE, GLUCOSEU, HGBUR, BILIRUBINUR, KETONESUR, PROTEINUR, UROBILINOGEN, NITRITE, LEUKOCYTESUR   STUDIES: No results found.   ELIGIBLE FOR AVAILABLE RESEARCH PROTOCOL: no  ASSESSMENT: 75 y.o. [place], [place] woman status post left breast upper outer quadrant biopsy 12/08/2020 for a clinical T2N0, stage IIA invasive ductal carcinoma, grade 3, estrogen and progesterone receptor positive, HER-2 not amplified, with an MIB-1 of 30%  (1) Oncotype to be obtained from the original biopsy  (2) genetics testing  (3) anastrozole started neoadjuvantly 12/16/2020  (4) definitive surgery to follow  (  5) adjuvant chemotherapy as appropriate  (6) adjuvant radiation  PLAN: I met today with Inna to review her new diagnosis. Specifically we discussed the biology of her breast cancer, its diagnosis, staging, treatment  options and prognosis. We first reviewed the fact that cancer is not one disease but more than 100 different diseases and that it is important to keep them separate-- otherwise when friends and relatives discuss their own cancer experiences with [place] confusion can result. Similarly we explained that if breast cancer spreads to the bone or liver, the patient would not have bone cancer or liver cancer, but breast cancer in the bone and breast cancer in the liver: one cancer in three places-- not 3 different cancers which otherwise would have to be treated in 3 different ways.  We discussed the difference between local and systemic therapy. In terms of loco-regional treatment, lumpectomy plus radiation is equivalent to mastectomy as far as survival is concerned. For this reason, and because the cosmetic results are generally superior, we recommend breast conserving surgery.   We also noted that in terms of sequencing of treatments, whether systemic therapy or surgery is done first does not affect the ultimate outcome.   This is relevant to the patient's case as we will be starting anastrozole for some time instead of surgery.  The reason for this is that the patient's husband is currently at home under hospice and the patient is his primary caregiver.  She does not feel she could go through surgery or chemotherapy while caring for him and there is really no one else that could care for him at this point.  We did discuss the rationale for systemic therapy. There is some risk that this cancer may have already spread to other parts of her body. Patients frequently ask at this point about bone scans, CAT scans and PET scans to find out if they have occult breast cancer somewhere else. The problem is that in early stage disease we are much more likely to find false positives then true cancers and this would expose the patient to unnecessary procedures as well as unnecessary radiation. Scans cannot answer the question the patient really would like to know, which is whether she has microscopic disease elsewhere in her body. For those reasons we do not recommend them.  Of course we would proceed to aggressive evaluation of any symptoms that might suggest metastatic disease, but that is not the case here.  Next we went over the options for systemic therapy which are anti-estrogens, anti-HER-2 immunotherapy, and chemotherapy. Vollie does not meet criteria for anti-HER-2 immunotherapy. She is a good candidate for anti-estrogens.  The question of chemotherapy is more complicated. Chemotherapy is most effective in rapidly growing, aggressive tumors. It is much less effective in low-grade, slow growing cancers. Destyn 's breast cancer is high-grade and on the fast side on its proliferation fraction so we are requesting an Oncotype from the original biopsy (we will not be able to obtain it from the definitive surgery because the patient is being treated neoadjuvantly).  Arantza also qualifies for genetics testing. In patients who carry a  deleterious mutation [for example in a  BRCA gene], the risk of a new breast cancer developing in the future may be sufficiently great that the patient may choose bilateral mastectomies. However if she wishes to keep her breasts in that situation it is safe to do so. That would require intensified screening, which generally means not only yearly mammography but a yearly breast MRI  as well.   The plan then is to start with anastrozole and I gave the patient information on the possible toxicities side effects and complications of this agent.  She will start it tonight or tomorrow and let us know if she develops any unusual side effects or complications.  I will set her up for a virtual visit in approximately 2 months to make sure everything is going well and also to update the situation at home.  Gissel has a good understanding of the overall plan. She agrees with it. She knows the goal of treatment in her case is cure. She will call with any problems that may develop before her next visit here.  Encounter time 65 minutes.Sarajane Jews C. Normand Damron, MD 12/16/2020 5:57 PM Medical Oncology and Hematology [facility] [place], [place] [address] Tel. [phone]    Fax. [phone]   This document serves as a record of services personally performed by Lurline Del, MD. It was created on his behalf by Wilburn Mylar, a trained medical scribe. The creation of this record is based on the scribe's personal observations and the provider's statements to them.   I, Lurline Del MD, have reviewed the above documentation for accuracy and completeness, and I agree with the above.    *Total Encounter Time as defined by the Centers for Medicare and Medicaid Services includes, in addition to the face-to-face time of a patient visit (documented in the note above) non-face-to-face time: obtaining and reviewing outside history, ordering and reviewing medications, tests or procedures,  care coordination (communications with other health care professionals or caregivers) and documentation in the medical record.

## 2020-12-16 NOTE — Telephone Encounter (Signed)
Ordered oncotype (core) per Dr. Jana Hakim. Faxed requisition to pathology.

## 2020-12-17 ENCOUNTER — Telehealth: Payer: Self-pay | Admitting: Oncology

## 2020-12-17 NOTE — Telephone Encounter (Signed)
Scheduled appts per 2/16 los. Pt confirmed appt date, time, and that it's virtual.

## 2020-12-21 ENCOUNTER — Encounter: Payer: Self-pay | Admitting: *Deleted

## 2020-12-21 ENCOUNTER — Telehealth: Payer: Self-pay | Admitting: *Deleted

## 2020-12-21 NOTE — Telephone Encounter (Signed)
Left vm regarding [place] on 2.16.22. Contact information provided for questions or needs.

## 2020-12-29 ENCOUNTER — Encounter: Payer: Self-pay | Admitting: Genetic Counselor

## 2020-12-29 ENCOUNTER — Telehealth: Payer: Self-pay | Admitting: Genetic Counselor

## 2020-12-29 DIAGNOSIS — Z170 Estrogen receptor positive status [ER+]: Secondary | ICD-10-CM | POA: Diagnosis not present

## 2020-12-29 DIAGNOSIS — Z1379 Encounter for other screening for genetic and chromosomal anomalies: Secondary | ICD-10-CM | POA: Insufficient documentation

## 2020-12-29 DIAGNOSIS — C50412 Malignant neoplasm of upper-outer quadrant of left female breast: Secondary | ICD-10-CM | POA: Diagnosis not present

## 2020-12-29 NOTE — Telephone Encounter (Signed)
Revealed negative genetic testing.  Discussed that we do not know why she has breast cancer or why there is cancer in the family. It could be due to a different gene that we are not testing, or maybe our current technology may not be able to pick something up.  It will be important for her to keep in contact with genetics to keep up with whether additional testing may be needed.  Results of 47 gene panel pending.

## 2020-12-31 ENCOUNTER — Telehealth: Payer: Self-pay | Admitting: *Deleted

## 2020-12-31 ENCOUNTER — Encounter: Payer: Self-pay | Admitting: *Deleted

## 2020-12-31 NOTE — Telephone Encounter (Signed)
Received oncotype score of 32 on CORE bx. Physician team notified

## 2021-01-01 ENCOUNTER — Encounter: Payer: Self-pay | Admitting: *Deleted

## 2021-01-01 ENCOUNTER — Telehealth: Payer: Self-pay | Admitting: *Deleted

## 2021-01-01 NOTE — Telephone Encounter (Signed)
Left vm with appointment date and time of 3/8 at 12:30 to discuss oncotype results. Contact information provided for questions or needs.

## 2021-01-04 NOTE — Progress Notes (Signed)
[facility]  [phone] [phone]     ID: Gina Hunt DOB: 11/05/1945  MR#: [id number]  TWS#:[id number]  Patient Care Team: [name], DO as PCP - General (Family Medicine) Rockwell Germany, RN as Oncology Nurse Navigator Mauro Kaufmann, RN as Oncology Nurse Navigator Rolm Bookbinder, MD as Consulting Physician (General Surgery) Chantay Whitelock, [name], MD as Consulting Physician (Oncology) Kyung Rudd, MD as Consulting Physician (Radiation Oncology) Sheryn Bison, MD as Consulting Physician (Dermatology) Chauncey Cruel, MD OTHER MD:  CHIEF COMPLAINT: Estrogen receptor positive breast cancer  CURRENT TREATMENT: Neoadjuvant anastrozole   INTERVAL HISTORY: Gina Hunt returns today for follow up of Gina estrogen receptor positive breast cancer. Gina Hunt was evaluated in the multidisciplinary breast cancer clinic on 12/16/2020.  Gina Hunt is accompanied by Gina Hunt Gina Hunt was started neoadjuvantly on anastrozole at Gina first visit here.  Gina Hunt has no hot flashes or arthralgias Gina Hunt.  Gina Hunt does have some vaginal dryness issues with this  Gina Hunt underwent genetic testing during clinic. The first half of the results, consisting of BRCA 1 and 2, was negative. The rest of the panel is pending.  Oncotype DX was obtained on the core biopsy sample and the recurrence score of 32 predicts a risk of recurrence outside the breast over the next 9 years of 20%  if the patient's only systemic therapy is an antiestrogen for 5 years.  It also predicts significant benefit from chemotherapy. Gina Hunt is here to discuss these results.   REVIEW OF SYSTEMS: Gina Hunt tells me Gina husband died a week ago.  This is very difficult.  Gina Hunt married at 53, right after hi he came back from [place].  It is really just been the 2 of them all these years.  The last 3 weeks were extremely difficult even though Gina Hunt did have hospice help.  Gina Hunt does have 2 dogs and that is helping.  A  detailed review of systems today was otherwise noncontributory   COVID 19 VACCINATION STATUS: [place] x2, no booster as of 12/2020   HISTORY OF CURRENT ILLNESS: From the original intake note:  [name] had routine screening mammography on 11/24/2020 showing a possible abnormality in the left breast. Gina Hunt underwent left breast ultrasonography at [facility] on 12/02/2020 showing: breast density category A; 2.5 cm mass in left breast at 2 o'clock (measuring 1.7 cm in ultrasound); no abnormal left axillary lymph nodes.  Accordingly on 12/08/2020 Gina Hunt proceeded to biopsy of the left breast area in question. The pathology from this procedure ([id number]) showed: invasive ductal carcinoma, grade 3. Prognostic indicators significant for: estrogen receptor, 95% positive with strong staining intensity and progesterone receptor, 80% positive with moderate staining intensity. Proliferation marker Ki67 at 30%. HER2 equivocal by immunohistochemistry (2+), but negative by fluorescent in situ hybridization with a signals ratio 1.64 and number per cell 2.95.  Cancer Staging Malignant neoplasm of upper-outer quadrant of left breast in female, estrogen receptor positive ([place]) Staging form: Breast, AJCC 8th Edition - Clinical stage from 12/16/2020: Stage IIA (cT2, cN0, cM0, G3, ER+, PR+, HER2-) - Signed by Chauncey Cruel, MD on 12/16/2020 Stage prefix: Initial diagnosis  The patient's subsequent history is as detailed below.   PAST MEDICAL HISTORY: Past Medical History:  Diagnosis Date  . Breast cancer ([place])   . Family history of breast cancer 12/16/2020  . Family history of gastric cancer 12/16/2020  . Fibromyalgia   . GERD (gastroesophageal reflux disease)   . Hyperlipidemia   .  Hypothyroidism   . Osteoarthritis   . Osteoarthritis   . Skin cancer     PAST SURGICAL HISTORY: Past Surgical History:  Procedure Laterality Date  . ABDOMINAL HYSTERECTOMY    . COLONOSCOPY  11/24/2015  .  LIVER SURGERY    . TONSILLECTOMY      FAMILY HISTORY: Family History  Problem Relation Age of Onset  . Hypertension Mother   . Diabetes Mother   . Arthritis Mother   . Heart disease Mother   . Rheum arthritis Father   . Diabetes Father   . Heart disease Father   . Stomach cancer Brother 46  . Stomach cancer Brother 64  . Esophageal cancer Brother 12  . Breast cancer Maternal Aunt 70  . Breast cancer Maternal Aunt 72  . Breast cancer Maternal Aunt 73  . Colon cancer Neg Hx   . Colon polyps Neg Hx   Gina father and mother both died from MI, father age 74 and mother age 70. Yanna has 6 brothers and 1 sister. Gina Hunt reports breast cancer in 3 maternal aunts, one in their 85's. One of Gina maternal aunt's daughter (Gina Hunt) had breast cancer at age 56.The patient also reports stomach cancer in two of Gina brothers in their 40's, one of whom also had esophageal cancer.    GYNECOLOGIC HISTORY:  No LMP recorded. Patient has had a hysterectomy. Menarche: 75 years old Age at first live birth: 75 years old [place] P 1 LMP age 19 Contraceptive: never used HRT never used  Hysterectomy? yes BSO? yes   SOCIAL HISTORY: (updated 12/2020)  Dezarae is currently retired from working at the [employer] in [place] died late February 6962 from complications of Agent Orange exposure . Gina Hunt lives at home with Gina Hunt. Daughter Gina Hunt lives in [place], the patient believes, but they have been alienated for many years.    ADVANCED DIRECTIVES: not in place; intends to name Gina Hunt, Gina Hunt, as Gina HCPOA.  The patient was given the appropriate documents to complete and notarized at Gina discretion on Gina 12/16/2020 visit   HEALTH MAINTENANCE: Social History   Tobacco Use  . Smoking status: Never Smoker  . Smokeless tobacco: Never Used  Vaping Use  . Vaping Use: Never used  Substance Use Topics  . Alcohol use: Not Currently    Comment: rarely   . Drug use:  Never     Colonoscopy: 05/2019 (Dr. Bryan Lemma), recall 2027  PAP: "10 years ago" (~2012)  Bone density: 2020   Allergies  Allergen Reactions  . Codeine Other (See Comments)    Hallucination   . Morphine And Related Other (See Comments)    Knocks me out per pt    Current Outpatient Medications  Medication Sig Dispense Refill  . anastrozole (ARIMIDEX) 1 MG tablet Take 1 tablet (1 mg total) by mouth daily. 90 tablet 4  . Calcium Carbonate (CALCIUM 600 PO) Take 1 tablet by mouth 2 (two) times daily. (Patient not taking: Reported on 12/16/2020)    . Coenzyme Q10 60 MG CAPS Take 1 capsule by mouth 3 (three) times daily with meals. (Patient not taking: Reported on 12/16/2020)    . DULoxetine (CYMBALTA) 60 MG capsule Take 1 capsule (60 mg total) by mouth 2 (two) times daily. 60 capsule 5  . gabapentin (NEURONTIN) 300 MG capsule Take 1 capsule (300 mg total) by mouth 3 (three) times daily. 270 capsule 1  . levothyroxine (SYNTHROID) 50 MCG tablet Take 1  tablet (50 mcg total) by mouth daily before breakfast. 90 tablet 2  . Nutritional Supplements (ESTROVEN PO) Take by mouth. (Patient not taking: Reported on 12/16/2020)    . omeprazole (PRILOSEC) 40 MG capsule TAKE 1 CAPSULE DAILY (Patient not taking: Reported on 12/16/2020) 90 capsule 1  . simvastatin (ZOCOR) 40 MG tablet Take 1 tablet (40 mg total) by mouth daily. 30 tablet 0  . thiamine (VITAMIN B-1) 100 MG tablet Take 100 mg by mouth daily with supper. (Patient not taking: Reported on 12/16/2020)    . Vitamin D, Cholecalciferol, 400 units CAPS Take 1 capsule by mouth 2 (two) times daily with a meal. (Patient not taking: Reported on 12/16/2020)     No current facility-administered medications for this visit.    OBJECTIVE: White Hunt who appears stated age  12:   01/05/21 1220  BP: (!) 169/88  Pulse: 66  Resp: 18  Temp: 97.7 F (36.5 C)  SpO2: 99%     Body mass index is 25.04 kg/m.   Wt Readings from Last 3 Encounters:  01/05/21 132  lb 8 oz (60.1 kg)  12/16/20 134 lb 4.8 oz (60.9 kg)  12/20/19 131 lb 6 oz (59.6 kg)      ECOG FS:1 - Symptomatic but completely ambulatory  Sclerae unicteric, EOMs intact Wearing a mask No cervical or supraclavicular adenopathy Lungs no rales or rhonchi Heart regular rate and rhythm Abd soft, nontender, positive bowel sounds MSK no focal spinal tenderness, no upper extremity lymphedema Neuro: nonfocal, well oriented, appropriate affect Breasts: The mass in the left breast is easily palpable.  It does not involve skin or nipple.   LAB RESULTS:  CMP     Component Value Date/Time   NA 143 12/16/2020 0900   K 3.8 12/16/2020 0900   CL 106 12/16/2020 0900   CO2 28 12/16/2020 0900   GLUCOSE 107 (H) 12/16/2020 0900   BUN 9 12/16/2020 0900   CREATININE 0.73 12/16/2020 0900   CREATININE 0.58 (L) 12/20/2019 1422   CALCIUM 8.8 (L) 12/16/2020 0900   PROT 7.6 12/16/2020 0900   ALBUMIN 4.0 12/16/2020 0900   AST 16 12/16/2020 0900   ALT 13 12/16/2020 0900   ALKPHOS 97 12/16/2020 0900   BILITOT 0.5 12/16/2020 0900   GFRNONAA >60 12/16/2020 0900    No results found for: TOTALPROTELP, ALBUMINELP, A1GS, A2GS, BETS, BETA2SER, GAMS, MSPIKE, SPEI  Lab Results  Component Value Date   WBC 6.8 12/16/2020   NEUTROABS 4.1 12/16/2020   HGB 14.7 12/16/2020   HCT 44.3 12/16/2020   MCV 95.1 12/16/2020   PLT 309 12/16/2020    No results found for: LABCA2  No components found for: [id number]  No results for input(s): INR in the last 168 hours.  No results found for: LABCA2  No results found for: OVF643  No results found for: PIR518  No results found for: ACZ660  No results found for: CA2729  No components found for: HGQUANT  No results found for: CEA1 / No results found for: CEA1   No results found for: AFPTUMOR  No results found for: CHROMOGRNA  No results found for: KPAFRELGTCHN, LAMBDASER, KAPLAMBRATIO (kappa/lambda light chains)  No results found for: HGBA,  HGBA2QUANT, HGBFQUANT, HGBSQUAN (Hemoglobinopathy evaluation)   No results found for: LDH  No results found for: IRON, TIBC, IRONPCTSAT (Iron and TIBC)  No results found for: FERRITIN  Urinalysis No results found for: COLORURINE, APPEARANCEUR, LABSPEC, PHURINE, GLUCOSEU, HGBUR, BILIRUBINUR, KETONESUR, PROTEINUR, UROBILINOGEN, NITRITE, LEUKOCYTESUR   STUDIES:  No results found.   ELIGIBLE FOR AVAILABLE RESEARCH PROTOCOL: no  ASSESSMENT: 75 y.o. Gina Hunt, Gina Hunt status post left breast upper outer quadrant biopsy 12/08/2020 for a clinical T2N0, stage IIA invasive ductal carcinoma, grade 3, estrogen and progesterone receptor positive, Gina-2 not amplified, with an MIB-1 of 30%  (1) Oncotype obtained from the original biopsy shows a score of 32, predicting a risk of recurrence outside the breast in the next 9 years of 20% if node-negative, 25% if node positive, if antiestrogens are the only systemic treatment, also predicting a greater than 15% benefit from chemotherapy  (2) genetics testing in process  (3) anastrozole started neoadjuvantly 12/16/2020  (4) definitive surgery to follow  (5) adjuvant chemotherapy (CMF)  to follow surgery  (6) adjuvant radiation   PLAN: I discussed the Oncotype result extensively with Gina Hunt and Gina Hunt.  They understand the need for chemotherapy and they are in agreement with proceeding with chemo at the appropriate time.  That is going to be cyclophosphamide, methotrexate, and fluorouracil.  Gina Hunt understands that Gina Hunt will need a port.  Normally we start chemotherapy after surgery and that still the plan.  However Gina Hunt does not feel Gina Hunt is ready for surgery at this point.  Gina Hunt needs to get over Gina husband's recent death first.  Accordingly the plan is to continue anastrozole until surgery, hopefully in another 3 to 4 months.  After that Gina Hunt will start CMF.  Gina Hunt will have Gina radiation treatments with the third through 6 cycles of CMF and when Gina Hunt  completes the chemotherapy Gina Hunt will go back to anastrozole and completed total of 5 years on that medication  In the meantime I have urged Gina to walk every day once or twice.  Luckily Gina Hunt does have 2 dogs and Gina Hunt is very attached to them.  Gina Hunt also will make sure that Gina Hunt grieves appropriately but also takes care of all Gina needs.  Gina Hunt has an appointment with Gina surgeon Dr. Donne Hazel 02/01/2021.  Gina Hunt has a virtual visit with me 02/16/2021.  Gina Hunt knows to call for any other issue that may develop before then  Total encounter time 25 minutes.Sarajane Jews C. Tyauna Lacaze, MD 01/05/2021 12:24 PM Medical Oncology and Hematology [facility] [place], [place] [address] Tel. [phone]    Fax. [phone]   This document serves as a record of services personally performed by Lurline Del, MD. It was created on his behalf by Wilburn Mylar, a trained medical scribe. The creation of this record is based on the scribe's personal observations and the provider's statements to them.   I, Lurline Del MD, have reviewed the above documentation for accuracy and completeness, and I agree with the above.   *Total Encounter Time as defined by the Centers for Medicare and Medicaid Services includes, in addition to the face-to-face time of a patient visit (documented in the note above) non-face-to-face time: obtaining and reviewing outside history, ordering and reviewing medications, tests or procedures, care coordination (communications with other health care professionals or caregivers) and documentation in the medical record.

## 2021-01-05 ENCOUNTER — Other Ambulatory Visit: Payer: Self-pay

## 2021-01-05 ENCOUNTER — Encounter: Payer: Self-pay | Admitting: *Deleted

## 2021-01-05 ENCOUNTER — Telehealth: Payer: Self-pay | Admitting: Genetic Counselor

## 2021-01-05 ENCOUNTER — Inpatient Hospital Stay: Payer: Medicare HMO | Attending: Oncology | Admitting: Oncology

## 2021-01-05 VITALS — BP 169/88 | HR 66 | Temp 97.7°F | Resp 18 | Ht 61.0 in | Wt 132.5 lb

## 2021-01-05 DIAGNOSIS — Z803 Family history of malignant neoplasm of breast: Secondary | ICD-10-CM | POA: Insufficient documentation

## 2021-01-05 DIAGNOSIS — Z170 Estrogen receptor positive status [ER+]: Secondary | ICD-10-CM

## 2021-01-05 DIAGNOSIS — C50412 Malignant neoplasm of upper-outer quadrant of left female breast: Secondary | ICD-10-CM

## 2021-01-05 NOTE — Telephone Encounter (Signed)
Contacted patient in attempt to disclose results of genetic testing of pan-cancer panel.  LVM with contact information requesting a call back.

## 2021-01-06 ENCOUNTER — Ambulatory Visit: Payer: Self-pay | Admitting: Genetic Counselor

## 2021-01-06 ENCOUNTER — Telehealth: Payer: Self-pay | Admitting: Hematology and Oncology

## 2021-01-06 DIAGNOSIS — C50412 Malignant neoplasm of upper-outer quadrant of left female breast: Secondary | ICD-10-CM

## 2021-01-06 DIAGNOSIS — Z1379 Encounter for other screening for genetic and chromosomal anomalies: Secondary | ICD-10-CM

## 2021-01-06 DIAGNOSIS — Z800 Family history of malignant neoplasm of digestive organs: Secondary | ICD-10-CM

## 2021-01-06 DIAGNOSIS — Z803 Family history of malignant neoplasm of breast: Secondary | ICD-10-CM

## 2021-01-06 NOTE — Progress Notes (Signed)
HPI:  Ms. Mcduffee was previously seen in the [facility] clinic due to a personal and family history of cancer and concerns regarding a hereditary predisposition to cancer. Please refer to our prior cancer genetics clinic note for more information regarding our discussion, assessment and recommendations, at the time. Ms. Murdock recent genetic test results were disclosed to her, as were recommendations warranted by these results. These results and recommendations are discussed in more detail below.  CANCER HISTORY:  Oncology History  Malignant neoplasm of upper-outer quadrant of left breast in female, estrogen receptor positive ([place])  12/14/2020 Initial Diagnosis   Malignant neoplasm of upper-outer quadrant of left breast in female, estrogen receptor positive ([place])   12/16/2020 Cancer Staging   Staging form: Breast, AJCC 8th Edition - Clinical stage from 12/16/2020: Stage IIA (cT2, cN0, cM0, G3, ER+, PR+, HER2-) - Signed by Chauncey Cruel, MD on 12/16/2020 Stage prefix: Initial diagnosis   12/29/2020 Genetic Testing   Negative hereditary cancer genetic testing: no pathogenic variants detected in [facility] STAT Panel.  The report date is December 29, 2020.  The BRCAplus panel offered by [employer] and includes sequencing and deletion/duplication analysis for the following 8 genes: ATM, BRCA1, BRCA2, CDH1, CHEK2, PALB2, PTEN, and TP53.    No pathogenic variants detected in [facility] CustomNext-Cancer +RNAinsight Panel.  The report date is January 01, 2021. The CustomNext-Cancer+RNAinsight panel offered by [facility] includes sequencing and rearrangement analysis for the following 47 genes:  APC, ATM, AXIN2, BARD1, BMPR1A, BRCA1, BRCA2, BRIP1, CDH1, CDK4, CDKN2A, CHEK2, DICER1, EPCAM, GREM1, HOXB13, MEN1, MLH1, MSH2, MSH3, MSH6, MUTYH, NBN, NF1, NF2, NTHL1, PALB2, PMS2, POLD1, POLE, PTEN, RAD51C, RAD51D, RECQL, RET, SDHA, SDHAF2, SDHB, SDHC, SDHD, SMAD4, SMARCA4, STK11, TP53, TSC1,  TSC2, and VHL.  RNA data is routinely analyzed for use in variant interpretation for all genes.      FAMILY HISTORY:  We obtained a detailed, 4-generation family history.  Significant diagnoses are listed below: Family History  Problem Relation Age of Onset  . Stomach cancer Brother 42  . Stomach cancer Brother 33  . Esophageal cancer Brother 73  . Breast cancer Maternal Aunt 70  . Breast cancer Maternal Aunt 72  . Breast cancer Maternal Aunt 73    Ms. Coreas has one daughter, age 40, without a cancer history.  Ms. Mahler has 6 brothers and 1 sister.  Her brother, Clair Gulling, has a history of stomach and esophageal cancer at age 37.  Her brother Delfino Lovett was diagnosed with stomach cancer at age 67 and died at age 23.    Ms. Jenning's mother died at age 65 and did not have cancer.  Three of Ms. Ashenfelter's maternal aunts had breast cancer in their 27s.  No other maternal family history of cancer was reported.   Ms. Cheaney's father died at age 28 and did not have cancer.  No paternal family history of cancer was reported.   Ms. Crotteau is unaware of previous family history of genetic testing for hereditary cancer risks. Patient's maternal ancestors are of White/Caucasian descent, and paternal ancestors are of White/Caucasian descent. There is no reported Ashkenazi Jewish ancestry. There is no known consanguinity.  GENETIC TEST RESULTS: Genetic testing reported out on January 01, 2021.  The [facility] CustomNext-Cancer +RNAinsight Panel found no pathogenic mutations. The CustomNext-Cancer+RNAinsight panel offered by [facility] includes sequencing and rearrangement analysis for the following 47 genes:  APC, ATM, AXIN2, BARD1, BMPR1A, BRCA1, BRCA2, BRIP1, CDH1, CDK4, CDKN2A, CHEK2, DICER1, EPCAM, GREM1,  HOXB13, MEN1, MLH1, MSH2, MSH3, MSH6, MUTYH, NBN, NF1, NF2, NTHL1, PALB2, PMS2, POLD1, POLE, PTEN, RAD51C, RAD51D, RECQL, RET, SDHA, SDHAF2, SDHB, SDHC, SDHD, SMAD4, SMARCA4, STK11, TP53, TSC1, TSC2, and  VHL.  RNA data is routinely analyzed for use in variant interpretation for all genes.  The test report has been scanned into EPIC and is located under the Molecular Pathology section of the Results Review tab.  A portion of the result report is included below for reference.     We discussed with Ms. Belsky that because current genetic testing is not perfect, it is possible there may be a gene mutation in one of these genes that current testing cannot detect, but that chance is small.  We also discussed, that there could be another gene that has not yet been discovered, or that we have not yet tested, that is responsible for the cancer diagnoses in the family. It is also possible there is a hereditary cause for the cancer in the family that Ms. Kahler did not inherit and therefore was not identified in her testing.  Therefore, it is important to remain in touch with cancer genetics in the future so that we can continue to offer Ms. Walsh the most up to date genetic testing.  ADDITIONAL GENETIC TESTING: We discussed with Ms. Mcarthy that there are other genes that are associated with increased cancer risk that can be analyzed. Should Ms. Farace wish to pursue additional genetic testing, we are happy to discuss and coordinate this testing, at any time.    CANCER SCREENING RECOMMENDATIONS: Ms. Nick's test result is considered negative (normal).  This means that we have not identified a hereditary cause for her personal history of cancer at this time. Most cancers happen by chance and this negative test suggests that her cancer may fall into this category.    While reassuring, this does not definitively rule out a hereditary predisposition to cancer. It is still possible that there could be genetic mutations that are undetectable by current technology. There could be genetic mutations in genes that have not been tested or identified to increase cancer risk.  Therefore, it is recommended she continue to  follow the cancer management and screening guidelines provided by her oncology and primary healthcare provider.   An individual's cancer risk and medical management are not determined by genetic test results alone. Overall cancer risk assessment incorporates additional factors, including personal medical history, family history, and any available genetic information that may result in a personalized plan for cancer prevention and surveillance  RECOMMENDATIONS FOR FAMILY MEMBERS:  Individuals in this family might be at some increased risk of developing cancer, over the general population risk, simply due to the family history of cancer.  We recommended women in this family have a yearly mammogram beginning at age 53, or 48 years younger than the earliest onset of cancer, an annual clinical breast exam, and perform monthly breast self-exams. Women in this family should also have a gynecological exam as recommended by their primary provider. All family members should be referred for colonoscopy starting at age 46.  It is also possible there is a hereditary cause for the cancer in Ms. Morrow's family that she did not inherit and therefore was not identified in her.  Based on Ms. Emley's family history of breast cancer in her maternal aunts, we recommended her maternal cousins have genetic counseling and testing. Ms. Barrientez will let us know if we can be of any assistance in coordinating genetic counseling  and/or testing for these family members.   FOLLOW-UP: Lastly, we discussed with Ms. Standing that cancer genetics is a rapidly advancing field and it is possible that new genetic tests will be appropriate for her and/or her family members in the future. We encouraged her to remain in contact with cancer genetics on an annual basis so we can update her personal and family histories and let her know of advances in cancer genetics that may benefit this family.   Our contact number was provided. Ms. Boehne's  questions were answered to her satisfaction, and she knows she is welcome to call us at anytime with additional questions or concerns.    Marvetta Vohs M. Joette Catching, [place], [facility] Genetic Counselor [email or web address] (P) [phone]

## 2021-01-06 NOTE — Telephone Encounter (Signed)
Revealed negative genetic testing of pan-cancer panel.  Discussed that we do not know why she has breast cancer or why there is cancer in the family. It could be sporadic/familial, due to a different gene that we are not testing, or maybe our current technology may not be able to pick something up.  It will be important for her to keep in contact with genetics to keep up with whether additional testing may be needed.   

## 2021-01-08 ENCOUNTER — Encounter: Payer: Self-pay | Admitting: Oncology

## 2021-01-13 NOTE — Telephone Encounter (Signed)
Do you have any form on this patient or her husband? I have not seen anything on robins desk.

## 2021-01-15 NOTE — Telephone Encounter (Signed)
I just received a letter from the [facility]. They are asking if PTSD had anything to do with autonomic dysfunction, which it does not. It makes no mention of Agent Orange. Are there other documents I should be expecting?

## 2021-01-18 ENCOUNTER — Telehealth: Payer: Self-pay

## 2021-01-18 NOTE — Telephone Encounter (Signed)
Please advise 

## 2021-01-18 NOTE — Telephone Encounter (Signed)
Patient called requesting to speak with Dr. Estanislado Pandy directly.

## 2021-01-20 NOTE — Telephone Encounter (Signed)
I returned patient's call.  She notified me that her husband Gina Hunt (who was also my patient) passed away on January 07, 2021.  He had been in hospice for last 1 year.  He was given the diagnosis of autonomic dysfunction.  She wanted to know if autonomic dysfunction or autoimmune disease are related to Agent Orange.  I told her that I cannot say that his autoimmune disease was directly related to agent orange.  I forwarded my condolences to her. Gina Hunt was diagnosed with breast cancer recently and will be starting chemotherapy.  I advised her to reach out to me if I could be of any help.

## 2021-01-22 ENCOUNTER — Encounter: Payer: Self-pay | Admitting: Family Medicine

## 2021-01-26 DIAGNOSIS — J309 Allergic rhinitis, unspecified: Secondary | ICD-10-CM | POA: Diagnosis not present

## 2021-01-26 DIAGNOSIS — M1990 Unspecified osteoarthritis, unspecified site: Secondary | ICD-10-CM | POA: Diagnosis not present

## 2021-01-26 DIAGNOSIS — E039 Hypothyroidism, unspecified: Secondary | ICD-10-CM | POA: Diagnosis not present

## 2021-01-26 DIAGNOSIS — E785 Hyperlipidemia, unspecified: Secondary | ICD-10-CM | POA: Diagnosis not present

## 2021-01-26 DIAGNOSIS — K219 Gastro-esophageal reflux disease without esophagitis: Secondary | ICD-10-CM | POA: Diagnosis not present

## 2021-01-26 DIAGNOSIS — K5900 Constipation, unspecified: Secondary | ICD-10-CM | POA: Diagnosis not present

## 2021-01-26 DIAGNOSIS — R69 Illness, unspecified: Secondary | ICD-10-CM | POA: Diagnosis not present

## 2021-01-26 DIAGNOSIS — C50919 Malignant neoplasm of unspecified site of unspecified female breast: Secondary | ICD-10-CM | POA: Diagnosis not present

## 2021-01-26 DIAGNOSIS — G8929 Other chronic pain: Secondary | ICD-10-CM | POA: Diagnosis not present

## 2021-01-27 ENCOUNTER — Other Ambulatory Visit: Payer: Self-pay | Admitting: Family Medicine

## 2021-01-27 DIAGNOSIS — M797 Fibromyalgia: Secondary | ICD-10-CM

## 2021-01-28 ENCOUNTER — Other Ambulatory Visit: Payer: Self-pay | Admitting: Family Medicine

## 2021-01-29 DIAGNOSIS — C50919 Malignant neoplasm of unspecified site of unspecified female breast: Secondary | ICD-10-CM

## 2021-01-29 HISTORY — DX: Malignant neoplasm of unspecified site of unspecified female breast: C50.919

## 2021-02-01 ENCOUNTER — Other Ambulatory Visit: Payer: Self-pay | Admitting: General Surgery

## 2021-02-01 DIAGNOSIS — C50412 Malignant neoplasm of upper-outer quadrant of left female breast: Secondary | ICD-10-CM

## 2021-02-09 DIAGNOSIS — L578 Other skin changes due to chronic exposure to nonionizing radiation: Secondary | ICD-10-CM | POA: Diagnosis not present

## 2021-02-09 DIAGNOSIS — L821 Other seborrheic keratosis: Secondary | ICD-10-CM | POA: Diagnosis not present

## 2021-02-09 DIAGNOSIS — L570 Actinic keratosis: Secondary | ICD-10-CM | POA: Diagnosis not present

## 2021-02-15 NOTE — Progress Notes (Signed)
[place]  Telephone:([phone]) [phone] Fax:(336) [phone]     ID: Gina Hunt DOB: 1946-10-24  MR#: [id number]  PRX#:[id number]  Patient Care Team: [name], DO as PCP - General (Family Medicine) Rockwell Germany, RN as Oncology Nurse Navigator Mauro Kaufmann, RN as Oncology Nurse Navigator Rolm Bookbinder, MD as Consulting Physician (General Surgery) Ewen Varnell, [name], MD as Consulting Physician (Oncology) Kyung Rudd, MD as Consulting Physician ([place]) Sheryn Bison, MD as Consulting Physician (Dermatology) Aurea Graff OTHER MD:  I connected with Gina Hunt on 02/15/21 at  1:30 PM EDT by telephone visit and verified that I am speaking with the correct person using two identifiers.   I discussed the limitations, risks, security and privacy concerns of performing an evaluation and management service by telemedicine and the availability of in-person appointments. I also discussed with the patient that there may be a patient responsible charge related to this service. The patient expressed understanding and agreed to proceed.   Other persons participating in the visit and their role in the encounter: Patient's daughter  Patient's location: Home Provider's location: [place]  Total time spent: 15 min   CHIEF COMPLAINT: Estrogen receptor positive breast cancer  CURRENT TREATMENT: Neoadjuvant anastrozole   INTERVAL HISTORY: Gina Hunt was contacted today for follow up of her estrogen receptor positive breast cancer.  Her daughter also participated in the phone conversation  Gina Hunt was started neoadjuvantly on anastrozole at her first visit here.  She has no hot flashes or arthralgias Gina Hunt.  On the other hand vaginal dryness is "horrible".  That area is dry itchy and very irritated.  It is causing dysuria.  The daughter has found a hyaluronic acid cream which may be helpful and she wanted to know if it  was okay to use it.  The patient underwent genetic testing during clinic. The first half of the results, consisting of BRCA 1 and 2, was negative. The remainder returned since her last visit and was also negative.  She is scheduled for left lumpectomy on 04/07/2021 under Dr. Donne Hazel.   REVIEW OF SYSTEMS: [place] was very vague in her answers today.  Overall a detailed review of systems was negative except as above   [place]: [place] x2, no booster as of 12/2020   HISTORY OF CURRENT ILLNESS: From the original intake note:  [name] had routine screening mammography on 11/24/2020 showing a possible abnormality in the left breast. She underwent [facility] breast [facility] on 12/02/2020 showing: breast density category A; 2.5 cm mass in left breast at 2 o'clock (measuring 1.7 cm in ultrasound); no abnormal left axillary lymph nodes.  Accordingly on 12/08/2020 she proceeded to biopsy of the left breast area in question. The pathology from this procedure ([id number]) showed: invasive ductal carcinoma, grade 3. Prognostic indicators significant for: estrogen receptor, 95% positive with strong staining intensity and progesterone receptor, 80% positive with moderate staining intensity. Proliferation marker Ki67 at 30%. HER2 equivocal by immunohistochemistry (2+), but negative by fluorescent in situ hybridization with a signals ratio 1.64 and number per cell 2.95.  Cancer Staging Malignant neoplasm of upper-outer quadrant of left breast in female, estrogen receptor positive ([place]) Staging form: Breast, AJCC 8th Edition - Clinical stage from 12/16/2020: Stage IIA (cT2, cN0, cM0, G3, ER+, PR+, HER2-) - Signed by Chauncey Cruel, MD on 12/16/2020 Stage prefix: Initial diagnosis  The patient's subsequent history is as detailed below.  PAST MEDICAL HISTORY: Past Medical History:  Diagnosis Date  . Breast cancer ([place])   . Family history of breast  cancer 12/16/2020  . Family history of gastric cancer 12/16/2020  . Fibromyalgia   . GERD (gastroesophageal reflux disease)   . Hyperlipidemia   . Hypothyroidism   . Osteoarthritis   . Osteoarthritis   . Skin cancer     PAST SURGICAL HISTORY: Past Surgical History:  Procedure Laterality Date  . ABDOMINAL HYSTERECTOMY    . COLONOSCOPY  11/24/2015  . LIVER SURGERY    . TONSILLECTOMY      FAMILY HISTORY: Family History  Problem Relation Age of Onset  . Hypertension Mother   . Diabetes Mother   . Arthritis Mother   . Heart disease Mother   . Rheum arthritis Father   . Diabetes Father   . Heart disease Father   . Stomach cancer Brother 35  . Stomach cancer Brother 73  . Esophageal cancer Brother 76  . Breast cancer Maternal Aunt 70  . Breast cancer Maternal Aunt 72  . Breast cancer Maternal Aunt 73  . Colon cancer Neg Hx   . Colon polyps Neg Hx   Her father and mother both died from MI, father age 44 and mother age 94. Nikie has 6 brothers and 1 sister. She reports breast cancer in 3 maternal aunts, one in their 36's. One of her maternal aunt's daughter (Myrth's cousin) had breast cancer at age 8.The patient also reports stomach cancer in two of her brothers in their 72's, one of whom also had esophageal cancer.    GYNECOLOGIC HISTORY:  No LMP recorded. Patient has had a hysterectomy. Menarche: 75 years old Age at first live birth: 75 years old [place] P 1 LMP age 59 Contraceptive: never used HRT never used  Hysterectomy? yes BSO? yes   SOCIAL HISTORY: (updated 12/2020)  Cythia is currently retired from working at the [employer] in [place] died late February 1275 from complications of Agent Orange exposure . She lives at home with [name]. Daughter [name] lives in [place], the patient believes, but they have been alienated for many years.    ADVANCED DIRECTIVES: not in place; intends to name her niece, [name], as her HCPOA.  The  patient was given the appropriate documents to complete and notarized at her discretion on her 12/16/2020 visit   HEALTH MAINTENANCE: Social History   Tobacco Use  . Smoking status: Never Smoker  . Smokeless tobacco: Never Used  Vaping Use  . Vaping Use: Never used  Substance Use Topics  . Alcohol use: Not Currently    Comment: rarely   . Drug use: Never     Colonoscopy: 05/2019 (Dr. Bryan Lemma), recall 2027  PAP: "10 years ago" (~2012)  Bone density: 2020   Allergies  Allergen Reactions  . Codeine Other (See Comments)    Hallucination   . Morphine And Related Other (See Comments)    Knocks me out per pt    Current Outpatient Medications  Medication Sig Dispense Refill  . anastrozole (ARIMIDEX) 1 MG tablet Take 1 tablet (1 mg total) by mouth daily. 90 tablet 4  . Calcium Carbonate (CALCIUM 600 PO) Take 1 tablet by mouth 2 (two) times daily. (Patient not taking: Reported on 12/16/2020)    . Coenzyme Q10 60 MG CAPS Take 1 capsule by mouth 3 (three) times daily with meals. (Patient not taking: Reported on 12/16/2020)    . DULoxetine (CYMBALTA) 60  MG capsule Take 1 capsule by mouth twice daily 60 capsule 0  . gabapentin (NEURONTIN) 300 MG capsule Take 1 capsule (300 mg total) by mouth 3 (three) times daily. 270 capsule 1  . levothyroxine (SYNTHROID) 50 MCG tablet Take 1 tablet (50 mcg total) by mouth daily before breakfast. 90 tablet 2  . Nutritional Supplements (ESTROVEN PO) Take by mouth. (Patient not taking: Reported on 12/16/2020)    . omeprazole (PRILOSEC) 40 MG capsule TAKE 1 CAPSULE DAILY (Patient not taking: Reported on 12/16/2020) 90 capsule 1  . simvastatin (ZOCOR) 40 MG tablet Take 1 tablet by mouth once daily 30 tablet 0  . thiamine (VITAMIN B-1) 100 MG tablet Take 100 mg by mouth daily with supper. (Patient not taking: Reported on 12/16/2020)    . Vitamin D, Cholecalciferol, 400 units CAPS Take 1 capsule by mouth 2 (two) times daily with a meal. (Patient not taking:  Reported on 12/16/2020)     No current facility-administered medications for this visit.    OBJECTIVE:   There were no vitals filed for this visit.   There is no height or weight on file to calculate BMI.   Wt Readings from Last 3 Encounters:  01/05/21 132 lb 8 oz (60.1 kg)  12/16/20 134 lb 4.8 oz (60.9 kg)  12/20/19 131 lb 6 oz (59.6 kg)      ECOG FS:1 - Symptomatic but completely ambulatory  Telemedicine visit 02/16/2021  LAB RESULTS:  CMP     Component Value Date/Time   NA 143 12/16/2020 0900   K 3.8 12/16/2020 0900   CL 106 12/16/2020 0900   CO2 28 12/16/2020 0900   GLUCOSE 107 (H) 12/16/2020 0900   BUN 9 12/16/2020 0900   CREATININE 0.73 12/16/2020 0900   CREATININE 0.58 (L) 12/20/2019 1422   CALCIUM 8.8 (L) 12/16/2020 0900   PROT 7.6 12/16/2020 0900   ALBUMIN 4.0 12/16/2020 0900   AST 16 12/16/2020 0900   ALT 13 12/16/2020 0900   ALKPHOS 97 12/16/2020 0900   BILITOT 0.5 12/16/2020 0900   GFRNONAA >60 12/16/2020 0900    No results found for: TOTALPROTELP, ALBUMINELP, A1GS, A2GS, BETS, BETA2SER, GAMS, MSPIKE, SPEI  Lab Results  Component Value Date   WBC 6.8 12/16/2020   NEUTROABS 4.1 12/16/2020   HGB 14.7 12/16/2020   HCT 44.3 12/16/2020   MCV 95.1 12/16/2020   PLT 309 12/16/2020    No results found for: LABCA2  No components found for: QASTMH962  No results for input(s): INR in the last 168 hours.  No results found for: LABCA2  No results found for: IWL798  No results found for: XQJ194  No results found for: RDE081  No results found for: CA2729  No components found for: HGQUANT  No results found for: CEA1 / No results found for: CEA1   No results found for: AFPTUMOR  No results found for: CHROMOGRNA  No results found for: KPAFRELGTCHN, LAMBDASER, KAPLAMBRATIO (kappa/lambda light chains)  No results found for: HGBA, HGBA2QUANT, HGBFQUANT, HGBSQUAN (Hemoglobinopathy evaluation)   No results found for: LDH  No results found for:  IRON, TIBC, IRONPCTSAT (Iron and TIBC)  No results found for: FERRITIN  Urinalysis No results found for: COLORURINE, APPEARANCEUR, LABSPEC, PHURINE, GLUCOSEU, HGBUR, BILIRUBINUR, KETONESUR, PROTEINUR, UROBILINOGEN, NITRITE, LEUKOCYTESUR   STUDIES: No results found.   ELIGIBLE FOR AVAILABLE RESEARCH PROTOCOL: no  ASSESSMENT: 75 y.o. [place], [place] woman status post left breast upper outer quadrant biopsy 12/08/2020 for a clinical T2N0, stage IIA invasive ductal carcinoma,  grade 3, estrogen and progesterone receptor positive, HER-2 not amplified, with an MIB-1 of 30%  (1) Oncotype obtained from the original biopsy shows a score of 32, predicting a risk of recurrence outside the breast in the next 9 years of 20% if node-negative, 25% if node positive, if antiestrogens are the only systemic treatment, also predicting a greater than 15% benefit from chemotherapy  (2) genetics testing  (a) Negative hereditary cancer genetic testing: no pathogenic variants detected in [facility] CustomNext-Cancer +RNAinsight Panel.  The final report date is January 01, 2021.    The CustomNext-Cancer+RNAinsight panel offered by [facility] includes sequencing and rearrangement analysis for the following 47 genes: APC, ATM, AXIN2, BARD1, BMPR1A, BRCA1, BRCA2, BRIP1, CDH1, CDK4, CDKN2A, CHEK2, DICER1, EPCAM, GREM1, HOXB13, MEN1, MLH1, MSH2, MSH3, MSH6, MUTYH, NBN, NF1, NF2, NTHL1, PALB2, PMS2, POLD1, POLE, PTEN, RAD51C, RAD51D, RECQL, RET, SDHA, SDHAF2, SDHB, SDHC, SDHD, SMAD4, SMARCA4, STK11, TP53, TSC1, TSC2, and VHL. RNA data is routinely analyzed for use in variant interpretation for all genes.  (3) anastrozole started neoadjuvantly 12/16/2020  (4) definitive surgery pending  (5) adjuvant chemotherapy ([facility])  to follow surgery  (6) adjuvant radiation   PLAN: Deva is having significant problems from the anastrozole and we are not going to continue that beyond the time of surgery.  After the surgery of course  she will have her chemotherapy and when we return to antiestrogens we will give tamoxifen a try.  In the meantime the cream the daughter wishes to use seems safe.  There are many other alternatives and I also mentioned some of those.  I will see her after her surgery and we plan to start chemotherapy sometime in July     Binyomin Brann C. Dakhari Zuver, MD 02/15/2021 8:56 PM Medical Oncology and Hematology [facility] [place], [place] [address] Tel. [phone]    Fax. [phone]   This document serves as a record of services personally performed by Lurline Del, MD. It was created on his behalf by Wilburn Mylar, a trained medical scribe. The creation of this record is based on the scribe's personal observations and the provider's statements to them.   I, Lurline Del MD, have reviewed the above documentation for accuracy and completeness, and I agree with the above.   *Total Encounter Time as defined by the Centers for Medicare and Medicaid Services includes, in addition to the face-to-face time of a patient visit (documented in the note above) non-face-to-face time: obtaining and reviewing outside history, ordering and reviewing medications, tests or procedures, care coordination (communications with other health care professionals or caregivers) and documentation in the medical record.

## 2021-02-16 ENCOUNTER — Inpatient Hospital Stay: Payer: Medicare HMO | Attending: Oncology | Admitting: Oncology

## 2021-02-16 ENCOUNTER — Encounter: Payer: Self-pay | Admitting: *Deleted

## 2021-02-16 DIAGNOSIS — Z170 Estrogen receptor positive status [ER+]: Secondary | ICD-10-CM | POA: Diagnosis not present

## 2021-02-16 DIAGNOSIS — C50412 Malignant neoplasm of upper-outer quadrant of left female breast: Secondary | ICD-10-CM | POA: Diagnosis not present

## 2021-02-18 ENCOUNTER — Telehealth: Payer: Self-pay | Admitting: Oncology

## 2021-02-18 NOTE — Telephone Encounter (Signed)
Scheduled per 4/19 los. Called pt and left a msg

## 2021-02-22 DIAGNOSIS — N6489 Other specified disorders of breast: Secondary | ICD-10-CM | POA: Diagnosis not present

## 2021-02-22 DIAGNOSIS — R928 Other abnormal and inconclusive findings on diagnostic imaging of breast: Secondary | ICD-10-CM | POA: Diagnosis not present

## 2021-02-23 ENCOUNTER — Other Ambulatory Visit: Payer: Self-pay | Admitting: Family Medicine

## 2021-02-23 DIAGNOSIS — M797 Fibromyalgia: Secondary | ICD-10-CM

## 2021-03-08 ENCOUNTER — Encounter: Payer: Self-pay | Admitting: *Deleted

## 2021-03-10 DIAGNOSIS — M65331 Trigger finger, right middle finger: Secondary | ICD-10-CM | POA: Diagnosis not present

## 2021-03-10 DIAGNOSIS — M65321 Trigger finger, right index finger: Secondary | ICD-10-CM | POA: Diagnosis not present

## 2021-03-10 DIAGNOSIS — M79641 Pain in right hand: Secondary | ICD-10-CM | POA: Diagnosis not present

## 2021-03-11 DIAGNOSIS — C50412 Malignant neoplasm of upper-outer quadrant of left female breast: Secondary | ICD-10-CM | POA: Diagnosis not present

## 2021-03-25 ENCOUNTER — Other Ambulatory Visit: Payer: Self-pay | Admitting: Family Medicine

## 2021-03-31 ENCOUNTER — Other Ambulatory Visit: Payer: Self-pay

## 2021-03-31 ENCOUNTER — Encounter (HOSPITAL_BASED_OUTPATIENT_CLINIC_OR_DEPARTMENT_OTHER): Payer: Self-pay | Admitting: General Surgery

## 2021-04-05 ENCOUNTER — Other Ambulatory Visit (HOSPITAL_COMMUNITY): Payer: Medicare HMO

## 2021-04-05 ENCOUNTER — Encounter (HOSPITAL_BASED_OUTPATIENT_CLINIC_OR_DEPARTMENT_OTHER): Payer: Self-pay | Admitting: General Surgery

## 2021-04-05 MED ORDER — ENSURE PRE-SURGERY PO LIQD: 296.0000 mL | Freq: Once | ORAL | Status: DC

## 2021-04-05 NOTE — Progress Notes (Signed)

## 2021-04-05 NOTE — Anesthesia Preprocedure Evaluation (Addendum)
Anesthesia Evaluation  Patient identified by MRN, date of birth, ID band Patient awake    Reviewed: Allergy & Precautions, NPO status , Patient's Chart, lab work & pertinent test results  History of Anesthesia Complications (+) PONV and history of anesthetic complications  Airway Mallampati: II  TM Distance: >3 FB Neck ROM: Full    Dental no notable dental hx. (+) Teeth Intact, Caps, Dental Advisory Given   Pulmonary neg pulmonary ROS,    Pulmonary exam normal breath sounds clear to auscultation       Cardiovascular negative cardio ROS Normal cardiovascular exam Rhythm:Regular Rate:Normal     Neuro/Psych PSYCHIATRIC DISORDERS Anxiety  Neuromuscular disease    GI/Hepatic Neg liver ROS, PUD, GERD  Medicated and Controlled,  Endo/Other  Hypothyroidism Left Breast Ca  Renal/GU negative Renal ROS Bladder dysfunction  SUI    Musculoskeletal  (+) Arthritis , Osteoarthritis,  Fibromyalgia -  Abdominal   Peds  Hematology negative hematology ROS (+)   Anesthesia Other Findings   Reproductive/Obstetrics                            Anesthesia Physical Anesthesia Plan  ASA: II  Anesthesia Plan: General   Post-op Pain Management:  Regional for Post-op pain   Induction: Intravenous  PONV Risk Score and Plan:   Airway Management Planned: LMA  Additional Equipment:   Intra-op Plan:   Post-operative Plan: Extubation in OR  Informed Consent: I have reviewed the patients History and Physical, chart, labs and discussed the procedure including the risks, benefits and alternatives for the proposed anesthesia with the patient or authorized representative who has indicated his/her understanding and acceptance.   Patient has DNR.  Discussed DNR with patient and Suspend DNR.   Dental advisory given  Plan Discussed with: CRNA and Anesthesiologist  Anesthesia Plan Comments:         Anesthesia Quick Evaluation

## 2021-04-07 ENCOUNTER — Observation Stay (HOSPITAL_BASED_OUTPATIENT_CLINIC_OR_DEPARTMENT_OTHER): Payer: Medicare HMO | Admitting: Anesthesiology

## 2021-04-07 ENCOUNTER — Ambulatory Visit (HOSPITAL_BASED_OUTPATIENT_CLINIC_OR_DEPARTMENT_OTHER): Payer: Medicare HMO | Admitting: Anesthesiology

## 2021-04-07 ENCOUNTER — Other Ambulatory Visit: Payer: Self-pay

## 2021-04-07 ENCOUNTER — Ambulatory Visit (HOSPITAL_BASED_OUTPATIENT_CLINIC_OR_DEPARTMENT_OTHER): Admit: 2021-04-07 | Payer: Medicare HMO | Admitting: General Surgery

## 2021-04-07 ENCOUNTER — Encounter (HOSPITAL_BASED_OUTPATIENT_CLINIC_OR_DEPARTMENT_OTHER): Payer: Self-pay | Admitting: General Surgery

## 2021-04-07 ENCOUNTER — Ambulatory Visit (HOSPITAL_COMMUNITY)
Admission: RE | Admit: 2021-04-07 | Discharge: 2021-04-07 | Disposition: A | Discharge status: Home / Self Care | Payer: Medicare HMO | Source: Ambulatory Visit | Attending: General Surgery | Admitting: General Surgery

## 2021-04-07 ENCOUNTER — Encounter (HOSPITAL_BASED_OUTPATIENT_CLINIC_OR_DEPARTMENT_OTHER)
Admission: RE | Disposition: A | Discharge status: Home / Self Care | Payer: Self-pay | Source: Home / Self Care | Attending: General Surgery

## 2021-04-07 ENCOUNTER — Ambulatory Visit (HOSPITAL_COMMUNITY): Payer: Medicare HMO

## 2021-04-07 ENCOUNTER — Observation Stay (HOSPITAL_BASED_OUTPATIENT_CLINIC_OR_DEPARTMENT_OTHER)
Admission: RE | Admit: 2021-04-07 | Discharge: 2021-04-08 | Disposition: A | Discharge status: Home / Self Care | Payer: Medicare HMO | Attending: General Surgery | Admitting: General Surgery

## 2021-04-07 DIAGNOSIS — L7632 Postprocedural hematoma of skin and subcutaneous tissue following other procedure: Secondary | ICD-10-CM | POA: Insufficient documentation

## 2021-04-07 DIAGNOSIS — C50912 Malignant neoplasm of unspecified site of left female breast: Secondary | ICD-10-CM | POA: Diagnosis not present

## 2021-04-07 DIAGNOSIS — Z170 Estrogen receptor positive status [ER+]: Secondary | ICD-10-CM | POA: Diagnosis not present

## 2021-04-07 DIAGNOSIS — G8918 Other acute postprocedural pain: Secondary | ICD-10-CM | POA: Diagnosis not present

## 2021-04-07 DIAGNOSIS — C50412 Malignant neoplasm of upper-outer quadrant of left female breast: Secondary | ICD-10-CM

## 2021-04-07 DIAGNOSIS — E039 Hypothyroidism, unspecified: Secondary | ICD-10-CM | POA: Diagnosis not present

## 2021-04-07 DIAGNOSIS — Z452 Encounter for adjustment and management of vascular access device: Secondary | ICD-10-CM | POA: Diagnosis not present

## 2021-04-07 DIAGNOSIS — E785 Hyperlipidemia, unspecified: Secondary | ICD-10-CM | POA: Diagnosis not present

## 2021-04-07 DIAGNOSIS — Z85828 Personal history of other malignant neoplasm of skin: Secondary | ICD-10-CM | POA: Diagnosis not present

## 2021-04-07 DIAGNOSIS — Z419 Encounter for procedure for purposes other than remedying health state, unspecified: Secondary | ICD-10-CM

## 2021-04-07 DIAGNOSIS — K219 Gastro-esophageal reflux disease without esophagitis: Secondary | ICD-10-CM | POA: Diagnosis not present

## 2021-04-07 HISTORY — DX: Other specified postprocedural states: Z98.890

## 2021-04-07 HISTORY — PX: EVACUATION BREAST HEMATOMA: SHX1537

## 2021-04-07 HISTORY — DX: Other specified postprocedural states: R11.2

## 2021-04-07 HISTORY — PX: SIMPLE MASTECTOMY WITH AXILLARY SENTINEL NODE BIOPSY: SHX6098

## 2021-04-07 HISTORY — PX: PORTA CATH INSERTION: CATH118285

## 2021-04-07 HISTORY — DX: Other complications of anesthesia, initial encounter: T88.59XA

## 2021-04-07 HISTORY — DX: Anxiety disorder, unspecified: F41.9

## 2021-04-07 LAB — CBC
HCT: 38.3 % (ref 36.0–46.0)
Hemoglobin: 12.2 g/dL (ref 12.0–15.0)
MCH: 31.9 pg (ref 26.0–34.0)
MCHC: 31.9 g/dL (ref 30.0–36.0)
MCV: 100 fL (ref 80.0–100.0)
Platelets: 272 10*3/uL (ref 150–400)
RBC: 3.83 MIL/uL — ABNORMAL LOW (ref 3.87–5.11)
RDW: 13.2 % (ref 11.5–15.5)
WBC: 14.1 10*3/uL — ABNORMAL HIGH (ref 4.0–10.5)
nRBC: 0 % (ref 0.0–0.2)

## 2021-04-07 LAB — POCT HEMOGLOBIN-HEMACUE: Hemoglobin: 11 g/dL — ABNORMAL LOW (ref 12.0–15.0)

## 2021-04-07 LAB — TYPE AND SCREEN
ABO/RH(D): A POS
Antibody Screen: NEGATIVE

## 2021-04-07 SURGERY — EVACUATION, HEMATOMA, BREAST
Anesthesia: General | Site: Breast | Laterality: Left

## 2021-04-07 SURGERY — SIMPLE MASTECTOMY WITH AXILLARY SENTINEL NODE BIOPSY
Anesthesia: General | Site: Chest | Laterality: Right

## 2021-04-07 MED ORDER — HEPARIN (PORCINE) IN NACL 1000-0.9 UT/500ML-% IV SOLN
INTRAVENOUS | Status: AC
  Filled 2021-04-07: qty 500

## 2021-04-07 MED ORDER — LABETALOL HCL 5 MG/ML IV SOLN
5.0000 mg | INTRAVENOUS | Status: AC | PRN
  Administered 2021-04-07 (×2): 5 mg via INTRAVENOUS

## 2021-04-07 MED ORDER — LACTATED RINGERS IV SOLN: INTRAVENOUS | Status: DC | PRN

## 2021-04-07 MED ORDER — TECHNETIUM TC 99M TILMANOCEPT KIT
1.0000 | PACK | Freq: Once | INTRAVENOUS | Status: AC | PRN
  Administered 2021-04-07: 1 via INTRADERMAL

## 2021-04-07 MED ORDER — DULOXETINE HCL 60 MG PO CPEP
60.0000 mg | ORAL_CAPSULE | Freq: Two times a day (BID) | ORAL | Status: DC
  Filled 2021-04-07: qty 1

## 2021-04-07 MED ORDER — OXYCODONE HCL 5 MG PO TABS
5.0000 mg | ORAL_TABLET | ORAL | Status: DC | PRN
  Administered 2021-04-07: 5 mg via ORAL
  Administered 2021-04-08: 10 mg via ORAL
  Filled 2021-04-07: qty 2
  Filled 2021-04-07: qty 1

## 2021-04-07 MED ORDER — ACETAMINOPHEN 500 MG PO TABS
ORAL_TABLET | ORAL | Status: AC
  Filled 2021-04-07: qty 2

## 2021-04-07 MED ORDER — LIDOCAINE HCL (CARDIAC) PF 100 MG/5ML IV SOSY
PREFILLED_SYRINGE | INTRAVENOUS | Status: DC | PRN
  Administered 2021-04-07: 60 mg via INTRAVENOUS

## 2021-04-07 MED ORDER — HEPARIN SOD (PORK) LOCK FLUSH 100 UNIT/ML IV SOLN
INTRAVENOUS | Status: AC
  Filled 2021-04-07: qty 5

## 2021-04-07 MED ORDER — FENTANYL CITRATE (PF) 100 MCG/2ML IJ SOLN
INTRAMUSCULAR | Status: AC
  Filled 2021-04-07: qty 2

## 2021-04-07 MED ORDER — PHENYLEPHRINE HCL (PRESSORS) 10 MG/ML IV SOLN
INTRAVENOUS | Status: DC | PRN
  Administered 2021-04-07 (×2): 80 ug via INTRAVENOUS

## 2021-04-07 MED ORDER — ONDANSETRON 4 MG PO TBDP: 4.0000 mg | ORAL_TABLET | Freq: Four times a day (QID) | ORAL | Status: DC | PRN

## 2021-04-07 MED ORDER — 0.9 % SODIUM CHLORIDE (POUR BTL) OPTIME
TOPICAL | Status: DC | PRN
  Administered 2021-04-07: 1000 mL

## 2021-04-07 MED ORDER — BUPIVACAINE LIPOSOME 1.3 % IJ SUSP
INTRAMUSCULAR | Status: DC | PRN
  Administered 2021-04-07: 10 mL via PERINEURAL

## 2021-04-07 MED ORDER — CEFAZOLIN SODIUM-DEXTROSE 2-4 GM/100ML-% IV SOLN
INTRAVENOUS | Status: AC
  Filled 2021-04-07: qty 100

## 2021-04-07 MED ORDER — BUPIVACAINE HCL (PF) 0.25 % IJ SOLN
INTRAMUSCULAR | Status: AC
  Filled 2021-04-07: qty 150

## 2021-04-07 MED ORDER — HEPARIN (PORCINE) IN NACL 2-0.9 UNITS/ML
INTRAMUSCULAR | Status: AC | PRN
  Administered 2021-04-07: 500 mL via INTRAVENOUS

## 2021-04-07 MED ORDER — LIDOCAINE 2% (20 MG/ML) 5 ML SYRINGE
INTRAMUSCULAR | Status: DC | PRN
  Administered 2021-04-07: 50 mg via INTRAVENOUS

## 2021-04-07 MED ORDER — ACETAMINOPHEN 500 MG PO TABS
1000.0000 mg | ORAL_TABLET | ORAL | Status: AC
  Administered 2021-04-07: 1000 mg via ORAL

## 2021-04-07 MED ORDER — PROPOFOL 500 MG/50ML IV EMUL
INTRAVENOUS | Status: AC
  Filled 2021-04-07: qty 50

## 2021-04-07 MED ORDER — CEFAZOLIN SODIUM-DEXTROSE 2-3 GM-%(50ML) IV SOLR
INTRAVENOUS | Status: DC | PRN
  Administered 2021-04-07: 2 g via INTRAVENOUS

## 2021-04-07 MED ORDER — BUPIVACAINE HCL (PF) 0.25 % IJ SOLN
INTRAMUSCULAR | Status: DC | PRN
  Administered 2021-04-07: 6 mL

## 2021-04-07 MED ORDER — PROPOFOL 10 MG/ML IV BOLUS
INTRAVENOUS | Status: AC
  Filled 2021-04-07: qty 20

## 2021-04-07 MED ORDER — PROPOFOL 10 MG/ML IV BOLUS
INTRAVENOUS | Status: DC | PRN
  Administered 2021-04-07: 100 mg via INTRAVENOUS

## 2021-04-07 MED ORDER — ONDANSETRON HCL 4 MG/2ML IJ SOLN
4.0000 mg | Freq: Once | INTRAMUSCULAR | Status: AC | PRN
  Administered 2021-04-07: 4 mg via INTRAVENOUS
  Filled 2021-04-07: qty 2

## 2021-04-07 MED ORDER — LEVOTHYROXINE SODIUM 50 MCG PO TABS
50.0000 ug | ORAL_TABLET | Freq: Every day | ORAL | Status: DC
  Filled 2021-04-07: qty 1

## 2021-04-07 MED ORDER — LABETALOL HCL 5 MG/ML IV SOLN
INTRAVENOUS | Status: AC
  Filled 2021-04-07: qty 4

## 2021-04-07 MED ORDER — DEXAMETHASONE SODIUM PHOSPHATE 10 MG/ML IJ SOLN
INTRAMUSCULAR | Status: AC
  Filled 2021-04-07: qty 1

## 2021-04-07 MED ORDER — METHOCARBAMOL 500 MG PO TABS
500.0000 mg | ORAL_TABLET | Freq: Four times a day (QID) | ORAL | Status: DC | PRN
  Administered 2021-04-08: 500 mg via ORAL
  Filled 2021-04-07: qty 1

## 2021-04-07 MED ORDER — LACTATED RINGERS IV SOLN: INTRAVENOUS | Status: DC

## 2021-04-07 MED ORDER — CEFAZOLIN SODIUM-DEXTROSE 2-4 GM/100ML-% IV SOLN
2.0000 g | INTRAVENOUS | Status: AC
  Administered 2021-04-07: 2 g via INTRAVENOUS

## 2021-04-07 MED ORDER — EPHEDRINE SULFATE 50 MG/ML IJ SOLN
INTRAMUSCULAR | Status: DC | PRN
  Administered 2021-04-07 (×2): 10 mg via INTRAVENOUS

## 2021-04-07 MED ORDER — FENTANYL CITRATE (PF) 100 MCG/2ML IJ SOLN
25.0000 ug | INTRAMUSCULAR | Status: DC | PRN
  Administered 2021-04-07 (×2): 25 ug via INTRAVENOUS

## 2021-04-07 MED ORDER — ONDANSETRON HCL 4 MG/2ML IJ SOLN
4.0000 mg | Freq: Four times a day (QID) | INTRAMUSCULAR | Status: DC | PRN
  Administered 2021-04-07: 4 mg via INTRAVENOUS

## 2021-04-07 MED ORDER — ONDANSETRON HCL 4 MG/2ML IJ SOLN: 4.0000 mg | Freq: Once | INTRAMUSCULAR | Status: DC | PRN

## 2021-04-07 MED ORDER — SODIUM CHLORIDE 0.9 % IV SOLN: INTRAVENOUS | Status: DC

## 2021-04-07 MED ORDER — MIDAZOLAM HCL 2 MG/2ML IJ SOLN: 2.0000 mg | Freq: Once | INTRAMUSCULAR | Status: DC

## 2021-04-07 MED ORDER — METHYLENE BLUE 0.5 % INJ SOLN
INTRAVENOUS | Status: AC
  Filled 2021-04-07: qty 10

## 2021-04-07 MED ORDER — PROPOFOL 500 MG/50ML IV EMUL
INTRAVENOUS | Status: DC | PRN
  Administered 2021-04-07: 25 ug/kg/min via INTRAVENOUS

## 2021-04-07 MED ORDER — ONDANSETRON HCL 4 MG/2ML IJ SOLN
INTRAMUSCULAR | Status: AC
  Filled 2021-04-07: qty 2

## 2021-04-07 MED ORDER — HYDRALAZINE HCL 20 MG/ML IJ SOLN
INTRAMUSCULAR | Status: AC
  Filled 2021-04-07: qty 1

## 2021-04-07 MED ORDER — DULOXETINE HCL 60 MG PO CPEP: 60.0000 mg | ORAL_CAPSULE | Freq: Two times a day (BID) | ORAL | Status: DC

## 2021-04-07 MED ORDER — PROPOFOL 10 MG/ML IV BOLUS
INTRAVENOUS | Status: DC | PRN
  Administered 2021-04-07: 150 mg via INTRAVENOUS

## 2021-04-07 MED ORDER — HYDRALAZINE HCL 20 MG/ML IJ SOLN
10.0000 mg | Freq: Once | INTRAMUSCULAR | Status: AC
  Administered 2021-04-07: 10 mg via INTRAVENOUS

## 2021-04-07 MED ORDER — GABAPENTIN 300 MG PO CAPS
300.0000 mg | ORAL_CAPSULE | Freq: Three times a day (TID) | ORAL | Status: DC
  Administered 2021-04-08: 300 mg via ORAL
  Filled 2021-04-07: qty 1

## 2021-04-07 MED ORDER — SODIUM CHLORIDE (PF) 0.9 % IJ SOLN
INTRAMUSCULAR | Status: AC
  Filled 2021-04-07: qty 10

## 2021-04-07 MED ORDER — HEPARIN SOD (PORK) LOCK FLUSH 100 UNIT/ML IV SOLN
INTRAVENOUS | Status: DC | PRN
  Administered 2021-04-07: 450 [IU] via INTRAVENOUS

## 2021-04-07 MED ORDER — LIDOCAINE HCL (PF) 2 % IJ SOLN
INTRAMUSCULAR | Status: AC
  Filled 2021-04-07: qty 5

## 2021-04-07 MED ORDER — DEXAMETHASONE SODIUM PHOSPHATE 4 MG/ML IJ SOLN
INTRAMUSCULAR | Status: DC | PRN
  Administered 2021-04-07: 5 mg via INTRAVENOUS

## 2021-04-07 MED ORDER — FENTANYL CITRATE (PF) 100 MCG/2ML IJ SOLN
INTRAMUSCULAR | Status: DC | PRN
  Administered 2021-04-07 (×3): 25 ug via INTRAVENOUS

## 2021-04-07 MED ORDER — ACETAMINOPHEN 500 MG PO TABS
1000.0000 mg | ORAL_TABLET | Freq: Four times a day (QID) | ORAL | Status: DC
  Administered 2021-04-07 – 2021-04-08 (×3): 1000 mg via ORAL
  Filled 2021-04-07 (×3): qty 2

## 2021-04-07 MED ORDER — PANTOPRAZOLE SODIUM 40 MG PO TBEC: 40.0000 mg | DELAYED_RELEASE_TABLET | Freq: Every day | ORAL | Status: DC

## 2021-04-07 MED ORDER — FENTANYL CITRATE (PF) 100 MCG/2ML IJ SOLN: 25.0000 ug | INTRAMUSCULAR | Status: DC | PRN

## 2021-04-07 MED ORDER — MIDAZOLAM HCL 2 MG/2ML IJ SOLN
INTRAMUSCULAR | Status: AC
  Filled 2021-04-07: qty 2

## 2021-04-07 MED ORDER — BUPIVACAINE-EPINEPHRINE (PF) 0.5% -1:200000 IJ SOLN
INTRAMUSCULAR | Status: DC | PRN
  Administered 2021-04-07: 20 mL via PERINEURAL

## 2021-04-07 MED ORDER — ONDANSETRON HCL 4 MG/2ML IJ SOLN
INTRAMUSCULAR | Status: DC | PRN
  Administered 2021-04-07: 4 mg via INTRAVENOUS

## 2021-04-07 MED ORDER — FENTANYL CITRATE (PF) 100 MCG/2ML IJ SOLN
100.0000 ug | Freq: Once | INTRAMUSCULAR | Status: AC
  Administered 2021-04-07: 100 ug via INTRAVENOUS

## 2021-04-07 SURGICAL SUPPLY — 63 items
ADH SKN CLS APL DERMABOND .7 (GAUZE/BANDAGES/DRESSINGS) ×3
APL PRP STRL LF DISP 70% ISPRP (MISCELLANEOUS)
APPLIER CLIP 11 MED OPEN (CLIP) ×2
APPLIER CLIP 9.375 MED OPEN (MISCELLANEOUS)
APR CLP MED 11 20 MLT OPN (CLIP) ×1
APR CLP MED 9.3 20 MLT OPN (MISCELLANEOUS)
BINDER BREAST LRG (GAUZE/BANDAGES/DRESSINGS) ×1 IMPLANT
BINDER BREAST MEDIUM (GAUZE/BANDAGES/DRESSINGS) IMPLANT
BINDER BREAST XLRG (GAUZE/BANDAGES/DRESSINGS) IMPLANT
BINDER BREAST XXLRG (GAUZE/BANDAGES/DRESSINGS) IMPLANT
BIOPATCH RED 1 DISK 7.0 (GAUZE/BANDAGES/DRESSINGS) ×1 IMPLANT
BLADE SURG 15 STRL LF DISP TIS (BLADE) ×1 IMPLANT
BLADE SURG 15 STRL SS (BLADE) ×2
CANISTER SUCT 1200ML W/VALVE (MISCELLANEOUS) ×1 IMPLANT
CHLORAPREP W/TINT 26 (MISCELLANEOUS) ×1 IMPLANT
CLIP APPLIE 11 MED OPEN (CLIP) IMPLANT
CLIP APPLIE 9.375 MED OPEN (MISCELLANEOUS) IMPLANT
CLIP VESOCCLUDE SM WIDE 6/CT (CLIP) IMPLANT
COVER BACK TABLE 60X90IN (DRAPES) ×2 IMPLANT
COVER MAYO STAND STRL (DRAPES) ×2 IMPLANT
COVER WAND RF STERILE (DRAPES) IMPLANT
DECANTER SPIKE VIAL GLASS SM (MISCELLANEOUS) IMPLANT
DERMABOND ADVANCED (GAUZE/BANDAGES/DRESSINGS) ×3
DERMABOND ADVANCED .7 DNX12 (GAUZE/BANDAGES/DRESSINGS) ×1 IMPLANT
DRAIN CHANNEL 19F RND (DRAIN) ×1 IMPLANT
DRAPE LAPAROSCOPIC ABDOMINAL (DRAPES) ×2 IMPLANT
DRAPE UTILITY XL STRL (DRAPES) ×1 IMPLANT
DRSG TEGADERM 4X4.75 (GAUZE/BANDAGES/DRESSINGS) ×1 IMPLANT
ELECT COATED BLADE 2.86 ST (ELECTRODE) ×1 IMPLANT
ELECT REM PT RETURN 9FT ADLT (ELECTROSURGICAL) ×2
ELECTRODE REM PT RTRN 9FT ADLT (ELECTROSURGICAL) ×1 IMPLANT
EVACUATOR SILICONE 100CC (DRAIN) ×1 IMPLANT
GAUZE SPONGE 4X4 12PLY STRL LF (GAUZE/BANDAGES/DRESSINGS) ×1 IMPLANT
GLOVE SURG ENC MOIS LTX SZ7 (GLOVE) ×2 IMPLANT
GLOVE SURG UNDER POLY LF SZ7.5 (GLOVE) ×2 IMPLANT
GOWN STRL REUS W/ TWL LRG LVL3 (GOWN DISPOSABLE) ×3 IMPLANT
GOWN STRL REUS W/TWL LRG LVL3 (GOWN DISPOSABLE) ×4
HEMOSTAT ARISTA ABSORB 3G PWDR (HEMOSTASIS) ×2 IMPLANT
KIT MARKER MARGIN INK (KITS) ×1 IMPLANT
NDL HYPO 25X1 1.5 SAFETY (NEEDLE) ×1 IMPLANT
NEEDLE HYPO 25X1 1.5 SAFETY (NEEDLE) IMPLANT
NS IRRIG 1000ML POUR BTL (IV SOLUTION) ×1 IMPLANT
PACK BASIN DAY SURGERY FS (CUSTOM PROCEDURE TRAY) ×2 IMPLANT
PENCIL SMOKE EVACUATOR (MISCELLANEOUS) ×2 IMPLANT
RETRACTOR ONETRAX LX 90X20 (MISCELLANEOUS) ×1 IMPLANT
SLEEVE SCD COMPRESS KNEE MED (STOCKING) ×2 IMPLANT
SPONGE LAP 18X18 RF (DISPOSABLE) ×2 IMPLANT
SPONGE LAP 4X18 RFD (DISPOSABLE) ×1 IMPLANT
STRIP CLOSURE SKIN 1/2X4 (GAUZE/BANDAGES/DRESSINGS) ×3 IMPLANT
SUT ETHILON 2 0 FS 18 (SUTURE) ×2 IMPLANT
SUT MNCRL AB 4-0 PS2 18 (SUTURE) ×2 IMPLANT
SUT MON AB 5-0 PS2 18 (SUTURE) IMPLANT
SUT SILK 2 0 SH (SUTURE) IMPLANT
SUT VIC AB 2-0 SH 27 (SUTURE) ×4
SUT VIC AB 2-0 SH 27XBRD (SUTURE) ×1 IMPLANT
SUT VIC AB 3-0 SH 27 (SUTURE)
SUT VIC AB 3-0 SH 27X BRD (SUTURE) ×1 IMPLANT
SUT VICRYL 3-0 CR8 SH (SUTURE) ×2 IMPLANT
SYR CONTROL 10ML LL (SYRINGE) ×1 IMPLANT
TOWEL GREEN STERILE FF (TOWEL DISPOSABLE) ×3 IMPLANT
TRAY FAXITRON CT DISP (TRAY / TRAY PROCEDURE) IMPLANT
TUBE CONNECTING 20X1/4 (TUBING) ×1 IMPLANT
YANKAUER SUCT BULB TIP NO VENT (SUCTIONS) ×1 IMPLANT

## 2021-04-07 SURGICAL SUPPLY — 84 items
ADH SKN CLS APL DERMABOND .7 (GAUZE/BANDAGES/DRESSINGS) ×4
APL PRP STRL LF DISP 70% ISPRP (MISCELLANEOUS) ×2
APL SKNCLS STERI-STRIP NONHPOA (GAUZE/BANDAGES/DRESSINGS)
APPLIER CLIP 11 MED OPEN (CLIP)
APPLIER CLIP 9.375 MED OPEN (MISCELLANEOUS)
APR CLP MED 11 20 MLT OPN (CLIP)
APR CLP MED 9.3 20 MLT OPN (MISCELLANEOUS)
BAG DECANTER FOR FLEXI CONT (MISCELLANEOUS) ×3 IMPLANT
BENZOIN TINCTURE PRP APPL 2/3 (GAUZE/BANDAGES/DRESSINGS) IMPLANT
BINDER BREAST LRG (GAUZE/BANDAGES/DRESSINGS) ×1 IMPLANT
BLADE CLIPPER SURG (BLADE) IMPLANT
BLADE HEX COATED 2.75 (ELECTRODE) IMPLANT
BLADE SURG 10 STRL SS (BLADE) ×3 IMPLANT
BLADE SURG 11 STRL SS (BLADE) ×3 IMPLANT
BLADE SURG 15 STRL LF DISP TIS (BLADE) ×4 IMPLANT
BLADE SURG 15 STRL SS (BLADE) ×6
CANISTER SUCT 1200ML W/VALVE (MISCELLANEOUS) ×3 IMPLANT
CHLORAPREP W/TINT 26 (MISCELLANEOUS) ×4 IMPLANT
CLIP APPLIE 11 MED OPEN (CLIP) IMPLANT
CLIP APPLIE 9.375 MED OPEN (MISCELLANEOUS) IMPLANT
CLIP VESOCCLUDE SM WIDE 6/CT (CLIP) IMPLANT
COVER BACK TABLE 60X90IN (DRAPES) ×5 IMPLANT
COVER MAYO STAND STRL (DRAPES) ×4 IMPLANT
COVER PROBE 5X48 (MISCELLANEOUS) ×3
COVER PROBE W GEL 5X96 (DRAPES) ×3 IMPLANT
COVER WAND RF STERILE (DRAPES) IMPLANT
DECANTER SPIKE VIAL GLASS SM (MISCELLANEOUS) ×1 IMPLANT
DERMABOND ADVANCED (GAUZE/BANDAGES/DRESSINGS) ×2
DERMABOND ADVANCED .7 DNX12 (GAUZE/BANDAGES/DRESSINGS) ×4 IMPLANT
DEVICE DISSECT PLASMABLAD 3.0S (MISCELLANEOUS) ×2 IMPLANT
DRAIN CHANNEL 19F RND (DRAIN) ×3 IMPLANT
DRAPE C-ARM 42X72 X-RAY (DRAPES) ×3 IMPLANT
DRAPE LAPAROSCOPIC ABDOMINAL (DRAPES) ×3 IMPLANT
DRAPE TOP ARMCOVERS (MISCELLANEOUS) ×1 IMPLANT
DRAPE U-SHAPE 76X120 STRL (DRAPES) ×1 IMPLANT
DRAPE UTILITY XL STRL (DRAPES) ×3 IMPLANT
DRSG PAD ABDOMINAL 8X10 ST (GAUZE/BANDAGES/DRESSINGS) ×3 IMPLANT
DRSG TEGADERM 4X4.75 (GAUZE/BANDAGES/DRESSINGS) IMPLANT
ELECT BLADE 4.0 EZ CLEAN MEGAD (MISCELLANEOUS)
ELECT COATED BLADE 2.86 ST (ELECTRODE) ×2 IMPLANT
ELECT REM PT RETURN 9FT ADLT (ELECTROSURGICAL) ×3
ELECTRODE BLDE 4.0 EZ CLN MEGD (MISCELLANEOUS) IMPLANT
ELECTRODE REM PT RTRN 9FT ADLT (ELECTROSURGICAL) ×4 IMPLANT
EVACUATOR SILICONE 100CC (DRAIN) ×3 IMPLANT
GAUZE SPONGE 4X4 12PLY STRL LF (GAUZE/BANDAGES/DRESSINGS) ×2 IMPLANT
GLOVE SURG ENC MOIS LTX SZ7 (GLOVE) ×6 IMPLANT
GLOVE SURG UNDER POLY LF SZ7.5 (GLOVE) ×6 IMPLANT
GOWN STRL REUS W/ TWL LRG LVL3 (GOWN DISPOSABLE) ×10 IMPLANT
GOWN STRL REUS W/TWL LRG LVL3 (GOWN DISPOSABLE) ×9
HEMOSTAT ARISTA ABSORB 3G PWDR (HEMOSTASIS) ×4 IMPLANT
IV KIT MINILOC 20X1 SAFETY (NEEDLE) IMPLANT
KIT CVR 48X5XPRB PLUP LF (MISCELLANEOUS) ×1 IMPLANT
KIT PORT POWER 8FR ISP CVUE (Port) ×2 IMPLANT
NDL HYPO 25X1 1.5 SAFETY (NEEDLE) ×6 IMPLANT
NDL SAFETY ECLIPSE 18X1.5 (NEEDLE) ×1 IMPLANT
NEEDLE HYPO 18GX1.5 SHARP (NEEDLE)
NEEDLE HYPO 25X1 1.5 SAFETY (NEEDLE) ×3 IMPLANT
NS IRRIG 1000ML POUR BTL (IV SOLUTION) ×3 IMPLANT
PACK BASIN DAY SURGERY FS (CUSTOM PROCEDURE TRAY) ×5 IMPLANT
PENCIL SMOKE EVACUATOR (MISCELLANEOUS) ×4 IMPLANT
PIN SAFETY STERILE (MISCELLANEOUS) ×3 IMPLANT
PLASMABLADE 3.0S (MISCELLANEOUS)
RETRACTOR ONETRAX LX 90X20 (MISCELLANEOUS) IMPLANT
SLEEVE SCD COMPRESS KNEE MED (STOCKING) ×5 IMPLANT
SPONGE LAP 18X18 RF (DISPOSABLE) ×4 IMPLANT
SPONGE LAP 4X18 RFD (DISPOSABLE) IMPLANT
STRIP CLOSURE SKIN 1/2X4 (GAUZE/BANDAGES/DRESSINGS) ×4 IMPLANT
SUT ETHILON 2 0 FS 18 (SUTURE) ×3 IMPLANT
SUT MNCRL AB 4-0 PS2 18 (SUTURE) ×4 IMPLANT
SUT MON AB 5-0 PS2 18 (SUTURE) IMPLANT
SUT PROLENE 2 0 SH DA (SUTURE) ×3 IMPLANT
SUT SILK 2 0 SH (SUTURE) IMPLANT
SUT SILK 2 0 TIES 17X18 (SUTURE)
SUT SILK 2-0 18XBRD TIE BLK (SUTURE) IMPLANT
SUT VIC AB 3-0 54X BRD REEL (SUTURE) IMPLANT
SUT VIC AB 3-0 BRD 54 (SUTURE)
SUT VIC AB 3-0 SH 27 (SUTURE) ×3
SUT VIC AB 3-0 SH 27X BRD (SUTURE) ×2 IMPLANT
SUT VICRYL 3-0 CR8 SH (SUTURE) ×4 IMPLANT
SYR 5ML LUER SLIP (SYRINGE) ×3 IMPLANT
SYR CONTROL 10ML LL (SYRINGE) ×7 IMPLANT
TOWEL GREEN STERILE FF (TOWEL DISPOSABLE) ×8 IMPLANT
TUBE CONNECTING 20X1/4 (TUBING) ×3 IMPLANT
YANKAUER SUCT BULB TIP NO VENT (SUCTIONS) ×3 IMPLANT

## 2021-04-07 NOTE — Op Note (Signed)
Preoperative diagnosis: Left postmastectomy hematoma Postoperative diagnosis: Same as above Procedure: Evacuation of left postmastectomy hematoma Surgeon: Dr. Serita Grammes Anesthesia: General Estimated blood loss: 250 cc that was prior to surgery Specimens: None Drains: 19 [place] Blake drain Complications: None Sponge and count was correct completion Decision recovery stable condition  Indications: This is 75 year old female underwent a right sided IJ port placement as well as a left mastectomy and sentinel node biopsy earlier today.  About 6 hours postop she acutely developed a left breast hematoma.  Her heart rate and blood pressure remain normal.  Her hemoglobin was 11.  We return to the operating room urgently for the hematoma.  Procedure: After informed consent was obtained and I discussed with her daughter we proceed to the operating room urgently.  She was redosed with antibiotics.  SCDs were in place.  She was placed under general anesthesia without complication.  She was prepped and draped in the standard sterile surgical fashion.  A surgical timeout was then performed.  I reopened her entire incision.  I removed about 250 cc of old clot.  It appeared that the only new blood was coming from her axilla and her sentinel node biopsy location.  There was a small blood vessel on the chest wall that had retracted and was bleeding.  I controlled this with a couple of clips as well as a 3-0 Vicryl suture.  There were multiple other friable areas that I cauterized but this was the area that was leading to the hematoma.  I then irrigated.  I placed Arista throughout the cavity.  I closed the lateral tissue down with 2-0 Vicryl and I closed the axilla with 3-0 Vicryl.  I then placed another 69 [place] Blake drain and secured this with a 2-0 nylon.  I then closed it with 3-0 Vicryl and 4-0 Monocryl.  Glue and Steri-Strips were applied.  She tolerated this well remained hemodynamically stable and will  be transferred to PACU.  I think she will be fine to remain at day surgery overnight and likely just be discharged tomorrow.

## 2021-04-07 NOTE — Anesthesia Procedure Notes (Addendum)
Anesthesia Regional Block: Pectoralis block   Pre-Anesthetic Checklist: ,, timeout performed, Correct Patient, Correct Site, Correct Laterality, Correct Procedure, Correct Position, site marked, Risks and benefits discussed,  Surgical consent,  Pre-op evaluation,  At surgeon's request and post-op pain management  Laterality: Left  Prep: chloraprep       Needles:  Injection technique: Single-shot  Needle Type: Echogenic Stimulator Needle     Needle Length: 10cm  Needle Gauge: 21   Needle insertion depth: 7 cm   Additional Needles:   Procedures:,,,, ultrasound used (permanent image in chart),,,,  Narrative:  Start time: 04/07/2021 8:04 AM End time: 04/07/2021 8:09 AM Injection made incrementally with aspirations every 5 mL.  Performed by: Personally  Anesthesiologist: Josephine Igo, MD  Additional Notes: Timeout performed. Patient sedated. Relevant anatomy ID'd using [place]. Incremental 2-65ml injection of LA with frequent aspiration. Patient tolerated procedure well.        Left Pectoralis Block

## 2021-04-07 NOTE — Progress Notes (Signed)
Assisted Dr. Royce Macadamia with left, ultrasound guided, pectoralis block. Side rails up, monitors on throughout procedure. See vital signs in flow sheet. Tolerated Procedure well.

## 2021-04-07 NOTE — Anesthesia Procedure Notes (Signed)
Procedure Name: LMA Insertion Date/Time: 04/07/2021 3:34 PM Performed by: Maryella Shivers, CRNA Pre-anesthesia Checklist: Patient identified, Emergency Drugs available, Suction available and Patient being monitored Patient Re-evaluated:Patient Re-evaluated prior to induction Oxygen Delivery Method: Circle system utilized Preoxygenation: Pre-oxygenation with 100% oxygen Induction Type: IV induction Ventilation: Mask ventilation without difficulty LMA: LMA inserted LMA Size: 4.0 Number of attempts: 1 Airway Equipment and Method: Bite block Placement Confirmation: positive ETCO2 Tube secured with: Tape Dental Injury: Teeth and Oropharynx as per pre-operative assessment

## 2021-04-07 NOTE — Anesthesia Postprocedure Evaluation (Signed)
Anesthesia Post Note  Patient: Gina Hunt  Procedure(s) Performed: EVACUATION HEMATOMA BREAST (Left Breast)     Patient location during evaluation: PACU Anesthesia Type: General Level of consciousness: awake and alert and oriented Pain management: pain level controlled Vital Signs Assessment: post-procedure vital signs reviewed and stable Respiratory status: spontaneous breathing, nonlabored ventilation and respiratory function stable Cardiovascular status: blood pressure returned to baseline and stable Postop Assessment: no apparent nausea or vomiting Anesthetic complications: no   No complications documented.  Last Vitals:  Vitals:   04/07/21 1736 04/07/21 1812  BP: 121/67 (!) 108/53  Pulse: 67   Resp: 18   Temp: 36.6 C   SpO2: 98% 95%    Last Pain:  Vitals:   04/07/21 1800  TempSrc:   PainSc: 8                  Jerelle Virden A.

## 2021-04-07 NOTE — Anesthesia Preprocedure Evaluation (Signed)
Anesthesia Evaluation  Patient identified by MRN, date of birth, ID band Patient awake    Reviewed: Allergy & Precautions, NPO status , Patient's Chart, lab work & pertinent test results  History of Anesthesia Complications (+) PONV and history of anesthetic complications  Airway Mallampati: II  TM Distance: >3 FB Neck ROM: Full    Dental no notable dental hx. (+) Teeth Intact, Caps, Dental Advisory Given   Pulmonary neg pulmonary ROS,    Pulmonary exam normal breath sounds clear to auscultation       Cardiovascular negative cardio ROS Normal cardiovascular exam Rhythm:Regular Rate:Normal     Neuro/Psych PSYCHIATRIC DISORDERS Anxiety  Neuromuscular disease    GI/Hepatic Neg liver ROS, PUD, GERD  Medicated and Controlled,  Endo/Other  Hypothyroidism Left Breast Ca S/P mastectomy earlier today with deep AND, post op hematoma  Renal/GU negative Renal ROS Bladder dysfunction  SUI    Musculoskeletal  (+) Arthritis , Osteoarthritis,  Fibromyalgia -  Abdominal   Peds  Hematology negative hematology ROS (+)   Anesthesia Other Findings   Reproductive/Obstetrics                             Anesthesia Physical  Anesthesia Plan  ASA: II  Anesthesia Plan: General   Post-op Pain Management:  Regional for Post-op pain   Induction: Intravenous  PONV Risk Score and Plan: 4 or greater and Treatment may vary due to age or medical condition and Ondansetron  Airway Management Planned: LMA  Additional Equipment:   Intra-op Plan:   Post-operative Plan: Extubation in OR  Informed Consent: I have reviewed the patients History and Physical, chart, labs and discussed the procedure including the risks, benefits and alternatives for the proposed anesthesia with the patient or authorized representative who has indicated his/her understanding and acceptance.   Patient has DNR.  Discussed DNR with  patient and Suspend DNR.   Dental advisory given  Plan Discussed with: CRNA and Anesthesiologist  Anesthesia Plan Comments:         Anesthesia Quick Evaluation

## 2021-04-07 NOTE — Anesthesia Procedure Notes (Signed)
Procedure Name: LMA Insertion Date/Time: 04/07/2021 8:50 AM Performed by: Maryella Shivers, CRNA Pre-anesthesia Checklist: Patient identified, Emergency Drugs available, Suction available and Patient being monitored Patient Re-evaluated:Patient Re-evaluated prior to induction Oxygen Delivery Method: Circle system utilized Preoxygenation: Pre-oxygenation with 100% oxygen Induction Type: IV induction Ventilation: Mask ventilation without difficulty LMA: LMA inserted LMA Size: 4.0 Number of attempts: 1 Airway Equipment and Method: Bite block Placement Confirmation: positive ETCO2 Tube secured with: Tape Dental Injury: Teeth and Oropharynx as per pre-operative assessment

## 2021-04-07 NOTE — Progress Notes (Addendum)
Patient ambulated from stretcher to restroom, then to hospital bed without issue. Patient denied feeling lightheaded, dizzy, or nauseous. Vital signs stable.Two RNs assessed patient and noted a small amount of bruising to left breast without any swelling. About 10 minutes later, patient reports severe nausea and dizziness. Blood pressure read 68/50 at this time and increased bruising and swelling noted to left breast. Patient placed in trendelenburg position. Dr. Royce Macadamia and Dr. Donne Hazel made aware and to bedside. Decision made for patient to return to OR. Patient's family at bedside. Patient NPO. Vital signs stable. Patient alert and oriented. Awaiting further intervention from MD.

## 2021-04-07 NOTE — Discharge Instructions (Addendum)
[facility], [place] [phone]  MASTECTOMY: POST OP INSTRUCTIONS Take 400 mg of ibuprofen every 8 hours or 650 mg tylenol every 6 hours for next 72 hours then as needed. Use ice several times daily also. Always review your discharge instruction sheet given to you by the facility where your surgery was performed. IF YOU HAVE DISABILITY OR FAMILY LEAVE FORMS, YOU MUST BRING THEM TO THE OFFICE FOR PROCESSING.   DO NOT GIVE THEM TO YOUR DOCTOR. A prescription for pain medication may be given to you upon discharge.  Take your pain medication as prescribed, if needed.  If narcotic pain medicine is not needed, then you may take acetaminophen (Tylenol), naprosyn (Alleve) or ibuprofen (Advil) as needed. Take your usually prescribed medications unless otherwise directed. If you need a refill on your pain medication, please contact your pharmacy.  They will contact our office to request authorization.  Prescriptions will not be filled after 5pm or on week-ends. You should follow a light diet the first few days after arrival home, such as soup and crackers, etc.  Resume your normal diet the day after surgery. Most patients will experience some swelling and bruising on the chest and underarm.  Ice packs will help.  Swelling and bruising can take several days to resolve. Wear the binder day and night until you return to the office.  It is common to experience some constipation if taking pain medication after surgery.  Increasing fluid intake and taking a stool softener (such as Colace) will usually help or prevent this problem from occurring.  A mild laxative (Milk of Magnesia or Miralax) should be taken according to package instructions if there are no bowel movements after 48 hours. Unless discharge instructions indicate otherwise, leave your bandage dry and in place until your next appointment in 3-5 days.  You may take a limited sponge bath.  No tube baths or showers until the drains are  removed.  You may have steri-strips (small skin tapes) in place directly over the incision.  These strips should be left on the skin for 7-10 days. If you have glue it will come off in next couple week.  Any sutures will be removed at an office visit DRAINS:  If you have drains in place, it is important to keep a list of the amount of drainage produced each day in your drains.  Before leaving the hospital, you should be instructed on drain care.  Call our office if you have any questions about your drains. I will remove your drains when they put out less than 30 cc or ml for 2 consecutive days. ACTIVITIES:  You may resume regular (light) daily activities beginning the next day--such as daily self-care, walking, climbing stairs--gradually increasing activities as tolerated.  You may have sexual intercourse when it is comfortable.  Refrain from any heavy lifting or straining until approved by your doctor. You may drive when you are no longer taking prescription pain medication, you can comfortably wear a seatbelt, and you can safely maneuver your car and apply brakes. RETURN TO WORK:  __________________________________________________________ Dennis Bast should see your doctor in the office for a follow-up appointment approximately 3-5 days after your surgery.  Your doctor's nurse will typically make your follow-up appointment when she calls you with your pathology report.  Expect your pathology report 3-4business days after surgery. OTHER INSTRUCTIONS: ______________________________________________________________________________________________ ____________________________________________________________________________________________ WHEN TO CALL YOUR DR [place]: Fever over 101.0 Nausea and/or vomiting Extreme swelling or bruising Continued bleeding from incision. Increased pain, redness,  or drainage from the incision. The clinic staff is available to answer your questions during regular business hours.   Please don't hesitate to call and ask to speak to one of the nurses for clinical concerns.  If you have a medical emergency, go to the nearest emergency room or call 911.  A surgeon from [facility] is always on call at the hospital. [address] [place], [place], [name]  [address] ? [address], [place], [place]   [address] [phone] ? [phone] ? FAX [phone] Web site: [email or web address]   About my Jackson-Pratt Bulb Drain  What is a Jackson-Pratt bulb? A Jackson-Pratt is a soft, round device used to collect drainage. It is connected to a long, thin drainage catheter, which is held in place by one or two small stiches near your surgical incision site. When the bulb is squeezed, it forms a vacuum, forcing the drainage to empty into the bulb.  Emptying the Jackson-Pratt bulb- To empty the bulb: 1. Release the plug on the top of the bulb. 2. Pour the bulb's contents into a measuring container which your nurse will provide. 3. Record the time emptied and amount of drainage. Empty the drain(s) as often as your     doctor or nurse recommends.  Date                  Time                    Amount (Drain 1)                 Amount (Drain 2)  _____________________________________________________________________  _____________________________________________________________________  _____________________________________________________________________  _____________________________________________________________________  _____________________________________________________________________  _____________________________________________________________________  _____________________________________________________________________  _____________________________________________________________________  Squeezing the Jackson-Pratt Bulb- To squeeze the bulb: 1. Make sure the plug at the top of the bulb is open. 2. Squeeze the bulb tightly in  your fist. You will hear air squeezing from the bulb. 3. Replace the plug while the bulb is squeezed. 4. Use a safety pin to attach the bulb to your clothing. This will keep the catheter from     pulling at the bulb insertion site.  When to call your doctor- Call your doctor if: Drain site becomes red, swollen or hot. You have a fever greater than 101 degrees F. There is oozing at the drain site. Drain falls out (apply a guaze bandage over the drain hole and secure it with tape). Drainage increases daily not related to activity patterns. (You will usually have more drainage when you are active than when you are resting.) Drainage has a bad odor.     Information for Discharge Teaching: EXPAREL (bupivacaine liposome injectable suspension)   Your surgeon or anesthesiologist gave you EXPAREL(bupivacaine) to help control your pain after surgery.  EXPAREL is a local anesthetic that provides pain relief by numbing the tissue around the surgical site. EXPAREL is designed to release pain medication over time and can control pain for up to 72 hours. Depending on how you respond to EXPAREL, you may require less pain medication during your recovery.  Possible side effects: Temporary loss of sensation or ability to move in the area where bupivacaine was injected. Nausea, vomiting, constipation Rarely, numbness and tingling in your mouth or lips, lightheadedness, or anxiety may occur. Call your doctor right away if you think you may be experiencing any of these sensations, or if you have other questions regarding possible side effects.  Follow all other discharge instructions given  to you by your surgeon or nurse. Eat a healthy diet and drink plenty of water or other fluids.  If you return to the hospital for any reason within 96 hours following the administration of EXPAREL, it is important for health care providers to know that you have received this anesthetic. A teal colored band has been  placed on your arm with the date, time and amount of EXPAREL you have received in order to alert and inform your health care providers. Please leave this armband in place for the full 96 hours following administration, and then you may remove the band.

## 2021-04-07 NOTE — Transfer of Care (Signed)
Immediate Anesthesia Transfer of Care Note  Patient: Gina Hunt  Procedure(s) Performed: EVACUATION HEMATOMA BREAST (Left Breast)  Patient Location: PACU  Anesthesia Type:General  Level of Consciousness: sedated  Airway & Oxygen Therapy: Patient Spontanous Breathing and Patient connected to face mask oxygen  Post-op Assessment: Report given to RN and Post -op Vital signs reviewed and stable  Post vital signs: Reviewed and stable  Last Vitals:  Vitals Value Taken Time  BP 118/64 04/07/21 1651  Temp    Pulse 65 04/07/21 1655  Resp 14 04/07/21 1655  SpO2 98 % 04/07/21 1655  Vitals shown include unvalidated device data.  Last Pain:  Vitals:   04/07/21 1345  TempSrc:   PainSc: 0-No pain      Patients Stated Pain Goal: 5 (44/92/01 0071)  Complications: No complications documented.

## 2021-04-07 NOTE — H&P (Addendum)
Gina Hunt is an 75 y.o. female.   Chief Complaint: breast cancer HPI:  40 yof who presented after screening m that shows a mass with calcs in the luoq. she has fh with 3 maternal aunts. there is 2.5 irregular mass and on [place] this is 2.5x1.8x1.8 cm. ax [place] is negative. biopsy was done and shows a grade III IDC that is 95% er pos, 80%pr pos, her 2 negative and Ki is 30%. her husband was in hospice when we met and she desired to delay surgery we started her on antiestrogen which she is tolerating. her husband passed away in 01-09-2023. she is having a tough time with this. we then repeated her mm and this shows the mass now is 2.3x2.8x2.4 cm with an additional 9 cm of calcs . oncotype was also 32.   Past Medical History:  Diagnosis Date  . Anxiety   . Breast cancer ([place]) 01/2021   left breast IDC  . Complication of anesthesia   . Family history of breast cancer 12/16/2020  . Family history of gastric cancer 12/16/2020  . Fibromyalgia   . GERD (gastroesophageal reflux disease)   . Hyperlipidemia   . Hypothyroidism   . Osteoarthritis    knees, hands, fingers  . Osteoarthritis   . PONV (postoperative nausea and vomiting)   . Skin cancer     Past Surgical History:  Procedure Laterality Date  . ABDOMINAL HYSTERECTOMY    . COLONOSCOPY  11/24/2015  . LIVER SURGERY    . TONSILLECTOMY      Family History  Problem Relation Age of Onset  . Hypertension Mother   . Diabetes Mother   . Arthritis Mother   . Heart disease Mother   . Rheum arthritis Father   . Diabetes Father   . Heart disease Father   . Stomach cancer Brother 43  . Stomach cancer Brother 89  . Esophageal cancer Brother 45  . Breast cancer Maternal Aunt 01-09-69  . Breast cancer Maternal Aunt 2071/01/09  . Breast cancer Maternal Aunt 73  . Colon cancer Neg Hx   . Colon polyps Neg Hx    Social History:  reports that she has never smoked. She has never used smokeless tobacco. She reports current alcohol use. She reports that she  does not use drugs.  Allergies:  Allergies  Allergen Reactions  . Codeine Other (See Comments)    Hallucination   . Morphine And Related Other (See Comments)    Knocks me out per pt    Medications Prior to Admission  Medication Sig Dispense Refill  . anastrozole (ARIMIDEX) 1 MG tablet Take 1 tablet (1 mg total) by mouth daily. 90 tablet 4  . Calcium Carbonate (CALCIUM 600 PO) Take 1 tablet by mouth 2 (two) times daily.    . DULoxetine (CYMBALTA) 60 MG capsule Take 1 capsule by mouth twice daily 60 capsule 0  . gabapentin (NEURONTIN) 300 MG capsule Take 1 capsule (300 mg total) by mouth 3 (three) times daily. 270 capsule 1  . levothyroxine (SYNTHROID) 50 MCG tablet Take 1 tablet (50 mcg total) by mouth daily before breakfast. 90 tablet 2  . omeprazole (PRILOSEC) 40 MG capsule TAKE 1 CAPSULE DAILY 90 capsule 1  . simvastatin (ZOCOR) 40 MG tablet Take 1 tablet by mouth once daily 30 tablet 0    No results found for this or any previous visit (from the past 48 hour(s)). No results found.  Review of Systems  All other systems reviewed and  are negative.   Blood pressure (!) 151/93, pulse 68, temperature 98.6 F (37 C), temperature source Oral, resp. rate 18, height 5' 1"  (1.549 m), weight 61 kg, SpO2 98 %. Physical Exam  General nad cv rrr Lungs clear Breast Nipples-No Discharge. Breast Lump-No Palpable Breast Mass. Lymphatic Head & Neck General Head & Neck Lymphatics: Bilateral - Description - Normal. Axillary General Axillary Region: Bilateral - Description - Normal.  Assessment/Plan BREAST CANCER OF UPPER-OUTER QUADRANT OF LEFT FEMALE BREAST (C50.412) Story: Left mastectomy, left ax sn biopsy, port placement with new changes I think mastectomy indicated. she has no desire for reconstruction, would like to avoid radiotherapy and does not want a chance at second surgery for margins. Discussed chemotherapy with oncology and will plan for port placement today as well We  discussed a sentinel lymph node biopsy as she does not appear to having lymph node involvement right now. We discussed the performance of that with injection of radioactive tracer. We discussed that there is a chance of having a positive node with a sentinel lymph node biopsy and we will await the permanent pathology to make any other first further decisions in terms of her treatment. We discussed up to a 5% risk lifetime of chronic shoulder pain as well as lymphedema associated with a sentinel lymph node biopsy.  Rolm Bookbinder, MD 04/07/2021, 8:11 AM

## 2021-04-07 NOTE — Op Note (Signed)
Preoperative diagnosis: T2 left breast cancer Postoperative diagnosis: Same as above Procedure: 1.  Right internal jugular vein port placement with ultrasound guidance 2.  Left mastectomy 3.  Left deep axillary sentinel lymph node biopsy Surgeon: Dr. Serita Grammes Anesthesia: General with a pectoral block Estimated blood loss: 50 cc Specimens: 1.  Left breast tissue marked short superior, long lateral 2.  Left axillary sentinel lymph nodes with highest count of 578 Complications: None Drains: 19 [place] Blake drain to left side Sponge needle count was correct at completion Disposition recovery stable condition  Indications:75 yof who presented after screening m that shows a mass with calcs in the luoq.  there is 2.5 irregular mass and on [place] this is 2.5x1.8x1.8 cm. ax [place] is negative. biopsy was done and shows a grade III IDC that is 95% er pos, 80%pr pos, her 2 negative and Ki is 30%. her husband was in hospice when we met and she desired to delay surgery we started her on antiestrogen which she is tolerating. her husband passed away in 01/10/2023.we then repeated her mm and this shows the mass now is 2.3x2.8x2.4 cm with an additional 9 cm of calcs . oncotype was also 32. We discussed mastectomy and a sentinel lymph node biopsy.  She has been seen by oncology and is agreed to do chemotherapy so we will also place a port at the same time.  Procedure: After informed consent was obtained the patient was taken to the operating.  She had received a pectoral block and was injected with Lymphoseek in the standard periareolar fashion.  She had SCDs in place.  She was given Ancef.  She was placed under general anesthesia without complication.  She was prepped and draped in the standard sterile surgical fashion.  A surgical timeout was then performed.  I first placed the port.  I identified her internal jugular vein with the ultrasound.  I made a small nick in the skin.  I accessed the vein on the first  pass with the needle.  I then placed the wire.  The wire was confirmed to be in the vein by ultrasound.  Fluoroscopy showed the wire going into the correct position.  I then infiltrated Marcaine below the clavicle.  I made incision made a pocket.  I tunneled the line between the 2 sites.  I then placed the dilator over the wire and while under fluoroscopy push the dilator into position.  The wire assembly was then removed.  The line was then placed in the sheath.  The sheath was peeled away.  The line was pulled back to be near the cavoatrial junction.  She had a significant amount of PVCs with the line any deeper.  I then attached the line to the port.  This was then sutured into position at the chest wall with 2-0 Prolene suture.  The line aspirated blood and flushed easily.  I packed this with heparin.  I then did 1 and 1 additional final film.  I closed this with 3-0 Vicryl and 4 Monocryl.  Glue was placed.  I then did the mastectomy.  I made a batwing shaped incision.  This included the nipple and the areola.  I created flaps to the clavicle, parasternal region, inframammary fold, and latissimus laterally.  I then remove the breast tissue and the pectoralis fashion from the muscle.  This was marked as above and passed off the table.  Hemostasis was obtained.  I then entered into the axilla.  I the  used the neoprobe to identify what appeared to be a couple of sentinel nodes.  The highest count is listed as above.  These were passed off the table as well.  I obtained hemostasis.  I closed the axilla with 3-0 Vicryl.  I then placed a 76 [place] Blake drain and secured this with 2-0 nylon suture.  I closed the lateral tissue down with a running 2-0 Vicryl suture.  I then closed the incision with 3-0 Vicryl suture.  The skin was closed with 4-0 Monocryl.  Glue was placed.  She tolerated this well was extubated transferred recovery stable condition.

## 2021-04-07 NOTE — Anesthesia Postprocedure Evaluation (Signed)
Anesthesia Post Note  Patient: Gina Hunt  Procedure(s) Performed: LEFT SIMPLE MASTECTOMY WITH LEFT AXILLARY SENTINEL NODE BIOPSY (Left Breast) PORTA CATH INSERTION (Right Chest)     Patient location during evaluation: PACU Anesthesia Type: General Level of consciousness: awake and alert and oriented Pain management: pain level controlled Vital Signs Assessment: post-procedure vital signs reviewed and stable Respiratory status: spontaneous breathing, nonlabored ventilation and respiratory function stable Cardiovascular status: blood pressure returned to baseline and stable Postop Assessment: no apparent nausea or vomiting Anesthetic complications: no   No complications documented.  Last Vitals:  Vitals:   04/07/21 1100 04/07/21 1115  BP: (!) 200/121 (!) 179/109  Pulse: 84 88  Resp: 18 15  Temp:    SpO2: 99% 98%    Last Pain:  Vitals:   04/07/21 1100  TempSrc:   PainSc: 0-No pain                 Nichola Cieslinski A.

## 2021-04-07 NOTE — Progress Notes (Signed)
Patient ID: Gina Hunt, female   DOB: 10/13/1946, 75 y.o.   MRN: [id number] Acute left post mastectomy hematoma, discussed need to go back to or and evacuate asap. Will proceed

## 2021-04-07 NOTE — Interval H&P Note (Signed)
History and Physical Interval Note:  04/07/2021 8:17 AM  Gina Hunt  has presented today for surgery, with the diagnosis of LEFT BREAST CANCER.  The various methods of treatment have been discussed with the patient and family. After consideration of risks, benefits and other options for treatment, the patient has consented to  Procedure(s): LEFT SIMPLE MASTECTOMY WITH LEFT AXILLARY SENTINEL NODE BIOPSY (Left) as a surgical intervention.  The patient's history has been reviewed, patient examined, no change in status, stable for surgery.  I have reviewed the patient's chart and labs.  Questions were answered to the patient's satisfaction.     Rolm Bookbinder

## 2021-04-07 NOTE — Progress Notes (Signed)
Nuc med at bedside for left side breast injections.  VSS and pt tolerated procedure well.

## 2021-04-07 NOTE — Interval H&P Note (Signed)
History and Physical Interval Note:  04/07/2021 3:14 PM  Gina Hunt  has presented today for surgery, with the diagnosis of HEMATOMA OF LEFT BREAST.  The various methods of treatment have been discussed with the patient and family. After consideration of risks, benefits and other options for treatment, the patient has consented to  Procedure(s): EVACUATION HEMATOMA BREAST (Left) as a surgical intervention.  The patient's history has been reviewed, patient examined, no change in status, stable for surgery.  I have reviewed the patient's chart and labs.  Questions were answered to the patient's satisfaction.     Rolm Bookbinder

## 2021-04-07 NOTE — Transfer of Care (Signed)
Immediate Anesthesia Transfer of Care Note  Patient: Gina Hunt  Procedure(s) Performed: LEFT SIMPLE MASTECTOMY WITH LEFT AXILLARY SENTINEL NODE BIOPSY (Left Breast) PORTA CATH INSERTION (Right Chest)  Patient Location: PACU  Anesthesia Type:General  Level of Consciousness: sedated  Airway & Oxygen Therapy: Patient Spontanous Breathing and Patient connected to face mask oxygen  Post-op Assessment: Report given to RN and Post -op Vital signs reviewed and stable  Post vital signs: Reviewed and stable  Last Vitals:  Vitals Value Taken Time  BP    Temp    Pulse    Resp    SpO2      Last Pain:  Vitals:   04/07/21 0729  TempSrc: Oral  PainSc: 9       Patients Stated Pain Goal: 5 (25/52/58 9483)  Complications: No complications documented.

## 2021-04-08 ENCOUNTER — Encounter (HOSPITAL_BASED_OUTPATIENT_CLINIC_OR_DEPARTMENT_OTHER): Payer: Self-pay | Admitting: General Surgery

## 2021-04-08 DIAGNOSIS — Z85828 Personal history of other malignant neoplasm of skin: Secondary | ICD-10-CM | POA: Diagnosis not present

## 2021-04-08 DIAGNOSIS — C50412 Malignant neoplasm of upper-outer quadrant of left female breast: Secondary | ICD-10-CM | POA: Diagnosis not present

## 2021-04-08 DIAGNOSIS — L7632 Postprocedural hematoma of skin and subcutaneous tissue following other procedure: Secondary | ICD-10-CM | POA: Diagnosis not present

## 2021-04-08 DIAGNOSIS — C50912 Malignant neoplasm of unspecified site of left female breast: Secondary | ICD-10-CM | POA: Diagnosis not present

## 2021-04-08 DIAGNOSIS — E039 Hypothyroidism, unspecified: Secondary | ICD-10-CM | POA: Diagnosis not present

## 2021-04-08 MED ORDER — TRAMADOL HCL 50 MG PO TABS: 50.0000 mg | ORAL_TABLET | Freq: Four times a day (QID) | ORAL | 0 refills | Status: DC | PRN

## 2021-04-08 MED ORDER — METHOCARBAMOL 500 MG PO TABS: 500.0000 mg | ORAL_TABLET | Freq: Four times a day (QID) | ORAL | 0 refills | Status: DC | PRN

## 2021-04-08 NOTE — Discharge Summary (Signed)
Physician Discharge Summary  Patient ID: ULDINE FUSTER MRN: [id number] DOB/AGE: [birth year] 75 y.o.  Admit date: 04/07/2021 Discharge date: 04/08/2021  Admission Diagnoses: Left breast cancer High oncotype Hypothyroidism  Discharge Diagnoses:  Active Problems:   Breast cancer, left breast [facility])   Discharged Condition: good  Hospital Course: 75 yof underwent left mastectomy/sn biopsy with port placement. About six hours later had acute hematoma and went back to or for evacuation. She did well since then.  Vitals normal. Hb was fine.  She was good for dc the next am  Consults: None  Significant Diagnostic Studies: none  Treatments: surgery: see above  Discharge Exam: Blood pressure (!) 116/58, pulse 84, temperature 99.2 F (37.3 C), resp. rate 18, height 5\' 1"  (1.549 m), weight 61 kg, SpO2 96 %. Ecchymosis around flaps, no hematoma, drain serosang as expected , port in place  Disposition: Discharge disposition: 01-Home or Self Care        Allergies as of 04/08/2021       Reactions   Codeine Other (See Comments)   Hallucination    Morphine And Related Other (See Comments)   Knocks me out per pt        Medication List     TAKE these medications    anastrozole 1 MG tablet Commonly known as: ARIMIDEX Take 1 tablet (1 mg total) by mouth daily.   CALCIUM 600 PO Take 1 tablet by mouth 2 (two) times daily.   DULoxetine 60 MG capsule Commonly known as: CYMBALTA Take 1 capsule by mouth twice daily   gabapentin 300 MG capsule Commonly known as: NEURONTIN Take 1 capsule (300 mg total) by mouth 3 (three) times daily.   levothyroxine 50 MCG tablet Commonly known as: SYNTHROID Take 1 tablet (50 mcg total) by mouth daily before breakfast.   methocarbamol 500 MG tablet Commonly known as: ROBAXIN Take 1 tablet (500 mg total) by mouth every 6 (six) hours as needed for muscle spasms.   omeprazole 40 MG capsule Commonly known as: PRILOSEC TAKE 1 CAPSULE DAILY    simvastatin 40 MG tablet Commonly known as: ZOCOR Take 1 tablet by mouth once daily   traMADol 50 MG tablet Commonly known as: ULTRAM Take 1 tablet (50 mg total) by mouth every 6 (six) hours as needed.        Follow-up Information     Rolm Bookbinder, MD In 2 weeks.   Specialty: General Surgery Contact information: [address] [place] [address] [phone]                 Signed: Rolm Bookbinder 04/08/2021, 4:10 PM

## 2021-04-12 ENCOUNTER — Encounter: Payer: Self-pay | Admitting: *Deleted

## 2021-04-14 LAB — SURGICAL PATHOLOGY

## 2021-04-20 DIAGNOSIS — C50912 Malignant neoplasm of unspecified site of left female breast: Secondary | ICD-10-CM | POA: Diagnosis not present

## 2021-04-26 ENCOUNTER — Ambulatory Visit: Payer: Medicare HMO | Attending: General Surgery | Admitting: Physical Therapy

## 2021-04-26 ENCOUNTER — Encounter: Payer: Self-pay | Admitting: Physical Therapy

## 2021-04-26 ENCOUNTER — Other Ambulatory Visit: Payer: Self-pay

## 2021-04-26 ENCOUNTER — Other Ambulatory Visit: Payer: Self-pay | Admitting: *Deleted

## 2021-04-26 DIAGNOSIS — M25612 Stiffness of left shoulder, not elsewhere classified: Secondary | ICD-10-CM | POA: Insufficient documentation

## 2021-04-26 DIAGNOSIS — C50412 Malignant neoplasm of upper-outer quadrant of left female breast: Secondary | ICD-10-CM | POA: Diagnosis not present

## 2021-04-26 DIAGNOSIS — R293 Abnormal posture: Secondary | ICD-10-CM | POA: Diagnosis not present

## 2021-04-26 DIAGNOSIS — Z170 Estrogen receptor positive status [ER+]: Secondary | ICD-10-CM | POA: Insufficient documentation

## 2021-04-26 DIAGNOSIS — M79602 Pain in left arm: Secondary | ICD-10-CM | POA: Insufficient documentation

## 2021-04-26 DIAGNOSIS — Z483 Aftercare following surgery for neoplasm: Secondary | ICD-10-CM | POA: Insufficient documentation

## 2021-04-26 NOTE — Patient Instructions (Addendum)
            [facility]         [address], [place] [address]         [phone]         Annia Friendly, PT, CLT   After Breast Cancer Class It is recommended you attend the ABC class to be educated on lymphedema risk reduction. This class is free of charge and lasts for 1 hour. It is a 1-time class.  You are scheduled for May 17, 2021 at 11:00. We will send you a link.  Scar massage Hold off on this until your steri-strips are gone and your incisions look healed.  Compression garment Continue wearing your compression bra and foam pad as long it doesn't get to be uncomfortable. This will help with swelling.  Home exercise Program Continue doing the stretches I gave you until you begin PT at your new clinic in [place].   Follow up PT: It is recommended you return every 3 months for the first 3 years following surgery to be assessed on the SOZO machine for an L-Dex score. This helps prevent clinically significant lymphedema in 95% of patients. These follow up screens are 10 minute appointments that you are not billed for. WE ARE SCHEDULED TO MOVE TO [place] August 02, 2021. APPOINTMENTS FOR SOZO SCREENS AFTER 08/02/2021 WILL BE LOCATED AT [facility] AT [address] [place] [address]. Please call us to confirm we have moved if your appointment is scheduled after October 3rd, 2022. The phone number is [phone]. You are scheduled for September 12th at 3:40.

## 2021-04-26 NOTE — Progress Notes (Signed)
[place]  [phone] [phone]     ID: Gina Hunt DOB: 1946-06-25  MR#: [id number]  WCH#:[id number]  Patient Care Team: Gina Pal, DO as PCP - General (Family Medicine) Gina Germany, RN as Oncology Nurse Navigator Gina Kaufmann, RN as Oncology Nurse Navigator Gina Bookbinder, MD as Consulting Physician (General Surgery) , Gina Dad, MD as Consulting Physician (Oncology) Gina Rudd, MD as Consulting Physician (Radiation Oncology) Gina Bison, MD as Consulting Physician (Dermatology) Gina Cruel, MD OTHER MD:   CHIEF COMPLAINT: Estrogen receptor positive breast cancer (s/p left mastectomy)  CURRENT TREATMENT: Adjuvant chemotherapy   INTERVAL HISTORY: Gina Hunt returns today for follow up of her estrogen receptor positive breast cancer.  She is accompanied by her niece Gina Hunt  Since her last visit, she opted to proceed with left mastectomy on 04/07/2021. Pathology from the procedure [id number]) showed: invasive ductal carcinoma, grade 2, 3.2 cm; intermediate grade ductal carcinoma in situ; margins negative.  The biopsied lymph node was negative for carcinoma (0/1).  Surgery was complicated by a postmastectomy hematoma which was evacuated immediately postop.  The patient also had a right internal jugular vein port placed on the same day.  Her case was represented at the breast cancer multidisciplinary conference 04/21/2021.  At that time it was agreed the patient did not need postmastectomy radiation but would need adjuvant chemotherapy, then antiestrogens.   REVIEW OF SYSTEMS: Gina Hunt tells me the surgery "hurt a lot" particularly after the hematoma was evacuated.  She is still very sore and uses ice and Tylenol as well as a muscle relaxant.  She is cutting the muscle relaxant tablet in fourths because she is so sensitive to any medication.  She is monitoring her garden but not working and it yet.  She  says that is too hot.  She has no complaints regarding her port.  She is starting to take little walks and has been able to go up to a mile at a time.  Aside from that a detailed review of systems today was stable.   COVID 19 VACCINATION STATUS: Gina Hunt x2, no booster as of 12/2020   HISTORY OF CURRENT ILLNESS: From the original intake note:  Gina Hunt (pronounced "Gina Hunt") had routine screening mammography on 11/24/2020 showing a possible abnormality in the left breast. She underwent left breast ultrasonography at [facility] on 12/02/2020 showing: breast density category A; 2.5 cm mass in left breast at 2 o'clock (measuring 1.7 cm in ultrasound); no abnormal left axillary lymph nodes.  Accordingly on 12/08/2020 she proceeded to biopsy of the left breast area in question. The pathology from this procedure ([id number]) showed: invasive ductal carcinoma, grade 3. Prognostic indicators significant for: estrogen receptor, 95% positive with strong staining intensity and progesterone receptor, 80% positive with moderate staining intensity. Proliferation marker Ki67 at 30%. HER2 equivocal by immunohistochemistry (2+), but negative by fluorescent in situ hybridization with a signals ratio 1.64 and number per cell 2.95.  Cancer Staging Malignant neoplasm of upper-outer quadrant of left breast in female, estrogen receptor positive ([place]) Staging form: Breast, AJCC 8th Edition - Clinical stage from 12/16/2020: Stage IIA (cT2, cN0, cM0, G3, ER+, PR+, HER2-) - Signed by Gina Cruel, MD on 12/16/2020 Stage prefix: Initial diagnosis  The patient's subsequent history is as detailed below.   PAST MEDICAL HISTORY: Past Medical History:  Diagnosis Date   Anxiety    Breast cancer ([place]) 01/2021   left breast IDC   Complication of anesthesia  Family history of breast cancer 12/16/2020   Family history of gastric cancer 12/16/2020   Fibromyalgia    GERD (gastroesophageal reflux disease)    Hyperlipidemia     Hypothyroidism    Osteoarthritis    knees, hands, fingers   Osteoarthritis    PONV (postoperative nausea and vomiting)    Skin cancer     PAST SURGICAL HISTORY: Past Surgical History:  Procedure Laterality Date   ABDOMINAL HYSTERECTOMY     COLONOSCOPY  11/24/2015   EVACUATION BREAST HEMATOMA Left 04/07/2021   Procedure: EVACUATION HEMATOMA BREAST;  Surgeon: Gina Bookbinder, MD;  Location: [facility];  Service: General;  Laterality: Left;   LIVER SURGERY     PORTA CATH INSERTION Right 04/07/2021   Procedure: PORTA CATH INSERTION;  Surgeon: Gina Bookbinder, MD;  Location: [facility];  Service: General;  Laterality: Right;   SIMPLE MASTECTOMY WITH AXILLARY SENTINEL NODE BIOPSY Left 04/07/2021   Procedure: LEFT SIMPLE MASTECTOMY WITH LEFT AXILLARY SENTINEL NODE BIOPSY;  Surgeon: Gina Bookbinder, MD;  Location: [facility];  Service: General;  Laterality: Left;   TONSILLECTOMY      FAMILY HISTORY: Family History  Problem Relation Age of Onset   Hypertension Mother    Diabetes Mother    Arthritis Mother    Heart disease Mother    Rheum arthritis Father    Diabetes Father    Heart disease Father    Stomach cancer Brother 81   Stomach cancer Brother 67   Esophageal cancer Brother 22   Breast cancer Maternal Aunt 70   Breast cancer Maternal Aunt 72   Breast cancer Maternal Aunt 73   Colon cancer Neg Hx    Colon polyps Neg Hx   Her father and mother both died from [place], father age 106 and mother age 16. [name] has 6 brothers and 1 sister. She reports breast cancer in 3 maternal aunts, one in their 61's. One of her maternal aunt's daughter (Claryce's cousin) had breast cancer at age 35.The patient also reports stomach cancer in two of her brothers in their 40's, one of whom also had esophageal cancer.    GYNECOLOGIC HISTORY:  No LMP recorded. Patient has had a hysterectomy. Menarche: 75 years old Age at first live birth: 74 years old [place] P  1 LMP age 74 Contraceptive: never used HRT never used  Hysterectomy? yes BSO? yes   SOCIAL HISTORY: (updated 12/2020)  Annamay is currently retired from working at the [employer] in [place] died late February 0277 from complications of Agent Orange exposure . She lives at home with [name]. Daughter [name] lives in [place], the patient believes, but they have been alienated for many years.    ADVANCED DIRECTIVES: not in place; intends to name her niece, [name], as her HCPOA.  The patient was given the appropriate documents to complete and notarized at her discretion on her 12/16/2020 visit   HEALTH MAINTENANCE: Social History   Tobacco Use   Smoking status: Never   Smokeless tobacco: Never  Vaping Use   Vaping Use: Never used  Substance Use Topics   Alcohol use: Yes    Comment: rarely    Drug use: Never     Colonoscopy: 05/2019 (Dr. Bryan Lemma), recall 2027  PAP: "10 years ago" (~2012)  Bone density: 2020   Allergies  Allergen Reactions   Codeine Other (See Comments)    Hallucination    Morphine And Related Other (See Comments)  Knocks me out per pt    Current Outpatient Medications  Medication Sig Dispense Refill   anastrozole (ARIMIDEX) 1 MG tablet Take 1 tablet (1 mg total) by mouth daily. 90 tablet 4   Calcium Carbonate (CALCIUM 600 PO) Take 1 tablet by mouth 2 (two) times daily.     dexamethasone (DECADRON) 4 MG tablet Take 1 tablet (4 mg total) by mouth daily. Start the day after chemotherapy for 2 days. Take with food. 30 tablet 1   DULoxetine (CYMBALTA) 60 MG capsule Take 1 capsule by mouth twice daily 60 capsule 0   gabapentin (NEURONTIN) 300 MG capsule Take 1 capsule (300 mg total) by mouth 3 (three) times daily. 270 capsule 1   levothyroxine (SYNTHROID) 50 MCG tablet Take 1 tablet (50 mcg total) by mouth daily before breakfast. 90 tablet 2   lidocaine-prilocaine (EMLA) cream Apply to affected area once 30 g 3    methocarbamol (ROBAXIN) 500 MG tablet Take 1 tablet (500 mg total) by mouth every 6 (six) hours as needed for muscle spasms. 20 tablet 0   omeprazole (PRILOSEC) 40 MG capsule TAKE 1 CAPSULE DAILY 90 capsule 1   prochlorperazine (COMPAZINE) 10 MG tablet Take 1 tablet (10 mg total) by mouth every 6 (six) hours as needed (Nausea or vomiting). 30 tablet 1   simvastatin (ZOCOR) 40 MG tablet Take 1 tablet by mouth once daily 30 tablet 0   traMADol (ULTRAM) 50 MG tablet Take 1 tablet (50 mg total) by mouth every 6 (six) hours as needed. 10 tablet 0   No current facility-administered medications for this visit.    OBJECTIVE: White woman who appears stated  Vitals:   04/27/21 1248  BP: (!) 147/81  Pulse: 76  Resp: 18  Temp: 97.7 F (36.5 C)  SpO2: 97%     Body mass index is 24.75 kg/m.   Wt Readings from Last 3 Encounters:  04/27/21 131 lb (59.4 kg)  04/07/21 134 lb 7.7 oz (61 kg)  01/05/21 132 lb 8 oz (60.1 kg)      ECOG FS:1 - Symptomatic but completely ambulatory  Sclerae unicteric, EOMs intact Wearing a mask No cervical or supraclavicular adenopathy Lungs no rales or rhonchi Heart regular rate and rhythm Abd soft, nontender, positive bowel sounds MSK no focal spinal tenderness, no upper extremity lymphedema Neuro: nonfocal, well oriented, appropriate affect Breasts: Right breast is benign per the left breast is status post mastectomy.  The incision is irregular.  There is no dehiscence erythema or swelling.  There is no evidence of residual or recurrent disease.  Both axillae are benign.   LAB RESULTS:  CMP     Component Value Date/Time   NA 141 04/27/2021 1236   K 3.7 04/27/2021 1236   CL 104 04/27/2021 1236   CO2 27 04/27/2021 1236   GLUCOSE 132 (H) 04/27/2021 1236   BUN 12 04/27/2021 1236   CREATININE 0.72 04/27/2021 1236   CREATININE 0.58 (L) 12/20/2019 1422   CALCIUM 8.9 04/27/2021 1236   PROT 6.8 04/27/2021 1236   ALBUMIN 3.4 (L) 04/27/2021 1236   AST 15  04/27/2021 1236   ALT 11 04/27/2021 1236   ALKPHOS 75 04/27/2021 1236   BILITOT 0.3 04/27/2021 1236   GFRNONAA >60 04/27/2021 1236    No results found for: TOTALPROTELP, ALBUMINELP, A1GS, A2GS, BETS, BETA2SER, GAMS, MSPIKE, SPEI  Lab Results  Component Value Date   WBC 7.4 04/27/2021   NEUTROABS 4.0 04/27/2021   HGB 12.0 04/27/2021   HCT  36.1 04/27/2021   MCV 96.0 04/27/2021   PLT 337 04/27/2021    No results found for: LABCA2  No components found for: GEXBMW413  No results for input(s): INR in the last 168 hours.  No results found for: LABCA2  No results found for: KGM010  No results found for: UVO536  No results found for: UYQ034  No results found for: CA2729  No components found for: HGQUANT  No results found for: CEA1 / No results found for: CEA1   No results found for: AFPTUMOR  No results found for: CHROMOGRNA  No results found for: KPAFRELGTCHN, LAMBDASER, KAPLAMBRATIO (kappa/lambda light chains)  No results found for: HGBA, HGBA2QUANT, HGBFQUANT, HGBSQUAN (Hemoglobinopathy evaluation)   No results found for: LDH  No results found for: IRON, TIBC, IRONPCTSAT (Iron and TIBC)  No results found for: FERRITIN  Urinalysis No results found for: COLORURINE, APPEARANCEUR, LABSPEC, PHURINE, GLUCOSEU, HGBUR, BILIRUBINUR, KETONESUR, PROTEINUR, UROBILINOGEN, NITRITE, LEUKOCYTESUR   STUDIES: DG Chest 1 View  Result Date: 04/07/2021 CLINICAL DATA:  Port-A-Cath placement. EXAM: DG C-ARM 1-60 MIN; CHEST  1 VIEW FLUOROSCOPY TIME:  Fluoroscopy Time:  17 seconds. Radiation Exposure Index (if provided by the fluoroscopic device): 1.91 mGy. Number of Acquired Spot Images: 1 COMPARISON:  None. FINDINGS: A single C-arm fluoroscopic images was obtained intraoperatively and submitted for post operative interpretation. This image demonstrates a right IJ approach Port-A-Cath with the tip projecting in the expected region of the superior cavoatrial junction when accounting  for patient rotation. No evidence of a kink along the catheter. No visible large pneumothorax on this limited single fluoroscopic image. Please see the performing provider's procedural report for further detail. IMPRESSION: Intraoperative fluoroscopy, as detailed above. Electronically Signed   By: Margaretha Sheffield MD   On: 04/07/2021 13:36   NM Sentinel Node Inj-No Rpt (Breast)  Result Date: 04/07/2021 Sulfur Colloid was injected by the Nuclear Medicine Technologist for sentinel lymph node localization.   DG C-Arm 1-60 Min  Result Date: 04/07/2021 CLINICAL DATA:  Port-A-Cath placement. EXAM: DG C-ARM 1-60 MIN; CHEST  1 VIEW FLUOROSCOPY TIME:  Fluoroscopy Time:  17 seconds. Radiation Exposure Index (if provided by the fluoroscopic device): 1.91 mGy. Number of Acquired Spot Images: 1 COMPARISON:  None. FINDINGS: A single C-arm fluoroscopic images was obtained intraoperatively and submitted for post operative interpretation. This image demonstrates a right IJ approach Port-A-Cath with the tip projecting in the expected region of the superior cavoatrial junction when accounting for patient rotation. No evidence of a kink along the catheter. No visible large pneumothorax on this limited single fluoroscopic image. Please see the performing provider's procedural report for further detail. IMPRESSION: Intraoperative fluoroscopy, as detailed above. Electronically Signed   By: Margaretha Sheffield MD   On: 04/07/2021 13:36     ELIGIBLE FOR AVAILABLE RESEARCH PROTOCOL: no  ASSESSMENT: 75 y.o. [place], [place] woman status post left breast upper outer quadrant biopsy 12/08/2020 for a clinical T2N0, stage IIA invasive ductal carcinoma, grade 3, estrogen and progesterone receptor positive, HER-2 not amplified, with an MIB-1 of 30%  (1) Oncotype obtained from the original biopsy shows a score of 32, predicting a risk of recurrence outside the breast in the next 9 years of 20% if node-negative, 25% if node positive, if  antiestrogens are the only systemic treatment, also predicting a greater than 15% benefit from chemotherapy  (2) genetics testing 01/01/2021 through the [facility] CustomNext-Cancer +RNAinsight Panel found no deleterious mutations in APC, ATM, AXIN2, BARD1, BMPR1A, BRCA1, BRCA2, BRIP1, CDH1, CDK4, CDKN2A,  CHEK2, DICER1, EPCAM, GREM1, HOXB13, MEN1, MLH1, MSH2, MSH3, MSH6, MUTYH, NBN, NF1, NF2, NTHL1, PALB2, PMS2, POLD1, POLE, PTEN, RAD51C, RAD51D, RECQL, RET, SDHA, SDHAF2, SDHB, SDHC, SDHD, SMAD4, SMARCA4, STK11, TP53, TSC1, TSC2, and VHL.  RNA data is routinely analyzed for use in variant interpretation for all genes.  (3) anastrozole started neoadjuvantly 12/16/2020, discontinued 03/2021 with multiple side effect  (4) status post left mastectomy and sentinel lymph node sampling 04/07/2021 for a pT2 pN0, stage IIA invasive ductal carcinoma, grade 2, with negative margins  (A) a single left axilla lymph node removed  (B) repeat prognostic panel finds the cancer estrogen receptor positive, progesterone receptor and HER2 negative.  (5) adjuvant chemotherapy (CMF) starting 05/17/2021, to be repeated times  (6) adjuvant tamoxifen to follow   PLAN: Julio has pretty much recovered from her surgery.  Today we reviewed her pathology and she understands she has a stage II breast cancer and we again reviewed the option of a short but intense treatment (Cytoxan/Taxotere) versus a less intense but more prolonged treatment (CMF).  She is very clear that she strongly prefers today latter and given her age and her history of problems with medication I think that is a good choice in her case  She feels she will be able to start treatment on July 18 and we are scheduling that.  She will meet with our chemotherapy teaching nurse today to review how to take her supportive medicines.  I am going to see her about 10 days after the first cycle to check for nadir counts and make sure that she is taking supportive medicines  appropriately.  If the nadir is very low we will need to add pegfilgrastim  She knows to call for any other issue that may develop before the next visit.  Total encounter time 35 minutes.Sarajane Jews C. , MD 04/27/2021 2:55 PM Medical Oncology and Hematology [facility] [place], [place] [address] Tel. [phone]    Fax. [phone]   This document serves as a record of services personally performed by Lurline Del, MD. It was created on his behalf by Wilburn Mylar, a trained medical scribe. The creation of this record is based on the scribe's personal observations and the provider's statements to them.   I, Lurline Del MD, have reviewed the above documentation for accuracy and completeness, and I agree with the above.   *Total Encounter Time as defined by the Centers for Medicare and Medicaid Services includes, in addition to the face-to-face time of a patient visit (documented in the note above) non-face-to-face time: obtaining and reviewing outside history, ordering and reviewing medications, tests or procedures, care coordination (communications with other health care professionals or caregivers) and documentation in the medical record.

## 2021-04-26 NOTE — Therapy (Signed)
[place], [place], [address] Phone: [phone]   Fax:  [phone]  Physical Therapy Treatment  Patient Details  Name: Gina Hunt MRN: [id number] Date of Birth: [birth year] Referring Provider (PT): Dr. Rolm Bookbinder   Encounter Date: 04/26/2021   PT End of Session - 04/26/21 1211     Visit Number 2    Number of Visits 2    PT Start Time 1100    PT Stop Time 1206    PT Time Calculation (min) 66 min    Activity Tolerance Patient tolerated treatment well    Behavior During Therapy [facility] for tasks assessed/performed             Past Medical History:  Diagnosis Date   Anxiety    Breast cancer ([place]) 01/2021   left breast IDC   Complication of anesthesia    Family history of breast cancer 12/16/2020   Family history of gastric cancer 12/16/2020   Fibromyalgia    GERD (gastroesophageal reflux disease)    Hyperlipidemia    Hypothyroidism    Osteoarthritis    knees, hands, fingers   Osteoarthritis    PONV (postoperative nausea and vomiting)    Skin cancer     Past Surgical History:  Procedure Laterality Date   ABDOMINAL HYSTERECTOMY     COLONOSCOPY  11/24/2015   EVACUATION BREAST HEMATOMA Left 04/07/2021   Procedure: EVACUATION HEMATOMA BREAST;  Surgeon: Rolm Bookbinder, MD;  Location: [place];  Service: General;  Laterality: Left;   LIVER SURGERY     PORTA CATH INSERTION Right 04/07/2021   Procedure: PORTA CATH INSERTION;  Surgeon: Rolm Bookbinder, MD;  Location: [place];  Service: General;  Laterality: Right;   SIMPLE MASTECTOMY WITH AXILLARY SENTINEL NODE BIOPSY Left 04/07/2021   Procedure: LEFT SIMPLE MASTECTOMY WITH LEFT AXILLARY SENTINEL NODE BIOPSY;  Surgeon: Rolm Bookbinder, MD;  Location: [place];  Service: General;  Laterality: Left;   TONSILLECTOMY      There were no vitals filed for this visit.   Subjective  Assessment - 04/26/21 1104     Subjective Patient reports she underwent a left mastectomy and sentinel node biopsy (1 negative node) on 04/07/2021. She is unsure about treatment beyond surgery but she had a port placed at the time of surgery.    Pertinent History Patient was diagnosed on 11/24/2020 with left grade III invasive ductal carcinoma breast cancer. She underwent a left mastectomy and sentinel node biopsy (1 negative node) on 04/07/2021. It is ER/PR positive and HER2 negative with a Ki67 of 30%.    Patient Stated Goals Get my arm back to normal    Currently in Pain? Yes    Pain Score 8     Pain Location Axilla    Pain Orientation Left    Pain Descriptors / Indicators Throbbing;Dull;Aching    Pain Type Surgical pain    Pain Onset 1 to 4 weeks ago    Pain Frequency Constant    Aggravating Factors  Using my arm    Pain Relieving Factors Pain medication    Multiple Pain Sites No                [facility] PT Assessment - 04/26/21 0001       Assessment   Medical Diagnosis s/p left mastectomy and SLNB    Referring Provider (PT) Dr. Rolm Bookbinder    Onset Date/Surgical Date 04/07/21    Hand Dominance  Right    Next MD Visit 04/30/2021    Prior Therapy Baselines      Precautions   Precautions Other (comment)    Precaution Comments recent surgery and left arm lymphedema risk      Restrictions   Weight Bearing Restrictions No      Balance Screen   Has the patient fallen in the past 6 months No    Has the patient had a decrease in activity level because of a fear of falling?  No    Is the patient reluctant to leave their home because of a fear of falling?  No      Home Ecologist residence    Living Arrangements Spouse/significant other    Available Help at Discharge Family      Prior Function   Level of [place] Retired    Leisure She is not exercising      Cognition   Overall Cognitive Status Within Functional  Limits for tasks assessed      Posture/Postural Control   Posture/Postural Control Postural limitations    Postural Limitations Rounded Shoulders;Forward head;Increased thoracic kyphosis      ROM / Strength   AROM / PROM / Strength AROM      AROM   AROM Assessment Site Shoulder    Right/Left Shoulder Left    Left Shoulder Extension 31 Degrees    Left Shoulder Flexion 121 Degrees    Left Shoulder ABduction 115 Degrees    Left Shoulder Internal Rotation 59 Degrees    Left Shoulder External Rotation 72 Degrees      Strength   Overall Strength Unable to assess;Due to precautions               LYMPHEDEMA/ONCOLOGY QUESTIONNAIRE - 04/26/21 0001       Type   Cancer Type Left breast      Surgeries   Mastectomy Date 04/07/21    Sentinel Lymph Node Biopsy Date 04/07/21    Number Lymph Nodes Removed 1      Treatment   Active Chemotherapy Treatment No    Past Chemotherapy Treatment No    Active Radiation Treatment No    Past Radiation Treatment No    Current Hormone Treatment No    Past Hormone Therapy No      What other symptoms do you have   Are you Having Heaviness or Tightness Yes    Are you having Pain Yes    Are you having pitting edema No    Is it Hard or Difficult finding clothes that fit No    Do you have infections No    Is there Decreased scar mobility Yes    Stemmer Sign No      Lymphedema Assessments   Lymphedema Assessments Upper extremities      Right Upper Extremity Lymphedema   10 cm Proximal to Olecranon Process 25.2 cm    Olecranon Process 22.5 cm    10 cm Proximal to Ulnar Styloid Process 20.2 cm    Just Proximal to Ulnar Styloid Process 14.3 cm    Across Hand at [employer] 18.5 cm    At [place] of 2nd Digit 6 cm      Left Upper Extremity Lymphedema   10 cm Proximal to Olecranon Process 25.1 cm    Olecranon Process 22.5 cm    10 cm Proximal to Ulnar Styloid Process 18.7 cm    Just Proximal to  Ulnar Styloid Process 14 cm    Across Hand  at [employer] 17.5 cm    At [place] of 2nd Digit 6 cm                Quick Dash - 04/26/21 0001     Open a tight or new jar Severe difficulty    Do heavy household chores (wash walls, wash floors) Moderate difficulty    Carry a shopping bag or briefcase Severe difficulty    Wash your back Unable    Use a knife to cut food Unable    Recreational activities in which you take some force or impact through your arm, shoulder, or hand (golf, hammering, tennis) Unable    During the past week, to what extent has your arm, shoulder or hand problem interfered with your normal social activities with family, friends, neighbors, or groups? Modererately    During the past week, to what extent has your arm, shoulder or hand problem limited your work or other regular daily activities Quite a bit    Arm, shoulder, or hand pain. Severe    Tingling (pins and needles) in your arm, shoulder, or hand Moderate    Difficulty Sleeping Moderate difficulty    DASH Score 72.73 %                            PT Education - 04/26/21 1206     Education Details Lymphedema education and post op HEP; Aftercare    Person(s) Educated Patient;Caregiver(s)   friend/neighbor   Methods Explanation;Demonstration;Handout    Comprehension Returned demonstration;Verbalized understanding                 PT Long Term Goals - 04/26/21 1222       PT LONG TERM GOAL #1   Title Patient will demonstrate she has regained full shoulder ROM and function post operatively compared to baselines.    Time 8    Period Weeks    Status Not Met                   Plan - 04/26/21 1214     Clinical Impression Statement Patient is having significant pain in her left chest, axilla and  lateral trunk since having a left mastectomy and sentinel node biopsy on 04/07/2021. She has regained about 75% of her shoulder ROM but is limited by pain. There is moderate edema present in her left lateral trunk just  inferior to her axilla. Her incisions are healing but strei-strips are sitll in place so it is difficult to fully assess incisions. She was given a knitted knocker today to help reduce the discomfort in her chest at her incision site and was issued a compression foam pad for her lateral trunk to place between her skin and bra. She will benefit from physical therapy to regain shoulder ROM and function and to decrease pain, but she lives in [place] and requested to go somewhere closer to home called [facility]. Sent in-basket message to surgeon's nurse to request an order be faxed to [facility]. Also let pt know if she is unhappy there, we are happy ot see her back here for PT.    PT Treatment/Interventions ADLs/Self Care Home Management;Therapeutic exercise;Patient/family education    PT Next Visit Plan D/C per pt request to go to PT closer to home    PT Home Exercise Plan Post op shoulder ROM HEP  Consulted and Agree with Plan of Care Patient;Other (Comment)   friend            Patient will benefit from skilled therapeutic intervention in order to improve the following deficits and impairments:  Postural dysfunction, Decreased range of motion, Decreased knowledge of precautions, Impaired UE functional use, Pain, Decreased scar mobility  Visit Diagnosis: Malignant neoplasm of upper-outer quadrant of left breast in female, estrogen receptor positive (HCC)  Abnormal posture  Stiffness of left shoulder, not elsewhere classified  Pain in left arm  Aftercare following surgery for neoplasm     Problem List Patient Active Problem List   Diagnosis Date Noted   Breast cancer, left breast ([place]) 04/07/2021   Genetic testing 12/29/2020   Family history of breast cancer 12/16/2020   Family history of gastric cancer 12/16/2020   Malignant neoplasm of upper-outer quadrant of left breast in female, estrogen receptor positive ([place]) 12/14/2020   DNR (do not resuscitate) 12/20/2019   Hyperlipidemia  11/12/2018   Stress incontinence 10/12/2018   Other fatigue 05/19/2017   Dyslipidemia 03/08/2017   History of peptic ulcer 03/06/2017   History of hypothyroidism 03/06/2017   Fibromyalgia 09/06/2016   Primary osteoarthritis of both knees 09/06/2016   Primary osteoarthritis of both hands 09/06/2016   DJD (degenerative joint disease), cervical 09/06/2016   Peptic ulcer disease 09/06/2016   Hypothyroidism 09/06/2016    PHYSICAL THERAPY DISCHARGE SUMMARY  Visits from Start of Care: 2  Current functional level related to goals / functional outcomes: See above for objective measurements   Remaining deficits: Significantly limited shoulder ROM and function; pain   Education / Equipment: Lymphedema education and review of HEP  Patient agrees to discharge. Patient goals were not met. Patient is being discharged due to the patient's request. She requests to go to PT closer to home.  Annia Friendly, [place] 04/26/21 12:23 PM   [place], [place], [address] Phone: [phone]   Fax:  [phone]  Name: Gina Hunt MRN: [id number] Date of Birth: July 28, 1946

## 2021-04-27 ENCOUNTER — Inpatient Hospital Stay: Payer: Medicare HMO

## 2021-04-27 ENCOUNTER — Inpatient Hospital Stay: Payer: Medicare HMO | Attending: Oncology | Admitting: Oncology

## 2021-04-27 ENCOUNTER — Telehealth: Payer: Self-pay

## 2021-04-27 VITALS — BP 147/81 | HR 76 | Temp 97.7°F | Resp 18 | Ht 61.0 in | Wt 131.0 lb

## 2021-04-27 DIAGNOSIS — Z9012 Acquired absence of left breast and nipple: Secondary | ICD-10-CM | POA: Diagnosis not present

## 2021-04-27 DIAGNOSIS — Z170 Estrogen receptor positive status [ER+]: Secondary | ICD-10-CM

## 2021-04-27 DIAGNOSIS — C50412 Malignant neoplasm of upper-outer quadrant of left female breast: Secondary | ICD-10-CM | POA: Insufficient documentation

## 2021-04-27 LAB — CMP (CANCER CENTER ONLY)
ALT: 11 U/L (ref 0–44)
AST: 15 U/L (ref 15–41)
Albumin: 3.4 g/dL — ABNORMAL LOW (ref 3.5–5.0)
Alkaline Phosphatase: 75 U/L (ref 38–126)
Anion gap: 10 (ref 5–15)
BUN: 12 mg/dL (ref 8–23)
CO2: 27 mmol/L (ref 22–32)
Calcium: 8.9 mg/dL (ref 8.9–10.3)
Chloride: 104 mmol/L (ref 98–111)
Creatinine: 0.72 mg/dL (ref 0.44–1.00)
GFR, Estimated: >60 mL/min (ref >60)
Glucose, Bld: 132 mg/dL — ABNORMAL HIGH (ref 70–99)
Potassium: 3.7 mmol/L (ref 3.5–5.1)
Sodium: 141 mmol/L (ref 135–145)
Total Bilirubin: 0.3 mg/dL (ref 0.3–1.2)
Total Protein: 6.8 g/dL (ref 6.5–8.1)

## 2021-04-27 LAB — CBC WITH DIFFERENTIAL (CANCER CENTER ONLY)
Abs Immature Granulocytes: 0.01 10*3/uL (ref 0.00–0.07)
Basophils Absolute: 0.1 10*3/uL (ref 0.0–0.1)
Basophils Relative: 1 %
Eosinophils Absolute: 0.6 10*3/uL — ABNORMAL HIGH (ref 0.0–0.5)
Eosinophils Relative: 7 %
HCT: 36.1 % (ref 36.0–46.0)
Hemoglobin: 12 g/dL (ref 12.0–15.0)
Immature Granulocytes: 0 %
Lymphocytes Relative: 27 %
Lymphs Abs: 2 10*3/uL (ref 0.7–4.0)
MCH: 31.9 pg (ref 26.0–34.0)
MCHC: 33.2 g/dL (ref 30.0–36.0)
MCV: 96 fL (ref 80.0–100.0)
Monocytes Absolute: 0.8 10*3/uL (ref 0.1–1.0)
Monocytes Relative: 11 %
Neutro Abs: 4 10*3/uL (ref 1.7–7.7)
Neutrophils Relative %: 54 %
Platelet Count: 337 10*3/uL (ref 150–400)
RBC: 3.76 MIL/uL — ABNORMAL LOW (ref 3.87–5.11)
RDW: 13.3 % (ref 11.5–15.5)
WBC Count: 7.4 10*3/uL (ref 4.0–10.5)
nRBC: 0 % (ref 0.0–0.2)

## 2021-04-27 MED ORDER — LIDOCAINE-PRILOCAINE 2.5-2.5 % EX CREA: TOPICAL_CREAM | CUTANEOUS | 3 refills | Status: DC

## 2021-04-27 MED ORDER — DEXAMETHASONE 4 MG PO TABS: 4.0000 mg | ORAL_TABLET | Freq: Every day | ORAL | 1 refills | Status: DC

## 2021-04-27 MED ORDER — PROCHLORPERAZINE MALEATE 10 MG PO TABS: 10.0000 mg | ORAL_TABLET | Freq: Four times a day (QID) | ORAL | 1 refills | Status: DC | PRN

## 2021-04-27 NOTE — Progress Notes (Signed)
START OFF PATHWAY REGIMEN - Breast   [id number]:CMF (IV cyclophosphamide) q21 days:   A cycle is every 21 days:     Cyclophosphamide      Methotrexate      Fluorouracil   **Always confirm dose/schedule in your pharmacy ordering system**  Patient Characteristics: Postoperative without Neoadjuvant Therapy (Pathologic Staging), Invasive Disease, Adjuvant Therapy, HER2 Negative/Unknown/Equivocal, ER Positive, Node Negative, pT1a-c, pN0/N51m or pT2 or Higher, pN0, Oncotype High Risk (? 26) Therapeutic Status: Postoperative without Neoadjuvant Therapy (Pathologic Staging) AJCC Grade: G2 AJCC N Category: pN0 AJCC M Category: cM0 ER Status: Positive (+) AJCC 8 Stage Grouping: IIA HER2 Status: Negative (-) Oncotype Dx Recurrence Score: 32 AJCC T Category: pT2 PR Status: Negative (-) Adjuvant Therapy Status: No Adjuvant Therapy Received Yet or Changing Initial Adjuvant Regimen due to Tolerance Has this patient completed genomic testing<= Yes - Oncotype DX(R) Intent of Therapy: Curative Intent, Discussed with Patient

## 2021-04-27 NOTE — Telephone Encounter (Signed)
Prior Authorization for EMLA Cream approved  through 07/26/2021. Patient notified.

## 2021-04-29 ENCOUNTER — Other Ambulatory Visit: Payer: Self-pay | Admitting: Family Medicine

## 2021-04-29 ENCOUNTER — Encounter: Payer: Self-pay | Admitting: *Deleted

## 2021-05-04 DIAGNOSIS — M25612 Stiffness of left shoulder, not elsewhere classified: Secondary | ICD-10-CM | POA: Diagnosis not present

## 2021-05-04 DIAGNOSIS — M25512 Pain in left shoulder: Secondary | ICD-10-CM | POA: Diagnosis not present

## 2021-05-04 MED ORDER — OMEPRAZOLE 40 MG PO CPDR: 40.0000 mg | DELAYED_RELEASE_CAPSULE | Freq: Every day | ORAL | 0 refills | Status: DC

## 2021-05-06 DIAGNOSIS — M25512 Pain in left shoulder: Secondary | ICD-10-CM | POA: Diagnosis not present

## 2021-05-06 DIAGNOSIS — M25612 Stiffness of left shoulder, not elsewhere classified: Secondary | ICD-10-CM | POA: Diagnosis not present

## 2021-05-10 DIAGNOSIS — M25612 Stiffness of left shoulder, not elsewhere classified: Secondary | ICD-10-CM | POA: Diagnosis not present

## 2021-05-10 DIAGNOSIS — M25512 Pain in left shoulder: Secondary | ICD-10-CM | POA: Diagnosis not present

## 2021-05-10 NOTE — Progress Notes (Signed)
Pharmacist Chemotherapy Monitoring - Initial Assessment    Anticipated start date: 05/17/21   The following has been reviewed per standard work regarding the patient's treatment regimen: The patient's diagnosis, treatment plan and drug doses, and organ/hematologic function Lab orders and baseline tests specific to treatment regimen  The treatment plan start date, drug sequencing, and pre-medications Prior authorization status  Patient's documented medication list, including drug-drug interaction screen and prescriptions for anti-emetics and supportive care specific to the treatment regimen The drug concentrations, fluid compatibility, administration routes, and timing of the medications to be used The patient's access for treatment and lifetime cumulative dose history, if applicable  The patient's medication allergies and previous infusion related reactions, if applicable   Changes made to treatment plan:  N/A  Follow up needed:  N/A   Larene Beach, RPH, 05/10/2021  2:53 PM

## 2021-05-13 DIAGNOSIS — M25612 Stiffness of left shoulder, not elsewhere classified: Secondary | ICD-10-CM | POA: Diagnosis not present

## 2021-05-13 DIAGNOSIS — C50912 Malignant neoplasm of unspecified site of left female breast: Secondary | ICD-10-CM | POA: Diagnosis not present

## 2021-05-13 DIAGNOSIS — M25512 Pain in left shoulder: Secondary | ICD-10-CM | POA: Diagnosis not present

## 2021-05-17 ENCOUNTER — Other Ambulatory Visit: Payer: Self-pay

## 2021-05-17 ENCOUNTER — Inpatient Hospital Stay: Payer: Medicare HMO | Attending: Oncology

## 2021-05-17 ENCOUNTER — Inpatient Hospital Stay: Payer: Medicare HMO

## 2021-05-17 ENCOUNTER — Encounter: Payer: Self-pay | Admitting: *Deleted

## 2021-05-17 VITALS — BP 169/85 | HR 64 | Temp 98.1°F | Resp 18

## 2021-05-17 DIAGNOSIS — C50412 Malignant neoplasm of upper-outer quadrant of left female breast: Secondary | ICD-10-CM | POA: Diagnosis present

## 2021-05-17 DIAGNOSIS — Z95828 Presence of other vascular implants and grafts: Secondary | ICD-10-CM

## 2021-05-17 DIAGNOSIS — Z170 Estrogen receptor positive status [ER+]: Secondary | ICD-10-CM

## 2021-05-17 DIAGNOSIS — Z5111 Encounter for antineoplastic chemotherapy: Secondary | ICD-10-CM | POA: Insufficient documentation

## 2021-05-17 LAB — CBC WITH DIFFERENTIAL/PLATELET
Abs Immature Granulocytes: 0.02 10*3/uL (ref 0.00–0.07)
Basophils Absolute: 0 10*3/uL (ref 0.0–0.1)
Basophils Relative: 1 %
Eosinophils Absolute: 0.4 10*3/uL (ref 0.0–0.5)
Eosinophils Relative: 6 %
HCT: 37.2 % (ref 36.0–46.0)
Hemoglobin: 12.5 g/dL (ref 12.0–15.0)
Immature Granulocytes: 0 %
Lymphocytes Relative: 26 %
Lymphs Abs: 1.9 10*3/uL (ref 0.7–4.0)
MCH: 31.6 pg (ref 26.0–34.0)
MCHC: 33.6 g/dL (ref 30.0–36.0)
MCV: 94.2 fL (ref 80.0–100.0)
Monocytes Absolute: 0.8 10*3/uL (ref 0.1–1.0)
Monocytes Relative: 11 %
Neutro Abs: 3.9 10*3/uL (ref 1.7–7.7)
Neutrophils Relative %: 56 %
Platelets: 292 10*3/uL (ref 150–400)
RBC: 3.95 MIL/uL (ref 3.87–5.11)
RDW: 13 % (ref 11.5–15.5)
WBC: 7.1 10*3/uL (ref 4.0–10.5)
nRBC: 0 % (ref 0.0–0.2)

## 2021-05-17 LAB — COMPREHENSIVE METABOLIC PANEL
ALT: 11 U/L (ref 0–44)
AST: 16 U/L (ref 15–41)
Albumin: 3.7 g/dL (ref 3.5–5.0)
Alkaline Phosphatase: 79 U/L (ref 38–126)
Anion gap: 7 (ref 5–15)
BUN: 7 mg/dL — ABNORMAL LOW (ref 8–23)
CO2: 28 mmol/L (ref 22–32)
Calcium: 8.8 mg/dL — ABNORMAL LOW (ref 8.9–10.3)
Chloride: 107 mmol/L (ref 98–111)
Creatinine, Ser: 0.68 mg/dL (ref 0.44–1.00)
GFR, Estimated: >60 mL/min (ref >60)
Glucose, Bld: 101 mg/dL — ABNORMAL HIGH (ref 70–99)
Potassium: 4 mmol/L (ref 3.5–5.1)
Sodium: 142 mmol/L (ref 135–145)
Total Bilirubin: 0.4 mg/dL (ref 0.3–1.2)
Total Protein: 7.1 g/dL (ref 6.5–8.1)

## 2021-05-17 MED ORDER — SODIUM CHLORIDE 0.9% FLUSH
10.0000 mL | INTRAVENOUS | Status: DC | PRN
  Administered 2021-05-17: 10 mL
  Filled 2021-05-17: qty 10

## 2021-05-17 MED ORDER — SODIUM CHLORIDE 0.9 % IV SOLN
600.0000 mg/m2 | Freq: Once | INTRAVENOUS | Status: AC
  Administered 2021-05-17: 980 mg via INTRAVENOUS
  Filled 2021-05-17: qty 49

## 2021-05-17 MED ORDER — PALONOSETRON HCL INJECTION 0.25 MG/5ML
0.2500 mg | Freq: Once | INTRAVENOUS | Status: AC
  Administered 2021-05-17: 0.25 mg via INTRAVENOUS

## 2021-05-17 MED ORDER — PALONOSETRON HCL INJECTION 0.25 MG/5ML
INTRAVENOUS | Status: AC
  Filled 2021-05-17: qty 5

## 2021-05-17 MED ORDER — SODIUM CHLORIDE 0.9 % IV SOLN
Freq: Once | INTRAVENOUS | Status: AC
  Filled 2021-05-17: qty 250

## 2021-05-17 MED ORDER — METHOTREXATE SODIUM (PF) CHEMO INJECTION 250 MG/10ML
40.1000 mg/m2 | Freq: Once | INTRAMUSCULAR | Status: AC
  Administered 2021-05-17: 65 mg via INTRAVENOUS
  Filled 2021-05-17: qty 2.6

## 2021-05-17 MED ORDER — SODIUM CHLORIDE 0.9 % IV SOLN
10.0000 mg | Freq: Once | INTRAVENOUS | Status: AC
  Administered 2021-05-17: 10 mg via INTRAVENOUS
  Filled 2021-05-17: qty 10

## 2021-05-17 MED ORDER — FLUOROURACIL CHEMO INJECTION 2.5 GM/50ML
600.0000 mg/m2 | Freq: Once | INTRAVENOUS | Status: AC
  Administered 2021-05-17: 950 mg via INTRAVENOUS
  Filled 2021-05-17: qty 19

## 2021-05-17 MED ORDER — SODIUM CHLORIDE 0.9% FLUSH
10.0000 mL | INTRAVENOUS | Status: AC | PRN
Start: 2021-05-17 — End: 2021-05-17
  Administered 2021-05-17: 10 mL
  Filled 2021-05-17: qty 10

## 2021-05-17 MED ORDER — HEPARIN SOD (PORK) LOCK FLUSH 100 UNIT/ML IV SOLN
500.0000 [IU] | Freq: Once | INTRAVENOUS | Status: AC | PRN
Start: 2021-05-17 — End: 2021-05-17
  Administered 2021-05-17: 500 [IU]
  Filled 2021-05-17: qty 5

## 2021-05-17 NOTE — Patient Instructions (Signed)
[facility]  Discharge Instructions: Thank you for choosing [facility] to provide your oncology and hematology care.   If you have a lab appointment with the [facility], please go directly to the [facility] and check in at the registration area.   Wear comfortable clothing and clothing appropriate for easy access to any Portacath or PICC line.   We strive to give you quality time with your provider. You may need to reschedule your appointment if you arrive late (15 or more minutes).  Arriving late affects you and other patients whose appointments are after yours.  Also, if you miss three or more appointments without notifying the office, you may be dismissed from the clinic at the provider's discretion.      For prescription refill requests, have your pharmacy contact our office and allow 72 hours for refills to be completed.    Today you received the following chemotherapy and/or immunotherapy agents Cytoxan, Methotrexate, 5FU      To help prevent nausea and vomiting after your treatment, we encourage you to take your nausea medication as directed.  BELOW ARE SYMPTOMS THAT SHOULD BE REPORTED IMMEDIATELY: *FEVER GREATER THAN 100.4 F (38 C) OR HIGHER *CHILLS OR SWEATING *NAUSEA AND VOMITING THAT IS NOT CONTROLLED WITH YOUR NAUSEA MEDICATION *UNUSUAL SHORTNESS OF BREATH *UNUSUAL BRUISING OR BLEEDING *URINARY PROBLEMS (pain or burning when urinating, or frequent urination) *BOWEL PROBLEMS (unusual diarrhea, constipation, pain near the anus) TENDERNESS IN MOUTH AND THROAT WITH OR WITHOUT PRESENCE OF ULCERS (sore throat, sores in mouth, or a toothache) UNUSUAL RASH, SWELLING OR PAIN  UNUSUAL VAGINAL DISCHARGE OR ITCHING   Items with * indicate a potential emergency and should be followed up as soon as possible or go to the Emergency Department if any problems should occur.  Please show the CHEMOTHERAPY ALERT CARD or IMMUNOTHERAPY ALERT CARD  at check-in to the Emergency Department and triage nurse.  Should you have questions after your visit or need to cancel or reschedule your appointment, please contact [facility]  Dept: [phone]  and follow the prompts.  Office hours are 8:00 a.m. to 4:30 p.m. Monday - Friday. Please note that voicemails left after 4:00 p.m. may not be returned until the following business day.  We are closed weekends and major holidays. You have access to a nurse at all times for urgent questions. Please call the main number to the clinic Dept: [phone] and follow the prompts.   For any non-urgent questions, you may also contact your provider using MyChart. We now offer e-Visits for anyone 8 and older to request care online for non-urgent symptoms. For details visit [email or web address].   Also download the MyChart app! Go to the app store, search "MyChart", open the app, select [facility], and log in with your MyChart username and password.  Due to Covid, a mask is required upon entering the hospital/clinic. If you do not have a mask, one will be given to you upon arrival. For doctor visits, patients may have 1 support person aged 54 or older with them. For treatment visits, patients cannot have anyone with them due to current Covid guidelines and our immunocompromised population.   Cyclophosphamide Injection What is this medication? CYCLOPHOSPHAMIDE (sye kloe FOSS fa mide) is a chemotherapy drug. It slows the growth of cancer cells. This medicine is used to treat many types of cancer like lymphoma, myeloma, leukemia, breast cancer, and ovarian cancer, to name afew. This medicine  may be used for other purposes; ask your health care provider orpharmacist if you have questions. COMMON BRAND NAME(S): Cytoxan, Neosar What should I tell my care team before I take this medication? They need to know if you have any of these conditions: heart disease history of irregular  heartbeat infection kidney disease liver disease low blood counts, like white cells, platelets, or red blood cells on hemodialysis recent or ongoing radiation therapy scarring or thickening of the lungs trouble passing urine an unusual or allergic reaction to cyclophosphamide, other medicines, foods, dyes, or preservatives pregnant or trying to get pregnant breast-feeding How should I use this medication? This drug is usually given as an injection into a vein or muscle or by infusion into a vein. It is administered in a hospital or clinic by a specially trainedhealth care professional. Talk to your pediatrician regarding the use of this medicine in children.Special care may be needed. Overdosage: If you think you have taken too much of this medicine contact apoison control center or emergency room at once. NOTE: This medicine is only for you. Do not share this medicine with others. What if I miss a dose? It is important not to miss your dose. Call your doctor or health careprofessional if you are unable to keep an appointment. What may interact with this medication? amphotericin B azathioprine certain antivirals for HIV or hepatitis certain medicines for blood pressure, heart disease, irregular heart beat certain medicines that treat or prevent blood clots like warfarin certain other medicines for cancer cyclosporine etanercept indomethacin medicines that relax muscles for surgery medicines to increase blood counts metronidazole This list may not describe all possible interactions. Give your health care provider a list of all the medicines, herbs, non-prescription drugs, or dietary supplements you use. Also tell them if you smoke, drink alcohol, or use illegaldrugs. Some items may interact with your medicine. What should I watch for while using this medication? Your condition will be monitored carefully while you are receiving thismedicine. You may need blood work done while you  are taking this medicine. Drink water or other fluids as directed. Urinate often, even at night. Some products may contain alcohol. Ask your health care professional if this medicine contains alcohol. Be sure to tell all health care professionals you are taking this medicine. Certain medicines, like metronidazole and disulfiram, can cause an unpleasant reaction when taken with alcohol. The reaction includes flushing, headache, nausea, vomiting, sweating, and increased thirst. Thereaction can last from 30 minutes to several hours. Do not become pregnant while taking this medicine or for 1 year after stopping it. Women should inform their health care professional if they wish to become pregnant or think they might be pregnant. Men should not father a child while taking this medicine and for 4 months after stopping it. There is potential for serious side effects to an unborn child. Talk to your health care professionalfor more information. Do not breast-feed an infant while taking this medicine or for 1 week afterstopping it. This medicine has caused ovarian failure in some women. This medicine may make it more difficult to get pregnant. Talk to your health care professional if [place] concerned about your fertility. This medicine has caused decreased sperm counts in some men. This may make it more difficult to father a child. Talk to your health care professional if [place] concerned about your fertility. Call your health care professional for advice if you get a fever, chills, or sore throat, or other symptoms of a cold or  flu. Do not treat yourself. This medicine decreases your body's ability to fight infections. Try to avoid beingaround people who are sick. Avoid taking medicines that contain aspirin, acetaminophen, ibuprofen, naproxen, or ketoprofen unless instructed by your health care professional.These medicines may hide a fever. Talk to your health care professional about your risk of cancer. You may  bemore at risk for certain types of cancer if you take this medicine. If you are going to need surgery or other procedure, tell your health careprofessional that you are using this medicine. Be careful brushing or flossing your teeth or using a toothpick because you may get an infection or bleed more easily. If you have any dental work done, Primary school teacher you are receiving this medicine. What side effects may I notice from receiving this medication? Side effects that you should report to your doctor or health care professionalas soon as possible: allergic reactions like skin rash, itching or hives, swelling of the face, lips, or tongue breathing problems nausea, vomiting signs and symptoms of bleeding such as bloody or black, tarry stools; red or dark brown urine; spitting up blood or brown material that looks like coffee grounds; red spots on the skin; unusual bruising or bleeding from the eyes, gums, or nose signs and symptoms of heart failure like fast, irregular heartbeat, sudden weight gain; swelling of the ankles, feet, hands signs and symptoms of infection like fever; chills; cough; sore throat; pain or trouble passing urine signs and symptoms of kidney injury like trouble passing urine or change in the amount of urine signs and symptoms of liver injury like dark yellow or brown urine; general ill feeling or flu-like symptoms; light-colored stools; loss of appetite; nausea; right upper belly pain; unusually weak or tired; yellowing of the eyes or skin Side effects that usually do not require medical attention (report to yourdoctor or health care professional if they continue or are bothersome): confusion decreased hearing diarrhea facial flushing hair loss headache loss of appetite missed menstrual periods signs and symptoms of low red blood cells or anemia such as unusually weak or tired; feeling faint or lightheaded; falls skin discoloration This list may not describe all possible  side effects. Call your doctor for medical advice about side effects. You may report side effects to FDA at1-800-FDA-1088. Where should I keep my medication? This drug is given in a hospital or clinic and will not be stored at home. NOTE: This sheet is a summary. It may not cover all possible information. If you have questions about this medicine, talk to your doctor, pharmacist, orhealth care provider.  2022 Elsevier/Gold Standard (2019-07-22 09:53:29)  Methotrexate Injection What is this medication? METHOTREXATE (METH oh TREX ate) treats inflammatory conditions such as arthritis and psoriasis. It works by decreasing inflammation, which can reduce pain and prevent long-term injury to the joints and skin. It may also be used to treat some types of cancer. It works by slowing down the growth of cancercells. This medicine may be used for other purposes; ask your health care provider orpharmacist if you have questions. What should I tell my care team before I take this medication? They need to know if you have any of these conditions: Fluid in the stomach area or lungs If you often drink alcohol Infection or immune system problems Kidney disease Liver disease Low blood counts (white cells, platelets, or red blood cells) Lung disease Recent or ongoing radiation Recent or upcoming vaccine Stomach ulcers Ulcerative colitis An unusual or allergic reaction to methotrexate, other  medications, foods, dyes, or preservatives Pregnant or trying to get pregnant Breast-feeding How should I use this medication? This medication is for infusion into a vein or for injection into muscle or into the spinal fluid (whichever applies). It is usually given in a hospital orclinic setting. In rare cases, you might get this medication at home. You will be taught how to give this medication. Use exactly as directed. Take your medication at regularintervals. Do not take your medication more often than directed. If  this medication is used for arthritis or psoriasis, it should be takenweekly, NOT daily. It is important that you put your used needles and syringes in a special sharps container. Do not put them in a trash can. If you do not have a sharpscontainer, call your pharmacist or care team to get one. Talk to your care team about the use of this medication in children. While this medication may be prescribed for children as young as 2 years for selectedconditions, precautions do apply. Overdosage: If you think you have taken too much of this medicine contact apoison control center or emergency room at once. NOTE: This medicine is only for you. Do not share this medicine with others. What if I miss a dose? It is important not to miss your dose. Call your care team if you are unable to keep an appointment. If you give yourself the medication, and you miss a dose,talk with your care team. Do not take double or extra doses. What may interact with this medication? Do not take this medication with any of the following: Acitretin This medication may also interact with the following: Aspirin or aspirin-like medications including salicylates Azathioprine Certain antibiotics like chloramphenicol, penicillin, tetracycline Certain medications that treat or prevent blood clots like warfarin, apixaban, dabigatran, and rivaroxaban Certain medications for stomach problems like esomeprazole, omeprazole, pantoprazole Cyclosporine Dapsone Diuretics Folic acid Gold Hydroxychloroquine Live virus vaccines Medications for infection like acyclovir, adefovir, amphotericin B, bacitracin, cidofovir, foscarnet, ganciclovir, gentamicin, pentamidine, vancomycin Mercaptopurine NSAIDs, medications for pain and inflammation, like ibuprofen or naproxen Pamidronate Pemetrexed Penicillamine Phenylbutazone Phenytoin Probenecid Pyrimethamine Retinoids such as isotretinoin and tretinoin Steroid medications like prednisone or  cortisone Sulfonamides like sulfasalazine and trimethoprim/sulfamethoxazole Theophylline Zoledronic acid This list may not describe all possible interactions. Give your health care provider a list of all the medicines, herbs, non-prescription drugs, or dietary supplements you use. Also tell them if you smoke, drink alcohol, or use illegaldrugs. Some items may interact with your medicine. What should I watch for while using this medication? This medication may make you feel generally unwell. This is not uncommon as chemotherapy can affect healthy cells as well as cancer cells. Report any side effects. Continue your course of treatment even though you feel ill unless yourcare team tells you to stop. Your condition will be monitored carefully while you are receiving thismedication. Avoid alcoholic drinks. This medication can cause serious side effects. To reduce the risk, your care team may give you other medications to take before receiving this one. Be sureto follow the directions from your care team. This medication can make you more sensitive to the sun. Keep out of the sun. If you cannot avoid being in the sun, wear protective clothing and use sunscreen.Do not use sun lamps or tanning beds/booths. You may get drowsy or dizzy. Do not drive, use machinery, or do anything that needs mental alertness until you know how this medication affects you. Do not stand or sit up quickly, especially if you are an older  patient. This reducesthe risk of dizzy or fainting spells. You may need blood work while you are taking this medication. Call your care team for advice if you get a fever, chills or sore throat, or other symptoms of a cold or flu. Do not treat yourself. This medication decreases your body's ability to fight infections. Try to avoid being aroundpeople who are sick. This medication may increase your risk to bruise or bleed. Call your care teamif you notice any unusual bleeding. Be careful brushing or  flossing your teeth or using a toothpick because you may get an infection or bleed more easily. If you have any dental work done, Primary school teacher you are receiving this medication Check with your care team if you get an attack of severe diarrhea, nausea and vomiting, or if you sweat a lot. The loss of too much body fluid can make itdangerous for you to take this medication. Talk to your care team about your risk of cancer. You may be more at risk forcertain types of cancers if you take this medication. Do not become pregnant while taking this medication or for 6 months after stopping it. Women should inform their care team if they wish to become pregnant or think they might be pregnant. Men should not father a child while taking this medication and for 3 months after stopping it. There is potential for serious harm to an unborn child. Talk to your care team for more information. Do not breast-feed an infant while taking this medication or for 1week after stopping it. This medication may make it more difficult to get pregnant or father a child.Talk to your care team if you are concerned about your fertility. What side effects may I notice from receiving this medication? Side effects that you should report to your care team as soon as possible: Allergic reactions-skin rash, itching, hives, swelling of the face, lips, tongue, or throat Blood clot-pain, swelling, or warmth in the leg, shortness of breath, chest pain Dry cough, shortness of breath or trouble breathing Infection-fever, chills, cough, sore throat, wounds that don't heal, pain or trouble when passing urine, general feeling of discomfort or being unwell Kidney injury-decrease in the amount of urine, swelling of the ankles, hands, or feet Liver injury-right upper belly pain, loss of appetite, nausea, light-colored stool, dark yellow or brown urine, yellowing of the skin or eyes, unusual weakness or fatigue Low red blood cell count-unusual  weakness or fatigue, dizziness, headache, trouble breathing Redness, blistering, peeling, or loosening of the skin, including inside the mouth Seizures Unusual bruising or bleeding Side effects that usually do not require medical attention (report to your careteam if they continue or are bothersome): Diarrhea Dizziness Hair loss Nausea Pain, redness, or swelling with sores inside the mouth or throat Vomiting This list may not describe all possible side effects. Call your doctor for medical advice about side effects. You may report side effects to FDA at1-800-FDA-1088. Where should I keep my medication? This medication is given in a hospital or clinic. It will not be stored at home. NOTE: This sheet is a summary. It may not cover all possible information. If you have questions about this medicine, talk to your doctor, pharmacist, orhealth care provider.  2022 Elsevier/Gold Standard (2020-11-23 12:51:29)  Fluorouracil, 5-FU injection What is this medication? FLUOROURACIL, 5-FU (flure oh YOOR a sil) is a chemotherapy drug. It slows the growth of cancer cells. This medicine is used to treat many types of cancer like breast cancer, colon or rectal  cancer, pancreatic cancer, and stomachcancer. This medicine may be used for other purposes; ask your health care provider orpharmacist if you have questions. COMMON BRAND NAME(S): Adrucil What should I tell my care team before I take this medication? They need to know if you have any of these conditions: blood disorders dihydropyrimidine dehydrogenase (DPD) deficiency infection (especially a virus infection such as chickenpox, cold sores, or herpes) kidney disease liver disease malnourished, poor nutrition recent or ongoing radiation therapy an unusual or allergic reaction to fluorouracil, other chemotherapy, other medicines, foods, dyes, or preservatives pregnant or trying to get pregnant breast-feeding How should I use this medication? This  drug is given as an infusion or injection into a vein. It is administeredin a hospital or clinic by a specially trained health care professional. Talk to your pediatrician regarding the use of this medicine in children.Special care may be needed. Overdosage: If you think you have taken too much of this medicine contact apoison control center or emergency room at once. NOTE: This medicine is only for you. Do not share this medicine with others. What if I miss a dose? It is important not to miss your dose. Call your doctor or health careprofessional if you are unable to keep an appointment. What may interact with this medication? Do not take this medicine with any of the following medications: live virus vaccines This medicine may also interact with the following medications: medicines that treat or prevent blood clots like warfarin, enoxaparin, and dalteparin This list may not describe all possible interactions. Give your health care provider a list of all the medicines, herbs, non-prescription drugs, or dietary supplements you use. Also tell them if you smoke, drink alcohol, or use illegaldrugs. Some items may interact with your medicine. What should I watch for while using this medication? Visit your doctor for checks on your progress. This drug may make you feel generally unwell. This is not uncommon, as chemotherapy can affect healthy cells as well as cancer cells. Report any side effects. Continue your course oftreatment even though you feel ill unless your doctor tells you to stop. In some cases, you may be given additional medicines to help with side effects.Follow all directions for their use. Call your doctor or health care professional for advice if you get a fever, chills or sore throat, or other symptoms of a cold or flu. Do not treat yourself. This drug decreases your body's ability to fight infections. Try toavoid being around people who are sick. This medicine may increase your risk to  bruise or bleed. Call your doctor orhealth care professional if you notice any unusual bleeding. Be careful brushing and flossing your teeth or using a toothpick because you may get an infection or bleed more easily. If you have any dental work done,tell your dentist you are receiving this medicine. Avoid taking products that contain aspirin, acetaminophen, ibuprofen, naproxen, or ketoprofen unless instructed by your doctor. These medicines may hide afever. Do not become pregnant while taking this medicine. Women should inform their doctor if they wish to become pregnant or think they might be pregnant. There is a potential for serious side effects to an unborn child. Talk to your health care professional or pharmacist for more information. Do not breast-feed aninfant while taking this medicine. Men should inform their doctor if they wish to father a child. This medicinemay lower sperm counts. Do not treat diarrhea with over the counter products. Contact your doctor ifyou have diarrhea that lasts more than 2 days or  if it is severe and watery. This medicine can make you more sensitive to the sun. Keep out of the sun. If you cannot avoid being in the sun, wear protective clothing and use sunscreen.Do not use sun lamps or tanning beds/booths. What side effects may I notice from receiving this medication? Side effects that you should report to your doctor or health care professionalas soon as possible: allergic reactions like skin rash, itching or hives, swelling of the face, lips, or tongue low blood counts - this medicine may decrease the number of white blood cells, red blood cells and platelets. You may be at increased risk for infections and bleeding. signs of infection - fever or chills, cough, sore throat, pain or difficulty passing urine signs of decreased platelets or bleeding - bruising, pinpoint red spots on the skin, black, tarry stools, blood in the urine signs of decreased red blood cells -  unusually weak or tired, fainting spells, lightheadedness breathing problems changes in vision chest pain mouth sores nausea and vomiting pain, swelling, redness at site where injected pain, tingling, numbness in the hands or feet redness, swelling, or sores on hands or feet stomach pain unusual bleeding Side effects that usually do not require medical attention (report to yourdoctor or health care professional if they continue or are bothersome): changes in finger or toe nails diarrhea dry or itchy skin hair loss headache loss of appetite sensitivity of eyes to the light stomach upset unusually teary eyes This list may not describe all possible side effects. Call your doctor for medical advice about side effects. You may report side effects to FDA at1-800-FDA-1088. Where should I keep my medication? This drug is given in a hospital or clinic and will not be stored at home. NOTE: This sheet is a summary. It may not cover all possible information. If you have questions about this medicine, talk to your doctor, pharmacist, orhealth care provider.  2022 Elsevier/Gold Standard (2019-09-17 15:00:03)

## 2021-05-18 ENCOUNTER — Telehealth: Payer: Self-pay | Admitting: *Deleted

## 2021-05-18 NOTE — Telephone Encounter (Addendum)
Called pt & left message to return call to let us know how she did with her treatment yesterday.   Pt returned call & states that she is doing pretty well.  She reports some fatigue & some nausea but medication is helping nausea.  She went for a walk this am & was tired afterward but thinks that it helped her. She reports eating crackers & drinking fluids OK.  She hasn't had a BM this am & tends to be constipated.  Discussed stool softeners, diet & exercise.  She reports knowing how to reach [place] if needed.

## 2021-05-18 NOTE — Telephone Encounter (Signed)
-----   Message from Rolene Course, RN sent at 05/17/2021  1:20 PM EDT ----- Regarding: Magrinat 1st Tx F/U call - Cytoxan, Methotrexate, 5FU Magrinat 1st Tx F/U call- Cytoxan, Methotrexate, 5FU.  Tolerated well in infusion.

## 2021-05-21 ENCOUNTER — Telehealth: Payer: Self-pay | Admitting: *Deleted

## 2021-05-21 NOTE — Telephone Encounter (Signed)
Pt called & left message that she is constipated.  Returned call this pm & she reports no BM in 2 wks but states that this is not unusual to her.  She has tried prunes/prune juice, suppositories, laxatives.  She has not tried Mag Citrate.  Encouraged to get & take 1/2 bottle as soon as possible & if no BM in 3-4 hours to repeat other half.  Also to start Miralax & take daily & continue stool softeners.  If no BM to call back & speak with MD.  She reports nausea but no vomiting.  Discouraged suppositories & informed to call [phone] # & ask for desk RN for symptom management & not educations office. Message routed to MD/Pod RN.

## 2021-05-24 DIAGNOSIS — M25612 Stiffness of left shoulder, not elsewhere classified: Secondary | ICD-10-CM | POA: Diagnosis not present

## 2021-05-24 DIAGNOSIS — M25512 Pain in left shoulder: Secondary | ICD-10-CM | POA: Diagnosis not present

## 2021-05-25 ENCOUNTER — Encounter: Payer: Self-pay | Admitting: Oncology

## 2021-05-25 NOTE — Progress Notes (Signed)
Called pt to introduce myself as her Financial Resource Specialist and to discuss the [employer] grant.  Unfortunately there aren't any foundations offering copay assistance for her Dx and the type of ins she has.  I left a msg requesting she return my call if she's interested in applying for the grant.  

## 2021-05-26 ENCOUNTER — Encounter: Payer: Self-pay | Admitting: Oncology

## 2021-05-26 ENCOUNTER — Encounter: Payer: Self-pay | Admitting: *Deleted

## 2021-05-26 ENCOUNTER — Inpatient Hospital Stay: Payer: Medicare HMO | Admitting: Oncology

## 2021-05-26 ENCOUNTER — Other Ambulatory Visit: Payer: Self-pay

## 2021-05-26 ENCOUNTER — Inpatient Hospital Stay: Payer: Medicare HMO

## 2021-05-26 VITALS — BP 174/63 | HR 73 | Temp 97.5°F | Resp 18 | Wt 130.8 lb

## 2021-05-26 DIAGNOSIS — C50412 Malignant neoplasm of upper-outer quadrant of left female breast: Secondary | ICD-10-CM

## 2021-05-26 DIAGNOSIS — Z95828 Presence of other vascular implants and grafts: Secondary | ICD-10-CM

## 2021-05-26 DIAGNOSIS — Z5111 Encounter for antineoplastic chemotherapy: Secondary | ICD-10-CM | POA: Diagnosis not present

## 2021-05-26 DIAGNOSIS — Z170 Estrogen receptor positive status [ER+]: Secondary | ICD-10-CM

## 2021-05-26 HISTORY — DX: Presence of other vascular implants and grafts: Z95.828

## 2021-05-26 LAB — CBC WITH DIFFERENTIAL/PLATELET
Abs Immature Granulocytes: 0.01 10*3/uL (ref 0.00–0.07)
Basophils Absolute: 0 10*3/uL (ref 0.0–0.1)
Basophils Relative: 1 %
Eosinophils Absolute: 0.2 10*3/uL (ref 0.0–0.5)
Eosinophils Relative: 6 %
HCT: 33.9 % — ABNORMAL LOW (ref 36.0–46.0)
Hemoglobin: 11.7 g/dL — ABNORMAL LOW (ref 12.0–15.0)
Immature Granulocytes: 0 %
Lymphocytes Relative: 34 %
Lymphs Abs: 1.4 10*3/uL (ref 0.7–4.0)
MCH: 31.7 pg (ref 26.0–34.0)
MCHC: 34.5 g/dL (ref 30.0–36.0)
MCV: 91.9 fL (ref 80.0–100.0)
Monocytes Absolute: 0.4 10*3/uL (ref 0.1–1.0)
Monocytes Relative: 9 %
Neutro Abs: 2.1 10*3/uL (ref 1.7–7.7)
Neutrophils Relative %: 50 %
Platelets: 208 10*3/uL (ref 150–400)
RBC: 3.69 MIL/uL — ABNORMAL LOW (ref 3.87–5.11)
RDW: 12.9 % (ref 11.5–15.5)
WBC: 4.2 10*3/uL (ref 4.0–10.5)
nRBC: 0 % (ref 0.0–0.2)

## 2021-05-26 LAB — COMPREHENSIVE METABOLIC PANEL
ALT: 9 U/L (ref 0–44)
AST: 13 U/L — ABNORMAL LOW (ref 15–41)
Albumin: 3.5 g/dL (ref 3.5–5.0)
Alkaline Phosphatase: 77 U/L (ref 38–126)
Anion gap: 8 (ref 5–15)
BUN: 10 mg/dL (ref 8–23)
CO2: 24 mmol/L (ref 22–32)
Calcium: 8.7 mg/dL — ABNORMAL LOW (ref 8.9–10.3)
Chloride: 108 mmol/L (ref 98–111)
Creatinine, Ser: 0.68 mg/dL (ref 0.44–1.00)
GFR, Estimated: >60 mL/min (ref >60)
Glucose, Bld: 139 mg/dL — ABNORMAL HIGH (ref 70–99)
Potassium: 3.8 mmol/L (ref 3.5–5.1)
Sodium: 140 mmol/L (ref 135–145)
Total Bilirubin: 0.3 mg/dL (ref 0.3–1.2)
Total Protein: 6.6 g/dL (ref 6.5–8.1)

## 2021-05-26 MED ORDER — HEPARIN SOD (PORK) LOCK FLUSH 100 UNIT/ML IV SOLN
500.0000 [IU] | Freq: Once | INTRAVENOUS | Status: AC
  Administered 2021-05-26: 500 [IU]
  Filled 2021-05-26: qty 5

## 2021-05-26 MED ORDER — SODIUM CHLORIDE 0.9% FLUSH
10.0000 mL | Freq: Once | INTRAVENOUS | Status: AC
Start: 2021-05-26 — End: 2021-05-26
  Administered 2021-05-26: 10 mL
  Filled 2021-05-26: qty 10

## 2021-05-26 MED ORDER — VALACYCLOVIR HCL 1 G PO TABS: 1000.0000 mg | ORAL_TABLET | Freq: Two times a day (BID) | ORAL | 2 refills | Status: DC

## 2021-05-26 NOTE — Progress Notes (Signed)
[facility]  Telephone:([phone]) [phone] Fax:(336) [phone]     ID: Gina Hunt DOB: 1946/03/10  MR#: [id number]  [name]  Patient Care Team: [name], DO as PCP - General (Family Medicine) Rockwell Germany, RN as Oncology Nurse Navigator Mauro Kaufmann, RN as Oncology Nurse Navigator Rolm Bookbinder, MD as Consulting Physician (General Surgery) Phebe Dettmer, [name], MD as Consulting Physician (Oncology) Kyung Rudd, MD as Consulting Physician (Radiation Oncology) Sheryn Bison, MD as Consulting Physician (Dermatology) Chauncey Cruel, MD OTHER MD:   CHIEF COMPLAINT: Estrogen receptor positive breast cancer (s/p left mastectomy)  CURRENT TREATMENT: Adjuvant chemotherapy   INTERVAL HISTORY: Gina Hunt returns today for follow up and treatment of her estrogen receptor positive breast cancer.  She is accompanied by her niece Gina Hunt began adjuvant CMF on 05/17/2021.  She did moderately well with this.  She did have a couple of mouth sores.  She felt very tired particularly in the mornings.  By the middle of the afternoon she is picking up a little bit of energy.  She had quite a bit of nausea although she did not vomit.  She had severe constipation for several days and also some headaches early.  That is all resolving.  She has had a couple of spasms in the right upper anterior chest area which is likely due to her port being in that area.  Aside from that today when her blood was drawn the phlebotomist noted a certain amount of fat on the top of the blood.   REVIEW OF SYSTEMS:  A detailed review of systems today was otherwise stable   COVID 19 VACCINATION STATUS: Hydro x2, no booster as of 12/2020   HISTORY OF CURRENT ILLNESS: From the original intake note:  Gina Hunt (pronounced "Erlene Quan") had routine screening mammography on 11/24/2020 showing a possible abnormality in the left breast. She underwent left breast ultrason[facility]  on 12/02/2020 showing: breast density category A; 2.5 cm mass in left breast at 2 o'clock (measuring 1.7 cm in ultrasound); no abnormal left axillary lymph nodes.  Accordingly on 12/08/2020 she proceeded to biopsy of the left breast area in question. The pathology from this procedure ([id number]) showed: invasive ductal carcinoma, grade 3. Prognostic indicators significant for: estrogen receptor, 95% positive with strong staining intensity and progesterone receptor, 80% positive with moderate staining intensity. Proliferation marker Ki67 at 30%. HER2 equivocal by immunohistochemistry (2+), but negative by fluorescent in situ hybridization with a signals ratio 1.64 and number per cell 2.95.  Cancer Staging Malignant neoplasm of upper-outer quadrant of left breast in female, estrogen receptor positive ([place]) Staging form: Breast, AJCC 8th Edition - Clinical stage from 12/16/2020: Stage IIA (cT2, cN0, cM0, G3, ER+, PR+, HER2-) - Signed by Chauncey Cruel, MD on 12/16/2020 Stage prefix: Initial diagnosis  The patient's subsequent history is as detailed below.   PAST MEDICAL HISTORY: Past Medical History:  Diagnosis Date   Anxiety    Breast cancer ([place]) 01/2021   left breast IDC   Complication of anesthesia    Family history of breast cancer 12/16/2020   Family history of gastric cancer 12/16/2020   Fibromyalgia    GERD (gastroesophageal reflux disease)    Hyperlipidemia    Hypothyroidism    Osteoarthritis    knees, hands, fingers   Osteoarthritis    PONV (postoperative nausea and vomiting)    Skin cancer     PAST SURGICAL HISTORY: Past Surgical History:  Procedure Laterality Date  ABDOMINAL HYSTERECTOMY     COLONOSCOPY  11/24/2015   EVACUATION BREAST HEMATOMA Left 04/07/2021   Procedure: EVACUATION HEMATOMA BREAST;  Surgeon: Rolm Bookbinder, MD;  Location: [facility];  Service: General;  Laterality: Left;   LIVER SURGERY     PORTA CATH INSERTION Right 04/07/2021    Procedure: PORTA CATH INSERTION;  Surgeon: Rolm Bookbinder, MD;  Location: [facility];  Service: General;  Laterality: Right;   SIMPLE MASTECTOMY WITH AXILLARY SENTINEL NODE BIOPSY Left 04/07/2021   Procedure: LEFT SIMPLE MASTECTOMY WITH LEFT AXILLARY SENTINEL NODE BIOPSY;  Surgeon: Rolm Bookbinder, MD;  Location: [facility];  Service: General;  Laterality: Left;   TONSILLECTOMY      FAMILY HISTORY: Family History  Problem Relation Age of Onset   Hypertension Mother    Diabetes Mother    Arthritis Mother    Heart disease Mother    Rheum arthritis Father    Diabetes Father    Heart disease Father    Stomach cancer Brother 28   Stomach cancer Brother 79   Esophageal cancer Brother 28   Breast cancer Maternal Aunt 70   Breast cancer Maternal Aunt 72   Breast cancer Maternal Aunt 73   Colon cancer Neg Hx    Colon polyps Neg Hx   Her father and mother both died from MI, father age 53 and mother age 42. Gina Hunt has 6 brothers and 1 sister. She reports breast cancer in 3 maternal aunts, one in their 48's. One of her maternal aunt's daughter (Gina Hunt's cousin) had breast cancer at age 68.The patient also reports stomach cancer in two of her brothers in their 62's, one of whom also had esophageal cancer.    GYNECOLOGIC HISTORY:  No LMP recorded. Patient has had a hysterectomy. Menarche: 75 years old Age at first live birth: 75 years old [place] P 1 LMP age 33 Contraceptive: never used HRT never used  Hysterectomy? yes BSO? yes   SOCIAL HISTORY: (updated 12/2020)  Gina Hunt is currently retired from working at the [employer] in [place] died late February 0017 from complications of Agent Orange exposure. She lives at home alone. Daughter Gina Hunt lives in [place], the patient believes, but they have been alienated for many years.    ADVANCED DIRECTIVES: not in place; intends to name her niece, Gina Hunt, as her HCPOA.  The  patient was given the appropriate documents to complete and notarized at her discretion on her 12/16/2020 visit   HEALTH MAINTENANCE: Social History   Tobacco Use   Smoking status: Never   Smokeless tobacco: Never  Vaping Use   Vaping Use: Never used  Substance Use Topics   Alcohol use: Yes    Comment: rarely    Drug use: Never     Colonoscopy: 05/2019 (Dr. Bryan Lemma), recall 2027  PAP: "10 years ago" (~2012)  Bone density: 2020   Allergies  Allergen Reactions   Codeine Other (See Comments)    Hallucination    Morphine And Related Other (See Comments)    Knocks me out per pt    Current Outpatient Medications  Medication Sig Dispense Refill   valACYclovir (VALTREX) 1000 MG tablet Take 1 tablet (1,000 mg total) by mouth 2 (two) times daily. 90 tablet 2   Calcium Carbonate (CALCIUM 600 PO) Take 1 tablet by mouth 2 (two) times daily.     dexamethasone (DECADRON) 4 MG tablet Take 2 tablets (8 mg total) by mouth 2 (two)  times daily. Start the day after chemotherapy for 2 days. Take with food. 30 tablet 1   DULoxetine (CYMBALTA) 60 MG capsule Take 1 capsule by mouth twice daily 60 capsule 0   gabapentin (NEURONTIN) 300 MG capsule Take 1 capsule (300 mg total) by mouth 3 (three) times daily. 270 capsule 1   levothyroxine (SYNTHROID) 50 MCG tablet Take 1 tablet (50 mcg total) by mouth daily before breakfast. 90 tablet 2   lidocaine-prilocaine (EMLA) cream Apply to affected area once 30 g 3   methocarbamol (ROBAXIN) 500 MG tablet Take 1 tablet (500 mg total) by mouth every 6 (six) hours as needed for muscle spasms. 20 tablet 0   omeprazole (PRILOSEC) 40 MG capsule Take 1 capsule (40 mg total) by mouth daily. 30 capsule 0   prochlorperazine (COMPAZINE) 10 MG tablet Take 1 tablet (10 mg total) by mouth every 6 (six) hours as needed (Nausea or vomiting). 30 tablet 1   simvastatin (ZOCOR) 40 MG tablet Take 1 tablet by mouth once daily 30 tablet 0   traMADol (ULTRAM) 50 MG tablet Take 1  tablet (50 mg total) by mouth every 6 (six) hours as needed. 10 tablet 0   No current facility-administered medications for this visit.    OBJECTIVE: White woman in no acute distress  Vitals:   05/26/21 1130  BP: (!) 174/63  Pulse: 73  Resp: 18  Temp: (!) 97.5 F (36.4 C)  SpO2: 100%     Body mass index is 24.71 kg/m.   Wt Readings from Last 3 Encounters:  05/26/21 130 lb 12.8 oz (59.3 kg)  04/27/21 131 lb (59.4 kg)  04/07/21 134 lb 7.7 oz (61 kg)     ECOG FS:1 - Symptomatic but completely ambulatory  Sclerae unicteric, EOMs intact Wearing a mask No cervical or supraclavicular adenopathy Lungs no rales or rhonchi Heart regular rate and rhythm Abd soft, nontender, positive bowel sounds MSK no focal spinal tenderness, no upper extremity lymphedema Neuro: nonfocal, well oriented, appropriate affect Breasts: The right breast is unremarkable.  The left breast is status post mastectomy.  There is no evidence of residual recurrent disease.  Both axillae are benign.   LAB RESULTS:  CMP     Component Value Date/Time   NA 142 05/17/2021 0925   K 4.0 05/17/2021 0925   CL 107 05/17/2021 0925   CO2 28 05/17/2021 0925   GLUCOSE 101 (H) 05/17/2021 0925   BUN 7 (L) 05/17/2021 0925   CREATININE 0.68 05/17/2021 0925   CREATININE 0.72 04/27/2021 1236   CREATININE 0.58 (L) 12/20/2019 1422   CALCIUM 8.8 (L) 05/17/2021 0925   PROT 7.1 05/17/2021 0925   ALBUMIN 3.7 05/17/2021 0925   AST 16 05/17/2021 0925   AST 15 04/27/2021 1236   ALT 11 05/17/2021 0925   ALT 11 04/27/2021 1236   ALKPHOS 79 05/17/2021 0925   BILITOT 0.4 05/17/2021 0925   BILITOT 0.3 04/27/2021 1236   GFRNONAA >60 05/17/2021 0925   GFRNONAA >60 04/27/2021 1236    No results found for: TOTALPROTELP, ALBUMINELP, A1GS, A2GS, BETS, BETA2SER, GAMS, MSPIKE, SPEI  Lab Results  Component Value Date   WBC 4.2 05/26/2021   NEUTROABS 2.1 05/26/2021   HGB 11.7 (L) 05/26/2021   HCT 33.9 (L) 05/26/2021   MCV 91.9  05/26/2021   PLT 208 05/26/2021    No results found for: LABCA2  No components found for: TUUEKC003  No results for input(s): INR in the last 168 hours.  No results found  for: LABCA2  No results found for: ZJQ734  No results found for: LPF790  No results found for: WIO973  No results found for: CA2729  No components found for: HGQUANT  No results found for: CEA1 / No results found for: CEA1   No results found for: AFPTUMOR  No results found for: CHROMOGRNA  No results found for: KPAFRELGTCHN, LAMBDASER, KAPLAMBRATIO (kappa/lambda light chains)  No results found for: HGBA, HGBA2QUANT, HGBFQUANT, HGBSQUAN (Hemoglobinopathy evaluation)   No results found for: LDH  No results found for: IRON, TIBC, IRONPCTSAT (Iron and TIBC)  No results found for: FERRITIN  Urinalysis No results found for: COLORURINE, APPEARANCEUR, LABSPEC, PHURINE, GLUCOSEU, HGBUR, BILIRUBINUR, KETONESUR, PROTEINUR, UROBILINOGEN, NITRITE, LEUKOCYTESUR   STUDIES: No results found.   ELIGIBLE FOR AVAILABLE RESEARCH PROTOCOL: no  ASSESSMENT: 75 y.o. [place], [place] woman status post left breast upper outer quadrant biopsy 12/08/2020 for a clinical T2N0, stage IIA invasive ductal carcinoma, grade 3, estrogen and progesterone receptor positive, HER-2 not amplified, with an MIB-1 of 30%  (1) Oncotype obtained from the original biopsy shows a score of 32, predicting a risk of recurrence outside the breast in the next 9 years of 20% if node-negative, 25% if node positive, if antiestrogens are the only systemic treatment, also predicting a greater than 15% benefit from chemotherapy  (2) genetics testing 01/01/2021 through the [facility] CustomNext-Cancer +RNAinsight Panel found no deleterious mutations in APC, ATM, AXIN2, BARD1, BMPR1A, BRCA1, BRCA2, BRIP1, CDH1, CDK4, CDKN2A, CHEK2, DICER1, EPCAM, GREM1, HOXB13, MEN1, MLH1, MSH2, MSH3, MSH6, MUTYH, NBN, NF1, NF2, NTHL1, PALB2, PMS2, POLD1, POLE, PTEN, RAD51C,  RAD51D, RECQL, RET, SDHA, SDHAF2, SDHB, SDHC, SDHD, SMAD4, SMARCA4, STK11, TP53, TSC1, TSC2, and VHL.  RNA data is routinely analyzed for use in variant interpretation for all genes.  (3) anastrozole started neoadjuvantly 12/16/2020, discontinued 03/2021 with multiple side effect  (4) status post left mastectomy and sentinel lymph node sampling 04/07/2021 for a pT2 pN0, stage IIA invasive ductal carcinoma, grade 2, with negative margins  (A) a single left axilla lymph node removed  (B) repeat prognostic panel finds the cancer estrogen receptor positive, progesterone receptor and HER2 negative.  (5) adjuvant chemotherapy (CMF) starting 05/17/2021, to be repeated times  (6) adjuvant tamoxifen to follow   PLAN: Gina Hunt did pretty well with her first cycle of CMF but I think we can do better.  I am eliminating palonosetron from her premeds since I think that is what caused her constipation and headache.  I substituted aprepitant and lorazepam and Gina Hunt is aware that she will need someone to drive her home.  Am adding valacyclovir to prevent further problems with mouth sores.  I am also increasing the dexamethasone to 8 mg twice daily instead of 4 mg once a day since she did have nausea although no vomiting this time.  I have encouraged her to walk as much as possible.  She is walking most mornings with her niece at 6:30 AM about a mile and a half and that is very favorable.  Otherwise she will return for treatment next week.  I am going to see her the week after treatment for her remaining cycles.  This will allow me to check her nadir counts and decide if she ever needs an immune booster at any point  She knows to call for any other issue that may develop before then  Total encounter time 25 minutes.Gina Jews C. Riyad Keena, MD 05/26/2021 12:05 PM Medical Oncology and Hematology [address]  [place], [place] [address] Tel. [phone]    Fax. [phone]   This  document serves as a record of services personally performed by Lurline Del, MD. It was created on his behalf by Wilburn Mylar, a trained medical scribe. The creation of this record is based on the scribe's personal observations and the provider's statements to them.   I, Lurline Del MD, have reviewed the above documentation for accuracy and completeness, and I agree with the above.   *Total Encounter Time as defined by the Centers for Medicare and Medicaid Services includes, in addition to the face-to-face time of a patient visit (documented in the note above) non-face-to-face time: obtaining and reviewing outside history, ordering and reviewing medications, tests or procedures, care coordination (communications with other health care professionals or caregivers) and documentation in the medical record.

## 2021-06-01 DIAGNOSIS — M25612 Stiffness of left shoulder, not elsewhere classified: Secondary | ICD-10-CM | POA: Diagnosis not present

## 2021-06-01 DIAGNOSIS — M25512 Pain in left shoulder: Secondary | ICD-10-CM | POA: Diagnosis not present

## 2021-06-02 ENCOUNTER — Other Ambulatory Visit: Payer: Self-pay

## 2021-06-02 ENCOUNTER — Encounter: Payer: Self-pay | Admitting: Family Medicine

## 2021-06-02 ENCOUNTER — Ambulatory Visit (INDEPENDENT_AMBULATORY_CARE_PROVIDER_SITE_OTHER): Payer: Medicare HMO | Admitting: Family Medicine

## 2021-06-02 VITALS — BP 138/82 | HR 73 | Temp 98.1°F | Ht 62.0 in | Wt 132.5 lb

## 2021-06-02 DIAGNOSIS — E785 Hyperlipidemia, unspecified: Secondary | ICD-10-CM

## 2021-06-02 DIAGNOSIS — E039 Hypothyroidism, unspecified: Secondary | ICD-10-CM | POA: Diagnosis not present

## 2021-06-02 DIAGNOSIS — M797 Fibromyalgia: Secondary | ICD-10-CM | POA: Diagnosis not present

## 2021-06-02 DIAGNOSIS — Z8719 Personal history of other diseases of the digestive system: Secondary | ICD-10-CM | POA: Diagnosis not present

## 2021-06-02 LAB — TSH: TSH: 2.02 u[IU]/mL (ref 0.35–5.50)

## 2021-06-02 LAB — LIPID PANEL
Cholesterol: 169 mg/dL (ref 0–200)
HDL: 51 mg/dL (ref >39.00)
LDL Cholesterol: 86 mg/dL (ref 0–99)
NonHDL: 118.06
Total CHOL/HDL Ratio: 3
Triglycerides: 161 mg/dL — ABNORMAL HIGH (ref 0.0–149.0)
VLDL: 32.2 mg/dL (ref 0.0–40.0)

## 2021-06-02 LAB — T4, FREE: Free T4: 0.75 ng/dL (ref 0.60–1.60)

## 2021-06-02 MED ORDER — OMEPRAZOLE 40 MG PO CPDR: 40.0000 mg | DELAYED_RELEASE_CAPSULE | Freq: Every day | ORAL | 2 refills | Status: DC

## 2021-06-02 MED ORDER — DULOXETINE HCL 60 MG PO CPEP: 60.0000 mg | ORAL_CAPSULE | Freq: Two times a day (BID) | ORAL | 2 refills | Status: DC

## 2021-06-02 MED ORDER — SIMVASTATIN 40 MG PO TABS: 40.0000 mg | ORAL_TABLET | Freq: Every day | ORAL | 2 refills | Status: DC

## 2021-06-02 MED ORDER — LEVOTHYROXINE SODIUM 50 MCG PO TABS: 50.0000 ug | ORAL_TABLET | Freq: Every day | ORAL | 2 refills | Status: DC

## 2021-06-02 NOTE — Progress Notes (Signed)
Chief Complaint  Patient presents with   Follow-up    Subjective: Hyperlipidemia Patient presents for Hyperlipidemia follow up. Currently taking Zocor 40 mg/d and compliance with treatment thus far has been good. She denies myalgias. She is adhering to a healthy diet. Exercise: walking The patient is not known to have coexisting coronary artery disease. No Cp or SOB.  Hypothyroidism Patient presents for follow-up of hypothyroidism.  Reports compliance with medication. Current symptoms include: denies fatigue, weight changes, heat/cold intolerance, bowel/skin changes or CVS symptoms She believes her dose should be unchanged  Fibromyalgia The patient has a history of fibromyalgia.  She has been walking at least once daily but sometimes twice.  She takes Cymbalta 60 mg twice daily.  She reports compliance with no adverse effects.  Her pain is usually well controlled over the summer and gets worse in the wintertime.  Past Medical History:  Diagnosis Date   Anxiety    Breast cancer ([facility]) 01/2021   left breast IDC   Complication of anesthesia    Family history of breast cancer 12/16/2020   Family history of gastric cancer 12/16/2020   Fibromyalgia    GERD (gastroesophageal reflux disease)    Hyperlipidemia    Hypothyroidism    Osteoarthritis    knees, hands, fingers   Osteoarthritis    PONV (postoperative nausea and vomiting)    Skin cancer     Objective: BP 138/82   Pulse 73   Temp 98.1 F (36.7 C) (Oral)   Ht '5\' 2"'$  (1.575 m)   Wt 132 lb 8 oz (60.1 kg)   SpO2 97%   BMI 24.23 kg/m  General: Awake, appears stated age HEENT: MMM Heart: RRR, no LE edema, no bruits Lungs: CTAB, no rales, wheezes or rhonchi. No accessory muscle use Abdomen: Diffusely tender to palpation but worse in the epigastric region, bowel sounds present, soft Psych: Age appropriate judgment and insight, normal affect and mood  Assessment and Plan: Hyperlipidemia, unspecified hyperlipidemia type -  Plan: Lipid panel  Fibromyalgia - Plan: DULoxetine (CYMBALTA) 60 MG capsule  Hypothyroidism, unspecified type - Plan: TSH, T4, free  History of Barrett's esophagus - Plan: Ambulatory referral to Gastroenterology  Chronic, uncertain if stable.  Check labs.  Continue Zocor 40 mg daily.  Counseled on diet and exercise. Chronic, stable.  Stay active.  Continue Cymbalta 60 mg twice daily. Chronic, stable.  Continue levothyroxine 50 mcg daily, check labs. Refer back to GI, she will increase her omeprazole to twice daily over the next month.  If this is not improved, she will either follow-up with me if she cannot get in with the GI team in a timely manner. F/u in 6 months for physical or as needed. The patient voiced understanding and agreement to the plan.  [place], DO 06/02/21  12:14 PM

## 2021-06-02 NOTE — Patient Instructions (Signed)
Keep the diet clean and stay active.  Give us 2-3 business days to get the results of your labs back.   Let us know if you need anything.  

## 2021-06-03 DIAGNOSIS — M25612 Stiffness of left shoulder, not elsewhere classified: Secondary | ICD-10-CM | POA: Diagnosis not present

## 2021-06-03 DIAGNOSIS — M25512 Pain in left shoulder: Secondary | ICD-10-CM | POA: Diagnosis not present

## 2021-06-07 ENCOUNTER — Inpatient Hospital Stay: Payer: Medicare HMO | Attending: Oncology

## 2021-06-07 ENCOUNTER — Inpatient Hospital Stay: Payer: Medicare HMO

## 2021-06-07 ENCOUNTER — Other Ambulatory Visit: Payer: Self-pay

## 2021-06-07 VITALS — BP 154/81 | HR 62 | Temp 97.7°F | Resp 20 | Wt 131.8 lb

## 2021-06-07 DIAGNOSIS — C50412 Malignant neoplasm of upper-outer quadrant of left female breast: Secondary | ICD-10-CM | POA: Diagnosis not present

## 2021-06-07 DIAGNOSIS — R110 Nausea: Secondary | ICD-10-CM | POA: Diagnosis not present

## 2021-06-07 DIAGNOSIS — Z5111 Encounter for antineoplastic chemotherapy: Secondary | ICD-10-CM | POA: Diagnosis not present

## 2021-06-07 DIAGNOSIS — Z95828 Presence of other vascular implants and grafts: Secondary | ICD-10-CM

## 2021-06-07 DIAGNOSIS — Z170 Estrogen receptor positive status [ER+]: Secondary | ICD-10-CM | POA: Diagnosis not present

## 2021-06-07 DIAGNOSIS — Z79899 Other long term (current) drug therapy: Secondary | ICD-10-CM | POA: Insufficient documentation

## 2021-06-07 LAB — CBC WITH DIFFERENTIAL/PLATELET
Abs Immature Granulocytes: 0.02 10*3/uL (ref 0.00–0.07)
Basophils Absolute: 0 10*3/uL (ref 0.0–0.1)
Basophils Relative: 1 %
Eosinophils Absolute: 0.3 10*3/uL (ref 0.0–0.5)
Eosinophils Relative: 5 %
HCT: 38.2 % (ref 36.0–46.0)
Hemoglobin: 12.6 g/dL (ref 12.0–15.0)
Immature Granulocytes: 0 %
Lymphocytes Relative: 26 %
Lymphs Abs: 1.6 10*3/uL (ref 0.7–4.0)
MCH: 30.7 pg (ref 26.0–34.0)
MCHC: 33 g/dL (ref 30.0–36.0)
MCV: 92.9 fL (ref 80.0–100.0)
Monocytes Absolute: 0.9 10*3/uL (ref 0.1–1.0)
Monocytes Relative: 14 %
Neutro Abs: 3.4 10*3/uL (ref 1.7–7.7)
Neutrophils Relative %: 54 %
Platelets: 298 10*3/uL (ref 150–400)
RBC: 4.11 MIL/uL (ref 3.87–5.11)
RDW: 13.2 % (ref 11.5–15.5)
WBC: 6.2 10*3/uL (ref 4.0–10.5)
nRBC: 0 % (ref 0.0–0.2)

## 2021-06-07 LAB — LIPID PANEL
Cholesterol: 176 mg/dL (ref 0–200)
HDL: 56 mg/dL (ref >40)
LDL Cholesterol: 97 mg/dL (ref 0–99)
Total CHOL/HDL Ratio: 3.1 RATIO
Triglycerides: 114 mg/dL (ref <150)
VLDL: 23 mg/dL (ref 0–40)

## 2021-06-07 LAB — COMPREHENSIVE METABOLIC PANEL
ALT: 13 U/L (ref 0–44)
AST: 15 U/L (ref 15–41)
Albumin: 3.7 g/dL (ref 3.5–5.0)
Alkaline Phosphatase: 91 U/L (ref 38–126)
Anion gap: 7 (ref 5–15)
BUN: 9 mg/dL (ref 8–23)
CO2: 27 mmol/L (ref 22–32)
Calcium: 8.9 mg/dL (ref 8.9–10.3)
Chloride: 107 mmol/L (ref 98–111)
Creatinine, Ser: 0.71 mg/dL (ref 0.44–1.00)
GFR, Estimated: >60 mL/min (ref >60)
Glucose, Bld: 102 mg/dL — ABNORMAL HIGH (ref 70–99)
Potassium: 4 mmol/L (ref 3.5–5.1)
Sodium: 141 mmol/L (ref 135–145)
Total Bilirubin: 0.3 mg/dL (ref 0.3–1.2)
Total Protein: 6.8 g/dL (ref 6.5–8.1)

## 2021-06-07 MED ORDER — SODIUM CHLORIDE 0.9 % IV SOLN
Freq: Once | INTRAVENOUS | Status: AC
  Filled 2021-06-07: qty 250

## 2021-06-07 MED ORDER — SODIUM CHLORIDE 0.9 % IV SOLN
10.0000 mg | Freq: Once | INTRAVENOUS | Status: AC
  Administered 2021-06-07: 10 mg via INTRAVENOUS
  Filled 2021-06-07: qty 10

## 2021-06-07 MED ORDER — FLUOROURACIL CHEMO INJECTION 2.5 GM/50ML
600.0000 mg/m2 | Freq: Once | INTRAVENOUS | Status: AC
  Administered 2021-06-07: 950 mg via INTRAVENOUS
  Filled 2021-06-07: qty 19

## 2021-06-07 MED ORDER — LORAZEPAM 2 MG/ML IJ SOLN
0.5000 mg | Freq: Once | INTRAMUSCULAR | Status: AC
  Administered 2021-06-07: 0.5 mg via INTRAVENOUS

## 2021-06-07 MED ORDER — METHOTREXATE SODIUM (PF) CHEMO INJECTION 250 MG/10ML
40.1000 mg/m2 | Freq: Once | INTRAMUSCULAR | Status: AC
Start: 2021-06-07 — End: 2021-06-07
  Administered 2021-06-07: 65 mg via INTRAVENOUS
  Filled 2021-06-07: qty 2.6

## 2021-06-07 MED ORDER — LORAZEPAM 2 MG/ML IJ SOLN
INTRAMUSCULAR | Status: AC
  Filled 2021-06-07: qty 1

## 2021-06-07 MED ORDER — HEPARIN SOD (PORK) LOCK FLUSH 100 UNIT/ML IV SOLN
500.0000 [IU] | Freq: Once | INTRAVENOUS | Status: AC | PRN
  Administered 2021-06-07: 500 [IU]
  Filled 2021-06-07: qty 5

## 2021-06-07 MED ORDER — SODIUM CHLORIDE 0.9 % IV SOLN
600.0000 mg/m2 | Freq: Once | INTRAVENOUS | Status: AC
  Administered 2021-06-07: 980 mg via INTRAVENOUS
  Filled 2021-06-07: qty 49

## 2021-06-07 MED ORDER — SODIUM CHLORIDE 0.9 % IV SOLN
150.0000 mg | Freq: Once | INTRAVENOUS | Status: AC
  Administered 2021-06-07: 150 mg via INTRAVENOUS
  Filled 2021-06-07: qty 150

## 2021-06-07 MED ORDER — SODIUM CHLORIDE 0.9% FLUSH
10.0000 mL | INTRAVENOUS | Status: DC | PRN
  Administered 2021-06-07: 10 mL
  Filled 2021-06-07: qty 10

## 2021-06-07 MED ORDER — SODIUM CHLORIDE 0.9% FLUSH
10.0000 mL | Freq: Once | INTRAVENOUS | Status: AC
  Administered 2021-06-07: 10 mL
  Filled 2021-06-07: qty 10

## 2021-06-07 NOTE — Patient Instructions (Signed)
[facility]  Discharge Instructions: Thank you for choosing [facility] to provide your oncology and hematology care.   If you have a lab appointment with the [facility], please go directly to the [facility] and check in at the registration area.   Wear comfortable clothing and clothing appropriate for easy access to any Portacath or PICC line.   We strive to give you quality time with your provider. You may need to reschedule your appointment if you arrive late (15 or more minutes).  Arriving late affects you and other patients whose appointments are after yours.  Also, if you miss three or more appointments without notifying the office, you may be dismissed from the clinic at the provider's discretion.      For prescription refill requests, have your pharmacy contact our office and allow 72 hours for refills to be completed.    Today you received the following chemotherapy and/or immunotherapy agents : Cytoxan, Methotrexate, 5-fu      To help prevent nausea and vomiting after your treatment, we encourage you to take your nausea medication as directed.  BELOW ARE SYMPTOMS THAT SHOULD BE REPORTED IMMEDIATELY: *FEVER GREATER THAN 100.4 F (38 C) OR HIGHER *CHILLS OR SWEATING *NAUSEA AND VOMITING THAT IS NOT CONTROLLED WITH YOUR NAUSEA MEDICATION *UNUSUAL SHORTNESS OF BREATH *UNUSUAL BRUISING OR BLEEDING *URINARY PROBLEMS (pain or burning when urinating, or frequent urination) *BOWEL PROBLEMS (unusual diarrhea, constipation, pain near the anus) TENDERNESS IN MOUTH AND THROAT WITH OR WITHOUT PRESENCE OF ULCERS (sore throat, sores in mouth, or a toothache) UNUSUAL RASH, SWELLING OR PAIN  UNUSUAL VAGINAL DISCHARGE OR ITCHING   Items with * indicate a potential emergency and should be followed up as soon as possible or go to the Emergency Department if any problems should occur.  Please show the CHEMOTHERAPY ALERT CARD or IMMUNOTHERAPY ALERT  CARD at check-in to the Emergency Department and triage nurse.  Should you have questions after your visit or need to cancel or reschedule your appointment, please contact [facility]  Dept: [phone]  and follow the prompts.  Office hours are 8:00 a.m. to 4:30 p.m. Monday - Friday. Please note that voicemails left after 4:00 p.m. may not be returned until the following business day.  We are closed weekends and major holidays. You have access to a nurse at all times for urgent questions. Please call the main number to the clinic Dept: [phone] and follow the prompts.   For any non-urgent questions, you may also contact your provider using MyChart. We now offer e-Visits for anyone 36 and older to request care online for non-urgent symptoms. For details visit [email or web address].   Also download the MyChart app! Go to the app store, search "MyChart", open the app, select [facility], and log in with your MyChart username and password.  Due to Covid, a mask is required upon entering the hospital/clinic. If you do not have a mask, one will be given to you upon arrival. For doctor visits, patients may have 1 support person aged 51 or older with them. For treatment visits, patients cannot have anyone with them due to current Covid guidelines and our immunocompromised population.

## 2021-06-13 NOTE — Progress Notes (Addendum)
[place]  Telephone:([phone]) [phone] Fax:(336) [phone]     ID: Gina Hunt DOB: 05-Jan-1946  MR#: [id number]  RWE#:[id number]  Patient Care Team: [name], DO as PCP - General (Family Medicine) Rockwell Germany, RN as Oncology Nurse Navigator Mauro Kaufmann, RN as Oncology Nurse Navigator Rolm Bookbinder, MD as Consulting Physician (General Surgery) Gina Hunt, Gina Dad, MD as Consulting Physician (Oncology) Kyung Rudd, MD as Consulting Physician (Radiation Oncology) Sheryn Bison, MD as Consulting Physician (Dermatology) Chauncey Cruel, MD OTHER MD:   CHIEF COMPLAINT: Estrogen receptor positive breast cancer (s/p left mastectomy)  CURRENT TREATMENT: Adjuvant chemotherapy   INTERVAL HISTORY: Gina Hunt returns today for follow up and treatment of her estrogen receptor positive breast cancer.  She is accompanied by her niece Gina Hunt began adjuvant CMF on 05/17/2021. Today is day 8 cycle 2.   REVIEW OF SYSTEMS: Gina Hunt did a bit better with the second cycle.  This time instead of having a lot of constipation she had alternating constipation and diarrhea.  She tells me this is actually a problem she has had for many years.  When she is constipated she take stool softeners and when she has diarrhea she takes Imodium.  She is also having insomnia but this also is not a new problem.  However when she had her chemo and we added lorazepam she felt very sleepy into the following day.  She did have some nausea but no vomiting.  She thinks she is losing weight since she has not been eating as much.  She keeps a dry mouth.  Aside from these issues a detailed review of systems today was stable   COVID 19 VACCINATION STATUS: Gina Hunt x2, no booster as of 12/2020   HISTORY OF CURRENT ILLNESS: From the original intake note:  Gina Hunt (pronounced "Erlene Quan") had routine screening mammography on 11/24/2020 showing a possible abnormality in the left breast.  She underwent left breast ultrasonography at [facility] on 12/02/2020 showing: breast density category A; 2.5 cm mass in left breast at 2 o'clock (measuring 1.7 cm in ultrasound); no abnormal left axillary lymph nodes.  Accordingly on 12/08/2020 she proceeded to biopsy of the left breast area in question. The pathology from this procedure ([id number]) showed: invasive ductal carcinoma, grade 3. Prognostic indicators significant for: estrogen receptor, 95% positive with strong staining intensity and progesterone receptor, 80% positive with moderate staining intensity. Proliferation marker Ki67 at 30%. HER2 equivocal by immunohistochemistry (2+), but negative by fluorescent in situ hybridization with a signals ratio 1.64 and number per cell 2.95.  Cancer Staging Malignant neoplasm of upper-outer quadrant of left breast in female, estrogen receptor positive ([place]) Staging form: Breast, AJCC 8th Edition - Clinical stage from 12/16/2020: Stage IIA (cT2, cN0, cM0, G3, ER+, PR+, HER2-) - Signed by Chauncey Cruel, MD on 12/16/2020 Stage prefix: Initial diagnosis  The patient's subsequent history is as detailed below.   PAST MEDICAL HISTORY: Past Medical History:  Diagnosis Date   Anxiety    Breast cancer ([place]) 01/2021   left breast IDC   Complication of anesthesia    Family history of breast cancer 12/16/2020   Family history of gastric cancer 12/16/2020   Fibromyalgia    GERD (gastroesophageal reflux disease)    Hyperlipidemia    Hypothyroidism    Osteoarthritis    knees, hands, fingers   Osteoarthritis    PONV (postoperative nausea and vomiting)    Skin cancer     PAST SURGICAL HISTORY: Past  Surgical History:  Procedure Laterality Date   ABDOMINAL HYSTERECTOMY     COLONOSCOPY  11/24/2015   EVACUATION BREAST HEMATOMA Left 04/07/2021   Procedure: EVACUATION HEMATOMA BREAST;  Surgeon: Rolm Bookbinder, MD;  Location: [facility];  Service: General;  Laterality: Left;   LIVER  SURGERY     PORTA CATH INSERTION Right 04/07/2021   Procedure: PORTA CATH INSERTION;  Surgeon: Rolm Bookbinder, MD;  Location: [place];  Service: General;  Laterality: Right;   SIMPLE MASTECTOMY WITH AXILLARY SENTINEL NODE BIOPSY Left 04/07/2021   Procedure: LEFT SIMPLE MASTECTOMY WITH LEFT AXILLARY SENTINEL NODE BIOPSY;  Surgeon: Rolm Bookbinder, MD;  Location: [place];  Service: General;  Laterality: Left;   TONSILLECTOMY      FAMILY HISTORY: Family History  Problem Relation Age of Onset   Hypertension Mother    Diabetes Mother    Arthritis Mother    Heart disease Mother    Rheum arthritis Father    Diabetes Father    Heart disease Father    Stomach cancer Brother 24   Stomach cancer Brother 75   Esophageal cancer Brother 21   Breast cancer Maternal Aunt 70   Breast cancer Maternal Aunt 72   Breast cancer Maternal Aunt 73   Colon cancer Neg Hx    Colon polyps Neg Hx   Her father and mother both died from MI, father age 70 and mother age 42. Marrion has 6 brothers and 1 sister. She reports breast cancer in 3 maternal aunts, one in their 43's. One of her maternal aunt's daughter (Liliyana's cousin) had breast cancer at age 11.The patient also reports stomach cancer in two of her brothers in their 25's, one of whom also had esophageal cancer.    GYNECOLOGIC HISTORY:  No LMP recorded. Patient has had a hysterectomy. Menarche: 75 years old Age at first live birth: 75 years old [place] P 1 LMP age 50 Contraceptive: never used HRT never used  Hysterectomy? yes BSO? yes   SOCIAL HISTORY: (updated 12/2020)  Gina Hunt is currently retired from working at the [employer] in [place] died late February 7672 from complications of Agent Orange exposure. She lives at home alone. Daughter Gina Hunt lives in [place], the patient believes, but they have been alienated for many years.    ADVANCED DIRECTIVES: not in place; intends to  name her niece, [name], as her HCPOA.  The patient was given the appropriate documents to complete and notarized at her discretion on her 12/16/2020 visit   HEALTH MAINTENANCE: Social History   Tobacco Use   Smoking status: Never   Smokeless tobacco: Never  Vaping Use   Vaping Use: Never used  Substance Use Topics   Alcohol use: Yes    Comment: rarely    Drug use: Never     Colonoscopy: 05/2019 (Dr. Bryan Lemma), recall 2027  PAP: "10 years ago" (~2012)  Bone density: 2020   Allergies  Allergen Reactions   Codeine Other (See Comments)    Hallucination    Morphine And Related Other (See Comments)    Knocks me out per pt    Current Outpatient Medications  Medication Sig Dispense Refill   dexamethasone (DECADRON) 4 MG tablet Take 2 tablets (8 mg total) by mouth 2 (two) times daily. Start the day after chemotherapy for 2 days. Take with food. 30 tablet 1   DULoxetine (CYMBALTA) 60 MG capsule Take 1 capsule (60 mg total) by mouth 2 (  two) times daily. 180 capsule 2   gabapentin (NEURONTIN) 300 MG capsule Take 1 capsule (300 mg total) by mouth 3 (three) times daily. 270 capsule 1   levothyroxine (SYNTHROID) 50 MCG tablet Take 1 tablet (50 mcg total) by mouth daily before breakfast. 90 tablet 2   lidocaine-prilocaine (EMLA) cream Apply to affected area once 30 g 3   methocarbamol (ROBAXIN) 500 MG tablet Take 1 tablet (500 mg total) by mouth every 6 (six) hours as needed for muscle spasms. 20 tablet 0   omeprazole (PRILOSEC) 40 MG capsule Take 1 capsule (40 mg total) by mouth daily. 90 capsule 2   prochlorperazine (COMPAZINE) 10 MG tablet Take 1 tablet (10 mg total) by mouth every 6 (six) hours as needed (Nausea or vomiting). 30 tablet 1   simvastatin (ZOCOR) 40 MG tablet Take 1 tablet (40 mg total) by mouth daily. 90 tablet 2   valACYclovir (VALTREX) 1000 MG tablet Take 1 tablet (1,000 mg total) by mouth 2 (two) times daily. 90 tablet 2   No current facility-administered  medications for this visit.    OBJECTIVE: White woman who appears stated age  25:   06/14/21 1233  BP: (!) 155/70  Pulse: 68  Resp: 18  Temp: 97.7 F (36.5 C)  SpO2: 100%     Body mass index is 24.05 kg/m.   Wt Readings from Last 3 Encounters:  06/14/21 131 lb 8 oz (59.6 kg)  06/07/21 131 lb 13.4 oz (59.8 kg)  06/02/21 132 lb 8 oz (60.1 kg)     ECOG FS:1 - Symptomatic but completely ambulatory  Sclerae unicteric, EOMs intact Wearing a mask No cervical or supraclavicular adenopathy Lungs no rales or rhonchi Heart regular rate and rhythm Abd soft, nontender, positive bowel sounds MSK no focal spinal tenderness, no upper extremity lymphedema Neuro: nonfocal, well oriented, appropriate affect Breasts: The right breast is benign.  She has undergone left mastectomy with no evidence of residual or recurrent disease.  Both axillae are benign.   LAB RESULTS:  CMP     Component Value Date/Time   NA 142 06/14/2021 1218   K 3.8 06/14/2021 1218   CL 107 06/14/2021 1218   CO2 26 06/14/2021 1218   GLUCOSE 80 06/14/2021 1218   BUN 9 06/14/2021 1218   CREATININE 0.66 06/14/2021 1218   CREATININE 0.72 04/27/2021 1236   CREATININE 0.58 (L) 12/20/2019 1422   CALCIUM 8.4 (L) 06/14/2021 1218   PROT 6.6 06/14/2021 1218   ALBUMIN 3.6 06/14/2021 1218   AST 13 (L) 06/14/2021 1218   AST 15 04/27/2021 1236   ALT 11 06/14/2021 1218   ALT 11 04/27/2021 1236   ALKPHOS 90 06/14/2021 1218   BILITOT 0.3 06/14/2021 1218   BILITOT 0.3 04/27/2021 1236   GFRNONAA >60 06/14/2021 1218   GFRNONAA >60 04/27/2021 1236    No results found for: TOTALPROTELP, ALBUMINELP, A1GS, A2GS, BETS, BETA2SER, GAMS, MSPIKE, SPEI  Lab Results  Component Value Date   WBC 7.3 06/14/2021   NEUTROABS 5.2 06/14/2021   HGB 12.0 06/14/2021   HCT 36.3 06/14/2021   MCV 92.6 06/14/2021   PLT 210 06/14/2021    No results found for: LABCA2  No components found for: ZOXWRU045  No results for input(s): INR  in the last 168 hours.  No results found for: LABCA2  No results found for: WUJ811  No results found for: BJY782  No results found for: NFA213  No results found for: CA2729  No components found for: HGQUANT  No results found for: CEA1 / No results found for: CEA1   No results found for: AFPTUMOR  No results found for: CHROMOGRNA  No results found for: KPAFRELGTCHN, LAMBDASER, KAPLAMBRATIO (kappa/lambda light chains)  No results found for: HGBA, HGBA2QUANT, HGBFQUANT, HGBSQUAN (Hemoglobinopathy evaluation)   No results found for: LDH  No results found for: IRON, TIBC, IRONPCTSAT (Iron and TIBC)  No results found for: FERRITIN  Urinalysis No results found for: COLORURINE, APPEARANCEUR, LABSPEC, PHURINE, GLUCOSEU, HGBUR, BILIRUBINUR, KETONESUR, PROTEINUR, UROBILINOGEN, NITRITE, LEUKOCYTESUR   STUDIES: No results found.   ELIGIBLE FOR AVAILABLE RESEARCH PROTOCOL: no  ASSESSMENT: 75 y.o. Gina Hunt, [place] woman status post left breast upper outer quadrant biopsy 12/08/2020 for a clinical T2N0, stage IIA invasive ductal carcinoma, grade 3, estrogen and progesterone receptor positive, HER-2 not amplified, with an MIB-1 of 30%  (1) Oncotype obtained from the original biopsy shows a score of 32, predicting a risk of recurrence outside the breast in the next 9 years of 20% if node-negative, 25% if node positive, if antiestrogens are the only systemic treatment, also predicting a greater than 15% benefit from chemotherapy  (2) genetics testing 01/01/2021 through the [facility]Next-Cancer +RNAinsight Panel found no deleterious mutations in APC, ATM, AXIN2, BARD1, BMPR1A, BRCA1, BRCA2, BRIP1, CDH1, CDK4, CDKN2A, CHEK2, DICER1, EPCAM, GREM1, HOXB13, MEN1, MLH1, MSH2, MSH3, MSH6, MUTYH, NBN, NF1, NF2, NTHL1, PALB2, PMS2, POLD1, POLE, PTEN, RAD51C, RAD51D, RECQL, RET, SDHA, SDHAF2, SDHB, SDHC, SDHD, SMAD4, SMARCA4, STK11, TP53, TSC1, TSC2, and VHL.  RNA data is routinely analyzed for use  in variant interpretation for all genes.  (3) anastrozole started neoadjuvantly 12/16/2020, discontinued 03/2021 with multiple side effect  (4) status post left mastectomy and sentinel lymph node sampling 04/07/2021 for a pT2 pN0, stage IIA invasive ductal carcinoma, grade 2, with negative margins  (A) a single left axilla lymph node removed  (B) repeat prognostic panel finds the cancer estrogen receptor positive, progesterone receptor and HER2 negative.  (5) adjuvant chemotherapy (CMF) starting 05/17/2021, to be repeated times  (6) adjuvant tamoxifen to follow   PLAN: Jurnei did better with her second cycle of CMF chemotherapy.  We are learning that some of her symptoms really are likely not due to the chemotherapy.  She seems to have longstanding irritable bowel syndrome for example.  We discussed how to manage that  On the other hand she does have some nausea from the treatment and she thinks her hair is thinning slightly.  Thankfully this is not particularly noticeable to a casual glance.  Though she has irritable bowel and some problems with appetite her weight is stable.  I am stopping the lorazepam which we added to her premed since that seems to have caused excessive sedation.  She is considering receiving a booster for COVID and I suggested she do that a week after treatment to give her body time to make antibodies.  She will return in 2 weeks for her third cycle of chemotherapy and she will see me a week after to monitor counts and side effects  Total encounter time 35 minutes.Sarajane Jews C. , MD 06/14/2021 3:15 PM Medical Oncology and Hematology [facility] [place], [place] [address] Tel. [phone]    Fax. [phone]   This document serves as a record of services personally performed by Lurline Del, MD. It was created on his behalf by Wilburn Mylar, a trained medical scribe. The creation of this record is based on the scribe's  personal observations and  the provider's statements to them.   I, Lurline Del MD, have reviewed the above documentation for accuracy and completeness, and I agree with the above.   *Total Encounter Time as defined by the Centers for Medicare and Medicaid Services includes, in addition to the face-to-face time of a patient visit (documented in the note above) non-face-to-face time: obtaining and reviewing outside history, ordering and reviewing medications, tests or procedures, care coordination (communications with other health care professionals or caregivers) and documentation in the medical record.

## 2021-06-14 ENCOUNTER — Other Ambulatory Visit: Payer: Medicare HMO

## 2021-06-14 ENCOUNTER — Inpatient Hospital Stay: Payer: Medicare HMO

## 2021-06-14 ENCOUNTER — Other Ambulatory Visit: Payer: Self-pay

## 2021-06-14 ENCOUNTER — Inpatient Hospital Stay: Payer: Medicare HMO | Admitting: Oncology

## 2021-06-14 VITALS — BP 155/70 | HR 68 | Temp 97.7°F | Resp 18 | Ht 62.0 in | Wt 131.5 lb

## 2021-06-14 DIAGNOSIS — C50412 Malignant neoplasm of upper-outer quadrant of left female breast: Secondary | ICD-10-CM | POA: Diagnosis not present

## 2021-06-14 DIAGNOSIS — Z79899 Other long term (current) drug therapy: Secondary | ICD-10-CM | POA: Diagnosis not present

## 2021-06-14 DIAGNOSIS — Z170 Estrogen receptor positive status [ER+]: Secondary | ICD-10-CM | POA: Diagnosis not present

## 2021-06-14 DIAGNOSIS — R110 Nausea: Secondary | ICD-10-CM | POA: Diagnosis not present

## 2021-06-14 DIAGNOSIS — Z5111 Encounter for antineoplastic chemotherapy: Secondary | ICD-10-CM | POA: Diagnosis not present

## 2021-06-14 LAB — CBC WITH DIFFERENTIAL/PLATELET
Abs Immature Granulocytes: 0.08 10*3/uL — ABNORMAL HIGH (ref 0.00–0.07)
Basophils Absolute: 0.1 10*3/uL (ref 0.0–0.1)
Basophils Relative: 1 %
Eosinophils Absolute: 0.2 10*3/uL (ref 0.0–0.5)
Eosinophils Relative: 3 %
HCT: 36.3 % (ref 36.0–46.0)
Hemoglobin: 12 g/dL (ref 12.0–15.0)
Immature Granulocytes: 1 %
Lymphocytes Relative: 20 %
Lymphs Abs: 1.4 10*3/uL (ref 0.7–4.0)
MCH: 30.6 pg (ref 26.0–34.0)
MCHC: 33.1 g/dL (ref 30.0–36.0)
MCV: 92.6 fL (ref 80.0–100.0)
Monocytes Absolute: 0.4 10*3/uL (ref 0.1–1.0)
Monocytes Relative: 5 %
Neutro Abs: 5.2 10*3/uL (ref 1.7–7.7)
Neutrophils Relative %: 70 %
Platelets: 210 10*3/uL (ref 150–400)
RBC: 3.92 MIL/uL (ref 3.87–5.11)
RDW: 13.1 % (ref 11.5–15.5)
WBC: 7.3 10*3/uL (ref 4.0–10.5)
nRBC: 0 % (ref 0.0–0.2)

## 2021-06-14 LAB — COMPREHENSIVE METABOLIC PANEL
ALT: 11 U/L (ref 0–44)
AST: 13 U/L — ABNORMAL LOW (ref 15–41)
Albumin: 3.6 g/dL (ref 3.5–5.0)
Alkaline Phosphatase: 90 U/L (ref 38–126)
Anion gap: 9 (ref 5–15)
BUN: 9 mg/dL (ref 8–23)
CO2: 26 mmol/L (ref 22–32)
Calcium: 8.4 mg/dL — ABNORMAL LOW (ref 8.9–10.3)
Chloride: 107 mmol/L (ref 98–111)
Creatinine, Ser: 0.66 mg/dL (ref 0.44–1.00)
GFR, Estimated: >60 mL/min (ref >60)
Glucose, Bld: 80 mg/dL (ref 70–99)
Potassium: 3.8 mmol/L (ref 3.5–5.1)
Sodium: 142 mmol/L (ref 135–145)
Total Bilirubin: 0.3 mg/dL (ref 0.3–1.2)
Total Protein: 6.6 g/dL (ref 6.5–8.1)

## 2021-06-14 NOTE — Patient Instructions (Signed)
Implanted Port Home Guide An implanted port is a device that is placed under the skin. It is usually placed in the chest. The device can be used to give IV medicine, to take blood, or for dialysis. You may have an implanted port if: You need IV medicine that would be irritating to the small veins in your hands or arms. You need IV medicines, such as antibiotics, for a long period of time. You need IV nutrition for a long period of time. You need dialysis. When you have a port, your health care provider can choose to use the port instead of veins in your arms for these procedures. You may have fewer limitations when using a port than you would if you used other types of long-term IVs, and you will likely be able to return to normal activities afteryour incision heals. An implanted port has two main parts: Reservoir. The reservoir is the part where a needle is inserted to give medicines or draw blood. The reservoir is round. After it is placed, it appears as a small, raised area under your skin. Catheter. The catheter is a thin, flexible tube that connects the reservoir to a vein. Medicine that is inserted into the reservoir goes into the catheter and then into the vein. How is my port accessed? To access your port: A numbing cream may be placed on the skin over the port site. Your health care provider will put on a mask and sterile gloves. The skin over your port will be cleaned carefully with a germ-killing soap and allowed to dry. Your health care provider will gently pinch the port and insert a needle into it. Your health care provider will check for a blood return to make sure the port is in the vein and is not clogged. If your port needs to remain accessed to get medicine continuously (constant infusion), your health care provider will place a clear bandage (dressing) over the needle site. The dressing and needle will need to be changed every week, or as told by your health care provider. What  is flushing? Flushing helps keep the port from getting clogged. Follow instructions from your health care provider about how and when to flush the port. Ports are usually flushed with saline solution or a medicine called heparin. The need for flushing will depend on how the port is used: If the port is only used from time to time to give medicines or draw blood, the port may need to be flushed: Before and after medicines have been given. Before and after blood has been drawn. As part of routine maintenance. Flushing may be recommended every 4-6 weeks. If a constant infusion is running, the port may not need to be flushed. Throw away any syringes in a disposal container that is meant for sharp items (sharps container). You can buy a sharps container from a pharmacy, or you can make one by using an empty hard plastic bottle with a cover. How long will my port stay implanted? The port can stay in for as long as your health care provider thinks it is needed. When it is time for the port to come out, a surgery will be done to remove it. The surgery will be similar to the procedure that was done to putthe port in. Follow these instructions at home:  Flush your port as told by your health care provider. If you need an infusion over several days, follow instructions from your health care provider about how to take   care of your port site. Make sure you: Wash your hands with soap and water before you change your dressing. If soap and water are not available, use alcohol-based hand sanitizer. Change your dressing as told by your health care provider. Place any used dressings or infusion bags into a plastic bag. Throw that bag in the trash. Keep the dressing that covers the needle clean and dry. Do not get it wet. Do not use scissors or sharp objects near the tube. Keep the tube clamped, unless it is being used. Check your port site every day for signs of infection. Check for: Redness, swelling, or  pain. Fluid or blood. Pus or a bad smell. Protect the skin around the port site. Avoid wearing bra straps that rub or irritate the site. Protect the skin around your port from seat belts. Place a soft pad over your chest if needed. Bathe or shower as told by your health care provider. The site may get wet as long as you are not actively receiving an infusion. Return to your normal activities as told by your health care provider. Ask your health care provider what activities are safe for you. Carry a medical alert card or wear a medical alert bracelet at all times. This will let health care providers know that you have an implanted port in case of an emergency. Get help right away if: You have redness, swelling, or pain at the port site. You have fluid or blood coming from your port site. You have pus or a bad smell coming from the port site. You have a fever. Summary Implanted ports are usually placed in the chest for long-term IV access. Follow instructions from your health care provider about flushing the port and changing bandages (dressings). Take care of the area around your port by avoiding clothing that puts pressure on the area, and by watching for signs of infection. Protect the skin around your port from seat belts. Place a soft pad over your chest if needed. Get help right away if you have a fever or you have redness, swelling, pain, drainage, or a bad smell at the port site. This information is not intended to replace advice given to you by your health care provider. Make sure you discuss any questions you have with your healthcare provider. Document Revised: 03/02/2020 Document Reviewed: 03/02/2020 Elsevier Patient Education  2022 Elsevier Inc.  

## 2021-06-16 DIAGNOSIS — M25512 Pain in left shoulder: Secondary | ICD-10-CM | POA: Diagnosis not present

## 2021-06-16 DIAGNOSIS — M25612 Stiffness of left shoulder, not elsewhere classified: Secondary | ICD-10-CM | POA: Diagnosis not present

## 2021-06-17 ENCOUNTER — Telehealth: Payer: Self-pay | Admitting: *Deleted

## 2021-06-17 MED ORDER — CIPROFLOXACIN HCL 500 MG PO TABS: 500.0000 mg | ORAL_TABLET | Freq: Two times a day (BID) | ORAL | 0 refills | Status: DC

## 2021-06-17 NOTE — Telephone Encounter (Signed)
Pt called to this RN per symptoms of pain with urination and urgency with some incontinence.  She denies any fevers or chills.  Per MD review prescription for Cipro sent to her verified pharmacy.  Of note discussed use of AZO standard for pain alleviation to use with the cipro.  Pt verbalized understanding.  Prescription sent per MD order.

## 2021-06-22 DIAGNOSIS — M25512 Pain in left shoulder: Secondary | ICD-10-CM | POA: Diagnosis not present

## 2021-06-22 DIAGNOSIS — M25612 Stiffness of left shoulder, not elsewhere classified: Secondary | ICD-10-CM | POA: Diagnosis not present

## 2021-06-25 MED FILL — Dexamethasone Sodium Phosphate Inj 100 MG/10ML: INTRAMUSCULAR | Qty: 1 | Status: AC

## 2021-06-25 MED FILL — Fosaprepitant Dimeglumine For IV Infusion 150 MG (Base Eq): INTRAVENOUS | Qty: 5 | Status: AC

## 2021-06-28 ENCOUNTER — Inpatient Hospital Stay: Payer: Medicare HMO

## 2021-06-28 ENCOUNTER — Other Ambulatory Visit: Payer: Self-pay

## 2021-06-28 ENCOUNTER — Ambulatory Visit: Payer: Medicare HMO

## 2021-06-28 ENCOUNTER — Other Ambulatory Visit: Payer: Medicare HMO

## 2021-06-28 VITALS — BP 176/75 | HR 60 | Temp 98.6°F | Resp 17 | Wt 129.5 lb

## 2021-06-28 DIAGNOSIS — C50412 Malignant neoplasm of upper-outer quadrant of left female breast: Secondary | ICD-10-CM

## 2021-06-28 DIAGNOSIS — Z5111 Encounter for antineoplastic chemotherapy: Secondary | ICD-10-CM | POA: Diagnosis not present

## 2021-06-28 DIAGNOSIS — Z95828 Presence of other vascular implants and grafts: Secondary | ICD-10-CM

## 2021-06-28 DIAGNOSIS — Z170 Estrogen receptor positive status [ER+]: Secondary | ICD-10-CM | POA: Diagnosis not present

## 2021-06-28 DIAGNOSIS — R110 Nausea: Secondary | ICD-10-CM | POA: Diagnosis not present

## 2021-06-28 DIAGNOSIS — Z79899 Other long term (current) drug therapy: Secondary | ICD-10-CM | POA: Diagnosis not present

## 2021-06-28 LAB — CBC WITH DIFFERENTIAL/PLATELET
Abs Immature Granulocytes: 0.03 10*3/uL (ref 0.00–0.07)
Basophils Absolute: 0.1 10*3/uL (ref 0.0–0.1)
Basophils Relative: 1 %
Eosinophils Absolute: 0.4 10*3/uL (ref 0.0–0.5)
Eosinophils Relative: 6 %
HCT: 37.4 % (ref 36.0–46.0)
Hemoglobin: 12.3 g/dL (ref 12.0–15.0)
Immature Granulocytes: 1 %
Lymphocytes Relative: 27 %
Lymphs Abs: 1.8 10*3/uL (ref 0.7–4.0)
MCH: 30.3 pg (ref 26.0–34.0)
MCHC: 32.9 g/dL (ref 30.0–36.0)
MCV: 92.1 fL (ref 80.0–100.0)
Monocytes Absolute: 0.8 10*3/uL (ref 0.1–1.0)
Monocytes Relative: 13 %
Neutro Abs: 3.4 10*3/uL (ref 1.7–7.7)
Neutrophils Relative %: 52 %
Platelets: 323 10*3/uL (ref 150–400)
RBC: 4.06 MIL/uL (ref 3.87–5.11)
RDW: 13.9 % (ref 11.5–15.5)
WBC: 6.5 10*3/uL (ref 4.0–10.5)
nRBC: 0 % (ref 0.0–0.2)

## 2021-06-28 LAB — COMPREHENSIVE METABOLIC PANEL
ALT: 13 U/L (ref 0–44)
AST: 17 U/L (ref 15–41)
Albumin: 3.6 g/dL (ref 3.5–5.0)
Alkaline Phosphatase: 99 U/L (ref 38–126)
Anion gap: 10 (ref 5–15)
BUN: 6 mg/dL — ABNORMAL LOW (ref 8–23)
CO2: 26 mmol/L (ref 22–32)
Calcium: 8.8 mg/dL — ABNORMAL LOW (ref 8.9–10.3)
Chloride: 109 mmol/L (ref 98–111)
Creatinine, Ser: 0.68 mg/dL (ref 0.44–1.00)
GFR, Estimated: >60 mL/min (ref >60)
Glucose, Bld: 110 mg/dL — ABNORMAL HIGH (ref 70–99)
Potassium: 4 mmol/L (ref 3.5–5.1)
Sodium: 145 mmol/L (ref 135–145)
Total Bilirubin: 0.2 mg/dL — ABNORMAL LOW (ref 0.3–1.2)
Total Protein: 6.8 g/dL (ref 6.5–8.1)

## 2021-06-28 MED ORDER — SODIUM CHLORIDE 0.9 % IV SOLN
150.0000 mg | Freq: Once | INTRAVENOUS | Status: AC
  Administered 2021-06-28: 150 mg via INTRAVENOUS
  Filled 2021-06-28: qty 150

## 2021-06-28 MED ORDER — SODIUM CHLORIDE 0.9% FLUSH
10.0000 mL | INTRAVENOUS | Status: DC | PRN
  Administered 2021-06-28: 10 mL

## 2021-06-28 MED ORDER — HEPARIN SOD (PORK) LOCK FLUSH 100 UNIT/ML IV SOLN
500.0000 [IU] | Freq: Once | INTRAVENOUS | Status: AC | PRN
  Administered 2021-06-28: 500 [IU]

## 2021-06-28 MED ORDER — SODIUM CHLORIDE 0.9 % IV SOLN
10.0000 mg | Freq: Once | INTRAVENOUS | Status: AC
  Administered 2021-06-28: 10 mg via INTRAVENOUS
  Filled 2021-06-28: qty 10

## 2021-06-28 MED ORDER — SODIUM CHLORIDE 0.9% FLUSH
10.0000 mL | Freq: Once | INTRAVENOUS | Status: AC
  Administered 2021-06-28: 10 mL

## 2021-06-28 MED ORDER — METHOTREXATE SODIUM (PF) CHEMO INJECTION 250 MG/10ML
65.0000 mg | Freq: Once | INTRAMUSCULAR | Status: AC
  Administered 2021-06-28: 65 mg via INTRAVENOUS
  Filled 2021-06-28: qty 2.6

## 2021-06-28 MED ORDER — SODIUM CHLORIDE 0.9 % IV SOLN
600.0000 mg/m2 | Freq: Once | INTRAVENOUS | Status: AC
  Administered 2021-06-28: 980 mg via INTRAVENOUS
  Filled 2021-06-28: qty 49

## 2021-06-28 MED ORDER — FLUOROURACIL CHEMO INJECTION 2.5 GM/50ML
600.0000 mg/m2 | Freq: Once | INTRAVENOUS | Status: AC
  Administered 2021-06-28: 950 mg via INTRAVENOUS
  Filled 2021-06-28: qty 19

## 2021-06-28 MED ORDER — SODIUM CHLORIDE 0.9 % IV SOLN: Freq: Once | INTRAVENOUS | Status: AC

## 2021-06-28 NOTE — Patient Instructions (Signed)
[facility]  Discharge Instructions: Thank you for choosing [facility] to provide your oncology and hematology care.   If you have a lab appointment with the [facility], please go directly to the [facility] and check in at the registration area.   Wear comfortable clothing and clothing appropriate for easy access to any Portacath or PICC line.   We strive to give you quality time with your provider. You may need to reschedule your appointment if you arrive late (15 or more minutes).  Arriving late affects you and other patients whose appointments are after yours.  Also, if you miss three or more appointments without notifying the office, you may be dismissed from the clinic at the provider's discretion.      For prescription refill requests, have your pharmacy contact our office and allow 72 hours for refills to be completed.    Today you received the following chemotherapy and/or immunotherapy agents: Cytoxan Methotrexate, Fluorouracil    To help prevent nausea and vomiting after your treatment, we encourage you to take your nausea medication as directed.  BELOW ARE SYMPTOMS THAT SHOULD BE REPORTED IMMEDIATELY: *FEVER GREATER THAN 100.4 F (38 C) OR HIGHER *CHILLS OR SWEATING *NAUSEA AND VOMITING THAT IS NOT CONTROLLED WITH YOUR NAUSEA MEDICATION *UNUSUAL SHORTNESS OF BREATH *UNUSUAL BRUISING OR BLEEDING *URINARY PROBLEMS (pain or burning when urinating, or frequent urination) *BOWEL PROBLEMS (unusual diarrhea, constipation, pain near the anus) TENDERNESS IN MOUTH AND THROAT WITH OR WITHOUT PRESENCE OF ULCERS (sore throat, sores in mouth, or a toothache) UNUSUAL RASH, SWELLING OR PAIN  UNUSUAL VAGINAL DISCHARGE OR ITCHING   Items with * indicate a potential emergency and should be followed up as soon as possible or go to the Emergency Department if any problems should occur.  Please show the CHEMOTHERAPY ALERT CARD or IMMUNOTHERAPY  ALERT CARD at check-in to the Emergency Department and triage nurse.  Should you have questions after your visit or need to cancel or reschedule your appointment, please contact [facility]  Dept: [phone]  and follow the prompts.  Office hours are 8:00 a.m. to 4:30 p.m. Monday - Friday. Please note that voicemails left after 4:00 p.m. may not be returned until the following business day.  We are closed weekends and major holidays. You have access to a nurse at all times for urgent questions. Please call the main number to the clinic Dept: [phone] and follow the prompts.   For any non-urgent questions, you may also contact your provider using MyChart. We now offer e-Visits for anyone 49 and older to request care online for non-urgent symptoms. For details visit [email or web address].   Also download the MyChart app! Go to the app store, search "MyChart", open the app, select [facility], and log in with your MyChart username and password.  Due to Covid, a mask is required upon entering the hospital/clinic. If you do not have a mask, one will be given to you upon arrival. For doctor visits, patients may have 1 support person aged 34 or older with them. For treatment visits, patients cannot have anyone with them due to current Covid guidelines and our immunocompromised population.

## 2021-07-01 DIAGNOSIS — M79641 Pain in right hand: Secondary | ICD-10-CM | POA: Diagnosis not present

## 2021-07-01 DIAGNOSIS — M65321 Trigger finger, right index finger: Secondary | ICD-10-CM | POA: Diagnosis not present

## 2021-07-01 DIAGNOSIS — M65331 Trigger finger, right middle finger: Secondary | ICD-10-CM | POA: Diagnosis not present

## 2021-07-05 NOTE — Progress Notes (Signed)
[place]  Telephone:([phone]) [phone] Fax:(336) [phone]     ID: Gina Hunt DOB: Oct 11, 1946  MR#: [id number]  CZY#:[id number]  Patient Care Team: [name], DO as PCP - General (Family Medicine) Rockwell Germany, RN as Oncology Nurse Navigator Mauro Kaufmann, RN as Oncology Nurse Navigator Rolm Bookbinder, MD as Consulting Physician (General Surgery) Katelen Luepke, [name], MD as Consulting Physician (Oncology) Kyung Rudd, MD as Consulting Physician (Radiation Oncology) Sheryn Bison, MD as Consulting Physician (Dermatology) Chauncey Cruel, MD OTHER MD:   CHIEF COMPLAINT: Estrogen receptor positive breast cancer (s/p left mastectomy)  CURRENT TREATMENT: Adjuvant chemotherapy   INTERVAL HISTORY: Gina Hunt returns today for follow up and treatment of her estrogen receptor positive breast cancer.  She is accompanied by her niece Gina Hunt began adjuvant CMF on 05/17/2021.  We generally see her a week after treatment to check labs and side effects.  Today is day 8 cycle 3.  She tells me she has noted a change in her left chest wall and she wanted me to examine it today   REVIEW OF SYSTEMS: Gina Hunt is tolerating chemotherapy generally better.  She has lost a little hair but it is not particularly noticeable.  She continues to have some burning feelings in her mouth.  She no longer has mouth sores.  Even cold drinks make her mouth feel like that and she tells me she is drinking a lot of water because her mouth is also very dry, especially at night.  She has had no significant problems with diarrhea or constipation, cough phlegm production or pleurisy.  She is walking a mile every morning and she is starting to walk her dogs in the evening.  Detailed review of systems today was otherwise stable   COVID 19 VACCINATION STATUS: [place] x2, no booster as of 12/2020   HISTORY OF CURRENT ILLNESS: From the original intake note:  Gina Hunt (pronounced  "Erlene Quan") had routine screening mammography on 11/24/2020 showing a possible abnormality in the left breast. She underwent left breast ultrasonography at [facility] on 12/02/2020 showing: breast density category A; 2.5 cm mass in left breast at 2 o'clock (measuring 1.7 cm in ultrasound); no abnormal left axillary lymph nodes.  Accordingly on 12/08/2020 she proceeded to biopsy of the left breast area in question. The pathology from this procedure ([id number]) showed: invasive ductal carcinoma, grade 3. Prognostic indicators significant for: estrogen receptor, 95% positive with strong staining intensity and progesterone receptor, 80% positive with moderate staining intensity. Proliferation marker Ki67 at 30%. HER2 equivocal by immunohistochemistry (2+), but negative by fluorescent in situ hybridization with a signals ratio 1.64 and number per cell 2.95.  Cancer Staging Malignant neoplasm of upper-outer quadrant of left breast in female, estrogen receptor positive ([place]) Staging form: Breast, AJCC 8th Edition - Clinical stage from 12/16/2020: Stage IIA (cT2, cN0, cM0, G3, ER+, PR+, HER2-) - Signed by Chauncey Cruel, MD on 12/16/2020 Stage prefix: Initial diagnosis  The patient's subsequent history is as detailed below.   PAST MEDICAL HISTORY: Past Medical History:  Diagnosis Date   Anxiety    Breast cancer ([place]) 01/2021   left breast IDC   Complication of anesthesia    Family history of breast cancer 12/16/2020   Family history of gastric cancer 12/16/2020   Fibromyalgia    GERD (gastroesophageal reflux disease)    Hyperlipidemia    Hypothyroidism    Osteoarthritis    knees, hands, fingers   Osteoarthritis    PONV (  postoperative nausea and vomiting)    Skin cancer     PAST SURGICAL HISTORY: Past Surgical History:  Procedure Laterality Date   ABDOMINAL HYSTERECTOMY     COLONOSCOPY  11/24/2015   EVACUATION BREAST HEMATOMA Left 04/07/2021   Procedure: EVACUATION HEMATOMA BREAST;  Surgeon:  Rolm Bookbinder, MD;  Location: [place];  Service: General;  Laterality: Left;   LIVER SURGERY     PORTA CATH INSERTION Right 04/07/2021   Procedure: PORTA CATH INSERTION;  Surgeon: Rolm Bookbinder, MD;  Location: [place];  Service: General;  Laterality: Right;   SIMPLE MASTECTOMY WITH AXILLARY SENTINEL NODE BIOPSY Left 04/07/2021   Procedure: LEFT SIMPLE MASTECTOMY WITH LEFT AXILLARY SENTINEL NODE BIOPSY;  Surgeon: Rolm Bookbinder, MD;  Location: [place];  Service: General;  Laterality: Left;   TONSILLECTOMY      FAMILY HISTORY: Family History  Problem Relation Age of Onset   Hypertension Mother    Diabetes Mother    Arthritis Mother    Heart disease Mother    Rheum arthritis Father    Diabetes Father    Heart disease Father    Stomach cancer Brother 57   Stomach cancer Brother 31   Esophageal cancer Brother 37   Breast cancer Maternal Aunt 70   Breast cancer Maternal Aunt 72   Breast cancer Maternal Aunt 73   Colon cancer Neg Hx    Colon polyps Neg Hx   Her father and mother both died from MI, father age 80 and mother age 4. Azoria has 6 brothers and 1 sister. She reports breast cancer in 3 maternal aunts, one in their 16's. One of her maternal aunt's daughter (Gina Hunt's cousin) had breast cancer at age 21.The patient also reports stomach cancer in two of her brothers in their 34's, one of whom also had esophageal cancer.    GYNECOLOGIC HISTORY:  No LMP recorded. Patient has had a hysterectomy. Menarche: 75 years old Age at first live birth: 75 years old [place] P 1 LMP age 18 Contraceptive: never used HRT never used  Hysterectomy? yes BSO? yes   SOCIAL HISTORY: (updated 12/2020)  Gina Hunt is currently retired from working at the [employer] in [place] died late February 2229 from complications of Agent Orange exposure. She lives at home alone. Daughter Gina Hunt lives in [place], the patient  believes, but they have been alienated for many years.    ADVANCED DIRECTIVES: not in place; intends to name her niece, Gina Hunt, as her HCPOA.  The patient was given the appropriate documents to complete and notarized at her discretion on her 12/16/2020 visit   HEALTH MAINTENANCE: Social History   Tobacco Use   Smoking status: Never   Smokeless tobacco: Never  Vaping Use   Vaping Use: Never used  Substance Use Topics   Alcohol use: Yes    Comment: rarely    Drug use: Never     Colonoscopy: 05/2019 (Dr. Bryan Lemma), recall 2027  PAP: "10 years ago" (~2012)  Bone density: 2020   Allergies  Allergen Reactions   Codeine Other (See Comments)    Hallucination    Morphine And Related Other (See Comments)    Knocks me out per pt    Current Outpatient Medications  Medication Sig Dispense Refill   ciprofloxacin (CIPRO) 500 MG tablet Take 1 tablet (500 mg total) by mouth 2 (two) times daily. 6 tablet 0   dexamethasone (DECADRON) 4 MG tablet Take 2 tablets (  8 mg total) by mouth 2 (two) times daily. Start the day after chemotherapy for 2 days. Take with food. 30 tablet 1   DULoxetine (CYMBALTA) 60 MG capsule Take 1 capsule (60 mg total) by mouth 2 (two) times daily. 180 capsule 2   gabapentin (NEURONTIN) 300 MG capsule Take 1 capsule (300 mg total) by mouth 3 (three) times daily. 270 capsule 1   levothyroxine (SYNTHROID) 50 MCG tablet Take 1 tablet (50 mcg total) by mouth daily before breakfast. 90 tablet 2   lidocaine-prilocaine (EMLA) cream Apply to affected area once 30 g 3   methocarbamol (ROBAXIN) 500 MG tablet Take 1 tablet (500 mg total) by mouth every 6 (six) hours as needed for muscle spasms. 20 tablet 0   omeprazole (PRILOSEC) 40 MG capsule Take 1 capsule (40 mg total) by mouth daily. 90 capsule 2   prochlorperazine (COMPAZINE) 10 MG tablet Take 1 tablet (10 mg total) by mouth every 6 (six) hours as needed (Nausea or vomiting). 30 tablet 1   simvastatin (ZOCOR) 40 MG tablet  Take 1 tablet (40 mg total) by mouth daily. 90 tablet 2   valACYclovir (VALTREX) 1000 MG tablet Take 1 tablet (1,000 mg total) by mouth 2 (two) times daily. 90 tablet 2   No current facility-administered medications for this visit.    OBJECTIVE: White woman who appears stated age  4:   07/06/21 1200  BP: (!) 159/74  Pulse: 70  Temp: 97.7 F (36.5 C)  SpO2: 99%     Body mass index is 23.72 kg/m.   Wt Readings from Last 3 Encounters:  07/06/21 129 lb 11.2 oz (58.8 kg)  06/28/21 129 lb 8 oz (58.7 kg)  06/14/21 131 lb 8 oz (59.6 kg)     ECOG FS:1 - Symptomatic but completely ambulatory  Sclerae unicteric, EOMs intact Wearing a mask No cervical or supraclavicular adenopathy Lungs no rales or rhonchi Heart regular rate and rhythm Abd soft, nontender, positive bowel sounds MSK no focal spinal tenderness, no upper extremity lymphedema Neuro: nonfocal, well oriented, appropriate affect Breasts: The right breast is benign as are both axillae.  The left breast is status post mastectomy.  The incision has healed very nicely and overall the incision is very flat, which is optimal.  About 4 cm lateral to the vertical portion of the incision and superior to the horizontal portion of the incision there is a 1 cm movable nontender nonerythematous mass with no obvious pore or insect bite mark.  There are no other findings of concern.   LAB RESULTS:  CMP     Component Value Date/Time   NA 145 06/28/2021 1331   K 4.0 06/28/2021 1331   CL 109 06/28/2021 1331   CO2 26 06/28/2021 1331   GLUCOSE 110 (H) 06/28/2021 1331   BUN 6 (L) 06/28/2021 1331   CREATININE 0.68 06/28/2021 1331   CREATININE 0.72 04/27/2021 1236   CREATININE 0.58 (L) 12/20/2019 1422   CALCIUM 8.8 (L) 06/28/2021 1331   PROT 6.8 06/28/2021 1331   ALBUMIN 3.6 06/28/2021 1331   AST 17 06/28/2021 1331   AST 15 04/27/2021 1236   ALT 13 06/28/2021 1331   ALT 11 04/27/2021 1236   ALKPHOS 99 06/28/2021 1331   BILITOT  0.2 (L) 06/28/2021 1331   BILITOT 0.3 04/27/2021 1236   GFRNONAA >60 06/28/2021 1331   GFRNONAA >60 04/27/2021 1236    No results found for: TOTALPROTELP, ALBUMINELP, A1GS, A2GS, BETS, BETA2SER, GAMS, MSPIKE, SPEI  Lab Results  Component Value Date   WBC 5.8 07/06/2021   NEUTROABS 3.9 07/06/2021   HGB 11.9 (L) 07/06/2021   HCT 36.2 07/06/2021   MCV 91.4 07/06/2021   PLT 195 07/06/2021    No results found for: LABCA2  No components found for: [id number]  No results for input(s): INR in the last 168 hours.  No results found for: LABCA2  No results found for: XHB716  No results found for: RCV893  No results found for: YBO175  No results found for: CA2729  No components found for: HGQUANT  No results found for: CEA1 / No results found for: CEA1   No results found for: AFPTUMOR  No results found for: CHROMOGRNA  No results found for: KPAFRELGTCHN, LAMBDASER, KAPLAMBRATIO (kappa/lambda light chains)  No results found for: HGBA, HGBA2QUANT, HGBFQUANT, HGBSQUAN (Hemoglobinopathy evaluation)   No results found for: LDH  No results found for: IRON, TIBC, IRONPCTSAT (Iron and TIBC)  No results found for: FERRITIN  Urinalysis No results found for: COLORURINE, APPEARANCEUR, LABSPEC, PHURINE, GLUCOSEU, HGBUR, BILIRUBINUR, KETONESUR, PROTEINUR, UROBILINOGEN, NITRITE, LEUKOCYTESUR   STUDIES: No results found.   ELIGIBLE FOR AVAILABLE RESEARCH PROTOCOL: no  ASSESSMENT: 75 y.o. [place], [place] woman status post left breast upper outer quadrant biopsy 12/08/2020 for a clinical T2N0, stage IIA invasive ductal carcinoma, grade 3, estrogen and progesterone receptor positive, HER-2 not amplified, with an MIB-1 of 30%  (1) Oncotype obtained from the original biopsy shows a score of 32, predicting a risk of recurrence outside the breast in the next 9 years of 20% if node-negative, 25% if node positive, if antiestrogens are the only systemic treatment, also predicting a greater  than 15% benefit from chemotherapy  (2) genetics testing 01/01/2021 through the [facility] CustomNext-Cancer +RNAinsight Panel found no deleterious mutations in APC, ATM, AXIN2, BARD1, BMPR1A, BRCA1, BRCA2, BRIP1, CDH1, CDK4, CDKN2A, CHEK2, DICER1, EPCAM, GREM1, HOXB13, MEN1, MLH1, MSH2, MSH3, MSH6, MUTYH, NBN, NF1, NF2, NTHL1, PALB2, PMS2, POLD1, POLE, PTEN, RAD51C, RAD51D, RECQL, RET, SDHA, SDHAF2, SDHB, SDHC, SDHD, SMAD4, SMARCA4, STK11, TP53, TSC1, TSC2, and VHL.  RNA data is routinely analyzed for use in variant interpretation for all genes.  (3) anastrozole started neoadjuvantly 12/16/2020, discontinued 03/2021 with multiple side effect  (4) status post left mastectomy and sentinel lymph node sampling 04/07/2021 for a pT2 pN0, stage IIA invasive ductal carcinoma, grade 2, with negative margins  (A) a single left axilla lymph node removed  (B) repeat prognostic panel finds the cancer estrogen receptor positive, progesterone receptor and HER2 negative.  (5) adjuvant chemotherapy (CMF) starting 05/17/2021, to be repeated times  (6) adjuvant tamoxifen to follow   PLAN: Gina Hunt is tolerating CMF well and I think from this point we can start seeing her on the day of treatment and save her the day 8 visit.  As far as her mouth is concerned I gave her a prescription to make her own saline so she can swish and gargle and spit.    I am not sure what the bump I am feeling in her left chest wall is.  It certainly is not typical for a local recurrence and it has come up rather quickly.  It is likely inflammatory but again it is not hot or tender and there is no evidence of a bite.  I am sending her to her surgeon for further evaluation.  Otherwise she will return to see me in 2 weeks for her fourth cycle of chemotherapy.  Total encounter time 25 minutes.Sarajane Jews C. Mikayah Joy,  MD 07/06/2021 12:18 PM Medical Oncology and Hematology [facility] [place], [place] [address] Tel.  [phone]    Fax. [phone]   This document serves as a record of services personally performed by Lurline Del, MD. It was created on his behalf by Wilburn Mylar, a trained medical scribe. The creation of this record is based on the scribe's personal observations and the provider's statements to them.   I, Lurline Del MD, have reviewed the above documentation for accuracy and completeness, and I agree with the above.   *Total Encounter Time as defined by the Centers for Medicare and Medicaid Services includes, in addition to the face-to-face time of a patient visit (documented in the note above) non-face-to-face time: obtaining and reviewing outside history, ordering and reviewing medications, tests or procedures, care coordination (communications with other health care professionals or caregivers) and documentation in the medical record.

## 2021-07-06 ENCOUNTER — Other Ambulatory Visit: Payer: Self-pay

## 2021-07-06 ENCOUNTER — Inpatient Hospital Stay: Payer: Medicare HMO | Attending: Oncology

## 2021-07-06 ENCOUNTER — Inpatient Hospital Stay: Payer: Medicare HMO | Admitting: Oncology

## 2021-07-06 VITALS — BP 159/74 | HR 70 | Temp 97.7°F | Wt 129.7 lb

## 2021-07-06 DIAGNOSIS — Z170 Estrogen receptor positive status [ER+]: Secondary | ICD-10-CM | POA: Diagnosis not present

## 2021-07-06 DIAGNOSIS — Z5111 Encounter for antineoplastic chemotherapy: Secondary | ICD-10-CM | POA: Diagnosis not present

## 2021-07-06 DIAGNOSIS — C50412 Malignant neoplasm of upper-outer quadrant of left female breast: Secondary | ICD-10-CM | POA: Insufficient documentation

## 2021-07-06 DIAGNOSIS — Z95828 Presence of other vascular implants and grafts: Secondary | ICD-10-CM

## 2021-07-06 LAB — CBC WITH DIFFERENTIAL/PLATELET
Abs Immature Granulocytes: 0.06 10*3/uL (ref 0.00–0.07)
Basophils Absolute: 0.1 10*3/uL (ref 0.0–0.1)
Basophils Relative: 1 %
Eosinophils Absolute: 0.3 10*3/uL (ref 0.0–0.5)
Eosinophils Relative: 5 %
HCT: 36.2 % (ref 36.0–46.0)
Hemoglobin: 11.9 g/dL — ABNORMAL LOW (ref 12.0–15.0)
Immature Granulocytes: 1 %
Lymphocytes Relative: 20 %
Lymphs Abs: 1.1 10*3/uL (ref 0.7–4.0)
MCH: 30.1 pg (ref 26.0–34.0)
MCHC: 32.9 g/dL (ref 30.0–36.0)
MCV: 91.4 fL (ref 80.0–100.0)
Monocytes Absolute: 0.3 10*3/uL (ref 0.1–1.0)
Monocytes Relative: 6 %
Neutro Abs: 3.9 10*3/uL (ref 1.7–7.7)
Neutrophils Relative %: 67 %
Platelets: 195 10*3/uL (ref 150–400)
RBC: 3.96 MIL/uL (ref 3.87–5.11)
RDW: 13.8 % (ref 11.5–15.5)
WBC: 5.8 10*3/uL (ref 4.0–10.5)
nRBC: 0 % (ref 0.0–0.2)

## 2021-07-06 LAB — COMPREHENSIVE METABOLIC PANEL
ALT: 13 U/L (ref 0–44)
AST: 12 U/L — ABNORMAL LOW (ref 15–41)
Albumin: 3.6 g/dL (ref 3.5–5.0)
Alkaline Phosphatase: 95 U/L (ref 38–126)
Anion gap: 8 (ref 5–15)
BUN: 10 mg/dL (ref 8–23)
CO2: 25 mmol/L (ref 22–32)
Calcium: 8.5 mg/dL — ABNORMAL LOW (ref 8.9–10.3)
Chloride: 108 mmol/L (ref 98–111)
Creatinine, Ser: 0.66 mg/dL (ref 0.44–1.00)
GFR, Estimated: >60 mL/min (ref >60)
Glucose, Bld: 152 mg/dL — ABNORMAL HIGH (ref 70–99)
Potassium: 3.7 mmol/L (ref 3.5–5.1)
Sodium: 141 mmol/L (ref 135–145)
Total Bilirubin: 0.2 mg/dL — ABNORMAL LOW (ref 0.3–1.2)
Total Protein: 6.5 g/dL (ref 6.5–8.1)

## 2021-07-06 MED ORDER — HEPARIN SOD (PORK) LOCK FLUSH 100 UNIT/ML IV SOLN
500.0000 [IU] | Freq: Once | INTRAVENOUS | Status: AC
  Administered 2021-07-06: 500 [IU]

## 2021-07-06 MED ORDER — SODIUM CHLORIDE 0.9% FLUSH
10.0000 mL | Freq: Once | INTRAVENOUS | Status: AC
  Administered 2021-07-06: 10 mL

## 2021-07-09 ENCOUNTER — Telehealth: Payer: Self-pay | Admitting: *Deleted

## 2021-07-09 NOTE — Telephone Encounter (Signed)
This RN contacted Dr Cristal Generous office per MD note to request an appt for follow up on new nodule.  Informed pt is scheduled on 07/13/2021 already.  No further needs.

## 2021-07-12 ENCOUNTER — Ambulatory Visit: Payer: Medicare HMO | Attending: General Surgery

## 2021-07-12 ENCOUNTER — Other Ambulatory Visit: Payer: Self-pay

## 2021-07-12 VITALS — Wt 128.0 lb

## 2021-07-12 DIAGNOSIS — Z483 Aftercare following surgery for neoplasm: Secondary | ICD-10-CM | POA: Insufficient documentation

## 2021-07-12 NOTE — Therapy (Signed)
[place], [place], [address] Phone: [phone]   Fax:  [phone]  Physical Therapy Treatment  Patient Details  Name: Gina Hunt MRN: [id number] Date of Birth: April 14, 1946 Referring Provider (PT): Dr. Rolm Bookbinder   Encounter Date: 07/12/2021   PT End of Session - 07/12/21 1553     Visit Number 2   # unchanged due to screen only   Number of Visits 2    PT Start Time 1546    PT Stop Time 5638    PT Time Calculation (min) 8 min    Activity Tolerance Patient tolerated treatment well    Behavior During Therapy [facility] for tasks assessed/performed             Past Medical History:  Diagnosis Date   Anxiety    Breast cancer ([place]) 01/2021   left breast IDC   Complication of anesthesia    Family history of breast cancer 12/16/2020   Family history of gastric cancer 12/16/2020   Fibromyalgia    GERD (gastroesophageal reflux disease)    Hyperlipidemia    Hypothyroidism    Osteoarthritis    knees, hands, fingers   Osteoarthritis    PONV (postoperative nausea and vomiting)    Skin cancer     Past Surgical History:  Procedure Laterality Date   ABDOMINAL HYSTERECTOMY     COLONOSCOPY  11/24/2015   EVACUATION BREAST HEMATOMA Left 04/07/2021   Procedure: EVACUATION HEMATOMA BREAST;  Surgeon: Rolm Bookbinder, MD;  Location: [place];  Service: General;  Laterality: Left;   LIVER SURGERY     PORTA CATH INSERTION Right 04/07/2021   Procedure: PORTA CATH INSERTION;  Surgeon: Rolm Bookbinder, MD;  Location: [place];  Service: General;  Laterality: Right;   SIMPLE MASTECTOMY WITH AXILLARY SENTINEL NODE BIOPSY Left 04/07/2021   Procedure: LEFT SIMPLE MASTECTOMY WITH LEFT AXILLARY SENTINEL NODE BIOPSY;  Surgeon: Rolm Bookbinder, MD;  Location: [place];  Service: General;  Laterality: Left;   TONSILLECTOMY      Vitals:   07/12/21 1547   Weight: 128 lb (58.1 kg)     Subjective Assessment - 07/12/21 1547     Subjective Pt returns for her 3 month L-Dex screen.    Pertinent History Patient was diagnosed on 11/24/2020 with left grade III invasive ductal carcinoma breast cancer. She underwent a left mastectomy and sentinel node biopsy (1 negative node) on 04/07/2021. It is ER/PR positive and HER2 negative with a Ki67 of 30%.                    L-DEX FLOWSHEETS - 07/12/21 1500       L-DEX LYMPHEDEMA SCREENING   Measurement Type Unilateral    L-DEX MEASUREMENT EXTREMITY Upper Extremity    POSITION  Standing    DOMINANT SIDE Right    At Risk Side Left    BASELINE SCORE (UNILATERAL) -5.3    L-DEX SCORE (UNILATERAL) -4.9    VALUE CHANGE (UNILAT) 0.4                                     PT Long Term Goals - 04/26/21 1222       PT LONG TERM GOAL #1   Title Patient will demonstrate she has regained full shoulder ROM and function post operatively compared to baselines.    Time 8  Period Weeks    Status Not Met                   Plan - 07/12/21 1554     Clinical Impression Statement Pt returns for her 3 month L-Dex screen. Her change from baseline of 0.4 is WNLs so no further treatment is required at this time except to cont every 3 month L-Dex screens which pt is agreeable to.    PT Next Visit Plan Cont every 3 month L-Dex screens for up to 2 years from her SLNB.    Consulted and Agree with Plan of Care Patient             Patient will benefit from skilled therapeutic intervention in order to improve the following deficits and impairments:     Visit Diagnosis: Aftercare following surgery for neoplasm     Problem List Patient Active Problem List   Diagnosis Date Noted   Port-A-Cath in place 05/26/2021   Genetic testing 12/29/2020   Family history of breast cancer 12/16/2020   Family history of gastric cancer 12/16/2020   Malignant neoplasm of upper-outer  quadrant of left breast in female, estrogen receptor positive ([place]) 12/14/2020   DNR (do not resuscitate) 12/20/2019   Hyperlipidemia 11/12/2018   Stress incontinence 10/12/2018   Other fatigue 05/19/2017   Dyslipidemia 03/08/2017   History of peptic ulcer 03/06/2017   History of hypothyroidism 03/06/2017   Fibromyalgia 09/06/2016   Primary osteoarthritis of both knees 09/06/2016   Primary osteoarthritis of both hands 09/06/2016   DJD (degenerative joint disease), cervical 09/06/2016   Peptic ulcer disease 09/06/2016   Hypothyroidism 09/06/2016    Otelia Limes, PTA 07/12/2021, 3:56 PM  [place], [place], [address] Phone: [phone]   Fax:  [phone]  Name: SIOMARA BURKEL MRN: [id number] Date of Birth: 09-04-1946

## 2021-07-13 DIAGNOSIS — R222 Localized swelling, mass and lump, trunk: Secondary | ICD-10-CM | POA: Diagnosis not present

## 2021-07-16 DIAGNOSIS — M25612 Stiffness of left shoulder, not elsewhere classified: Secondary | ICD-10-CM | POA: Diagnosis not present

## 2021-07-16 DIAGNOSIS — M25512 Pain in left shoulder: Secondary | ICD-10-CM | POA: Diagnosis not present

## 2021-07-16 MED FILL — Fosaprepitant Dimeglumine For IV Infusion 150 MG (Base Eq): INTRAVENOUS | Qty: 5 | Status: AC

## 2021-07-16 MED FILL — Dexamethasone Sodium Phosphate Inj 100 MG/10ML: INTRAMUSCULAR | Qty: 1 | Status: AC

## 2021-07-18 NOTE — Progress Notes (Signed)
[facility]  [phone] Fax:([phone]) [phone]     ID: ITXEL WICKARD DOB: 03/26/1946  MR#: [id number]  SFK#:[id number]  Patient Care Team: [name], DO as PCP - General (Family Medicine) Rockwell Germany, RN as Oncology Nurse Navigator Mauro Kaufmann, RN as Oncology Nurse Navigator Rolm Bookbinder, MD as Consulting Physician (General Surgery) Mike Hamre, [name], MD as Consulting Physician (Oncology) Kyung Rudd, MD as Consulting Physician (Radiation Oncology) Sheryn Bison, MD as Consulting Physician (Dermatology) Chauncey Cruel, MD OTHER MD:   CHIEF COMPLAINT: Estrogen receptor positive breast cancer (s/p left mastectomy)  CURRENT TREATMENT: Adjuvant chemotherapy   INTERVAL HISTORY: Carlei returns today for follow up and treatment of her estrogen receptor positive breast cancer.  She is accompanied by her niece Antania Hoefling began adjuvant CMF on 05/17/2021.  Initially we saw her her a week after treatment to check labs and side effects.  She is now in a very stable pattern and we see her on the first day of each cycle.  Today is day 1 cycle 4.  At the last visit I noted what appeared to be a new irregularity on her chest wall.  She was referred back to surgery.  Dr. Donne Hazel evaluated the patient and feels this is very likely a cyst.  He suggested an ultrasound to confirm.  I do not find a report or a scheduled ultrasound visit.   REVIEW OF SYSTEMS: Heavan is doing fine, just had her booster shot last week with no complications.  She has developed a rash in the dorsum of the left forearm particularly but also a little bit on her scalp and ankles.  This is itchy.  She is using hydrocortisone cream for it.  A detailed review of systems today was otherwise stable   COVID 19 VACCINATION STATUS: [place] x2, booster September 2022  HISTORY OF CURRENT ILLNESS: From the original intake note:  [name] had  routine screening mammography on 11/24/2020 showing a possible abnormality in the left breast. She underwent left breast ultrasonography at [facility] on 12/02/2020 showing: breast density category A; 2.5 cm mass in left breast at 2 o'clock (measuring 1.7 cm in ultrasound); no abnormal left axillary lymph nodes.  Accordingly on 12/08/2020 she proceeded to biopsy of the left breast area in question. The pathology from this procedure ([id number]) showed: invasive ductal carcinoma, grade 3. Prognostic indicators significant for: estrogen receptor, 95% positive with strong staining intensity and progesterone receptor, 80% positive with moderate staining intensity. Proliferation marker Ki67 at 30%. HER2 equivocal by immunohistochemistry (2+), but negative by fluorescent in situ hybridization with a signals ratio 1.64 and number per cell 2.95.  Cancer Staging Malignant neoplasm of upper-outer quadrant of left breast in female, estrogen receptor positive ([place]) Staging form: Breast, AJCC 8th Edition - Clinical stage from 12/16/2020: Stage IIA (cT2, cN0, cM0, G3, ER+, PR+, HER2-) - Signed by Chauncey Cruel, MD on 12/16/2020 Stage prefix: Initial diagnosis  The patient's subsequent history is as detailed below.   PAST MEDICAL HISTORY: Past Medical History:  Diagnosis Date   Anxiety    Breast cancer ([place]) 01/2021   left breast IDC   Complication of anesthesia    Family history of breast cancer 12/16/2020   Family history of gastric cancer 12/16/2020   Fibromyalgia    GERD (gastroesophageal reflux disease)    Hyperlipidemia    Hypothyroidism    Osteoarthritis    knees, hands, fingers   Osteoarthritis  PONV (postoperative nausea and vomiting)    Skin cancer     PAST SURGICAL HISTORY: Past Surgical History:  Procedure Laterality Date   ABDOMINAL HYSTERECTOMY     COLONOSCOPY  11/24/2015   EVACUATION BREAST HEMATOMA Left 04/07/2021   Procedure: EVACUATION HEMATOMA BREAST;  Surgeon: Rolm Bookbinder,  MD;  Location: [facility];  Service: General;  Laterality: Left;   LIVER SURGERY     PORTA CATH INSERTION Right 04/07/2021   Procedure: PORTA CATH INSERTION;  Surgeon: Rolm Bookbinder, MD;  Location: [facility];  Service: General;  Laterality: Right;   SIMPLE MASTECTOMY WITH AXILLARY SENTINEL NODE BIOPSY Left 04/07/2021   Procedure: LEFT SIMPLE MASTECTOMY WITH LEFT AXILLARY SENTINEL NODE BIOPSY;  Surgeon: Rolm Bookbinder, MD;  Location: [facility];  Service: General;  Laterality: Left;   TONSILLECTOMY      FAMILY HISTORY: Family History  Problem Relation Age of Onset   Hypertension Mother    Diabetes Mother    Arthritis Mother    Heart disease Mother    Rheum arthritis Father    Diabetes Father    Heart disease Father    Stomach cancer Brother 43   Stomach cancer Brother 34   Esophageal cancer Brother 9   Breast cancer Maternal Aunt 70   Breast cancer Maternal Aunt 72   Breast cancer Maternal Aunt 73   Colon cancer Neg Hx    Colon polyps Neg Hx   Her father and mother both died from MI, father age 27 and mother age 37. Adreona has 6 brothers and 1 sister. She reports breast cancer in 3 maternal aunts, one in their 26's. One of her maternal aunt's daughter (Xee's cousin) had breast cancer at age 59.The patient also reports stomach cancer in two of her brothers in their 60's, one of whom also had esophageal cancer.    GYNECOLOGIC HISTORY:  No LMP recorded. Patient has had a hysterectomy. Menarche: 75 years old Age at first live birth: 75 years old [place] P 1 LMP age 71 Contraceptive: never used HRT never used  Hysterectomy? yes BSO? yes   SOCIAL HISTORY: (updated 12/2020)  Alexy is currently retired from working at the [employer] in [place] died late February 7673 from complications of Agent Orange exposure. She lives at home alone. Daughter [name] lives in [place], the patient believes, but they  have been alienated for many years.    ADVANCED DIRECTIVES: not in place; intends to name her niece, [name], as her HCPOA.  The patient was given the appropriate documents to complete and notarized at her discretion on her 12/16/2020 visit   HEALTH MAINTENANCE: Social History   Tobacco Use   Smoking status: Never   Smokeless tobacco: Never  Vaping Use   Vaping Use: Never used  Substance Use Topics   Alcohol use: Yes    Comment: rarely    Drug use: Never     Colonoscopy: 05/2019 (Dr. Bryan Lemma), recall 2027  PAP: "10 years ago" (~2012)  Bone density: 2020   Allergies  Allergen Reactions   Codeine Other (See Comments)    Hallucination    Morphine And Related Other (See Comments)    Knocks me out per pt    Current Outpatient Medications  Medication Sig Dispense Refill   ciprofloxacin (CIPRO) 500 MG tablet Take 1 tablet (500 mg total) by mouth 2 (two) times daily. 6 tablet 0   dexamethasone (DECADRON) 4 MG tablet Take 2  tablets (8 mg total) by mouth 2 (two) times daily. Start the day after chemotherapy for 2 days. Take with food. 30 tablet 1   DULoxetine (CYMBALTA) 60 MG capsule Take 1 capsule (60 mg total) by mouth 2 (two) times daily. 180 capsule 2   gabapentin (NEURONTIN) 300 MG capsule Take 1 capsule (300 mg total) by mouth 3 (three) times daily. 270 capsule 1   levothyroxine (SYNTHROID) 50 MCG tablet Take 1 tablet (50 mcg total) by mouth daily before breakfast. 90 tablet 2   lidocaine-prilocaine (EMLA) cream Apply to affected area once 30 g 3   methocarbamol (ROBAXIN) 500 MG tablet Take 1 tablet (500 mg total) by mouth every 6 (six) hours as needed for muscle spasms. 20 tablet 0   omeprazole (PRILOSEC) 40 MG capsule Take 1 capsule (40 mg total) by mouth daily. 90 capsule 2   prochlorperazine (COMPAZINE) 10 MG tablet Take 1 tablet (10 mg total) by mouth every 6 (six) hours as needed (Nausea or vomiting). 30 tablet 1   simvastatin (ZOCOR) 40 MG tablet Take 1 tablet (40  mg total) by mouth daily. 90 tablet 2   valACYclovir (VALTREX) 1000 MG tablet Take 1 tablet (1,000 mg total) by mouth 2 (two) times daily. 90 tablet 2   No current facility-administered medications for this visit.    OBJECTIVE: White woman who appears stated age  There were no vitals filed for this visit.    There is no height or weight on file to calculate BMI.   Wt Readings from Last 3 Encounters:  07/12/21 128 lb (58.1 kg)  07/06/21 129 lb 11.2 oz (58.8 kg)  06/28/21 129 lb 8 oz (58.7 kg)     ECOG FS:1 - Symptomatic but completely ambulatory  Sclerae unicteric, EOMs intact Wearing a mask No cervical or supraclavicular adenopathy Lungs no rales or rhonchi Heart regular rate and rhythm Abd soft, nontender, positive bowel sounds MSK no focal spinal tenderness, no upper extremity lymphedema Neuro: nonfocal, well oriented, appropriate affect Breasts: The right breast is benign.  The left breast is status post mastectomy and the 1 cm movable nontender nonerythematous mass about 4 cm lateral to the vertical portion of the incision is unchanged.  Both axillae are benign.   LAB RESULTS:  CMP     Component Value Date/Time   NA 141 07/06/2021 1144   K 3.7 07/06/2021 1144   CL 108 07/06/2021 1144   CO2 25 07/06/2021 1144   GLUCOSE 152 (H) 07/06/2021 1144   BUN 10 07/06/2021 1144   CREATININE 0.66 07/06/2021 1144   CREATININE 0.72 04/27/2021 1236   CREATININE 0.58 (L) 12/20/2019 1422   CALCIUM 8.5 (L) 07/06/2021 1144   PROT 6.5 07/06/2021 1144   ALBUMIN 3.6 07/06/2021 1144   AST 12 (L) 07/06/2021 1144   AST 15 04/27/2021 1236   ALT 13 07/06/2021 1144   ALT 11 04/27/2021 1236   ALKPHOS 95 07/06/2021 1144   BILITOT 0.2 (L) 07/06/2021 1144   BILITOT 0.3 04/27/2021 1236   GFRNONAA >60 07/06/2021 1144   GFRNONAA >60 04/27/2021 1236    No results found for: TOTALPROTELP, ALBUMINELP, A1GS, A2GS, BETS, BETA2SER, GAMS, MSPIKE, SPEI  Lab Results  Component Value Date   WBC 6.1  07/19/2021   NEUTROABS 3.8 07/19/2021   HGB 12.1 07/19/2021   HCT 36.7 07/19/2021   MCV 91.3 07/19/2021   PLT 269 07/19/2021    No results found for: LABCA2  No components found for: LMBEML544  No results for  input(s): INR in the last 168 hours.  No results found for: LABCA2  No results found for: VWU981  No results found for: XBJ478  No results found for: GNF621  No results found for: CA2729  No components found for: HGQUANT  No results found for: CEA1 / No results found for: CEA1   No results found for: AFPTUMOR  No results found for: CHROMOGRNA  No results found for: KPAFRELGTCHN, LAMBDASER, KAPLAMBRATIO (kappa/lambda light chains)  No results found for: HGBA, HGBA2QUANT, HGBFQUANT, HGBSQUAN (Hemoglobinopathy evaluation)   No results found for: LDH  No results found for: IRON, TIBC, IRONPCTSAT (Iron and TIBC)  No results found for: FERRITIN  Urinalysis No results found for: COLORURINE, APPEARANCEUR, LABSPEC, PHURINE, GLUCOSEU, HGBUR, BILIRUBINUR, KETONESUR, PROTEINUR, UROBILINOGEN, NITRITE, LEUKOCYTESUR   STUDIES: No results found.   ELIGIBLE FOR AVAILABLE RESEARCH PROTOCOL: no  ASSESSMENT: 75 y.o. [place], [place] woman status post left breast upper outer quadrant biopsy 12/08/2020 for a clinical T2N0, stage IIA invasive ductal carcinoma, grade 3, estrogen and progesterone receptor positive, HER-2 not amplified, with an MIB-1 of 30%  (1) Oncotype obtained from the original biopsy shows a score of 32, predicting a risk of recurrence outside the breast in the next 9 years of 20% if node-negative, 25% if node positive, if antiestrogens are the only systemic treatment, also predicting a greater than 15% benefit from chemotherapy  (2) genetics testing 01/01/2021 through the [facility] CustomNext-Cancer +RNAinsight Panel found no deleterious mutations in APC, ATM, AXIN2, BARD1, BMPR1A, BRCA1, BRCA2, BRIP1, CDH1, CDK4, CDKN2A, CHEK2, DICER1, EPCAM, GREM1, HOXB13, MEN1,  MLH1, MSH2, MSH3, MSH6, MUTYH, NBN, NF1, NF2, NTHL1, PALB2, PMS2, POLD1, POLE, PTEN, RAD51C, RAD51D, RECQL, RET, SDHA, SDHAF2, SDHB, SDHC, SDHD, SMAD4, SMARCA4, STK11, TP53, TSC1, TSC2, and VHL.  RNA data is routinely analyzed for use in variant interpretation for all genes.  (3) anastrozole started neoadjuvantly 12/16/2020, discontinued 03/2021 with multiple side effect  (4) status post left mastectomy and sentinel lymph node sampling 04/07/2021 for a pT2 pN0, stage IIA invasive ductal carcinoma, grade 2, with negative margins  (A) a single left axilla lymph node removed  (B) repeat prognostic panel finds the cancer estrogen receptor positive, progesterone receptor and HER2 negative.  (5) adjuvant chemotherapy (CMF) starting 05/17/2021, to be repeated times  (6) adjuvant tamoxifen to follow   PLAN: Hanin continues to tolerate CMF remarkably well.  She has not lost her hair.  She will receive the fourth of 8 planned cycles today.  Dr. Donne Hazel feels the changes noted in her left incision is most likely a cyst.  We are setting her up with an ultrasound at [facility] in the next week or 2 to confirm that.  It is unchanged as compared to my last visit with her.  The rash that she is experiencing particularly in her left dorsal forearm is really actinic keratoses or precancers hat are being "treated "by her chemotherapy.  This will settle down after another cycle or 2.  It is fine to use emollients or hydrocortisone cream as she is using in the meantime  Total encounter time 20 minutes.Sarajane Jews C. Lurine Imel, MD 07/19/2021 9:46 AM Medical Oncology and Hematology [facility] [place], [place] [address] Tel. [phone]    Fax. [phone]   This document serves as a record of services personally performed by Lurline Del, MD. It was created on his behalf by Wilburn Mylar, a trained medical scribe. The creation of this record is based on the scribe's personal  observations and the provider's statements to them.   I, Lurline Del MD, have reviewed the above documentation for accuracy and completeness, and I agree with the above.   *Total Encounter Time as defined by the Centers for Medicare and Medicaid Services includes, in addition to the face-to-face time of a patient visit (documented in the note above) non-face-to-face time: obtaining and reviewing outside history, ordering and reviewing medications, tests or procedures, care coordination (communications with other health care professionals or caregivers) and documentation in the medical record.

## 2021-07-19 ENCOUNTER — Inpatient Hospital Stay: Payer: Medicare HMO

## 2021-07-19 ENCOUNTER — Encounter: Payer: Self-pay | Admitting: *Deleted

## 2021-07-19 ENCOUNTER — Other Ambulatory Visit: Payer: Self-pay

## 2021-07-19 ENCOUNTER — Inpatient Hospital Stay: Payer: Medicare HMO | Admitting: Oncology

## 2021-07-19 VITALS — BP 168/87 | HR 71 | Temp 97.2°F | Resp 14 | Ht 62.0 in | Wt 131.6 lb

## 2021-07-19 DIAGNOSIS — Z170 Estrogen receptor positive status [ER+]: Secondary | ICD-10-CM | POA: Diagnosis not present

## 2021-07-19 DIAGNOSIS — Z5111 Encounter for antineoplastic chemotherapy: Secondary | ICD-10-CM | POA: Diagnosis not present

## 2021-07-19 DIAGNOSIS — Z95828 Presence of other vascular implants and grafts: Secondary | ICD-10-CM

## 2021-07-19 DIAGNOSIS — C50412 Malignant neoplasm of upper-outer quadrant of left female breast: Secondary | ICD-10-CM | POA: Diagnosis not present

## 2021-07-19 LAB — CBC WITH DIFFERENTIAL/PLATELET
Abs Immature Granulocytes: 0.04 10*3/uL (ref 0.00–0.07)
Basophils Absolute: 0.1 10*3/uL (ref 0.0–0.1)
Basophils Relative: 1 %
Eosinophils Absolute: 0.3 10*3/uL (ref 0.0–0.5)
Eosinophils Relative: 5 %
HCT: 36.7 % (ref 36.0–46.0)
Hemoglobin: 12.1 g/dL (ref 12.0–15.0)
Immature Granulocytes: 1 %
Lymphocytes Relative: 19 %
Lymphs Abs: 1.2 10*3/uL (ref 0.7–4.0)
MCH: 30.1 pg (ref 26.0–34.0)
MCHC: 33 g/dL (ref 30.0–36.0)
MCV: 91.3 fL (ref 80.0–100.0)
Monocytes Absolute: 0.8 10*3/uL (ref 0.1–1.0)
Monocytes Relative: 12 %
Neutro Abs: 3.8 10*3/uL (ref 1.7–7.7)
Neutrophils Relative %: 62 %
Platelets: 269 10*3/uL (ref 150–400)
RBC: 4.02 MIL/uL (ref 3.87–5.11)
RDW: 15.7 % — ABNORMAL HIGH (ref 11.5–15.5)
WBC: 6.1 10*3/uL (ref 4.0–10.5)
nRBC: 0 % (ref 0.0–0.2)

## 2021-07-19 LAB — COMPREHENSIVE METABOLIC PANEL
ALT: 12 U/L (ref 0–44)
AST: 14 U/L — ABNORMAL LOW (ref 15–41)
Albumin: 3.6 g/dL (ref 3.5–5.0)
Alkaline Phosphatase: 90 U/L (ref 38–126)
Anion gap: 8 (ref 5–15)
BUN: 8 mg/dL (ref 8–23)
CO2: 27 mmol/L (ref 22–32)
Calcium: 9.1 mg/dL (ref 8.9–10.3)
Chloride: 108 mmol/L (ref 98–111)
Creatinine, Ser: 0.69 mg/dL (ref 0.44–1.00)
GFR, Estimated: >60 mL/min (ref >60)
Glucose, Bld: 115 mg/dL — ABNORMAL HIGH (ref 70–99)
Potassium: 3.9 mmol/L (ref 3.5–5.1)
Sodium: 143 mmol/L (ref 135–145)
Total Bilirubin: 0.4 mg/dL (ref 0.3–1.2)
Total Protein: 6.6 g/dL (ref 6.5–8.1)

## 2021-07-19 MED ORDER — SODIUM CHLORIDE 0.9 % IV SOLN
150.0000 mg | Freq: Once | INTRAVENOUS | Status: AC
  Administered 2021-07-19: 150 mg via INTRAVENOUS
  Filled 2021-07-19: qty 150

## 2021-07-19 MED ORDER — SODIUM CHLORIDE 0.9 % IV SOLN
600.0000 mg/m2 | Freq: Once | INTRAVENOUS | Status: AC
  Administered 2021-07-19: 980 mg via INTRAVENOUS
  Filled 2021-07-19: qty 49

## 2021-07-19 MED ORDER — SODIUM CHLORIDE 0.9% FLUSH
10.0000 mL | INTRAVENOUS | Status: DC | PRN
  Administered 2021-07-19: 10 mL

## 2021-07-19 MED ORDER — SODIUM CHLORIDE 0.9% FLUSH
10.0000 mL | Freq: Once | INTRAVENOUS | Status: AC
  Administered 2021-07-19: 10 mL

## 2021-07-19 MED ORDER — HEPARIN SOD (PORK) LOCK FLUSH 100 UNIT/ML IV SOLN
500.0000 [IU] | Freq: Once | INTRAVENOUS | Status: AC | PRN
  Administered 2021-07-19: 500 [IU]

## 2021-07-19 MED ORDER — SODIUM CHLORIDE 0.9 % IV SOLN: Freq: Once | INTRAVENOUS | Status: AC

## 2021-07-19 MED ORDER — SODIUM CHLORIDE 0.9 % IV SOLN
10.0000 mg | Freq: Once | INTRAVENOUS | Status: AC
  Administered 2021-07-19: 10 mg via INTRAVENOUS
  Filled 2021-07-19: qty 10

## 2021-07-19 MED ORDER — FLUOROURACIL CHEMO INJECTION 2.5 GM/50ML
600.0000 mg/m2 | Freq: Once | INTRAVENOUS | Status: AC
Start: 2021-07-19 — End: 2021-07-19
  Administered 2021-07-19: 950 mg via INTRAVENOUS
  Filled 2021-07-19: qty 19

## 2021-07-19 MED ORDER — METHOTREXATE SODIUM (PF) CHEMO INJECTION 250 MG/10ML
40.1000 mg/m2 | Freq: Once | INTRAMUSCULAR | Status: AC
  Administered 2021-07-19: 65 mg via INTRAVENOUS
  Filled 2021-07-19: qty 2.6

## 2021-07-19 NOTE — Patient Instructions (Signed)
[facility]  Discharge Instructions: Thank you for choosing [facility] to provide your oncology and hematology care.   If you have a lab appointment with the [facility], please go directly to the [facility] and check in at the registration area.   Wear comfortable clothing and clothing appropriate for easy access to any Portacath or PICC line.   We strive to give you quality time with your provider. You may need to reschedule your appointment if you arrive late (15 or more minutes).  Arriving late affects you and other patients whose appointments are after yours.  Also, if you miss three or more appointments without notifying the office, you may be dismissed from the clinic at the provider's discretion.      For prescription refill requests, have your pharmacy contact our office and allow 72 hours for refills to be completed.    Today you received the following chemotherapy and/or immunotherapy agents Cytoxan, methotrexate, and 5 FU      To help prevent nausea and vomiting after your treatment, we encourage you to take your nausea medication as directed.  BELOW ARE SYMPTOMS THAT SHOULD BE REPORTED IMMEDIATELY: *FEVER GREATER THAN 100.4 F (38 C) OR HIGHER *CHILLS OR SWEATING *NAUSEA AND VOMITING THAT IS NOT CONTROLLED WITH YOUR NAUSEA MEDICATION *UNUSUAL SHORTNESS OF BREATH *UNUSUAL BRUISING OR BLEEDING *URINARY PROBLEMS (pain or burning when urinating, or frequent urination) *BOWEL PROBLEMS (unusual diarrhea, constipation, pain near the anus) TENDERNESS IN MOUTH AND THROAT WITH OR WITHOUT PRESENCE OF ULCERS (sore throat, sores in mouth, or a toothache) UNUSUAL RASH, SWELLING OR PAIN  UNUSUAL VAGINAL DISCHARGE OR ITCHING   Items with * indicate a potential emergency and should be followed up as soon as possible or go to the Emergency Department if any problems should occur.  Please show the CHEMOTHERAPY ALERT CARD or IMMUNOTHERAPY ALERT  CARD at check-in to the Emergency Department and triage nurse.  Should you have questions after your visit or need to cancel or reschedule your appointment, please contact [facility]  Dept: [phone]  and follow the prompts.  Office hours are 8:00 a.m. to 4:30 p.m. Monday - Friday. Please note that voicemails left after 4:00 p.m. may not be returned until the following business day.  We are closed weekends and major holidays. You have access to a nurse at all times for urgent questions. Please call the main number to the clinic Dept: [phone] and follow the prompts.   For any non-urgent questions, you may also contact your provider using MyChart. We now offer e-Visits for anyone 46 and older to request care online for non-urgent symptoms. For details visit [email or web address].   Also download the MyChart app! Go to the app store, search "MyChart", open the app, select [facility], and log in with your MyChart username and password.  Due to Covid, a mask is required upon entering the hospital/clinic. If you do not have a mask, one will be given to you upon arrival. For doctor visits, patients may have 1 support person aged 87 or older with them. For treatment visits, patients cannot have anyone with them due to current Covid guidelines and our immunocompromised population.

## 2021-07-20 DIAGNOSIS — N6001 Solitary cyst of right breast: Secondary | ICD-10-CM | POA: Diagnosis not present

## 2021-07-21 DIAGNOSIS — M25512 Pain in left shoulder: Secondary | ICD-10-CM | POA: Diagnosis not present

## 2021-07-21 DIAGNOSIS — M25612 Stiffness of left shoulder, not elsewhere classified: Secondary | ICD-10-CM | POA: Diagnosis not present

## 2021-07-26 ENCOUNTER — Telehealth: Payer: Self-pay | Admitting: *Deleted

## 2021-07-26 ENCOUNTER — Other Ambulatory Visit: Payer: Self-pay | Admitting: *Deleted

## 2021-07-26 MED ORDER — MAGIC MOUTHWASH W/LIDOCAINE: 5.0000 mL | Freq: Four times a day (QID) | ORAL | 1 refills | Status: DC | PRN

## 2021-07-26 NOTE — Telephone Encounter (Signed)
Pt called to this RN to state ongoing " dry tight sore throat " since chemotherapy treatment last week of CMF.  She denies any fever, or production of mucus or phlegm.  She does not see any white coating in her mouth or throat.  She states " throat just feels very dry and tight- and difficult to swallow "  She is taking valcyclovir as per MAR.  She has not tested her self for Covid- does have a test in the home and will proceed to doing that now.  This note will be given to MD for review for further recommendations.

## 2021-07-27 DIAGNOSIS — M25612 Stiffness of left shoulder, not elsewhere classified: Secondary | ICD-10-CM | POA: Diagnosis not present

## 2021-07-27 DIAGNOSIS — M25512 Pain in left shoulder: Secondary | ICD-10-CM | POA: Diagnosis not present

## 2021-07-29 DIAGNOSIS — M25512 Pain in left shoulder: Secondary | ICD-10-CM | POA: Diagnosis not present

## 2021-07-29 DIAGNOSIS — M25612 Stiffness of left shoulder, not elsewhere classified: Secondary | ICD-10-CM | POA: Diagnosis not present

## 2021-08-02 DIAGNOSIS — M25612 Stiffness of left shoulder, not elsewhere classified: Secondary | ICD-10-CM | POA: Diagnosis not present

## 2021-08-02 DIAGNOSIS — M25512 Pain in left shoulder: Secondary | ICD-10-CM | POA: Diagnosis not present

## 2021-08-04 DIAGNOSIS — M25512 Pain in left shoulder: Secondary | ICD-10-CM | POA: Diagnosis not present

## 2021-08-04 DIAGNOSIS — M25612 Stiffness of left shoulder, not elsewhere classified: Secondary | ICD-10-CM | POA: Diagnosis not present

## 2021-08-06 MED FILL — Dexamethasone Sodium Phosphate Inj 100 MG/10ML: INTRAMUSCULAR | Qty: 1 | Status: AC

## 2021-08-06 MED FILL — Fosaprepitant Dimeglumine For IV Infusion 150 MG (Base Eq): INTRAVENOUS | Qty: 5 | Status: AC

## 2021-08-08 NOTE — Progress Notes (Signed)
[place]  [phone] [phone]     ID: Gina Hunt DOB: [birth year]  MR#: [id number]  EEF#:[id number]  Patient Care Team: [name], DO as PCP - General (Family Medicine) Rockwell Germany, RN as Oncology Nurse Navigator Mauro Kaufmann, RN as Oncology Nurse Navigator Rolm Bookbinder, MD as Consulting Physician (General Surgery) Josecarlos Harriott, [name], MD as Consulting Physician (Oncology) Kyung Rudd, MD as Consulting Physician (Radiation Oncology) Sheryn Bison, MD as Consulting Physician (Dermatology) Chauncey Cruel, MD OTHER MD:   CHIEF COMPLAINT: Estrogen receptor positive breast cancer (s/p left mastectomy)  CURRENT TREATMENT: Adjuvant chemotherapy   INTERVAL HISTORY: Shaida returns today for follow up and treatment of her estrogen receptor positive breast cancer.  her niece Janyla Biscoe began adjuvant CMF on 05/17/2021.  Today is day 1 cycle 5.  Since her last visit, she underwent left breast ultrasound to evaluate a palpable lump, felt to be a cyst by Dr. Donne Hazel. Performed on 07/20/2021 at [facility], the ultrasound showed: the 0.5 cm oval complicated cyst in left breast likely represents an evolving oil cyst/fat necrosis from recent mastectomy and is probably benign. A follow up ultrasound is recommended in 3 months.   REVIEW OF SYSTEMS: Malayah tells me she continues to be constipated despite the fact that she takes MiraLAX daily and has been using Dulcolax suppositories.  She is not exercising regularly except she takes walks sometimes.  Her mouth is dry and she has a little cough in the morning which makes her have a little sore throat and hoarseness.  They are planning on a cruise in February and she is looking forward to that.  A detailed review of systems today was otherwise stable   COVID 19 VACCINATION STATUS: [place] x2, booster September 2022  HISTORY OF CURRENT ILLNESS: From the original intake note:  [name] had routine screening mammography on 11/24/2020 showing a possible abnormality in the left breast. She underwent left breast ultrasonography at [facility] on 12/02/2020 showing: breast density category A; 2.5 cm mass in left breast at 2 o'clock (measuring 1.7 cm in ultrasound); no abnormal left axillary lymph nodes.  Accordingly on 12/08/2020 she proceeded to biopsy of the left breast area in question. The pathology from this procedure ([id number]) showed: invasive ductal carcinoma, grade 3. Prognostic indicators significant for: estrogen receptor, 95% positive with strong staining intensity and progesterone receptor, 80% positive with moderate staining intensity. Proliferation marker Ki67 at 30%. HER2 equivocal by immunohistochemistry (2+), but negative by fluorescent in situ hybridization with a signals ratio 1.64 and number per cell 2.95.  Cancer Staging Malignant neoplasm of upper-outer quadrant of left breast in female, estrogen receptor positive ([place]) Staging form: Breast, AJCC 8th Edition - Clinical stage from 12/16/2020: Stage IIA (cT2, cN0, cM0, G3, ER+, PR+, HER2-) - Signed by Chauncey Cruel, MD on 12/16/2020 Stage prefix: Initial diagnosis  The patient's subsequent history is as detailed below.   PAST MEDICAL HISTORY: Past Medical History:  Diagnosis Date   Anxiety    Breast cancer ([place]) 01/2021   left breast IDC   Complication of anesthesia    Family history of breast cancer 12/16/2020   Family history of gastric cancer 12/16/2020   Fibromyalgia    GERD (gastroesophageal reflux disease)    Hyperlipidemia    Hypothyroidism    Osteoarthritis    knees, hands, fingers   Osteoarthritis    PONV (postoperative nausea and vomiting)    Skin cancer  PAST SURGICAL HISTORY: Past Surgical History:  Procedure Laterality Date   ABDOMINAL HYSTERECTOMY     COLONOSCOPY  11/24/2015   EVACUATION BREAST HEMATOMA Left 04/07/2021   Procedure: EVACUATION HEMATOMA  BREAST;  Surgeon: Rolm Bookbinder, MD;  Location: [name];  Service: General;  Laterality: Left;   LIVER SURGERY     PORTA CATH INSERTION Right 04/07/2021   Procedure: PORTA CATH INSERTION;  Surgeon: Rolm Bookbinder, MD;  Location: [facility];  Service: General;  Laterality: Right;   SIMPLE MASTECTOMY WITH AXILLARY SENTINEL NODE BIOPSY Left 04/07/2021   Procedure: LEFT SIMPLE MASTECTOMY WITH LEFT AXILLARY SENTINEL NODE BIOPSY;  Surgeon: Rolm Bookbinder, MD;  Location: [facility];  Service: General;  Laterality: Left;   TONSILLECTOMY      FAMILY HISTORY: Family History  Problem Relation Age of Onset   Hypertension Mother    Diabetes Mother    Arthritis Mother    Heart disease Mother    Rheum arthritis Father    Diabetes Father    Heart disease Father    Stomach cancer Brother 75   Stomach cancer Brother 80   Esophageal cancer Brother 75   Breast cancer Maternal Aunt 70   Breast cancer Maternal Aunt 72   Breast cancer Maternal Aunt 73   Colon cancer Neg Hx    Colon polyps Neg Hx   Her father and mother both died from MI, father age 25 and mother age 21. Leonie has 6 brothers and 1 sister. She reports breast cancer in 3 maternal aunts, one in their 5's. One of her maternal aunt's daughter (Ailen's cousin) had breast cancer at age 2.The patient also reports stomach cancer in two of her brothers in their 63's, one of whom also had esophageal cancer.    GYNECOLOGIC HISTORY:  No LMP recorded. Patient has had a hysterectomy. Menarche: 75 years old Age at first live birth: 75 years old Edgar P 1 LMP age 76 Contraceptive: never used HRT never used  Hysterectomy? yes BSO? yes   SOCIAL HISTORY: (updated 12/2020)  Briannia is currently retired from working at the [employer] in [place] died late February 3710 from complications of Agent Orange exposure. She lives by herself. Daughter [name] lives in  [place], the patient believes, but they have been alienated for many years.    ADVANCED DIRECTIVES: not in place; intends to name her niece, [name], as her HCPOA.  The patient was given the appropriate documents to complete and notarized at her discretion on her 12/16/2020 visit   HEALTH MAINTENANCE: Social History   Tobacco Use   Smoking status: Never   Smokeless tobacco: Never  Vaping Use   Vaping Use: Never used  Substance Use Topics   Alcohol use: Yes    Comment: rarely    Drug use: Never     Colonoscopy: 05/2019 (Dr. Bryan Lemma), recall 2027  PAP: "10 years ago" (~2012)  Bone density: 2020   Allergies  Allergen Reactions   Codeine Other (See Comments)    Hallucination    Morphine And Related Other (See Comments)    Knocks me out per pt    Current Outpatient Medications  Medication Sig Dispense Refill   ciprofloxacin (CIPRO) 500 MG tablet Take 1 tablet (500 mg total) by mouth 2 (two) times daily. 6 tablet 0   dexamethasone (DECADRON) 4 MG tablet Take 2 tablets (8 mg total) by mouth 2 (two) times daily. Start the day after chemotherapy  for 2 days. Take with food. 30 tablet 1   DULoxetine (CYMBALTA) 60 MG capsule Take 1 capsule (60 mg total) by mouth 2 (two) times daily. 180 capsule 2   gabapentin (NEURONTIN) 300 MG capsule Take 1 capsule (300 mg total) by mouth 3 (three) times daily. 270 capsule 1   levothyroxine (SYNTHROID) 50 MCG tablet Take 1 tablet (50 mcg total) by mouth daily before breakfast. 90 tablet 2   lidocaine-prilocaine (EMLA) cream Apply to affected area once 30 g 3   magic mouthwash w/lidocaine SOLN Take 5 mLs by mouth 4 (four) times daily as needed for mouth pain. 240 mL 1   methocarbamol (ROBAXIN) 500 MG tablet Take 1 tablet (500 mg total) by mouth every 6 (six) hours as needed for muscle spasms. 20 tablet 0   omeprazole (PRILOSEC) 40 MG capsule Take 1 capsule (40 mg total) by mouth daily. 90 capsule 2   prochlorperazine (COMPAZINE) 10 MG tablet  Take 1 tablet (10 mg total) by mouth every 6 (six) hours as needed (Nausea or vomiting). 30 tablet 1   simvastatin (ZOCOR) 40 MG tablet Take 1 tablet (40 mg total) by mouth daily. 90 tablet 2   valACYclovir (VALTREX) 1000 MG tablet Take 1 tablet (1,000 mg total) by mouth 2 (two) times daily. 90 tablet 2   No current facility-administered medications for this visit.    OBJECTIVE: White woman in no acute distress  Vitals:   08/09/21 1010  BP: (!) 174/70  Pulse: 65  Resp: 18  Temp: (!) 97.5 F (36.4 C)  SpO2: 100%      Body mass index is 24 kg/m.   Wt Readings from Last 3 Encounters:  08/09/21 131 lb 3.2 oz (59.5 kg)  07/19/21 131 lb 9.6 oz (59.7 kg)  07/12/21 128 lb (58.1 kg)     ECOG FS:1 - Symptomatic but completely ambulatory  Sclerae unicteric, EOMs intact Wearing a mask No cervical or supraclavicular adenopathy Lungs no rales or rhonchi Heart regular rate and rhythm Abd soft, nontender, not distended, positive bowel sounds MSK no focal spinal tenderness, no upper extremity lymphedema Neuro: nonfocal, well oriented, appropriate affect Breasts: The right breast is benign per the left breast is status post mastectomy.  There is no evidence of local recurrence.  Both axillae are benign.   LAB RESULTS:  CMP     Component Value Date/Time   NA 143 07/19/2021 0931   K 3.9 07/19/2021 0931   CL 108 07/19/2021 0931   CO2 27 07/19/2021 0931   GLUCOSE 115 (H) 07/19/2021 0931   BUN 8 07/19/2021 0931   CREATININE 0.69 07/19/2021 0931   CREATININE 0.72 04/27/2021 1236   CREATININE 0.58 (L) 12/20/2019 1422   CALCIUM 9.1 07/19/2021 0931   PROT 6.6 07/19/2021 0931   ALBUMIN 3.6 07/19/2021 0931   AST 14 (L) 07/19/2021 0931   AST 15 04/27/2021 1236   ALT 12 07/19/2021 0931   ALT 11 04/27/2021 1236   ALKPHOS 90 07/19/2021 0931   BILITOT 0.4 07/19/2021 0931   BILITOT 0.3 04/27/2021 1236   GFRNONAA >60 07/19/2021 0931   GFRNONAA >60 04/27/2021 1236    No results found  for: TOTALPROTELP, ALBUMINELP, A1GS, A2GS, BETS, BETA2SER, GAMS, MSPIKE, SPEI  Lab Results  Component Value Date   WBC 5.6 08/09/2021   NEUTROABS 3.3 08/09/2021   HGB 11.9 (L) 08/09/2021   HCT 36.7 08/09/2021   MCV 93.9 08/09/2021   PLT 300 08/09/2021    No results found for: LABCA2  No components found for: [id number]  No results for input(s): INR in the last 168 hours.  No results found for: LABCA2  No results found for: EYC144  No results found for: YJE563  No results found for: JSH702  No results found for: CA2729  No components found for: HGQUANT  No results found for: CEA1 / No results found for: CEA1   No results found for: AFPTUMOR  No results found for: CHROMOGRNA  No results found for: KPAFRELGTCHN, LAMBDASER, KAPLAMBRATIO (kappa/lambda light chains)  No results found for: HGBA, HGBA2QUANT, HGBFQUANT, HGBSQUAN (Hemoglobinopathy evaluation)   No results found for: LDH  No results found for: IRON, TIBC, IRONPCTSAT (Iron and TIBC)  No results found for: FERRITIN  Urinalysis No results found for: COLORURINE, APPEARANCEUR, LABSPEC, PHURINE, GLUCOSEU, HGBUR, BILIRUBINUR, KETONESUR, PROTEINUR, UROBILINOGEN, NITRITE, LEUKOCYTESUR   STUDIES: No results found.   ELIGIBLE FOR AVAILABLE RESEARCH PROTOCOL: no  ASSESSMENT: 75 y.o. [place], [place] woman status post left breast upper outer quadrant biopsy 12/08/2020 for a clinical T2N0, stage IIA invasive ductal carcinoma, grade 3, estrogen and progesterone receptor positive, HER-2 not amplified, with an MIB-1 of 30%  (1) Oncotype obtained from the original biopsy shows a score of 32, predicting a risk of recurrence outside the breast in the next 9 years of 20% if node-negative, 25% if node positive, if antiestrogens are the only systemic treatment, also predicting a greater than 15% benefit from chemotherapy  (2) genetics testing 01/01/2021 through the [facility] CustomNext-Cancer +RNAinsight Panel found no deleterious  mutations in APC, ATM, AXIN2, BARD1, BMPR1A, BRCA1, BRCA2, BRIP1, CDH1, CDK4, CDKN2A, CHEK2, DICER1, EPCAM, GREM1, HOXB13, MEN1, MLH1, MSH2, MSH3, MSH6, MUTYH, NBN, NF1, NF2, NTHL1, PALB2, PMS2, POLD1, POLE, PTEN, RAD51C, RAD51D, RECQL, RET, SDHA, SDHAF2, SDHB, SDHC, SDHD, SMAD4, SMARCA4, STK11, TP53, TSC1, TSC2, and VHL.  RNA data is routinely analyzed for use in variant interpretation for all genes.  (3) anastrozole started neoadjuvantly 12/16/2020, discontinued 03/2021 with multiple side effects  (4) status post left mastectomy and sentinel lymph node sampling 04/07/2021 for a pT2 pN0, stage IIA invasive ductal carcinoma, grade 2, with negative margins  (A) a single left axilla lymph node removed  (B) repeat prognostic panel finds the cancer estrogen receptor positive, progesterone receptor and HER2 negative  (C) palpable lesion in the left breast consistent with evolving 0.5 oil cyst/fat necrosis by ultrasonography at [facility] on 07/20/2021  (5) adjuvant chemotherapy with cyclophosphamide, methotrexate and fluorouracil (CMF) starting 05/17/2021, to be repeated every 21 days times 8  (6) adjuvant tamoxifen to follow   PLAN: Wendie is tolerating CMF chemotherapy well and she will proceed to cycle #5 today.  I am not sure why she is so constipated.  This is not clinically apparent.  I suggested she could try a half a bottle of magnesium citrate and see if that works.  Functionally she has normal activity and I really cannot detect any significant side effects related to her chemotherapy or tumor.  She will see me again in 3 weeks with cycle #6  Total encounter time 20 minutes.Sarajane Jews C. Aleecia Tapia, MD 08/09/2021 10:31 AM Medical Oncology and Hematology [facility] [place], [place] [address] Tel. [phone]    Fax. [phone]   This document serves as a record of services personally performed by Lurline Del, MD. It was created on his behalf by Wilburn Mylar, a trained medical scribe. The creation of this record is based on the scribe's personal observations and the provider's  statements to them.   I, Lurline Del MD, have reviewed the above documentation for accuracy and completeness, and I agree with the above.   *Total Encounter Time as defined by the Centers for Medicare and Medicaid Services includes, in addition to the face-to-face time of a patient visit (documented in the note above) non-face-to-face time: obtaining and reviewing outside history, ordering and reviewing medications, tests or procedures, care coordination (communications with other health care professionals or caregivers) and documentation in the medical record.

## 2021-08-09 ENCOUNTER — Other Ambulatory Visit: Payer: Self-pay

## 2021-08-09 ENCOUNTER — Inpatient Hospital Stay: Payer: Medicare HMO | Admitting: Oncology

## 2021-08-09 ENCOUNTER — Inpatient Hospital Stay: Payer: Medicare HMO | Attending: Oncology

## 2021-08-09 ENCOUNTER — Inpatient Hospital Stay: Payer: Medicare HMO

## 2021-08-09 VITALS — BP 174/70 | HR 65 | Temp 97.5°F | Resp 18 | Ht 62.0 in | Wt 131.2 lb

## 2021-08-09 DIAGNOSIS — Z170 Estrogen receptor positive status [ER+]: Secondary | ICD-10-CM | POA: Insufficient documentation

## 2021-08-09 DIAGNOSIS — C50412 Malignant neoplasm of upper-outer quadrant of left female breast: Secondary | ICD-10-CM

## 2021-08-09 DIAGNOSIS — Z5111 Encounter for antineoplastic chemotherapy: Secondary | ICD-10-CM | POA: Diagnosis not present

## 2021-08-09 DIAGNOSIS — Z95828 Presence of other vascular implants and grafts: Secondary | ICD-10-CM

## 2021-08-09 LAB — CBC WITH DIFFERENTIAL/PLATELET
Abs Immature Granulocytes: 0.03 10*3/uL (ref 0.00–0.07)
Basophils Absolute: 0.1 10*3/uL (ref 0.0–0.1)
Basophils Relative: 1 %
Eosinophils Absolute: 0.4 10*3/uL (ref 0.0–0.5)
Eosinophils Relative: 7 %
HCT: 36.7 % (ref 36.0–46.0)
Hemoglobin: 11.9 g/dL — ABNORMAL LOW (ref 12.0–15.0)
Immature Granulocytes: 1 %
Lymphocytes Relative: 20 %
Lymphs Abs: 1.1 10*3/uL (ref 0.7–4.0)
MCH: 30.4 pg (ref 26.0–34.0)
MCHC: 32.4 g/dL (ref 30.0–36.0)
MCV: 93.9 fL (ref 80.0–100.0)
Monocytes Absolute: 0.7 10*3/uL (ref 0.1–1.0)
Monocytes Relative: 13 %
Neutro Abs: 3.3 10*3/uL (ref 1.7–7.7)
Neutrophils Relative %: 58 %
Platelets: 300 10*3/uL (ref 150–400)
RBC: 3.91 MIL/uL (ref 3.87–5.11)
RDW: 17.5 % — ABNORMAL HIGH (ref 11.5–15.5)
WBC: 5.6 10*3/uL (ref 4.0–10.5)
nRBC: 0 % (ref 0.0–0.2)

## 2021-08-09 LAB — COMPREHENSIVE METABOLIC PANEL
ALT: 15 U/L (ref 0–44)
AST: 18 U/L (ref 15–41)
Albumin: 3.6 g/dL (ref 3.5–5.0)
Alkaline Phosphatase: 108 U/L (ref 38–126)
Anion gap: 8 (ref 5–15)
BUN: 7 mg/dL — ABNORMAL LOW (ref 8–23)
CO2: 27 mmol/L (ref 22–32)
Calcium: 9.4 mg/dL (ref 8.9–10.3)
Chloride: 109 mmol/L (ref 98–111)
Creatinine, Ser: 0.72 mg/dL (ref 0.44–1.00)
GFR, Estimated: >60 mL/min (ref >60)
Glucose, Bld: 123 mg/dL — ABNORMAL HIGH (ref 70–99)
Potassium: 3.9 mmol/L (ref 3.5–5.1)
Sodium: 144 mmol/L (ref 135–145)
Total Bilirubin: 0.4 mg/dL (ref 0.3–1.2)
Total Protein: 6.8 g/dL (ref 6.5–8.1)

## 2021-08-09 MED ORDER — SODIUM CHLORIDE 0.9 % IV SOLN
10.0000 mg | Freq: Once | INTRAVENOUS | Status: AC
  Administered 2021-08-09: 10 mg via INTRAVENOUS
  Filled 2021-08-09: qty 10

## 2021-08-09 MED ORDER — SODIUM CHLORIDE 0.9 % IV SOLN
150.0000 mg | Freq: Once | INTRAVENOUS | Status: AC
  Administered 2021-08-09: 150 mg via INTRAVENOUS
  Filled 2021-08-09: qty 150

## 2021-08-09 MED ORDER — SODIUM CHLORIDE 0.9 % IV SOLN
600.0000 mg/m2 | Freq: Once | INTRAVENOUS | Status: AC
  Administered 2021-08-09: 980 mg via INTRAVENOUS
  Filled 2021-08-09: qty 49

## 2021-08-09 MED ORDER — SODIUM CHLORIDE 0.9 % IV SOLN: Freq: Once | INTRAVENOUS | Status: AC

## 2021-08-09 MED ORDER — METHOTREXATE SODIUM (PF) CHEMO INJECTION 250 MG/10ML
40.1000 mg/m2 | Freq: Once | INTRAMUSCULAR | Status: AC
Start: 2021-08-09 — End: 2021-08-09
  Administered 2021-08-09: 65 mg via INTRAVENOUS
  Filled 2021-08-09: qty 2.6

## 2021-08-09 MED ORDER — FLUOROURACIL CHEMO INJECTION 2.5 GM/50ML
600.0000 mg/m2 | Freq: Once | INTRAVENOUS | Status: AC
  Administered 2021-08-09: 950 mg via INTRAVENOUS
  Filled 2021-08-09: qty 19

## 2021-08-09 MED ORDER — HEPARIN SOD (PORK) LOCK FLUSH 100 UNIT/ML IV SOLN
500.0000 [IU] | Freq: Once | INTRAVENOUS | Status: AC | PRN
Start: 2021-08-09 — End: 2021-08-09
  Administered 2021-08-09: 500 [IU]

## 2021-08-09 MED ORDER — SODIUM CHLORIDE 0.9% FLUSH
10.0000 mL | Freq: Once | INTRAVENOUS | Status: AC
  Administered 2021-08-09: 10 mL

## 2021-08-09 MED ORDER — SODIUM CHLORIDE 0.9% FLUSH
10.0000 mL | INTRAVENOUS | Status: DC | PRN
Start: 2021-08-09 — End: 2021-08-09
  Administered 2021-08-09: 10 mL

## 2021-08-09 NOTE — Patient Instructions (Signed)
[facility]  Discharge Instructions: Thank you for choosing [facility] to provide your oncology and hematology care.   If you have a lab appointment with the [facility], please go directly to the [facility] and check in at the registration area.   Wear comfortable clothing and clothing appropriate for easy access to any Portacath or PICC line.   We strive to give you quality time with your provider. You may need to reschedule your appointment if you arrive late (15 or more minutes).  Arriving late affects you and other patients whose appointments are after yours.  Also, if you miss three or more appointments without notifying the office, you may be dismissed from the clinic at the provider's discretion.      For prescription refill requests, have your pharmacy contact our office and allow 72 hours for refills to be completed.    Today you received the following chemotherapy and/or immunotherapy agents Cytoxan, methotrexate, and 5 FU      To help prevent nausea and vomiting after your treatment, we encourage you to take your nausea medication as directed.  BELOW ARE SYMPTOMS THAT SHOULD BE REPORTED IMMEDIATELY: *FEVER GREATER THAN 100.4 F (38 C) OR HIGHER *CHILLS OR SWEATING *NAUSEA AND VOMITING THAT IS NOT CONTROLLED WITH YOUR NAUSEA MEDICATION *UNUSUAL SHORTNESS OF BREATH *UNUSUAL BRUISING OR BLEEDING *URINARY PROBLEMS (pain or burning when urinating, or frequent urination) *BOWEL PROBLEMS (unusual diarrhea, constipation, pain near the anus) TENDERNESS IN MOUTH AND THROAT WITH OR WITHOUT PRESENCE OF ULCERS (sore throat, sores in mouth, or a toothache) UNUSUAL RASH, SWELLING OR PAIN  UNUSUAL VAGINAL DISCHARGE OR ITCHING   Items with * indicate a potential emergency and should be followed up as soon as possible or go to the Emergency Department if any problems should occur.  Please show the CHEMOTHERAPY ALERT CARD or IMMUNOTHERAPY ALERT  CARD at check-in to the Emergency Department and triage nurse.  Should you have questions after your visit or need to cancel or reschedule your appointment, please contact [facility]  Dept: [phone]  and follow the prompts.  Office hours are 8:00 a.m. to 4:30 p.m. Monday - Friday. Please note that voicemails left after 4:00 p.m. may not be returned until the following business day.  We are closed weekends and major holidays. You have access to a nurse at all times for urgent questions. Please call the main number to the clinic Dept: [phone] and follow the prompts.   For any non-urgent questions, you may also contact your provider using MyChart. We now offer e-Visits for anyone 46 and older to request care online for non-urgent symptoms. For details visit [email or web address].   Also download the MyChart app! Go to the app store, search "MyChart", open the app, select [facility], and log in with your MyChart username and password.  Due to Covid, a mask is required upon entering the hospital/clinic. If you do not have a mask, one will be given to you upon arrival. For doctor visits, patients may have 1 support person aged 87 or older with them. For treatment visits, patients cannot have anyone with them due to current Covid guidelines and our immunocompromised population.

## 2021-08-23 ENCOUNTER — Telehealth: Payer: Self-pay | Admitting: *Deleted

## 2021-08-23 NOTE — Telephone Encounter (Signed)
This RN spoke with pt per her call stating ongoing upper respiratory congestion now with deep cough.  She denies any fevers or chills.  She has not tried OTC meds due to unsure what she is able to take.  Pt is C5 d14 of CMF.  Per MD review- proceed with use of OTC meds for symptoms- if worsens- or develops fever pt is to call .  This RN discussed above with pt who verbalized understanding.

## 2021-08-24 ENCOUNTER — Encounter: Payer: Self-pay | Admitting: Oncology

## 2021-08-24 DIAGNOSIS — M25512 Pain in left shoulder: Secondary | ICD-10-CM | POA: Diagnosis not present

## 2021-08-24 DIAGNOSIS — M25612 Stiffness of left shoulder, not elsewhere classified: Secondary | ICD-10-CM | POA: Diagnosis not present

## 2021-08-27 MED FILL — Fosaprepitant Dimeglumine For IV Infusion 150 MG (Base Eq): INTRAVENOUS | Qty: 5 | Status: AC

## 2021-08-27 MED FILL — Dexamethasone Sodium Phosphate Inj 100 MG/10ML: INTRAMUSCULAR | Qty: 1 | Status: AC

## 2021-08-29 NOTE — Progress Notes (Signed)
[place]  Telephone:([phone]) [phone] Fax:(336) [phone]     ID: JOYANNA Hunt DOB: November 28, 1945  MR#: [id number]  PVX#:[id number]  Patient Care Team: [name], DO as PCP - General (Family Medicine) Rockwell Germany, RN as Oncology Nurse Navigator Mauro Kaufmann, RN as Oncology Nurse Navigator Rolm Bookbinder, MD as Consulting Physician (General Surgery) Kohler Pellerito, [name], MD as Consulting Physician (Oncology) Kyung Rudd, MD as Consulting Physician (Radiation Oncology) Sheryn Bison, MD as Consulting Physician (Dermatology) Chauncey Cruel, MD OTHER MD:   CHIEF COMPLAINT: Estrogen receptor positive breast cancer (s/p left mastectomy)  CURRENT TREATMENT: Adjuvant chemotherapy   INTERVAL HISTORY: [name] returns today for follow up and treatment of her estrogen receptor positive breast cancer.  She is accompanied by a close friend  Since last visit here she underwent a limited left breast ultrasound at [facility] on 07/20/2021, to evaluate a small firm mobile 3 mm lump in the lateral aspect of the superior mastectomy flap.  Sonography over this area showed a 0.5 cm oval complex cyst.  A repeat ultrasound in 3 months was recommended.  Novalynn began adjuvant CMF on 05/17/2021.  Today is day 1 cycle 6.   REVIEW OF SYSTEMS: [name] is tolerating CMF generally well.  Her hair has thinned a little but not noticeably.  She has "not felt good" the last few weeks.  It was like a cold she says but not exactly.  She had no fever.  She did have some greenish phlegm.  She denies shortness of breath.  She continues to walk daily.  Her energy however is down and her sleep continues to be disturbed.  Aside from that a detailed review of systems today was stable   COVID 19 VACCINATION STATUS: [place] x2, booster September 2022  HISTORY OF CURRENT ILLNESS: From the original intake note:  Gina Hunt (pronounced "Erlene Quan") had routine screening mammography on 11/24/2020  showing a possible abnormality in the left breast. She underwent left breast ultrasonography at [facility] on 12/02/2020 showing: breast density category A; 2.5 cm mass in left breast at 2 o'clock (measuring 1.7 cm in ultrasound); no abnormal left axillary lymph nodes.  Accordingly on 12/08/2020 she proceeded to biopsy of the left breast area in question. The pathology from this procedure ([id number]) showed: invasive ductal carcinoma, grade 3. Prognostic indicators significant for: estrogen receptor, 95% positive with strong staining intensity and progesterone receptor, 80% positive with moderate staining intensity. Proliferation marker Ki67 at 30%. HER2 equivocal by immunohistochemistry (2+), but negative by fluorescent in situ hybridization with a signals ratio 1.64 and number per cell 2.95.  Cancer Staging Malignant neoplasm of upper-outer quadrant of left breast in female, estrogen receptor positive ([place]) Staging form: Breast, AJCC 8th Edition - Clinical stage from 12/16/2020: Stage IIA (cT2, cN0, cM0, G3, ER+, PR+, HER2-) - Signed by Chauncey Cruel, MD on 12/16/2020 Stage prefix: Initial diagnosis  The patient's subsequent history is as detailed below.   PAST MEDICAL HISTORY: Past Medical History:  Diagnosis Date   Anxiety    Breast cancer ([place]) 01/2021   left breast IDC   Complication of anesthesia    Family history of breast cancer 12/16/2020   Family history of gastric cancer 12/16/2020   Fibromyalgia    GERD (gastroesophageal reflux disease)    Hyperlipidemia    Hypothyroidism    Osteoarthritis    knees, hands, fingers   Osteoarthritis    PONV (postoperative nausea and vomiting)    Skin cancer  PAST SURGICAL HISTORY: Past Surgical History:  Procedure Laterality Date   ABDOMINAL HYSTERECTOMY     COLONOSCOPY  11/24/2015   EVACUATION BREAST HEMATOMA Left 04/07/2021   Procedure: EVACUATION HEMATOMA BREAST;  Surgeon: Rolm Bookbinder, MD;  Location: [place];   Service: General;  Laterality: Left;   LIVER SURGERY     PORTA CATH INSERTION Right 04/07/2021   Procedure: PORTA CATH INSERTION;  Surgeon: Rolm Bookbinder, MD;  Location: [place];  Service: General;  Laterality: Right;   SIMPLE MASTECTOMY WITH AXILLARY SENTINEL NODE BIOPSY Left 04/07/2021   Procedure: LEFT SIMPLE MASTECTOMY WITH LEFT AXILLARY SENTINEL NODE BIOPSY;  Surgeon: Rolm Bookbinder, MD;  Location: [place];  Service: General;  Laterality: Left;   TONSILLECTOMY      FAMILY HISTORY: Family History  Problem Relation Age of Onset   Hypertension Mother    Diabetes Mother    Arthritis Mother    Heart disease Mother    Rheum arthritis Father    Diabetes Father    Heart disease Father    Stomach cancer Brother 70   Stomach cancer Brother 20   Esophageal cancer Brother 59   Breast cancer Maternal Aunt 70   Breast cancer Maternal Aunt 72   Breast cancer Maternal Aunt 73   Colon cancer Neg Hx    Colon polyps Neg Hx   Her father and mother both died from MI, father age 87 and mother age 61. Gina Hunt has 6 brothers and 1 sister. She reports breast cancer in 3 maternal aunts, one in their 61's. One of her maternal aunt's daughter (Joelys's cousin) had breast cancer at age 64.The patient also reports stomach cancer in two of her brothers in their 62's, one of whom also had esophageal cancer.    GYNECOLOGIC HISTORY:  No LMP recorded. Patient has had a hysterectomy. Menarche: 75 years old Age at first live birth: 75 years old [place] P 1 LMP age 75 Contraceptive: never used HRT never used  Hysterectomy? yes BSO? yes   SOCIAL HISTORY: (updated 12/2020)  Shellsea is currently retired from working at the [employer] in [place] died late February 0630 from complications of Agent Orange exposure. She lives by herself. Daughter Gina Hunt lives in [place], the patient believes, but they have been alienated for many  years.    ADVANCED DIRECTIVES: not in place; intends to name her niece, Gina Hunt, as her HCPOA.  The patient was given the appropriate documents to complete and notarized at her discretion on her 12/16/2020 visit   HEALTH MAINTENANCE: Social History   Tobacco Use   Smoking status: Never   Smokeless tobacco: Never  Vaping Use   Vaping Use: Never used  Substance Use Topics   Alcohol use: Yes    Comment: rarely    Drug use: Never     Colonoscopy: 05/2019 (Dr. Bryan Lemma), recall 2027  PAP: "10 years ago" (~2012)  Bone density: 2020   Allergies  Allergen Reactions   Codeine Other (See Comments)    Hallucination    Morphine And Related Other (See Comments)    Knocks me out per pt    Current Outpatient Medications  Medication Sig Dispense Refill   dexamethasone (DECADRON) 4 MG tablet Take 2 tablets (8 mg total) by mouth 2 (two) times daily. Start the day after chemotherapy for 2 days. Take with food. 30 tablet 1   DULoxetine (CYMBALTA) 60 MG capsule Take 1 capsule (60 mg total)  by mouth 2 (two) times daily. 180 capsule 2   gabapentin (NEURONTIN) 300 MG capsule Take 1 capsule (300 mg total) by mouth 3 (three) times daily. 270 capsule 1   levothyroxine (SYNTHROID) 50 MCG tablet Take 1 tablet (50 mcg total) by mouth daily before breakfast. 90 tablet 2   lidocaine-prilocaine (EMLA) cream Apply to affected area once 30 g 3   magic mouthwash w/lidocaine SOLN Take 5 mLs by mouth 4 (four) times daily as needed for mouth pain. 240 mL 1   methocarbamol (ROBAXIN) 500 MG tablet Take 1 tablet (500 mg total) by mouth every 6 (six) hours as needed for muscle spasms. 20 tablet 0   omeprazole (PRILOSEC) 40 MG capsule Take 1 capsule (40 mg total) by mouth daily. 90 capsule 2   prochlorperazine (COMPAZINE) 10 MG tablet Take 1 tablet (10 mg total) by mouth every 6 (six) hours as needed (Nausea or vomiting). 30 tablet 1   simvastatin (ZOCOR) 40 MG tablet Take 1 tablet (40 mg total) by mouth daily.  90 tablet 2   valACYclovir (VALTREX) 1000 MG tablet Take 1 tablet (1,000 mg total) by mouth 2 (two) times daily. 90 tablet 2   No current facility-administered medications for this visit.    OBJECTIVE: White woman who appears stated age  72:   08/30/21 1023  BP: (!) 181/81  Pulse: 61  Resp: 19  Temp: 97.8 F (36.6 C)  SpO2: 99%      Body mass index is 23.76 kg/m.   Wt Readings from Last 3 Encounters:  08/30/21 129 lb 14.4 oz (58.9 kg)  08/09/21 131 lb 3.2 oz (59.5 kg)  07/19/21 131 lb 9.6 oz (59.7 kg)     ECOG FS:1 - Symptomatic but completely ambulatory  Sclerae unicteric, EOMs intact Wearing a mask No cervical or supraclavicular adenopathy Lungs no rales or rhonchi Heart regular rate and rhythm Abd soft, nontender, positive bowel sounds MSK no focal spinal tenderness, no upper extremity lymphedema Neuro: nonfocal, well oriented, appropriate affect Breasts: Deferred   LAB RESULTS:  CMP     Component Value Date/Time   NA 144 08/09/2021 0952   K 3.9 08/09/2021 0952   CL 109 08/09/2021 0952   CO2 27 08/09/2021 0952   GLUCOSE 123 (H) 08/09/2021 0952   BUN 7 (L) 08/09/2021 0952   CREATININE 0.72 08/09/2021 0952   CREATININE 0.72 04/27/2021 1236   CREATININE 0.58 (L) 12/20/2019 1422   CALCIUM 9.4 08/09/2021 0952   PROT 6.8 08/09/2021 0952   ALBUMIN 3.6 08/09/2021 0952   AST 18 08/09/2021 0952   AST 15 04/27/2021 1236   ALT 15 08/09/2021 0952   ALT 11 04/27/2021 1236   ALKPHOS 108 08/09/2021 0952   BILITOT 0.4 08/09/2021 0952   BILITOT 0.3 04/27/2021 1236   GFRNONAA >60 08/09/2021 0952   GFRNONAA >60 04/27/2021 1236    No results found for: TOTALPROTELP, ALBUMINELP, A1GS, A2GS, BETS, BETA2SER, GAMS, MSPIKE, SPEI  Lab Results  Component Value Date   WBC 6.2 08/30/2021   NEUTROABS 3.5 08/30/2021   HGB 11.9 (L) 08/30/2021   HCT 36.7 08/30/2021   MCV 94.6 08/30/2021   PLT 264 08/30/2021    No results found for: LABCA2  No components found for:  BSWHQP591  No results for input(s): INR in the last 168 hours.  No results found for: LABCA2  No results found for: MBW466  No results found for: ZLD357  No results found for: SVX793  No results found for: JQ3009  No components found for: HGQUANT  No results found for: CEA1 / No results found for: CEA1   No results found for: AFPTUMOR  No results found for: CHROMOGRNA  No results found for: KPAFRELGTCHN, LAMBDASER, KAPLAMBRATIO (kappa/lambda light chains)  No results found for: HGBA, HGBA2QUANT, HGBFQUANT, HGBSQUAN (Hemoglobinopathy evaluation)   No results found for: LDH  No results found for: IRON, TIBC, IRONPCTSAT (Iron and TIBC)  No results found for: FERRITIN  Urinalysis No results found for: COLORURINE, APPEARANCEUR, LABSPEC, PHURINE, GLUCOSEU, HGBUR, BILIRUBINUR, KETONESUR, PROTEINUR, UROBILINOGEN, NITRITE, LEUKOCYTESUR   STUDIES: No results found.   ELIGIBLE FOR AVAILABLE RESEARCH PROTOCOL: no  ASSESSMENT: 75 y.o. [place], [place] woman status post left breast upper outer quadrant biopsy 12/08/2020 for a clinical T2N0, stage IIA invasive ductal carcinoma, grade 3, estrogen and progesterone receptor positive, HER-2 not amplified, with an MIB-1 of 30%  (1) Oncotype obtained from the original biopsy shows a score of 32, predicting a risk of recurrence outside the breast in the next 9 years of 20% if node-negative, 25% if node positive, if antiestrogens are the only systemic treatment, also predicting a greater than 15% benefit from chemotherapy  (2) genetics testing 01/01/2021 through the [facility]Next-Cancer +RNAinsight Panel found no deleterious mutations in APC, ATM, AXIN2, BARD1, BMPR1A, BRCA1, BRCA2, BRIP1, CDH1, CDK4, CDKN2A, CHEK2, DICER1, EPCAM, GREM1, HOXB13, MEN1, MLH1, MSH2, MSH3, MSH6, MUTYH, NBN, NF1, NF2, NTHL1, PALB2, PMS2, POLD1, POLE, PTEN, RAD51C, RAD51D, RECQL, RET, SDHA, SDHAF2, SDHB, SDHC, SDHD, SMAD4, SMARCA4, STK11, TP53, TSC1, TSC2, and VHL.   RNA data is routinely analyzed for use in variant interpretation for all genes.  (3) anastrozole started neoadjuvantly 12/16/2020, discontinued 03/2021 with multiple side effects  (4) status post left mastectomy and sentinel lymph node sampling 04/07/2021 for a pT2 pN0, stage IIA invasive ductal carcinoma, grade 2, with negative margins  (A) a single left axilla lymph node removed  (B) repeat prognostic panel finds the cancer estrogen receptor positive, progesterone receptor and HER2 negative  (C) palpable lesion in the left breast consistent with evolving 0.5 oil cyst/fat necrosis by ultrasonography at [facility] on 07/20/2021  (5) adjuvant chemotherapy with cyclophosphamide, methotrexate and fluorouracil (CMF) starting 05/17/2021, to be repeated every 21 days times 8  (6) adjuvant tamoxifen to follow   PLAN: Kysa continues to do generally well with CMF and she will receive the sixth of 8 planned cycles today.  I am making no changes in doses or treatment interval.  She does have some crackles at the left base and I am adding a chest x-ray at the completion of treatment.  In the absence of fever or other symptoms I do not think we need to total antibiotics.  She already has an appointment to see me in 3 weeks.  She knows to call for any other issue that may develop before then.  Total encounter time 20 minutes.Sarajane Jews C. Dyllan Hughett, MD 08/30/2021 10:36 AM Medical Oncology and Hematology [facility] [place], [name] [address] Tel. [phone]    Fax. [phone]   This document serves as a record of services personally performed by Lurline Del, MD. It was created on his behalf by Wilburn Mylar, a trained medical scribe. The creation of this record is based on the scribe's personal observations and the provider's statements to them.   I, Lurline Del MD, have reviewed the above documentation for accuracy and completeness, and I agree with the  above.   *Total Encounter Time as defined  by the Centers for Medicare and Medicaid Services includes, in addition to the face-to-face time of a patient visit (documented in the note above) non-face-to-face time: obtaining and reviewing outside history, ordering and reviewing medications, tests or procedures, care coordination (communications with other health care professionals or caregivers) and documentation in the medical record.

## 2021-08-30 ENCOUNTER — Encounter: Payer: Self-pay | Admitting: Oncology

## 2021-08-30 ENCOUNTER — Inpatient Hospital Stay: Payer: Medicare HMO

## 2021-08-30 ENCOUNTER — Inpatient Hospital Stay: Payer: Medicare HMO | Admitting: Oncology

## 2021-08-30 ENCOUNTER — Ambulatory Visit (HOSPITAL_COMMUNITY)
Admission: RE | Admit: 2021-08-30 | Discharge: 2021-08-30 | Disposition: A | Discharge status: Home / Self Care | Payer: Medicare HMO | Source: Ambulatory Visit | Attending: Oncology | Admitting: Oncology

## 2021-08-30 ENCOUNTER — Other Ambulatory Visit: Payer: Self-pay

## 2021-08-30 VITALS — BP 181/81 | HR 61 | Temp 97.8°F | Resp 19 | Ht 62.0 in | Wt 129.9 lb

## 2021-08-30 VITALS — BP 146/72 | HR 58

## 2021-08-30 DIAGNOSIS — Z170 Estrogen receptor positive status [ER+]: Secondary | ICD-10-CM

## 2021-08-30 DIAGNOSIS — R059 Cough, unspecified: Secondary | ICD-10-CM | POA: Diagnosis not present

## 2021-08-30 DIAGNOSIS — C50412 Malignant neoplasm of upper-outer quadrant of left female breast: Secondary | ICD-10-CM | POA: Insufficient documentation

## 2021-08-30 DIAGNOSIS — Z95828 Presence of other vascular implants and grafts: Secondary | ICD-10-CM

## 2021-08-30 DIAGNOSIS — Z5111 Encounter for antineoplastic chemotherapy: Secondary | ICD-10-CM | POA: Diagnosis not present

## 2021-08-30 DIAGNOSIS — K449 Diaphragmatic hernia without obstruction or gangrene: Secondary | ICD-10-CM | POA: Diagnosis not present

## 2021-08-30 LAB — COMPREHENSIVE METABOLIC PANEL
ALT: 11 U/L (ref 0–44)
AST: 15 U/L (ref 15–41)
Albumin: 3.5 g/dL (ref 3.5–5.0)
Alkaline Phosphatase: 108 U/L (ref 38–126)
Anion gap: 8 (ref 5–15)
BUN: 8 mg/dL (ref 8–23)
CO2: 26 mmol/L (ref 22–32)
Calcium: 8.2 mg/dL — ABNORMAL LOW (ref 8.9–10.3)
Chloride: 108 mmol/L (ref 98–111)
Creatinine, Ser: 0.68 mg/dL (ref 0.44–1.00)
GFR, Estimated: >60 mL/min (ref >60)
Glucose, Bld: 159 mg/dL — ABNORMAL HIGH (ref 70–99)
Potassium: 3.7 mmol/L (ref 3.5–5.1)
Sodium: 142 mmol/L (ref 135–145)
Total Bilirubin: 0.4 mg/dL (ref 0.3–1.2)
Total Protein: 6.5 g/dL (ref 6.5–8.1)

## 2021-08-30 LAB — CBC WITH DIFFERENTIAL/PLATELET
Abs Immature Granulocytes: 0.06 10*3/uL (ref 0.00–0.07)
Basophils Absolute: 0.1 10*3/uL (ref 0.0–0.1)
Basophils Relative: 1 %
Eosinophils Absolute: 0.6 10*3/uL — ABNORMAL HIGH (ref 0.0–0.5)
Eosinophils Relative: 9 %
HCT: 36.7 % (ref 36.0–46.0)
Hemoglobin: 11.9 g/dL — ABNORMAL LOW (ref 12.0–15.0)
Immature Granulocytes: 1 %
Lymphocytes Relative: 21 %
Lymphs Abs: 1.3 10*3/uL (ref 0.7–4.0)
MCH: 30.7 pg (ref 26.0–34.0)
MCHC: 32.4 g/dL (ref 30.0–36.0)
MCV: 94.6 fL (ref 80.0–100.0)
Monocytes Absolute: 0.8 10*3/uL (ref 0.1–1.0)
Monocytes Relative: 12 %
Neutro Abs: 3.5 10*3/uL (ref 1.7–7.7)
Neutrophils Relative %: 56 %
Platelets: 264 10*3/uL (ref 150–400)
RBC: 3.88 MIL/uL (ref 3.87–5.11)
RDW: 16.6 % — ABNORMAL HIGH (ref 11.5–15.5)
WBC: 6.2 10*3/uL (ref 4.0–10.5)
nRBC: 0 % (ref 0.0–0.2)

## 2021-08-30 MED ORDER — HEPARIN SOD (PORK) LOCK FLUSH 100 UNIT/ML IV SOLN
500.0000 [IU] | Freq: Once | INTRAVENOUS | Status: AC | PRN
  Administered 2021-08-30: 500 [IU]

## 2021-08-30 MED ORDER — FLUOROURACIL CHEMO INJECTION 2.5 GM/50ML
600.0000 mg/m2 | Freq: Once | INTRAVENOUS | Status: AC
  Administered 2021-08-30: 950 mg via INTRAVENOUS
  Filled 2021-08-30: qty 19

## 2021-08-30 MED ORDER — SODIUM CHLORIDE 0.9 % IV SOLN: Freq: Once | INTRAVENOUS | Status: AC

## 2021-08-30 MED ORDER — METHOTREXATE SODIUM (PF) CHEMO INJECTION 250 MG/10ML
40.1000 mg/m2 | Freq: Once | INTRAMUSCULAR | Status: AC
  Administered 2021-08-30: 65 mg via INTRAVENOUS
  Filled 2021-08-30: qty 2.6

## 2021-08-30 MED ORDER — SODIUM CHLORIDE 0.9% FLUSH
10.0000 mL | INTRAVENOUS | Status: DC | PRN
  Administered 2021-08-30: 10 mL

## 2021-08-30 MED ORDER — SODIUM CHLORIDE 0.9 % IV SOLN
150.0000 mg | Freq: Once | INTRAVENOUS | Status: AC
  Administered 2021-08-30: 150 mg via INTRAVENOUS
  Filled 2021-08-30: qty 150

## 2021-08-30 MED ORDER — SODIUM CHLORIDE 0.9 % IV SOLN
600.0000 mg/m2 | Freq: Once | INTRAVENOUS | Status: AC
  Administered 2021-08-30: 980 mg via INTRAVENOUS
  Filled 2021-08-30: qty 49

## 2021-08-30 MED ORDER — SODIUM CHLORIDE 0.9% FLUSH
10.0000 mL | Freq: Once | INTRAVENOUS | Status: AC
  Administered 2021-08-30: 10 mL

## 2021-08-30 MED ORDER — SODIUM CHLORIDE 0.9 % IV SOLN
10.0000 mg | Freq: Once | INTRAVENOUS | Status: AC
  Administered 2021-08-30: 10 mg via INTRAVENOUS
  Filled 2021-08-30: qty 10

## 2021-08-30 NOTE — Patient Instructions (Signed)
[facility]  Discharge Instructions: Thank you for choosing [facility] to provide your oncology and hematology care.   If you have a lab appointment with the [facility], please go directly to the [facility] and check in at the registration area.   Wear comfortable clothing and clothing appropriate for easy access to any Portacath or PICC line.   We strive to give you quality time with your provider. You may need to reschedule your appointment if you arrive late (15 or more minutes).  Arriving late affects you and other patients whose appointments are after yours.  Also, if you miss three or more appointments without notifying the office, you may be dismissed from the clinic at the provider's discretion.      For prescription refill requests, have your pharmacy contact our office and allow 72 hours for refills to be completed.    Today you received the following chemotherapy and/or immunotherapy agents :  Cyclophosphamide, Methotrexate, Fluorouracil.       To help prevent nausea and vomiting after your treatment, we encourage you to take your nausea medication as directed.  BELOW ARE SYMPTOMS THAT SHOULD BE REPORTED IMMEDIATELY: *FEVER GREATER THAN 100.4 F (38 C) OR HIGHER *CHILLS OR SWEATING *NAUSEA AND VOMITING THAT IS NOT CONTROLLED WITH YOUR NAUSEA MEDICATION *UNUSUAL SHORTNESS OF BREATH *UNUSUAL BRUISING OR BLEEDING *URINARY PROBLEMS (pain or burning when urinating, or frequent urination) *BOWEL PROBLEMS (unusual diarrhea, constipation, pain near the anus) TENDERNESS IN MOUTH AND THROAT WITH OR WITHOUT PRESENCE OF ULCERS (sore throat, sores in mouth, or a toothache) UNUSUAL RASH, SWELLING OR PAIN  UNUSUAL VAGINAL DISCHARGE OR ITCHING   Items with * indicate a potential emergency and should be followed up as soon as possible or go to the Emergency Department if any problems should occur.  Please show the CHEMOTHERAPY ALERT CARD or  IMMUNOTHERAPY ALERT CARD at check-in to the Emergency Department and triage nurse.  Should you have questions after your visit or need to cancel or reschedule your appointment, please contact [facility]  Dept: [phone]  and follow the prompts.  Office hours are 8:00 a.m. to 4:30 p.m. Monday - Friday. Please note that voicemails left after 4:00 p.m. may not be returned until the following business day.  We are closed weekends and major holidays. You have access to a nurse at all times for urgent questions. Please call the main number to the clinic Dept: [phone] and follow the prompts.   For any non-urgent questions, you may also contact your provider using MyChart. We now offer e-Visits for anyone 66 and older to request care online for non-urgent symptoms. For details visit [email or web address].   Also download the MyChart app! Go to the app store, search "MyChart", open the app, select [facility], and log in with your MyChart username and password.  Due to Covid, a mask is required upon entering the hospital/clinic. If you do not have a mask, one will be given to you upon arrival. For doctor visits, patients may have 1 support person aged 70 or older with them. For treatment visits, patients cannot have anyone with them due to current Covid guidelines and our immunocompromised population.

## 2021-08-31 ENCOUNTER — Encounter: Payer: Self-pay | Admitting: Gastroenterology

## 2021-08-31 ENCOUNTER — Ambulatory Visit (INDEPENDENT_AMBULATORY_CARE_PROVIDER_SITE_OTHER): Payer: Medicare HMO | Admitting: Gastroenterology

## 2021-08-31 VITALS — BP 140/64 | HR 60 | Ht 62.0 in | Wt 129.2 lb

## 2021-08-31 DIAGNOSIS — K219 Gastro-esophageal reflux disease without esophagitis: Secondary | ICD-10-CM | POA: Diagnosis not present

## 2021-08-31 DIAGNOSIS — Z86010 Personal history of colonic polyps: Secondary | ICD-10-CM | POA: Diagnosis not present

## 2021-08-31 DIAGNOSIS — Z800 Family history of malignant neoplasm of digestive organs: Secondary | ICD-10-CM | POA: Diagnosis not present

## 2021-08-31 DIAGNOSIS — R1310 Dysphagia, unspecified: Secondary | ICD-10-CM

## 2021-08-31 MED ORDER — PANTOPRAZOLE SODIUM 40 MG PO TBEC: 40.0000 mg | DELAYED_RELEASE_TABLET | Freq: Two times a day (BID) | ORAL | 3 refills | Status: DC

## 2021-08-31 NOTE — Progress Notes (Signed)
Chief Complaint: GERD, Barrett's esophagus, hiatal hernia   Referring Provider:     [name], DO    HPI:     Gina Hunt is a 75 y.o. female with a history of breast cancer s/p left mastectomy and currently undergoing adjuvant chemotherapy (scheduled to finish in December per patient then transition to tamoxifen), hyperlipidemia, hypothyroidism, fibromyalgia, GERD, referred to the Gastroenterology Clinic for evaluation of reflux sxs.   Has a long-standing hx of GERD, but increasing heartburn over the last month. Has a metallic taste as well (which she attributes to chemotherapy).  Reflux symptoms tend to be post prandial, w/o identified trigger foods. Takes omeprazole 40 mg QAM for many years, but more recently started taking Tums 2-3 times/day. Occasional dysphagia to liquids > solids ("it sticks and I have to cough it up"), but not hx of food impactions.  EGD many years ago at [facility] and no report in EMR for review, but she was told she has Barrett's Esophagus.   Brother recently died with Esophageal and Stomach CA.   CXR performed yesterday for evaluation of cough and notable for hiatal hernia and otherwise negative for acute cardiopulmonary disease.  Reviewed labs from 08/30/2021: Stable H/H at 11.9/36.7 with otherwise normal WBC, PLT, CMP.   Endoscopic History: - Colonoscopy (11/2015): Inadequate prep.  7 mm polyp removed from 5 cm into the rectum (path benign).  Recommended repeat in 3 years due to inadequate prep. - Colonoscopy (05/2019): 6 mm cecal polyp (path: Tubular adenoma), transverse/sigmoid diverticulosis, moderately tortuous.  Repeat in 7 years (vs no repeat due to age)   Past Medical History:  Diagnosis Date   Anxiety    Breast cancer ([place]) 01/2021   left breast IDC   Complication of anesthesia    Family history of breast cancer 12/16/2020   Family history of gastric cancer 12/16/2020   Fibromyalgia    GERD (gastroesophageal  reflux disease)    Hyperlipidemia    Hypothyroidism    Osteoarthritis    knees, hands, fingers   Osteoarthritis    PONV (postoperative nausea and vomiting)    Skin cancer      Past Surgical History:  Procedure Laterality Date   ABDOMINAL HYSTERECTOMY     COLONOSCOPY  11/24/2015   EVACUATION BREAST HEMATOMA Left 04/07/2021   Procedure: EVACUATION HEMATOMA BREAST;  Surgeon: Rolm Bookbinder, MD;  Location: [place];  Service: General;  Laterality: Left;   LIVER SURGERY     PORTA CATH INSERTION Right 04/07/2021   Procedure: PORTA CATH INSERTION;  Surgeon: Rolm Bookbinder, MD;  Location: [place];  Service: General;  Laterality: Right;   SIMPLE MASTECTOMY WITH AXILLARY SENTINEL NODE BIOPSY Left 04/07/2021   Procedure: LEFT SIMPLE MASTECTOMY WITH LEFT AXILLARY SENTINEL NODE BIOPSY;  Surgeon: Rolm Bookbinder, MD;  Location: [place];  Service: General;  Laterality: Left;   TONSILLECTOMY     Family History  Problem Relation Age of Onset   Hypertension Mother    Diabetes Mother    Arthritis Mother    Heart disease Mother    Rheum arthritis Father    Diabetes Father    Heart disease Father    Stomach cancer Brother 74   Stomach cancer Brother 37   Esophageal cancer Brother 68   Breast cancer Maternal Aunt 70   Breast cancer Maternal Aunt 72   Breast cancer Maternal Aunt 73   Colon  cancer Neg Hx    Colon polyps Neg Hx    Social History   Tobacco Use   Smoking status: Never   Smokeless tobacco: Never  Vaping Use   Vaping Use: Never used  Substance Use Topics   Alcohol use: Yes    Comment: rarely    Drug use: Never   Current Outpatient Medications  Medication Sig Dispense Refill   dexamethasone (DECADRON) 4 MG tablet Take 2 tablets (8 mg total) by mouth 2 (two) times daily. Start the day after chemotherapy for 2 days. Take with food. 30 tablet 1   DULoxetine (CYMBALTA) 60 MG capsule Take 1 capsule (60 mg total) by mouth  2 (two) times daily. 180 capsule 2   gabapentin (NEURONTIN) 300 MG capsule Take 1 capsule (300 mg total) by mouth 3 (three) times daily. 270 capsule 1   levothyroxine (SYNTHROID) 50 MCG tablet Take 1 tablet (50 mcg total) by mouth daily before breakfast. 90 tablet 2   lidocaine-prilocaine (EMLA) cream Apply to affected area once 30 g 3   magic mouthwash w/lidocaine SOLN Take 5 mLs by mouth 4 (four) times daily as needed for mouth pain. 240 mL 1   methocarbamol (ROBAXIN) 500 MG tablet Take 1 tablet (500 mg total) by mouth every 6 (six) hours as needed for muscle spasms. 20 tablet 0   omeprazole (PRILOSEC) 40 MG capsule Take 1 capsule (40 mg total) by mouth daily. 90 capsule 2   prochlorperazine (COMPAZINE) 10 MG tablet Take 1 tablet (10 mg total) by mouth every 6 (six) hours as needed (Nausea or vomiting). 30 tablet 1   simvastatin (ZOCOR) 40 MG tablet Take 1 tablet (40 mg total) by mouth daily. 90 tablet 2   valACYclovir (VALTREX) 1000 MG tablet Take 1 tablet (1,000 mg total) by mouth 2 (two) times daily. 90 tablet 2   No current facility-administered medications for this visit.   Allergies  Allergen Reactions   Codeine Other (See Comments)    Hallucination    Morphine And Related Other (See Comments)    Knocks me out per pt     Review of Systems: All systems reviewed and negative except where noted in HPI.     Physical Exam:    Wt Readings from Last 3 Encounters:  08/31/21 129 lb 4 oz (58.6 kg)  08/30/21 129 lb 14.4 oz (58.9 kg)  08/09/21 131 lb 3.2 oz (59.5 kg)    BP 140/64   Pulse 60   Ht 5\' 2"  (1.575 m)   Wt 129 lb 4 oz (58.6 kg)   SpO2 98%   BMI 23.64 kg/m  Constitutional:  Pleasant, in no acute distress. Psychiatric: Normal mood and affect. Behavior is normal. Cardiovascular: Normal rate, regular rhythm. No edema Pulmonary/chest: Effort normal and breath sounds normal. No wheezing, rales or rhonchi. Abdominal: Soft, nondistended, nontender. Bowel sounds active  throughout. There are no masses palpable. No hepatomegaly. Neurological: Alert and oriented to person place and time. Skin: Skin is warm and dry. No rashes noted.   ASSESSMENT AND PLAN;   1) GERD 2) Dysphagia Discussed diagnostic and treatment options at length today to include 1) continued high-dose PPI for now, 2) esophagram +/- MBS, and 3) EGD.  Based on current health status, she strongly prefers option 1 - Continue Protonix 40 mg BID - RTC in 4 weeks.  If reflux symptoms well controlled, can plan for dose reduction to lowest effective dose - Continue antireflux lifestyle/dietary modifications - Tentative plan for EGD at  a later date after completion of adjuvant chemotherapy and pending clinical response  3) Family history of Esophageal Cancer - Patient with possible history of Barrett's Esophagus diagnosed on EGD years ago at [place].  Brother recently deceased with esophageal (and stomach?)  Cancer - Tentative plan for EGD later today after completion of adjuvant chemotherapy per patient wishes  4) History of colon polyps - Colonoscopy 2020 with single subcentimeter tubular adenoma.  Per guidelines, can consider repeat colonoscopy 2027, although patient has repeatedly said she does not wish to pursue ongoing polyp surveillance due to age  RTC in 4 weeks or sooner as needed  I spent 35 minutes of time, including in depth chart review, independent review of results as outlined above, communicating results with the patient directly, face-to-face time with the patient, coordinating care, and ordering studies and medications as appropriate, and documentation.    Lavena Bullion, DO, FACG  08/31/2021, 2:22 PM   [place], [place]*

## 2021-08-31 NOTE — Patient Instructions (Signed)
If you are age 75 or older, your body mass index should be between 23-30. Your Body mass index is 23.64 kg/m. If this is out of the aforementioned range listed, please consider follow up with your Primary Care Provider.  If you are age 31 or younger, your body mass index should be between 19-25. Your Body mass index is 23.64 kg/m. If this is out of the aformentioned range listed, please consider follow up with your Primary Care Provider.   __________________________________________________________  The [facility] providers would like to encourage you to use [facility] to communicate with providers for non-urgent requests or questions.  Due to long hold times on the telephone, sending your provider a message by [facility] may be a faster and more efficient way to get a response.  Please allow 48 business hours for a response.  Please remember that this is for non-urgent requests.   Due to recent changes in healthcare laws, you may see the results of your imaging and laboratory studies on MyChart before your provider has had a chance to review them.  We understand that in some cases there may be results that are confusing or concerning to you. Not all laboratory results come back in the same time frame and the provider may be waiting for multiple results in order to interpret others.  Please give [place] 48 hours in order for your provider to thoroughly review all the results before contacting the office for clarification of your results.   You have been scheduled for a follow up appointment on 10/26/2021 at 1:20.   Please stop taking omeprazole and start Protonix twice daily for 4 weeks then 1 time a day thereafter.  Thank you for choosing me and [facility].  Vito Cirigliano, D.O.

## 2021-09-01 DIAGNOSIS — M25612 Stiffness of left shoulder, not elsewhere classified: Secondary | ICD-10-CM | POA: Diagnosis not present

## 2021-09-01 DIAGNOSIS — M25512 Pain in left shoulder: Secondary | ICD-10-CM | POA: Diagnosis not present

## 2021-09-14 DIAGNOSIS — M25612 Stiffness of left shoulder, not elsewhere classified: Secondary | ICD-10-CM | POA: Diagnosis not present

## 2021-09-14 DIAGNOSIS — M25512 Pain in left shoulder: Secondary | ICD-10-CM | POA: Diagnosis not present

## 2021-09-16 ENCOUNTER — Encounter: Payer: Self-pay | Admitting: *Deleted

## 2021-09-16 DIAGNOSIS — M25612 Stiffness of left shoulder, not elsewhere classified: Secondary | ICD-10-CM | POA: Diagnosis not present

## 2021-09-16 DIAGNOSIS — M25512 Pain in left shoulder: Secondary | ICD-10-CM | POA: Diagnosis not present

## 2021-09-17 MED FILL — Fosaprepitant Dimeglumine For IV Infusion 150 MG (Base Eq): INTRAVENOUS | Qty: 5 | Status: AC

## 2021-09-17 MED FILL — Dexamethasone Sodium Phosphate Inj 100 MG/10ML: INTRAMUSCULAR | Qty: 1 | Status: AC

## 2021-09-19 NOTE — Progress Notes (Signed)
[place]  Telephone:(336) [phone] Fax:(336) [phone]     ID: Gina Hunt DOB: 1946/05/20  MR#: [id number]  NAT#:[id number]  Patient Care Team: [name], DO as PCP - General (Family Medicine) Rockwell Germany, RN as Oncology Nurse Navigator Mauro Kaufmann, RN as Oncology Nurse Navigator Rolm Bookbinder, MD as Consulting Physician (General Surgery) Gianne Shugars, [name], MD as Consulting Physician (Oncology) Kyung Rudd, MD as Consulting Physician (Radiation Oncology) Sheryn Bison, MD as Consulting Physician (Dermatology) Chauncey Cruel, MD OTHER MD:   CHIEF COMPLAINT: Estrogen receptor positive breast cancer (s/p left mastectomy)  CURRENT TREATMENT: Adjuvant chemotherapy   INTERVAL HISTORY: [name] returns today for follow up and treatment of her estrogen receptor positive breast cancer.  She is by herself today, which is unusual.  She says "they were busy".  In general that is a good sign--it means they are not worried about her.  Oluwademilade began adjuvant CMF on 05/17/2021.  Today is day 1 cycle 7.   REVIEW OF SYSTEMS: [name] generally does well with her chemo.  Her hair has thinned and she says she is embarrassed about it.  She gets some loose bowel movements and a little bit of a headache and generally does not feel very good for a few days but by the second and third week she is feeling much better.  She is got big plans for Thanksgiving's.  A detailed review of systems today was otherwise stable.   COVID 19 VACCINATION STATUS: [place] x2, booster September 2022   HISTORY OF CURRENT ILLNESS: From the original intake note:  Gina Hunt (pronounced "Erlene Quan") had routine screening mammography on 11/24/2020 showing a possible abnormality in the left breast. She underwent left breast ultrasonography at [facility] on 12/02/2020 showing: breast density category A; 2.5 cm mass in left breast at 2 o'clock (measuring 1.7 cm in ultrasound); no abnormal  left axillary lymph nodes.  Accordingly on 12/08/2020 she proceeded to biopsy of the left breast area in question. The pathology from this procedure ([id number]) showed: invasive ductal carcinoma, grade 3. Prognostic indicators significant for: estrogen receptor, 95% positive with strong staining intensity and progesterone receptor, 80% positive with moderate staining intensity. Proliferation marker Ki67 at 30%. HER2 equivocal by immunohistochemistry (2+), but negative by fluorescent in situ hybridization with a signals ratio 1.64 and number per cell 2.95.   Cancer Staging  Malignant neoplasm of upper-outer quadrant of left breast in female, estrogen receptor positive ([place]) Staging form: Breast, AJCC 8th Edition - Clinical stage from 12/16/2020: Stage IIA (cT2, cN0, cM0, G3, ER+, PR+, HER2-) - Signed by Chauncey Cruel, MD on 12/16/2020 Stage prefix: Initial diagnosis  The patient's subsequent history is as detailed below.   PAST MEDICAL HISTORY: Past Medical History:  Diagnosis Date   Anxiety    Breast cancer ([name]) 01/2021   left breast IDC   Complication of anesthesia    Family history of breast cancer 12/16/2020   Family history of gastric cancer 12/16/2020   Fibromyalgia    GERD (gastroesophageal reflux disease)    Hyperlipidemia    Hypothyroidism    Osteoarthritis    knees, hands, fingers   Osteoarthritis    PONV (postoperative nausea and vomiting)    Skin cancer     PAST SURGICAL HISTORY: Past Surgical History:  Procedure Laterality Date   ABDOMINAL HYSTERECTOMY     COLONOSCOPY  11/24/2015   EVACUATION BREAST HEMATOMA Left 04/07/2021   Procedure: EVACUATION HEMATOMA BREAST;  Surgeon: Rolm Bookbinder, MD;  Location: [place];  Service: General;  Laterality: Left;   LIVER SURGERY     PORTA CATH INSERTION Right 04/07/2021   Procedure: PORTA CATH INSERTION;  Surgeon: Rolm Bookbinder, MD;  Location: [place];  Service: General;  Laterality:  Right;   SIMPLE MASTECTOMY WITH AXILLARY SENTINEL NODE BIOPSY Left 04/07/2021   Procedure: LEFT SIMPLE MASTECTOMY WITH LEFT AXILLARY SENTINEL NODE BIOPSY;  Surgeon: Rolm Bookbinder, MD;  Location: [place];  Service: General;  Laterality: Left;   TONSILLECTOMY      FAMILY HISTORY: Family History  Problem Relation Age of Onset   Hypertension Mother    Diabetes Mother    Arthritis Mother    Heart disease Mother    Rheum arthritis Father    Diabetes Father    Heart disease Father    Stomach cancer Brother 41   Stomach cancer Brother 79   Esophageal cancer Brother 27   Breast cancer Maternal Aunt 70   Breast cancer Maternal Aunt 72   Breast cancer Maternal Aunt 73   Colon cancer Neg Hx    Colon polyps Neg Hx   Her father and mother both died from MI, father age 69 and mother age 42. Bellamie has 6 brothers and 1 sister. She reports breast cancer in 3 maternal aunts, one in their 62's. One of her maternal aunt's daughter (Michalene's cousin) had breast cancer at age 61.The patient also reports stomach cancer in two of her brothers in their 71's, one of whom also had esophageal cancer.    GYNECOLOGIC HISTORY:  No LMP recorded. Patient has had a hysterectomy. Menarche: 75 Age at first live birth: 75 years old [place] P 1 LMP age 75 Contraceptive: never used HRT never used  Hysterectomy? yes BSO? yes   SOCIAL HISTORY: (updated 12/2020)  [name] is currently retired from working at the [employer] in [place] died late February 4665 from complications of Agent Orange exposure. She lives by herself. Daughter [name] lives in [place], the patient believes, but they have been alienated for many years.    ADVANCED DIRECTIVES: not in place; intends to name her niece, [name], as her HCPOA.  The patient was given the appropriate documents to complete and notarized at her discretion on her 12/16/2020 visit   HEALTH MAINTENANCE: Social  History   Tobacco Use   Smoking status: Never   Smokeless tobacco: Never  Vaping Use   Vaping Use: Never used  Substance Use Topics   Alcohol use: Yes    Comment: rarely    Drug use: Never     Colonoscopy: 05/2019 (Dr. Bryan Lemma), recall 2027  PAP: "10 years ago" (~2012)  Bone density: 2020   Allergies  Allergen Reactions   Codeine Other (See Comments)    Hallucination    Morphine And Related Other (See Comments)    Knocks me out per pt    Current Outpatient Medications  Medication Sig Dispense Refill   dexamethasone (DECADRON) 4 MG tablet Take 2 tablets (8 mg total) by mouth 2 (two) times daily. Start the day after chemotherapy for 2 days. Take with food. 30 tablet 1   DULoxetine (CYMBALTA) 60 MG capsule Take 1 capsule (60 mg total) by mouth 2 (two) times daily. 180 capsule 2   gabapentin (NEURONTIN) 300 MG capsule Take 1 capsule (300 mg total) by mouth 3 (three) times daily. 270 capsule 1   levothyroxine (SYNTHROID) 50 MCG tablet Take 1  tablet (50 mcg total) by mouth daily before breakfast. 90 tablet 2   lidocaine-prilocaine (EMLA) cream Apply to affected area once 30 g 3   magic mouthwash w/lidocaine SOLN Take 5 mLs by mouth 4 (four) times daily as needed for mouth pain. 240 mL 1   methocarbamol (ROBAXIN) 500 MG tablet Take 1 tablet (500 mg total) by mouth every 6 (six) hours as needed for muscle spasms. 20 tablet 0   pantoprazole (PROTONIX) 40 MG tablet Take 1 tablet (40 mg total) by mouth 2 (two) times daily. 60 tablet 3   prochlorperazine (COMPAZINE) 10 MG tablet Take 1 tablet (10 mg total) by mouth every 6 (six) hours as needed (Nausea or vomiting). 30 tablet 1   simvastatin (ZOCOR) 40 MG tablet Take 1 tablet (40 mg total) by mouth daily. 90 tablet 2   valACYclovir (VALTREX) 1000 MG tablet Take 1 tablet (1,000 mg total) by mouth 2 (two) times daily. 90 tablet 2   No current facility-administered medications for this visit.    OBJECTIVE: White woman who appears stated  age  75:   09/20/21 1017  BP: (!) 152/74  Pulse: 69  Resp: 18  Temp: 97.9 F (36.6 C)  SpO2: 98%      Body mass index is 23.5 kg/m.   Wt Readings from Last 3 Encounters:  09/20/21 128 lb 8 oz (58.3 kg)  08/31/21 129 lb 4 oz (58.6 kg)  08/30/21 129 lb 14.4 oz (58.9 kg)     ECOG FS:1 - Symptomatic but completely ambulatory  Sclerae unicteric, EOMs intact Wearing a mask No cervical or supraclavicular adenopathy Lungs no rales or rhonchi Heart regular rate and rhythm Abd soft, nontender, positive bowel sounds MSK no focal spinal tenderness, no upper extremity lymphedema Neuro: nonfocal, well oriented, appropriate affect Breasts: Deferred   LAB RESULTS:  CMP     Component Value Date/Time   NA 141 09/20/2021 0950   K 3.8 09/20/2021 0950   CL 108 09/20/2021 0950   CO2 24 09/20/2021 0950   GLUCOSE 226 (H) 09/20/2021 0950   BUN 7 (L) 09/20/2021 0950   CREATININE 0.76 09/20/2021 0950   CREATININE 0.72 04/27/2021 1236   CREATININE 0.58 (L) 12/20/2019 1422   CALCIUM 8.3 (L) 09/20/2021 0950   PROT 6.3 (L) 09/20/2021 0950   ALBUMIN 3.4 (L) 09/20/2021 0950   AST 15 09/20/2021 0950   AST 15 04/27/2021 1236   ALT 12 09/20/2021 0950   ALT 11 04/27/2021 1236   ALKPHOS 101 09/20/2021 0950   BILITOT 0.3 09/20/2021 0950   BILITOT 0.3 04/27/2021 1236   GFRNONAA >60 09/20/2021 0950   GFRNONAA >60 04/27/2021 1236    No results found for: TOTALPROTELP, ALBUMINELP, A1GS, A2GS, BETS, BETA2SER, GAMS, MSPIKE, SPEI  Lab Results  Component Value Date   WBC 5.0 09/20/2021   NEUTROABS 3.0 09/20/2021   HGB 12.1 09/20/2021   HCT 36.4 09/20/2021   MCV 94.3 09/20/2021   PLT 265 09/20/2021    No results found for: LABCA2  No components found for: [id number]  No results for input(s): INR in the last 168 hours.  No results found for: LABCA2  No results found for: ION629  No results found for: BMW413  No results found for: KGM010  No results found for: CA2729  No  components found for: HGQUANT  No results found for: CEA1 / No results found for: CEA1   No results found for: AFPTUMOR  No results found for: [place]  No results found for:  KPAFRELGTCHN, LAMBDASER, KAPLAMBRATIO (kappa/lambda light chains)  No results found for: HGBA, HGBA2QUANT, HGBFQUANT, HGBSQUAN (Hemoglobinopathy evaluation)   No results found for: LDH  No results found for: IRON, TIBC, IRONPCTSAT (Iron and TIBC)  No results found for: FERRITIN  Urinalysis No results found for: COLORURINE, APPEARANCEUR, LABSPEC, PHURINE, GLUCOSEU, HGBUR, BILIRUBINUR, KETONESUR, PROTEINUR, UROBILINOGEN, NITRITE, LEUKOCYTESUR   STUDIES: DG Chest 2 View  Result Date: 08/30/2021 CLINICAL DATA:  75 year old female with cough EXAM: CHEST - 2 VIEW COMPARISON:  Fluoroscopy study 04/07/2021 FINDINGS: Cardiomediastinal silhouette within normal limits in size and contour. No evidence of central vascular congestion. No interlobular septal thickening. Port catheter on the right chest wall with the tip appearing to terminate in the superior vena cava. Double density projects over the lower mediastinum. No pneumothorax or pleural effusion. Coarsened interstitial markings, with no confluent airspace disease. No acute displaced fracture. Degenerative changes of the spine. IMPRESSION: Negative for acute cardiopulmonary disease. Hiatal hernia Electronically Signed   By: Corrie Mckusick D.O.   On: 08/30/2021 16:45     ELIGIBLE FOR AVAILABLE RESEARCH PROTOCOL: no  ASSESSMENT: 75 y.o. [place], [place] woman status post left breast upper outer quadrant biopsy 12/08/2020 for a clinical T2N0, stage IIA invasive ductal carcinoma, grade 3, estrogen and progesterone receptor positive, HER-2 not amplified, with an MIB-1 of 30%  (1) Oncotype obtained from the original biopsy shows a score of 32, predicting a risk of recurrence outside the breast in the next 9 years of 20% if node-negative, 25% if node positive, if  antiestrogens are the only systemic treatment, also predicting a greater than 15% benefit from chemotherapy  (2) genetics testing 01/01/2021 through the [facility] CustomNext-Cancer +RNAinsight Panel found no deleterious mutations in APC, ATM, AXIN2, BARD1, BMPR1A, BRCA1, BRCA2, BRIP1, CDH1, CDK4, CDKN2A, CHEK2, DICER1, EPCAM, GREM1, HOXB13, MEN1, MLH1, MSH2, MSH3, MSH6, MUTYH, NBN, NF1, NF2, NTHL1, PALB2, PMS2, POLD1, POLE, PTEN, RAD51C, RAD51D, RECQL, RET, SDHA, SDHAF2, SDHB, SDHC, SDHD, SMAD4, SMARCA4, STK11, TP53, TSC1, TSC2, and VHL.  RNA data is routinely analyzed for use in variant interpretation for all genes.  (3) anastrozole started neoadjuvantly 12/16/2020, discontinued 03/2021 with multiple side effects  (4) status post left mastectomy and sentinel lymph node sampling 04/07/2021 for a pT2 pN0, stage IIA invasive ductal carcinoma, grade 2, with negative margins  (A) a single left axilla lymph node removed  (B) repeat prognostic panel finds the cancer estrogen receptor positive, progesterone receptor and HER2 negative  (C) palpable lesion in the left breast consistent with evolving 0.5 oil cyst/fat necrosis by ultrasonography at [facility] on 07/20/2021  (5) adjuvant chemotherapy with cyclophosphamide, methotrexate and fluorouracil (CMF) starting 05/17/2021, to be repeated every 21 days times 8  (6) adjuvant tamoxifen to follow   PLAN: Sundae will receive her next to last CMF dose today.  Her counts are excellent.  She maintains a very good functional status.  While she is concerned about her hair thinning this is really barely noticeable to my eye.  I think she has done very well with treatment.  She will see me in 3 weeks for her last dose and at that point we will set her up for a longer term follow-up  She knows to call for any other issue that may develop before then  Total encounter time 20 minutes.Sarajane Jews C. Madaline Lefeber, MD 09/20/2021 10:47 AM Medical Oncology and Hematology [facility] [place], [place] [address] Tel. [phone]    Fax. [phone]   This document  serves as a record of services personally performed by Lurline Del, MD. It was created on his behalf by Wilburn Mylar, a trained medical scribe. The creation of this record is based on the scribe's personal observations and the provider's statements to them.   I, Lurline Del MD, have reviewed the above documentation for accuracy and completeness, and I agree with the above.   *Total Encounter Time as defined by the Centers for Medicare and Medicaid Services includes, in addition to the face-to-face time of a patient visit (documented in the note above) non-face-to-face time: obtaining and reviewing outside history, ordering and reviewing medications, tests or procedures, care coordination (communications with other health care professionals or caregivers) and documentation in the medical record.

## 2021-09-20 ENCOUNTER — Other Ambulatory Visit: Payer: Self-pay

## 2021-09-20 ENCOUNTER — Inpatient Hospital Stay: Payer: Medicare HMO

## 2021-09-20 ENCOUNTER — Inpatient Hospital Stay: Payer: Medicare HMO | Admitting: Oncology

## 2021-09-20 ENCOUNTER — Inpatient Hospital Stay: Payer: Medicare HMO | Attending: Oncology

## 2021-09-20 VITALS — BP 152/74 | HR 69 | Temp 97.9°F | Resp 18 | Ht 62.0 in | Wt 128.5 lb

## 2021-09-20 DIAGNOSIS — C50412 Malignant neoplasm of upper-outer quadrant of left female breast: Secondary | ICD-10-CM | POA: Insufficient documentation

## 2021-09-20 DIAGNOSIS — Z9012 Acquired absence of left breast and nipple: Secondary | ICD-10-CM | POA: Diagnosis not present

## 2021-09-20 DIAGNOSIS — Z5111 Encounter for antineoplastic chemotherapy: Secondary | ICD-10-CM | POA: Diagnosis not present

## 2021-09-20 DIAGNOSIS — Z170 Estrogen receptor positive status [ER+]: Secondary | ICD-10-CM | POA: Diagnosis not present

## 2021-09-20 DIAGNOSIS — Z95828 Presence of other vascular implants and grafts: Secondary | ICD-10-CM

## 2021-09-20 LAB — COMPREHENSIVE METABOLIC PANEL
ALT: 12 U/L (ref 0–44)
AST: 15 U/L (ref 15–41)
Albumin: 3.4 g/dL — ABNORMAL LOW (ref 3.5–5.0)
Alkaline Phosphatase: 101 U/L (ref 38–126)
Anion gap: 9 (ref 5–15)
BUN: 7 mg/dL — ABNORMAL LOW (ref 8–23)
CO2: 24 mmol/L (ref 22–32)
Calcium: 8.3 mg/dL — ABNORMAL LOW (ref 8.9–10.3)
Chloride: 108 mmol/L (ref 98–111)
Creatinine, Ser: 0.76 mg/dL (ref 0.44–1.00)
GFR, Estimated: >60 mL/min (ref >60)
Glucose, Bld: 226 mg/dL — ABNORMAL HIGH (ref 70–99)
Potassium: 3.8 mmol/L (ref 3.5–5.1)
Sodium: 141 mmol/L (ref 135–145)
Total Bilirubin: 0.3 mg/dL (ref 0.3–1.2)
Total Protein: 6.3 g/dL — ABNORMAL LOW (ref 6.5–8.1)

## 2021-09-20 LAB — CBC WITH DIFFERENTIAL/PLATELET
Abs Immature Granulocytes: 0.04 10*3/uL (ref 0.00–0.07)
Basophils Absolute: 0 10*3/uL (ref 0.0–0.1)
Basophils Relative: 1 %
Eosinophils Absolute: 0.3 10*3/uL (ref 0.0–0.5)
Eosinophils Relative: 6 %
HCT: 36.4 % (ref 36.0–46.0)
Hemoglobin: 12.1 g/dL (ref 12.0–15.0)
Immature Granulocytes: 1 %
Lymphocytes Relative: 21 %
Lymphs Abs: 1 10*3/uL (ref 0.7–4.0)
MCH: 31.3 pg (ref 26.0–34.0)
MCHC: 33.2 g/dL (ref 30.0–36.0)
MCV: 94.3 fL (ref 80.0–100.0)
Monocytes Absolute: 0.5 10*3/uL (ref 0.1–1.0)
Monocytes Relative: 11 %
Neutro Abs: 3 10*3/uL (ref 1.7–7.7)
Neutrophils Relative %: 60 %
Platelets: 265 10*3/uL (ref 150–400)
RBC: 3.86 MIL/uL — ABNORMAL LOW (ref 3.87–5.11)
RDW: 15.3 % (ref 11.5–15.5)
WBC: 5 10*3/uL (ref 4.0–10.5)
nRBC: 0 % (ref 0.0–0.2)

## 2021-09-20 MED ORDER — SODIUM CHLORIDE 0.9% FLUSH
10.0000 mL | INTRAVENOUS | Status: DC | PRN
  Administered 2021-09-20: 10 mL

## 2021-09-20 MED ORDER — SODIUM CHLORIDE 0.9 % IV SOLN: Freq: Once | INTRAVENOUS | Status: AC

## 2021-09-20 MED ORDER — SODIUM CHLORIDE 0.9 % IV SOLN
150.0000 mg | Freq: Once | INTRAVENOUS | Status: AC
  Administered 2021-09-20: 150 mg via INTRAVENOUS
  Filled 2021-09-20: qty 150

## 2021-09-20 MED ORDER — SODIUM CHLORIDE 0.9 % IV SOLN
600.0000 mg/m2 | Freq: Once | INTRAVENOUS | Status: AC
  Administered 2021-09-20: 980 mg via INTRAVENOUS
  Filled 2021-09-20: qty 49

## 2021-09-20 MED ORDER — FLUOROURACIL CHEMO INJECTION 2.5 GM/50ML
600.0000 mg/m2 | Freq: Once | INTRAVENOUS | Status: AC
  Administered 2021-09-20: 950 mg via INTRAVENOUS
  Filled 2021-09-20: qty 19

## 2021-09-20 MED ORDER — HEPARIN SOD (PORK) LOCK FLUSH 100 UNIT/ML IV SOLN
500.0000 [IU] | Freq: Once | INTRAVENOUS | Status: AC | PRN
  Administered 2021-09-20: 500 [IU]

## 2021-09-20 MED ORDER — SODIUM CHLORIDE 0.9% FLUSH
10.0000 mL | Freq: Once | INTRAVENOUS | Status: AC
  Administered 2021-09-20: 10 mL

## 2021-09-20 MED ORDER — SODIUM CHLORIDE 0.9 % IV SOLN
10.0000 mg | Freq: Once | INTRAVENOUS | Status: AC
  Administered 2021-09-20: 10 mg via INTRAVENOUS
  Filled 2021-09-20: qty 10

## 2021-09-20 MED ORDER — METHOTREXATE SODIUM (PF) CHEMO INJECTION 250 MG/10ML
40.1000 mg/m2 | Freq: Once | INTRAMUSCULAR | Status: AC
Start: 2021-09-20 — End: 2021-09-20
  Administered 2021-09-20: 65 mg via INTRAVENOUS
  Filled 2021-09-20: qty 2.6

## 2021-09-20 NOTE — Patient Instructions (Signed)
[facility]  Discharge Instructions: Thank you for choosing [facility] to provide your oncology and hematology care.   If you have a lab appointment with the [facility], please go directly to the [facility] and check in at the registration area.   Wear comfortable clothing and clothing appropriate for easy access to any Portacath or PICC line.   We strive to give you quality time with your provider. You may need to reschedule your appointment if you arrive late (15 or more minutes).  Arriving late affects you and other patients whose appointments are after yours.  Also, if you miss three or more appointments without notifying the office, you may be dismissed from the clinic at the provider's discretion.      For prescription refill requests, have your pharmacy contact our office and allow 72 hours for refills to be completed.    Today you received the following chemotherapy and/or immunotherapy agents :  Cyclophosphamide, Methotrexate, Fluorouracil.       To help prevent nausea and vomiting after your treatment, we encourage you to take your nausea medication as directed.  BELOW ARE SYMPTOMS THAT SHOULD BE REPORTED IMMEDIATELY: *FEVER GREATER THAN 100.4 F (38 C) OR HIGHER *CHILLS OR SWEATING *NAUSEA AND VOMITING THAT IS NOT CONTROLLED WITH YOUR NAUSEA MEDICATION *UNUSUAL SHORTNESS OF BREATH *UNUSUAL BRUISING OR BLEEDING *URINARY PROBLEMS (pain or burning when urinating, or frequent urination) *BOWEL PROBLEMS (unusual diarrhea, constipation, pain near the anus) TENDERNESS IN MOUTH AND THROAT WITH OR WITHOUT PRESENCE OF ULCERS (sore throat, sores in mouth, or a toothache) UNUSUAL RASH, SWELLING OR PAIN  UNUSUAL VAGINAL DISCHARGE OR ITCHING   Items with * indicate a potential emergency and should be followed up as soon as possible or go to the Emergency Department if any problems should occur.  Please show the CHEMOTHERAPY ALERT CARD or  IMMUNOTHERAPY ALERT CARD at check-in to the Emergency Department and triage nurse.  Should you have questions after your visit or need to cancel or reschedule your appointment, please contact [facility]  Dept: [phone]  and follow the prompts.  Office hours are 8:00 a.m. to 4:30 p.m. Monday - Friday. Please note that voicemails left after 4:00 p.m. may not be returned until the following business day.  We are closed weekends and major holidays. You have access to a nurse at all times for urgent questions. Please call the main number to the clinic Dept: [phone] and follow the prompts.   For any non-urgent questions, you may also contact your provider using MyChart. We now offer e-Visits for anyone 66 and older to request care online for non-urgent symptoms. For details visit [email or web address].   Also download the MyChart app! Go to the app store, search "MyChart", open the app, select [facility], and log in with your MyChart username and password.  Due to Covid, a mask is required upon entering the hospital/clinic. If you do not have a mask, one will be given to you upon arrival. For doctor visits, patients may have 1 support person aged 70 or older with them. For treatment visits, patients cannot have anyone with them due to current Covid guidelines and our immunocompromised population.

## 2021-10-04 ENCOUNTER — Telehealth: Payer: Self-pay | Admitting: *Deleted

## 2021-10-04 ENCOUNTER — Encounter: Payer: Self-pay | Admitting: Family Medicine

## 2021-10-04 NOTE — Telephone Encounter (Signed)
This RN contacted pt per her call stating concern with noted dizziness, visual changes -and noted increase in her BP readings.  She states she noted symptoms over the weekend- with bp readings ranging in 152/82 to 149/87 with highest 175/98.  She state eyes are " dry and itching " .  Per review with MD - recommend pt to contact her primary MD ( Dr Nani Ravens ) for bp readings for best follow up.  Recommendation for Thera tears ( generic appropriate ) for dry eyes.  Pt verbalized understanding

## 2021-10-08 MED FILL — Fosaprepitant Dimeglumine For IV Infusion 150 MG (Base Eq): INTRAVENOUS | Qty: 5 | Status: AC

## 2021-10-08 MED FILL — Dexamethasone Sodium Phosphate Inj 100 MG/10ML: INTRAMUSCULAR | Qty: 1 | Status: AC

## 2021-10-10 NOTE — Progress Notes (Signed)
[place]  Telephone:([phone]) [phone] Fax:(336) [phone]     ID: MYRLE WANEK DOB: 12-27-1945  MR#: [id number]  MIW#:[id number]  Patient Care Team: [name], DO as PCP - General (Family Medicine) Rockwell Germany, RN as Oncology Nurse Navigator Mauro Kaufmann, RN as Oncology Nurse Navigator Rolm Bookbinder, MD as Consulting Physician (General Surgery) Nare Gaspari, [name], MD as Consulting Physician (Oncology) Kyung Rudd, MD as Consulting Physician (Radiation Oncology) Sheryn Bison, MD as Consulting Physician (Dermatology) Chauncey Cruel, MD OTHER MD:   CHIEF COMPLAINT: Estrogen receptor positive breast cancer (s/p left mastectomy)  CURRENT TREATMENT: Completing adjuvant chemotherapy; to start tamoxifen  INTERVAL HISTORY: Gina Hunt returns today for follow up and treatment of her estrogen receptor positive breast cancer.  She is accompanied by her niece  De began adjuvant CMF on 05/17/2021.  Today is day 1 cycle 8, her final cycle.  She has tolerated treatment generally well.  She says the worst problem she has had is dry eyes dry mouth and dry skin.  She has also felt fatigue.  She never lost her hair although it did thinned a little bit.  REVIEW OF SYSTEMS: A detailed review of systems today was otherwise noncontributory   COVID 19 VACCINATION STATUS: [place] x2, booster September 2022   HISTORY OF CURRENT ILLNESS: From the original intake note:  Gina Hunt (pronounced "Erlene Quan") had routine screening mammography on 11/24/2020 showing a possible abnormality in the left breast. She underwent left breast ultrason[facility] on 12/02/2020 showing: breast density category A; 2.5 cm mass in left breast at 2 o'clock (measuring 1.7 cm in ultrasound); no abnormal left axillary lymph nodes.  Accordingly on 12/08/2020 she proceeded to biopsy of the left breast area in question. The pathology from this procedure ([id number]) showed: invasive  ductal carcinoma, grade 3. Prognostic indicators significant for: estrogen receptor, 95% positive with strong staining intensity and progesterone receptor, 80% positive with moderate staining intensity. Proliferation marker Ki67 at 30%. HER2 equivocal by immunohistochemistry (2+), but negative by fluorescent in situ hybridization with a signals ratio 1.64 and number per cell 2.95.   Cancer Staging  Malignant neoplasm of upper-outer quadrant of left breast in female, estrogen receptor positive ([place]) Staging form: Breast, AJCC 8th Edition - Clinical stage from 12/16/2020: Stage IIA (cT2, cN0, cM0, G3, ER+, PR+, HER2-) - Signed by Chauncey Cruel, MD on 12/16/2020 Stage prefix: Initial diagnosis  The patient's subsequent history is as detailed below.   PAST MEDICAL HISTORY: Past Medical History:  Diagnosis Date   Anxiety    Breast cancer ([place]) 01/2021   left breast IDC   Complication of anesthesia    Family history of breast cancer 12/16/2020   Family history of gastric cancer 12/16/2020   Fibromyalgia    GERD (gastroesophageal reflux disease)    Hyperlipidemia    Hypothyroidism    Osteoarthritis    knees, hands, fingers   Osteoarthritis    PONV (postoperative nausea and vomiting)    Skin cancer     PAST SURGICAL HISTORY: Past Surgical History:  Procedure Laterality Date   ABDOMINAL HYSTERECTOMY     COLONOSCOPY  11/24/2015   EVACUATION BREAST HEMATOMA Left 04/07/2021   Procedure: EVACUATION HEMATOMA BREAST;  Surgeon: Rolm Bookbinder, MD;  Location: [facility];  Service: General;  Laterality: Left;   LIVER SURGERY     PORTA CATH INSERTION Right 04/07/2021   Procedure: PORTA CATH INSERTION;  Surgeon: Rolm Bookbinder, MD;  Location: [facility];  Service: General;  Laterality: Right;   SIMPLE MASTECTOMY WITH AXILLARY SENTINEL NODE BIOPSY Left 04/07/2021   Procedure: LEFT SIMPLE MASTECTOMY WITH LEFT AXILLARY SENTINEL NODE BIOPSY;  Surgeon: Rolm Bookbinder, MD;  Location: [facility];  Service: General;  Laterality: Left;   TONSILLECTOMY      FAMILY HISTORY: Family History  Problem Relation Age of Onset   Hypertension Mother    Diabetes Mother    Arthritis Mother    Heart disease Mother    Rheum arthritis Father    Diabetes Father    Heart disease Father    Stomach cancer Brother 71   Stomach cancer Brother 49   Esophageal cancer Brother 53   Breast cancer Maternal Aunt 70   Breast cancer Maternal Aunt 72   Breast cancer Maternal Aunt 73   Colon cancer Neg Hx    Colon polyps Neg Hx   Her father and mother both died from MI, father age 63 and mother age 31. Gina Hunt has 6 brothers and 1 sister. She reports breast cancer in 3 maternal aunts, one in their 16's. One of her maternal aunt's daughter (Gina Hunt's cousin) had breast cancer at age 50.The patient also reports stomach cancer in two of her brothers in their 72's, one of whom also had esophageal cancer.    GYNECOLOGIC HISTORY:  No LMP recorded. Patient has had a hysterectomy. Menarche: 75 years old Age at first live birth: 75 years old [place] P 1 LMP age 58 Contraceptive: never used HRT never used  Hysterectomy? yes BSO? yes   SOCIAL HISTORY: (updated 12/2020)  Gina Hunt is currently retired from working at the [employer] in [place] died late February 8119 from complications of Agent Orange exposure. She lives by herself. Daughter [name] lives in [place], the patient believes, but they have been alienated for many years.    ADVANCED DIRECTIVES: not in place; intends to name her niece, Gina Hunt, as her HCPOA.  The patient was given the appropriate documents to complete and notarized at her discretion on her 12/16/2020 visit   HEALTH MAINTENANCE: Social History   Tobacco Use   Smoking status: Never   Smokeless tobacco: Never  Vaping Use   Vaping Use: Never used  Substance Use Topics   Alcohol use: Yes    Comment:  rarely    Drug use: Never     Colonoscopy: 05/2019 (Dr. Bryan Lemma), recall 2027  PAP: "10 years ago" (~2012)  Bone density: 2020   Allergies  Allergen Reactions   Codeine Other (See Comments)    Hallucination    Morphine And Related Other (See Comments)    Knocks me out per pt    Current Outpatient Medications  Medication Sig Dispense Refill   cycloSPORINE (RESTASIS) 0.05 % ophthalmic emulsion Place 1 drop into both eyes 2 (two) times daily. 0.4 mL 1   tamoxifen (NOLVADEX) 20 MG tablet Take 1 tablet (20 mg total) by mouth daily. Start October 31, 2021 90 tablet 4   DULoxetine (CYMBALTA) 60 MG capsule Take 1 capsule (60 mg total) by mouth 2 (two) times daily. 180 capsule 2   gabapentin (NEURONTIN) 300 MG capsule Take 1 capsule (300 mg total) by mouth 3 (three) times daily. 270 capsule 1   levothyroxine (SYNTHROID) 50 MCG tablet Take 1 tablet (50 mcg total) by mouth daily before breakfast. 90 tablet 2   methocarbamol (ROBAXIN) 500 MG tablet Take 1 tablet (500 mg total) by mouth every 6 (  six) hours as needed for muscle spasms. 20 tablet 0   pantoprazole (PROTONIX) 40 MG tablet Take 1 tablet (40 mg total) by mouth 2 (two) times daily. 60 tablet 3   simvastatin (ZOCOR) 40 MG tablet Take 1 tablet (40 mg total) by mouth daily. 90 tablet 2   No current facility-administered medications for this visit.    OBJECTIVE: White woman who appears stated age  64:   10/11/21 1002  BP: (!) 167/77  Pulse: 71  Temp: 97.7 F (36.5 C)  SpO2: 97%      Body mass index is 23.03 kg/m.   Wt Readings from Last 3 Encounters:  10/11/21 125 lb 14.4 oz (57.1 kg)  09/20/21 128 lb 8 oz (58.3 kg)  08/31/21 129 lb 4 oz (58.6 kg)     ECOG FS:1 - Symptomatic but completely ambulatory  Sclerae unicteric, EOMs intact Wearing a mask No cervical or supraclavicular adenopathy Lungs no rales or rhonchi Heart regular rate and rhythm Abd soft, nontender, positive bowel sounds MSK no focal spinal  tenderness, no upper extremity lymphedema Neuro: nonfocal, well oriented, appropriate affect Breasts: Deferred   LAB RESULTS:  CMP     Component Value Date/Time   NA 143 10/11/2021 0932   K 3.5 10/11/2021 0932   CL 109 10/11/2021 0932   CO2 24 10/11/2021 0932   GLUCOSE 179 (H) 10/11/2021 0932   BUN 8 10/11/2021 0932   CREATININE 0.73 10/11/2021 0932   CREATININE 0.72 04/27/2021 1236   CREATININE 0.58 (L) 12/20/2019 1422   CALCIUM 8.5 (L) 10/11/2021 0932   PROT 6.5 10/11/2021 0932   ALBUMIN 3.6 10/11/2021 0932   AST 14 (L) 10/11/2021 0932   AST 15 04/27/2021 1236   ALT 10 10/11/2021 0932   ALT 11 04/27/2021 1236   ALKPHOS 106 10/11/2021 0932   BILITOT 0.3 10/11/2021 0932   BILITOT 0.3 04/27/2021 1236   GFRNONAA >60 10/11/2021 0932   GFRNONAA >60 04/27/2021 1236    No results found for: TOTALPROTELP, ALBUMINELP, A1GS, A2GS, BETS, BETA2SER, GAMS, MSPIKE, SPEI  Lab Results  Component Value Date   WBC 4.8 10/11/2021   NEUTROABS 2.7 10/11/2021   HGB 12.3 10/11/2021   HCT 37.6 10/11/2021   MCV 94.9 10/11/2021   PLT 284 10/11/2021    No results found for: LABCA2  No components found for: KGURKY706  No results for input(s): INR in the last 168 hours.  No results found for: LABCA2  No results found for: CBJ628  No results found for: BTD176  No results found for: HYW737  No results found for: CA2729  No components found for: HGQUANT  No results found for: CEA1 / No results found for: CEA1   No results found for: AFPTUMOR  No results found for: CHROMOGRNA  No results found for: KPAFRELGTCHN, LAMBDASER, KAPLAMBRATIO (kappa/lambda light chains)  No results found for: HGBA, HGBA2QUANT, HGBFQUANT, HGBSQUAN (Hemoglobinopathy evaluation)   No results found for: LDH  No results found for: IRON, TIBC, IRONPCTSAT (Iron and TIBC)  No results found for: FERRITIN  Urinalysis No results found for: COLORURINE, APPEARANCEUR, LABSPEC, PHURINE, GLUCOSEU, HGBUR,  BILIRUBINUR, KETONESUR, PROTEINUR, UROBILINOGEN, NITRITE, LEUKOCYTESUR   STUDIES: No results found.   ELIGIBLE FOR AVAILABLE RESEARCH PROTOCOL: no  ASSESSMENT: 75 y.o. [place], [place] woman status post left breast upper outer quadrant biopsy 12/08/2020 for a clinical T2N0, stage IIA invasive ductal carcinoma, grade 3, estrogen and progesterone receptor positive, HER-2 not amplified, with an MIB-1 of 30%  (1) Oncotype obtained from the  original biopsy shows a score of 32, predicting a risk of recurrence outside the breast in the next 9 years of 20% if node-negative, 25% if node positive, if antiestrogens are the only systemic treatment, also predicting a greater than 15% benefit from chemotherapy  (2) genetics testing 01/01/2021 through the [facility]RNAinsight Panel found no deleterious mutations in APC, ATM, AXIN2, BARD1, BMPR1A, BRCA1, BRCA2, BRIP1, CDH1, CDK4, CDKN2A, CHEK2, DICER1, EPCAM, GREM1, HOXB13, MEN1, MLH1, MSH2, MSH3, MSH6, MUTYH, NBN, NF1, NF2, NTHL1, PALB2, PMS2, POLD1, POLE, PTEN, RAD51C, RAD51D, RECQL, RET, SDHA, SDHAF2, SDHB, SDHC, SDHD, SMAD4, SMARCA4, STK11, TP53, TSC1, TSC2, and VHL.  RNA data is routinely analyzed for use in variant interpretation for all genes.  (3) anastrozole started neoadjuvantly 12/16/2020, discontinued 03/2021 with multiple side effects  (4) status post left mastectomy and sentinel lymph node sampling 04/07/2021 for a pT2 pN0, stage IIA invasive ductal carcinoma, grade 2, with negative margins  (A) a single left axilla lymph node removed  (B) repeat prognostic panel finds the cancer estrogen receptor positive, progesterone receptor and HER2 negative  (C) palpable lesion in the left breast consistent with evolving 0.5 oil cyst/fat necrosis by ultrasonography at [facility] on 07/20/2021  (5) adjuvant chemotherapy with cyclophosphamide, methotrexate and fluorouracil (CMF) started 05/17/2021, repeated every 21 days times 8, last dose  10/11/2021  (6) to start tamoxifen 10/31/2021   PLAN: Gina Hunt completes her adjuvant chemotherapy today.  Overall she has tolerated it well although she has had multiple minor symptoms throughout the course.  Right now what is bothering her the most is her dry eyes and I am writing for Restasis.  I hope she will be able to obtain it as I think it would make a significant difference to that particular symptom.  She will not need adjuvant radiation but she will need adjuvant antiestrogens.  She did not tolerate anastrozole.  I gave her again information on tamoxifen today and she has a good understanding of the possible toxicities side effects and complications of this agent.  I wrote the prescription so she can started November 01, 2019.  She will see [place] again a couple of months later to make sure she is tolerating it well  She may have her port removed now at any point.  She knows to call us for any other issue that may develop before the next visit.  Total encounter time 25 minutes.Sarajane Jews C. Zakarie Sturdivant, MD 10/11/2021 10:21 AM Medical Oncology and Hematology [facility] [place], [place] [address] Tel. [phone]    Fax. [phone]   This document serves as a record of services personally performed by Lurline Del, MD. It was created on his behalf by Wilburn Mylar, a trained medical scribe. The creation of this record is based on the scribe's personal observations and the provider's statements to them.   I, Lurline Del MD, have reviewed the above documentation for accuracy and completeness, and I agree with the above.   *Total Encounter Time as defined by the Centers for Medicare and Medicaid Services includes, in addition to the face-to-face time of a patient visit (documented in the note above) non-face-to-face time: obtaining and reviewing outside history, ordering and reviewing medications, tests or procedures, care coordination  (communications with other health care professionals or caregivers) and documentation in the medical record.

## 2021-10-11 ENCOUNTER — Inpatient Hospital Stay: Payer: Medicare HMO

## 2021-10-11 ENCOUNTER — Encounter: Payer: Self-pay | Admitting: *Deleted

## 2021-10-11 ENCOUNTER — Inpatient Hospital Stay: Payer: Medicare HMO | Attending: Oncology | Admitting: Oncology

## 2021-10-11 ENCOUNTER — Other Ambulatory Visit: Payer: Self-pay

## 2021-10-11 VITALS — BP 167/77 | HR 71 | Temp 97.7°F | Wt 125.9 lb

## 2021-10-11 DIAGNOSIS — C50412 Malignant neoplasm of upper-outer quadrant of left female breast: Secondary | ICD-10-CM

## 2021-10-11 DIAGNOSIS — Z23 Encounter for immunization: Secondary | ICD-10-CM | POA: Diagnosis not present

## 2021-10-11 DIAGNOSIS — Z5111 Encounter for antineoplastic chemotherapy: Secondary | ICD-10-CM | POA: Insufficient documentation

## 2021-10-11 DIAGNOSIS — Z170 Estrogen receptor positive status [ER+]: Secondary | ICD-10-CM | POA: Insufficient documentation

## 2021-10-11 DIAGNOSIS — Z95828 Presence of other vascular implants and grafts: Secondary | ICD-10-CM

## 2021-10-11 LAB — CBC WITH DIFFERENTIAL/PLATELET
Abs Immature Granulocytes: 0.03 10*3/uL (ref 0.00–0.07)
Basophils Absolute: 0.1 10*3/uL (ref 0.0–0.1)
Basophils Relative: 1 %
Eosinophils Absolute: 0.3 10*3/uL (ref 0.0–0.5)
Eosinophils Relative: 6 %
HCT: 37.6 % (ref 36.0–46.0)
Hemoglobin: 12.3 g/dL (ref 12.0–15.0)
Immature Granulocytes: 1 %
Lymphocytes Relative: 23 %
Lymphs Abs: 1.1 10*3/uL (ref 0.7–4.0)
MCH: 31.1 pg (ref 26.0–34.0)
MCHC: 32.7 g/dL (ref 30.0–36.0)
MCV: 94.9 fL (ref 80.0–100.0)
Monocytes Absolute: 0.6 10*3/uL (ref 0.1–1.0)
Monocytes Relative: 13 %
Neutro Abs: 2.7 10*3/uL (ref 1.7–7.7)
Neutrophils Relative %: 56 %
Platelets: 284 10*3/uL (ref 150–400)
RBC: 3.96 MIL/uL (ref 3.87–5.11)
RDW: 14.7 % (ref 11.5–15.5)
WBC: 4.8 10*3/uL (ref 4.0–10.5)
nRBC: 0 % (ref 0.0–0.2)

## 2021-10-11 LAB — COMPREHENSIVE METABOLIC PANEL
ALT: 10 U/L (ref 0–44)
AST: 14 U/L — ABNORMAL LOW (ref 15–41)
Albumin: 3.6 g/dL (ref 3.5–5.0)
Alkaline Phosphatase: 106 U/L (ref 38–126)
Anion gap: 10 (ref 5–15)
BUN: 8 mg/dL (ref 8–23)
CO2: 24 mmol/L (ref 22–32)
Calcium: 8.5 mg/dL — ABNORMAL LOW (ref 8.9–10.3)
Chloride: 109 mmol/L (ref 98–111)
Creatinine, Ser: 0.73 mg/dL (ref 0.44–1.00)
GFR, Estimated: >60 mL/min (ref >60)
Glucose, Bld: 179 mg/dL — ABNORMAL HIGH (ref 70–99)
Potassium: 3.5 mmol/L (ref 3.5–5.1)
Sodium: 143 mmol/L (ref 135–145)
Total Bilirubin: 0.3 mg/dL (ref 0.3–1.2)
Total Protein: 6.5 g/dL (ref 6.5–8.1)

## 2021-10-11 MED ORDER — TAMOXIFEN CITRATE 20 MG PO TABS: 20.0000 mg | ORAL_TABLET | Freq: Every day | ORAL | 4 refills | Status: AC

## 2021-10-11 MED ORDER — SODIUM CHLORIDE 0.9 % IV SOLN
10.0000 mg | Freq: Once | INTRAVENOUS | Status: AC
  Administered 2021-10-11: 10 mg via INTRAVENOUS
  Filled 2021-10-11: qty 10

## 2021-10-11 MED ORDER — SODIUM CHLORIDE 0.9 % IV SOLN
150.0000 mg | Freq: Once | INTRAVENOUS | Status: AC
  Administered 2021-10-11: 150 mg via INTRAVENOUS
  Filled 2021-10-11: qty 150

## 2021-10-11 MED ORDER — HEPARIN SOD (PORK) LOCK FLUSH 100 UNIT/ML IV SOLN
500.0000 [IU] | Freq: Once | INTRAVENOUS | Status: AC | PRN
  Administered 2021-10-11: 500 [IU]

## 2021-10-11 MED ORDER — FLUOROURACIL CHEMO INJECTION 2.5 GM/50ML
600.0000 mg/m2 | Freq: Once | INTRAVENOUS | Status: AC
  Administered 2021-10-11: 950 mg via INTRAVENOUS
  Filled 2021-10-11: qty 19

## 2021-10-11 MED ORDER — SODIUM CHLORIDE 0.9 % IV SOLN
600.0000 mg/m2 | Freq: Once | INTRAVENOUS | Status: AC
  Administered 2021-10-11: 980 mg via INTRAVENOUS
  Filled 2021-10-11: qty 49

## 2021-10-11 MED ORDER — SODIUM CHLORIDE 0.9% FLUSH
10.0000 mL | Freq: Once | INTRAVENOUS | Status: AC
  Administered 2021-10-11: 10 mL

## 2021-10-11 MED ORDER — SODIUM CHLORIDE 0.9% FLUSH
10.0000 mL | INTRAVENOUS | Status: DC | PRN
  Administered 2021-10-11: 10 mL

## 2021-10-11 MED ORDER — SODIUM CHLORIDE 0.9 % IV SOLN: Freq: Once | INTRAVENOUS | Status: AC

## 2021-10-11 MED ORDER — METHOTREXATE SODIUM (PF) CHEMO INJECTION 250 MG/10ML
40.1000 mg/m2 | Freq: Once | INTRAMUSCULAR | Status: AC
Start: 2021-10-11 — End: 2021-10-11
  Administered 2021-10-11: 65 mg via INTRAVENOUS
  Filled 2021-10-11: qty 2.6

## 2021-10-11 MED ORDER — CYCLOSPORINE 0.05 % OP EMUL: 1.0000 [drp] | Freq: Two times a day (BID) | OPHTHALMIC | 1 refills | Status: DC

## 2021-10-11 MED ORDER — INFLUENZA VAC A&B SA ADJ QUAD 0.5 ML IM PRSY
0.5000 mL | PREFILLED_SYRINGE | Freq: Once | INTRAMUSCULAR | Status: AC
  Administered 2021-10-11: 0.5 mL via INTRAMUSCULAR
  Filled 2021-10-11: qty 0.5

## 2021-10-11 NOTE — Patient Instructions (Signed)
[facility]   Discharge Instructions: Thank you for choosing [facility] to provide your oncology and hematology care.   If you have a lab appointment with the [facility], please go directly to the [facility] and check in at the registration area.   Wear comfortable clothing and clothing appropriate for easy access to any Portacath or PICC line.   We strive to give you quality time with your provider. You may need to reschedule your appointment if you arrive late (15 or more minutes).  Arriving late affects you and other patients whose appointments are after yours.  Also, if you miss three or more appointments without notifying the office, you may be dismissed from the clinic at the provider's discretion.      For prescription refill requests, have your pharmacy contact our office and allow 72 hours for refills to be completed.    Today you received the following chemotherapy and/or immunotherapy agents: cyclophosphamide, methotrexate, and fluorouracil.      To help prevent nausea and vomiting after your treatment, we encourage you to take your nausea medication as directed.  BELOW ARE SYMPTOMS THAT SHOULD BE REPORTED IMMEDIATELY: *FEVER GREATER THAN 100.4 F (38 C) OR HIGHER *CHILLS OR SWEATING *NAUSEA AND VOMITING THAT IS NOT CONTROLLED WITH YOUR NAUSEA MEDICATION *UNUSUAL SHORTNESS OF BREATH *UNUSUAL BRUISING OR BLEEDING *URINARY PROBLEMS (pain or burning when urinating, or frequent urination) *BOWEL PROBLEMS (unusual diarrhea, constipation, pain near the anus) TENDERNESS IN MOUTH AND THROAT WITH OR WITHOUT PRESENCE OF ULCERS (sore throat, sores in mouth, or a toothache) UNUSUAL RASH, SWELLING OR PAIN  UNUSUAL VAGINAL DISCHARGE OR ITCHING   Items with * indicate a potential emergency and should be followed up as soon as possible or go to the Emergency Department if any problems should occur.  Please show the CHEMOTHERAPY ALERT CARD  or IMMUNOTHERAPY ALERT CARD at check-in to the Emergency Department and triage nurse.  Should you have questions after your visit or need to cancel or reschedule your appointment, please contact [facility]  Dept: [phone]  and follow the prompts.  Office hours are 8:00 a.m. to 4:30 p.m. Monday - Friday. Please note that voicemails left after 4:00 p.m. may not be returned until the following business day.  We are closed weekends and major holidays. You have access to a nurse at all times for urgent questions. Please call the main number to the clinic Dept: [phone] and follow the prompts.   For any non-urgent questions, you may also contact your provider using MyChart. We now offer e-Visits for anyone 75 and older to request care online for non-urgent symptoms. For details visit [email or web address].   Also download the MyChart app! Go to the app store, search "MyChart", open the app, select [facility], and log in with your MyChart username and password.  Due to Covid, a mask is required upon entering the hospital/clinic. If you do not have a mask, one will be given to you upon arrival. For doctor visits, patients may have 1 support person aged 20 or older with them. For treatment visits, patients cannot have anyone with them due to current Covid guidelines and our immunocompromised population.

## 2021-10-15 ENCOUNTER — Ambulatory Visit: Payer: Medicare HMO | Admitting: Family Medicine

## 2021-10-19 DIAGNOSIS — D1801 Hemangioma of skin and subcutaneous tissue: Secondary | ICD-10-CM | POA: Diagnosis not present

## 2021-10-19 DIAGNOSIS — L578 Other skin changes due to chronic exposure to nonionizing radiation: Secondary | ICD-10-CM | POA: Diagnosis not present

## 2021-10-19 DIAGNOSIS — L814 Other melanin hyperpigmentation: Secondary | ICD-10-CM | POA: Diagnosis not present

## 2021-10-19 DIAGNOSIS — L570 Actinic keratosis: Secondary | ICD-10-CM | POA: Diagnosis not present

## 2021-10-19 DIAGNOSIS — L821 Other seborrheic keratosis: Secondary | ICD-10-CM | POA: Diagnosis not present

## 2021-10-20 ENCOUNTER — Encounter: Payer: Self-pay | Admitting: Family Medicine

## 2021-10-20 ENCOUNTER — Ambulatory Visit (INDEPENDENT_AMBULATORY_CARE_PROVIDER_SITE_OTHER): Payer: Medicare HMO | Admitting: Family Medicine

## 2021-10-20 VITALS — BP 120/78 | HR 88 | Temp 98.6°F | Ht 62.0 in | Wt 120.0 lb

## 2021-10-20 DIAGNOSIS — R030 Elevated blood-pressure reading, without diagnosis of hypertension: Secondary | ICD-10-CM

## 2021-10-20 DIAGNOSIS — N6320 Unspecified lump in the left breast, unspecified quadrant: Secondary | ICD-10-CM | POA: Diagnosis not present

## 2021-10-20 LAB — HM MAMMOGRAPHY

## 2021-10-20 NOTE — Patient Instructions (Addendum)
Check your blood pressures 2-3 times per week, alternating the time of day you check it. If it is high, considering waiting 1-2 minutes and rechecking. If it gets higher, your anxiety is likely creeping up and we should avoid rechecking.   When you check your BP, make sure you have been doing something calm/relaxing 5 minutes prior to checking. Both feet should be flat on the floor and you should be sitting. Use your left arm and make sure it is in a relaxed position (on a table), and that the cuff is at the approximate level/height of your heart.  Send me some blood pressure reading son MyChart in 3-4 weeks.   Keep the diet clean and stay active.  Let us know if you need anything.

## 2021-10-20 NOTE — Progress Notes (Signed)
CC: Elevated BP  Subjective Gina Hunt is a 75 y.o. female who presents for BP f/u. She does monitor home blood pressures. Blood pressures ranging from 140-170's/80-100's on average. She is compliant with medications. Patient has these side effects of medication: none She is sometimes adhering to a healthy diet overall. Current exercise: none No Cp or SOB.    Past Medical History:  Diagnosis Date   Anxiety    Breast cancer ([place]) 01/2021   left breast IDC   Complication of anesthesia    Family history of breast cancer 12/16/2020   Family history of gastric cancer 12/16/2020   Fibromyalgia    GERD (gastroesophageal reflux disease)    Hyperlipidemia    Hypothyroidism    Osteoarthritis    knees, hands, fingers   Osteoarthritis    PONV (postoperative nausea and vomiting)    Skin cancer     Exam BP 120/78    Pulse 88    Temp 98.6 F (37 C) (Oral)    Ht 5\' 2"  (1.575 m)    Wt 120 lb (54.4 kg)    SpO2 96%    BMI 21.95 kg/m  General:  well developed, well nourished, in no apparent distress Heart: RRR, no bruits, no LE edema Lungs: clear to auscultation, no accessory muscle use Psych: well oriented with normal range of affect and appropriate judgment/insight  Elevated blood pressure reading  BP rechecked. Monitor at home. Send message in 3-4 weeks with some readings. Counseled on diet and exercise. F/u pending above. The patient voiced understanding and agreement to the plan.  [place], DO 10/20/21  2:40 PM

## 2021-10-26 ENCOUNTER — Encounter: Payer: Self-pay | Admitting: Gastroenterology

## 2021-10-26 ENCOUNTER — Ambulatory Visit (INDEPENDENT_AMBULATORY_CARE_PROVIDER_SITE_OTHER): Payer: Medicare HMO | Admitting: Gastroenterology

## 2021-10-26 ENCOUNTER — Other Ambulatory Visit: Payer: Medicare HMO

## 2021-10-26 ENCOUNTER — Telehealth: Payer: Self-pay

## 2021-10-26 ENCOUNTER — Other Ambulatory Visit: Payer: Self-pay

## 2021-10-26 VITALS — BP 140/72 | HR 76 | Ht 62.0 in | Wt 127.0 lb

## 2021-10-26 DIAGNOSIS — R1310 Dysphagia, unspecified: Secondary | ICD-10-CM

## 2021-10-26 DIAGNOSIS — R197 Diarrhea, unspecified: Secondary | ICD-10-CM | POA: Diagnosis not present

## 2021-10-26 DIAGNOSIS — R1084 Generalized abdominal pain: Secondary | ICD-10-CM

## 2021-10-26 DIAGNOSIS — K219 Gastro-esophageal reflux disease without esophagitis: Secondary | ICD-10-CM | POA: Diagnosis not present

## 2021-10-26 DIAGNOSIS — Z86010 Personal history of colonic polyps: Secondary | ICD-10-CM | POA: Diagnosis not present

## 2021-10-26 MED ORDER — DICYCLOMINE HCL 10 MG PO CAPS: 10.0000 mg | ORAL_CAPSULE | Freq: Four times a day (QID) | ORAL | 3 refills | Status: DC | PRN

## 2021-10-26 MED ORDER — RABEPRAZOLE SODIUM 20 MG PO TBEC: 20.0000 mg | DELAYED_RELEASE_TABLET | Freq: Two times a day (BID) | ORAL | 3 refills | Status: DC

## 2021-10-26 NOTE — Patient Instructions (Signed)
If you are age 75 or older, your body mass index should be between 23-30. Your Body mass index is 23.23 kg/m. If this is out of the aforementioned range listed, please consider follow up with your Primary Care Provider.  If you are age 29 or younger, your body mass index should be between 19-25. Your Body mass index is 23.23 kg/m. If this is out of the aformentioned range listed, please consider follow up with your Primary Care Provider.   ________________________________________________________  The [facility] providers would like to encourage you to use [facility] to communicate with providers for non-urgent requests or questions.  Due to long hold times on the telephone, sending your provider a message by [facility] may be a faster and more efficient way to get a response.  Please allow 48 business hours for a response.  Please remember that this is for non-urgent requests.  _______________________________________________________  Please go to the lab on the 2nd floor [address] before you leave the office today.   Stop Protonix  We have sent the following medications to your pharmacy for you to pick up at your convenience: Aciphex Bentyl   Please call on Thursday to see you can schedule this.  Please have CT completed by November 09, 2021   You have been scheduled for a CT scan of the abdomen and pelvis at [facility] [phone],  [address] [address]).   You are scheduled on        at        . You should arrive 15 minutes prior to your appointment time for registration. Please follow the written instructions below on the day of your exam:  WARNING: IF YOU ARE ALLERGIC TO IODINE/X-RAY DYE, PLEASE NOTIFY RADIOLOGY IMMEDIATELY AT [phone]! YOU WILL BE GIVEN A 13 HOUR PREMEDICATION PREP.  1) Do not eat or drink anything after            (4 hours prior to your test) 2) You have been given 2 bottles of oral contrast to drink. The solution may taste  better if refrigerated, but do NOT add ice or any other liquid to this solution. Shake well before drinking.    Drink 1 bottle of contrast @         (2 hours prior to your exam)  Drink 1 bottle of contrast @         (1 hour prior to your exam)  You may take any medications as prescribed with a small amount of water, if necessary. If you take any of the following medications: METFORMIN, GLUCOPHAGE, GLUCOVANCE, AVANDAMET, RIOMET, FORTAMET, [place] MET, JANUMET, GLUMETZA or METAGLIP, you MAY be asked to HOLD this medication 48 hours AFTER the exam.  The purpose of you drinking the oral contrast is to aid in the visualization of your intestinal tract. The contrast solution may cause some diarrhea. Depending on your individual set of symptoms, you may also receive an intravenous injection of x-ray contrast/dye. Plan on being at [facility] for 30 minutes or longer, depending on the type of exam you are having performed.  This test typically takes 30-45 minutes to complete.  If you have any questions regarding your exam or if you need to reschedule, you may call the CT department at [phone] between the hours of 8:00 am and 5:00 pm, Monday-Friday.  ________________________________________________________________________   Upcoming appointment is 11-24-2021 at 1:20pm at the [facility].  It was a pleasure to see  you today!  Vito Cirigliano, D.O.

## 2021-10-26 NOTE — Telephone Encounter (Signed)
Received [place] Approval for Rabeprazole and dicyclomine from [facility] from 10/31/20 - 10/30/21. Pharmacy is informed.

## 2021-10-26 NOTE — Progress Notes (Signed)
Chief Complaint:    GERD, Barrett's esophagus, dysphagia, dysguesia, diarrhea  GI History: 75 year old female with a history of breast cancer s/p left mastectomy and currently undergoing adjuvant chemotherapy (scheduled to finish in December per patient then transition to tamoxifen), hyperlipidemia, hypothyroidism, fibromyalgia, initially referred to the GI clinic in 08/2021 for evaluation of reflux symptoms.  Has a long-standing hx of GERD, but increasing heartburn over the last month. Has a metallic taste as well (which she attributes to chemotherapy).  Reflux symptoms tend to be post prandial, w/o identified trigger foods. Takes omeprazole 40 mg QAM for many years, but more recently started taking Tums 2-3 times/day. Occasional dysphagia to liquids > solids ("it sticks and I have to cough it up"), but not hx of food impactions.  EGD many years ago at [facility] and no report in EMR for review, but she was told she has Barrett's Esophagus.    Brother recently died with Esophageal and Stomach CA.   Endoscopic History: - Colonoscopy (11/2015): Inadequate prep.  7 mm polyp removed from 5 cm into the rectum (path benign).  Recommended repeat in 3 years due to inadequate prep. - Colonoscopy (05/2019): 6 mm cecal polyp (path: Tubular adenoma), transverse/sigmoid diverticulosis, moderately tortuous.  Repeat in 7 years (vs no repeat due to age)  HPI:     Patient is a 75 y.o. female presenting to the Gastroenterology Clinic for follow-up.  Initially seen by me on 08/31/2021 for evaluation of longstanding history of reflux with reported history of Barrett's Esophagus along with dysphagia as outlined above.  After discussion and careful consideration, patient opted for trial of high-dose PPI (Protonix 40 mg  bid) with short interval follow-up rather than invasive procedures due to active chemotherapy.  Today, she states she continues to have intermittent post prandial heartburn despite continued Protonix  high dose.  Tends to be worse after dinnertime.  Still with intermittent dysphagia, mostly with liquids.    Separately, c/o generalized abdominal "soreness" for the last week or so which was in the setting of diarrhea at the conclusion of the chemotherapy.  No fever, chills.  Completed adjuvant chemotherapy on 10/11/2021 with plan to start tamoxifen next week.  CBC Latest Ref Rng & Units 10/11/2021 09/20/2021 08/30/2021  WBC 4.0 - 10.5 K/uL 4.8 5.0 6.2  Hemoglobin 12.0 - 15.0 g/dL 12.3 12.1 11.9(L)  Hematocrit 36.0 - 46.0 % 37.6 36.4 36.7  Platelets 150 - 400 K/uL 284 265 264    CMP Latest Ref Rng & Units 10/11/2021 09/20/2021 08/30/2021  Glucose 70 - 99 mg/dL 179(H) 226(H) 159(H)  BUN 8 - 23 mg/dL 8 7(L) 8  Creatinine 0.44 - 1.00 mg/dL 0.73 0.76 0.68  Sodium 135 - 145 mmol/L 143 141 142  Potassium 3.5 - 5.1 mmol/L 3.5 3.8 3.7  Chloride 98 - 111 mmol/L 109 108 108  CO2 22 - 32 mmol/L 24 24 26   Calcium 8.9 - 10.3 mg/dL 8.5(L) 8.3(L) 8.2(L)  Total Protein 6.5 - 8.1 g/dL 6.5 6.3(L) 6.5  Total Bilirubin 0.3 - 1.2 mg/dL 0.3 0.3 0.4  Alkaline Phos 38 - 126 U/L 106 101 108  AST 15 - 41 U/L 14(L) 15 15  ALT 0 - 44 U/L 10 12 11      Review of systems:     No chest pain, no SOB, no fevers, no urinary sx   Past Medical History:  Diagnosis Date   Anxiety    Breast cancer ([place]) 01/2021   left breast IDC   Complication  of anesthesia    Family history of breast cancer 12/16/2020   Family history of gastric cancer 12/16/2020   Fibromyalgia    GERD (gastroesophageal reflux disease)    Hyperlipidemia    Hypothyroidism    Osteoarthritis    knees, hands, fingers   Osteoarthritis    PONV (postoperative nausea and vomiting)    Skin cancer     Patient's surgical history, family medical history, social history, medications and allergies were all reviewed in Epic    Current Outpatient Medications  Medication Sig Dispense Refill   cycloSPORINE (RESTASIS) 0.05 % ophthalmic emulsion Place 1  drop into both eyes 2 (two) times daily. 0.4 mL 1   DULoxetine (CYMBALTA) 60 MG capsule Take 1 capsule (60 mg total) by mouth 2 (two) times daily. 180 capsule 2   gabapentin (NEURONTIN) 300 MG capsule Take 1 capsule (300 mg total) by mouth 3 (three) times daily. 270 capsule 1   levothyroxine (SYNTHROID) 50 MCG tablet Take 1 tablet (50 mcg total) by mouth daily before breakfast. 90 tablet 2   methocarbamol (ROBAXIN) 500 MG tablet Take 1 tablet (500 mg total) by mouth every 6 (six) hours as needed for muscle spasms. 20 tablet 0   pantoprazole (PROTONIX) 40 MG tablet Take 1 tablet (40 mg total) by mouth 2 (two) times daily. 60 tablet 3   simvastatin (ZOCOR) 40 MG tablet Take 1 tablet (40 mg total) by mouth daily. 90 tablet 2   tamoxifen (NOLVADEX) 20 MG tablet Take 1 tablet (20 mg total) by mouth daily. Start October 31, 2021 90 tablet 4   No current facility-administered medications for this visit.    Physical Exam:     BP 140/72    Pulse 76    Ht 5\' 2"  (1.575 m)    Wt 127 lb (57.6 kg)    SpO2 98%    BMI 23.23 kg/m   GENERAL:  Pleasant female in NAD PSYCH: : Cooperative, normal affect EENT:  conjunctiva pink, mucous membranes moist, neck supple without masses CARDIAC:  RRR, no murmur heard, no peripheral edema PULM: Normal respiratory effort, lungs CTA bilaterally, no wheezing ABDOMEN: Generalized mild TTP in upper and mid abdomen.  No rebound or guarding.  No peritoneal signs.  Nondistended, soft, normal bowel sounds SKIN:  turgor, no lesions seen Musculoskeletal:  Normal muscle tone, normal strength NEURO: Alert and oriented x 3, no focal neurologic deficits   IMPRESSION and PLAN:    1) GERD 2) Dysphagia Again discussed diagnostic and treatment options at length today to include 1) medication change, 2) EGD, 3) Esophagram +/- MBS.  She again would like to proceed with medication trial: - Stop Protonix - Start Aciphex 20 mg bid.  To call in 2-3 weeks to update on response to  therapy - Continue antireflux lifestyle/dietary modifications - If suboptimal response to medication change, plan for EGD for diagnostic and potentially therapeutic intent  3) Diarrhea 4) Generalized abdominal pain 1 week of nonbloody diarrhea and abdominal pain in the setting of recent completion of chemotherapy - GI PCR panel and fecal calprotectin - CT abdomen/pelvis to evaluate for intra/extraluminal pathology - Trial of Bentyl - Pending findings above, can initiate Lomotil, fiber supplement, etc.  5) Breast cancer - Completed adjuvant chemotherapy earlier this month with plan to start tamoxifen next week  6) History of colon polyps - Colonoscopy 2020 with single subcentimeter tubular adenoma.  Per guidelines, can consider repeat colonoscopy 2027, although patient has repeatedly said she does not wish to  pursue ongoing polyp surveillance due to age     RTC in 4-6 weeks or sooner as needed  I spent 30 minutes of time, including in depth chart review, independent review of results as outlined above, communicating results with the patient directly, face-to-face time with the patient, coordinating care, and ordering studies and medications as appropriate, and documentation.   [place] ,DO, FACG 10/26/2021, 1:28 PM

## 2021-11-02 ENCOUNTER — Other Ambulatory Visit: Payer: Self-pay | Admitting: General Surgery

## 2021-11-04 ENCOUNTER — Other Ambulatory Visit: Payer: Medicare HMO

## 2021-11-04 ENCOUNTER — Encounter: Payer: Self-pay | Admitting: Oncology

## 2021-11-04 DIAGNOSIS — R197 Diarrhea, unspecified: Secondary | ICD-10-CM | POA: Diagnosis not present

## 2021-11-04 DIAGNOSIS — R1084 Generalized abdominal pain: Secondary | ICD-10-CM | POA: Diagnosis not present

## 2021-11-04 DIAGNOSIS — R1310 Dysphagia, unspecified: Secondary | ICD-10-CM | POA: Diagnosis not present

## 2021-11-04 DIAGNOSIS — K219 Gastro-esophageal reflux disease without esophagitis: Secondary | ICD-10-CM

## 2021-11-04 DIAGNOSIS — A09 Infectious gastroenteritis and colitis, unspecified: Secondary | ICD-10-CM | POA: Diagnosis not present

## 2021-11-06 LAB — CALPROTECTIN, FECAL: Calprotectin, Fecal: <16 ug/g (ref 0–120)

## 2021-11-08 ENCOUNTER — Ambulatory Visit: Payer: Medicare HMO

## 2021-11-08 LAB — GI PROFILE, STOOL, PCR

## 2021-11-09 ENCOUNTER — Encounter: Payer: Self-pay | Admitting: Family Medicine

## 2021-11-09 ENCOUNTER — Other Ambulatory Visit: Payer: Self-pay | Admitting: Radiology

## 2021-11-09 DIAGNOSIS — N6321 Unspecified lump in the left breast, upper outer quadrant: Secondary | ICD-10-CM | POA: Diagnosis not present

## 2021-11-09 DIAGNOSIS — N6012 Diffuse cystic mastopathy of left breast: Secondary | ICD-10-CM | POA: Diagnosis not present

## 2021-11-10 ENCOUNTER — Telehealth: Payer: Self-pay

## 2021-11-10 NOTE — Telephone Encounter (Signed)
Called the patient and left VM to give [place] call back call to go over lab result.   Hunt, Gina V, DO  Brissia Delisa, [place], [place]  Fecal calprotectin was normal but GI PCR panel demonstrates positive Campylobacter.  Negative for other infections.   Please call to check in on patient.  If ongoing diarrhea, plan for azithromycin 1 g x 1, RF 0 for treatment of Campylobacter with persistent symptoms and immunosuppression.

## 2021-11-10 NOTE — Telephone Encounter (Signed)
Called the patient and left VM again to give [place] call back call to go over lab result.

## 2021-11-10 NOTE — Telephone Encounter (Signed)
Patient returned your call please give her a call back.

## 2021-11-11 ENCOUNTER — Ambulatory Visit (HOSPITAL_BASED_OUTPATIENT_CLINIC_OR_DEPARTMENT_OTHER)
Admission: RE | Admit: 2021-11-11 | Discharge: 2021-11-11 | Disposition: A | Discharge status: Home / Self Care | Payer: Medicare HMO | Source: Ambulatory Visit | Attending: Gastroenterology | Admitting: Gastroenterology

## 2021-11-11 ENCOUNTER — Other Ambulatory Visit: Payer: Self-pay

## 2021-11-11 ENCOUNTER — Telehealth: Payer: Self-pay

## 2021-11-11 ENCOUNTER — Encounter (HOSPITAL_BASED_OUTPATIENT_CLINIC_OR_DEPARTMENT_OTHER): Payer: Self-pay

## 2021-11-11 DIAGNOSIS — R109 Unspecified abdominal pain: Secondary | ICD-10-CM | POA: Diagnosis not present

## 2021-11-11 DIAGNOSIS — R1310 Dysphagia, unspecified: Secondary | ICD-10-CM | POA: Insufficient documentation

## 2021-11-11 DIAGNOSIS — K219 Gastro-esophageal reflux disease without esophagitis: Secondary | ICD-10-CM | POA: Diagnosis not present

## 2021-11-11 DIAGNOSIS — R1084 Generalized abdominal pain: Secondary | ICD-10-CM | POA: Diagnosis not present

## 2021-11-11 DIAGNOSIS — I700 Atherosclerosis of aorta: Secondary | ICD-10-CM | POA: Diagnosis not present

## 2021-11-11 DIAGNOSIS — R197 Diarrhea, unspecified: Secondary | ICD-10-CM | POA: Diagnosis not present

## 2021-11-11 MED ORDER — IOHEXOL 300 MG/ML  SOLN
100.0000 mL | Freq: Once | INTRAMUSCULAR | Status: AC | PRN
  Administered 2021-11-11: 100 mL via INTRAVENOUS

## 2021-11-11 NOTE — Telephone Encounter (Signed)
Received a call report from [facility] in regards to CT scan.   IMPRESSION: 1. Focal wall thickening of the sigmoid colon with no significant adjacent inflammatory change, differential includes sequela of prior acute diverticulitis versus neoplasm. Recommend further evaluation with colonoscopy. 2. Diverticulosis of the sigmoid colon. 3.  Aortic Atherosclerosis (ICD10-I70.0).

## 2021-11-11 NOTE — Telephone Encounter (Signed)
Called the patient and left VM again to give [place] call back call to go over lab result.

## 2021-11-11 NOTE — Telephone Encounter (Signed)
Patient called and stated that she has not had any diarrhea until today for several weeks. She states she believes todays episode is from having to drink contrast for her imaging test. The patient stated she would contact the office back if she needed to be on the abx tx.

## 2021-11-12 NOTE — Telephone Encounter (Signed)
Noted, CT will be reviewed at the time of patient's f/u appt on 11/24/20.

## 2021-11-12 NOTE — Telephone Encounter (Signed)
CT reviewed.  Focal area of thickening in the sigmoid colon without obstruction.  Recent GI PCR panel also with positive Campylobacter, but patient reports diarrhea has since resolved so ABX not initiated.  Patient has appointment scheduled with me on 1/25.  We can review the CT scan results at length at that appointment to include full DDx for the CT scan results, but given recent symptoms and recency of last colonoscopy being 05/2019, would be more suspicious for infection/inflammation as etiology.  We will discuss the role/utility of repeat colonoscopy vs repeat imaging at that appointment.

## 2021-11-15 ENCOUNTER — Encounter: Payer: Self-pay | Admitting: Family Medicine

## 2021-11-15 LAB — HM MAMMOGRAPHY

## 2021-11-18 ENCOUNTER — Encounter: Payer: Self-pay | Admitting: Family Medicine

## 2021-11-19 ENCOUNTER — Telehealth: Payer: Self-pay | Admitting: Family Medicine

## 2021-11-19 NOTE — Telephone Encounter (Signed)
-----   Message from [name], DO sent at 11/19/2021  8:57 AM EST ----- Regarding: Letter I think the best way to phrase it would be "Exposure to agent orange has been associated with development of hypertension. While I cannot say that agent orange exactly caused hypertension by itself, it is reasonably plausible this exposure caused or at least contributed to the development of this chronic illness."

## 2021-11-19 NOTE — Telephone Encounter (Signed)
Letter completed as requested/PCP instructed Was completed out of the patients husbands chart. Domingo Sep #[id number]). informed and will pickup at the front desk.

## 2021-11-23 ENCOUNTER — Other Ambulatory Visit: Payer: Self-pay | Admitting: Family Medicine

## 2021-11-23 DIAGNOSIS — M797 Fibromyalgia: Secondary | ICD-10-CM

## 2021-11-23 MED ORDER — LEVOTHYROXINE SODIUM 50 MCG PO TABS: 50.0000 ug | ORAL_TABLET | Freq: Every day | ORAL | 2 refills | Status: DC

## 2021-11-23 MED ORDER — SIMVASTATIN 40 MG PO TABS: 40.0000 mg | ORAL_TABLET | Freq: Every day | ORAL | 2 refills | Status: DC

## 2021-11-23 MED ORDER — DULOXETINE HCL 60 MG PO CPEP: 60.0000 mg | ORAL_CAPSULE | Freq: Two times a day (BID) | ORAL | 2 refills | Status: DC

## 2021-11-23 MED ORDER — GABAPENTIN 300 MG PO CAPS: 300.0000 mg | ORAL_CAPSULE | Freq: Three times a day (TID) | ORAL | 1 refills | Status: DC

## 2021-11-23 NOTE — Addendum Note (Signed)
Addended by: Sharon Seller B on: 11/23/2021 02:17 PM   Modules accepted: Orders

## 2021-11-24 ENCOUNTER — Ambulatory Visit (INDEPENDENT_AMBULATORY_CARE_PROVIDER_SITE_OTHER): Payer: Medicare HMO | Admitting: Gastroenterology

## 2021-11-24 ENCOUNTER — Other Ambulatory Visit: Payer: Self-pay

## 2021-11-24 ENCOUNTER — Encounter: Payer: Self-pay | Admitting: Gastroenterology

## 2021-11-24 VITALS — BP 124/70 | HR 73 | Ht 62.0 in | Wt 128.0 lb

## 2021-11-24 DIAGNOSIS — R197 Diarrhea, unspecified: Secondary | ICD-10-CM | POA: Diagnosis not present

## 2021-11-24 DIAGNOSIS — K219 Gastro-esophageal reflux disease without esophagitis: Secondary | ICD-10-CM | POA: Diagnosis not present

## 2021-11-24 DIAGNOSIS — K30 Functional dyspepsia: Secondary | ICD-10-CM

## 2021-11-24 DIAGNOSIS — Z86010 Personal history of colonic polyps: Secondary | ICD-10-CM

## 2021-11-24 DIAGNOSIS — Z800 Family history of malignant neoplasm of digestive organs: Secondary | ICD-10-CM

## 2021-11-24 DIAGNOSIS — R933 Abnormal findings on diagnostic imaging of other parts of digestive tract: Secondary | ICD-10-CM | POA: Diagnosis not present

## 2021-11-24 NOTE — Patient Instructions (Addendum)
Stay hydrated.   Warning signs/symptoms: Uncontrollable nausea/vomiting, fevers, worsening symptoms despite treatment, confusion.  Give us around 2 business days to get culture back to you.  Let us know if you need anything. 

## 2021-11-24 NOTE — Progress Notes (Signed)
Chief Complaint:    GERD, discuss recent imaging results  GI History: 76 year old female with a history of breast cancer s/p left mastectomy and currently undergoing adjuvant chemotherapy (scheduled to finish in December per patient then transition to tamoxifen), hyperlipidemia, hypothyroidism, fibromyalgia, initially referred to the GI clinic in 08/2021 for evaluation of reflux symptoms.  Has a long-standing hx of GERD, but increasing heartburn over the last month. Has a metallic taste as well (which she attributes to chemotherapy).  Reflux symptoms tend to be post prandial, w/o identified trigger foods. Takes omeprazole 40 mg QAM for many years, but more recently started taking Tums 2-3 times/day. Occasional dysphagia to liquids > solids ("it sticks and I have to cough it up"), but not hx of food impactions.  EGD many years ago at [facility] and no report in EMR for review, but she was told she has Barrett's Esophagus.    Brother recently died with Esophageal and Stomach CA.    Endoscopic History: - Colonoscopy (11/2015): Inadequate prep.  7 mm polyp removed from 5 cm into the rectum (path benign).  Recommended repeat in 3 years due to inadequate prep. - Colonoscopy (05/2019): 6 mm cecal polyp (path: Tubular adenoma), transverse/sigmoid diverticulosis, moderately tortuous.  Repeat in 7 years (vs no repeat due to age)  HPI:     Patient is a 76 y.o. female presenting to the Gastroenterology Clinic for follow-up.  Last seen by me on 10/26/2021.  Main issue at that time was continued intermittent postprandial heartburn and intermittent liquid dysphagia despite high-dose PPI.  Had just completed adjuvant chemotherapy on 10/11/2021 and has since started on tamoxifen.  We decided to change Protonix to Aciphex and if suboptimal response, plan for EGD.  Also obtained CT abdomen/pelvis for generalized abdominal pain.  - 11/04/2021: GI PCR panel with Campylobacter.  Negative/normal fecal calprotectin.   Diarrhea had resolved, so no ABX needed - 11/11/2021: CT abdomen/pelvis: Focal wall thickening of sigmoid colon without adjacent inflammatory changes, sigmoid diverticulosis.  Today, states GERD sxs improved since changing to Aciphex. Still with post prandial indigestion and regurgitation, occurring 2-3 days/week Independent of food types. No dysphagia. Improves with Tums prn.   Last episode of diarrhea was 3 days ago. No abdominal pain.   She is planning on going on a cruise to the [place] in late February  Review of systems:     No chest pain, no SOB, no fevers, no urinary sx   Past Medical History:  Diagnosis Date   Anxiety    Breast cancer ([place]) 01/2021   left breast IDC   Complication of anesthesia    Family history of breast cancer 12/16/2020   Family history of gastric cancer 12/16/2020   Fibromyalgia    GERD (gastroesophageal reflux disease)    Hyperlipidemia    Hypothyroidism    Osteoarthritis    knees, hands, fingers   Osteoarthritis    PONV (postoperative nausea and vomiting)    Skin cancer     Patient's surgical history, family medical history, social history, medications and allergies were all reviewed in Epic    Current Outpatient Medications  Medication Sig Dispense Refill   cycloSPORINE (RESTASIS) 0.05 % ophthalmic emulsion Place 1 drop into both eyes 2 (two) times daily. 0.4 mL 1   dicyclomine (BENTYL) 10 MG capsule Take 1 capsule (10 mg total) by mouth every 6 (six) hours as needed for spasms. 60 capsule 3   DULoxetine (CYMBALTA) 60 MG capsule Take 1 capsule (60 mg total) by mouth  2 (two) times daily. 180 capsule 2   gabapentin (NEURONTIN) 300 MG capsule Take 1 capsule (300 mg total) by mouth 3 (three) times daily. 270 capsule 1   levothyroxine (SYNTHROID) 50 MCG tablet Take 1 tablet (50 mcg total) by mouth daily before breakfast. 90 tablet 2   methocarbamol (ROBAXIN) 500 MG tablet Take 1 tablet (500 mg total) by mouth every 6 (six) hours as needed for muscle  spasms. 20 tablet 0   RABEprazole (ACIPHEX) 20 MG tablet Take 1 tablet (20 mg total) by mouth 2 (two) times daily. 60 tablet 3   simvastatin (ZOCOR) 40 MG tablet Take 1 tablet (40 mg total) by mouth daily. 90 tablet 2   No current facility-administered medications for this visit.    Physical Exam:     BP 124/70    Pulse 73    Ht 5\' 2"  (1.575 m)    Wt 128 lb (58.1 kg)    BMI 23.41 kg/m   GENERAL:  Pleasant female in NAD PSYCH: : Cooperative, normal affect EENT:  conjunctiva pink, mucous membranes moist, neck supple without masses ABDOMEN:  Nondistended, soft, nontender. Normal bowel sounds SKIN:  turgor, no lesions seen Musculoskeletal:  Normal muscle tone, normal strength NEURO: Alert and oriented x 3, no focal neurologic deficits   IMPRESSION and PLAN:    1) GERD 2) Regurgitation 3) Indigestion - Continue Aciphex as prescribed - Continue antireflux lifestyle/dietary modifications - EGD to evaluate for erosive esophagitis, LES laxity, hiatal hernia along with PUD, gastritis   3) Diarrhea-presumed infectious 4) Colitis on CT No diarrhea or abdominal pain in the last couple of days.  Favor changes on recent CT to be inflammatory/infectious in nature as they correspond to generalized abdominal pain and diarrhea with GI PCR panel positive for Campylobacter.  Additionally, last colonoscopy was 05/2019, making neoplasm less likely.  - Discussed role/utility of repeat colonoscopy in 8 weeks to ensure mucosal/luminal healing and rule out malignant etiology.  She would like to avoid repeat colonoscopy unless absolutely necessary - If diarrhea returns, plan for antibiotics - Repeat CT abdomen/pelvis in 6-8 weeks - Start probiotic to take for the next 4 weeks or so     5) History of colon polyps - Colonoscopy 2020 with single subcentimeter tubular adenoma.  Per guidelines, can consider repeat colonoscopy 2027, although patient has repeatedly said she does not wish to pursue ongoing  polyp surveillance due to age unless absolutely necessary  6) Family history of esophageal cancer - EGD as above  I spent 32 minutes of time, including in depth chart review, independent review of results as outlined above, communicating results with the patient directly, face-to-face time with the patient, coordinating care, and ordering studies and medications as appropriate, and documentation.       Lavena Bullion ,DO, FACG 11/24/2021, 1:36 PM

## 2021-11-25 ENCOUNTER — Other Ambulatory Visit: Payer: Self-pay

## 2021-11-25 ENCOUNTER — Telehealth: Payer: Self-pay | Admitting: *Deleted

## 2021-11-25 MED ORDER — TAMOXIFEN CITRATE 20 MG PO TABS: 20.0000 mg | ORAL_TABLET | Freq: Every day | ORAL | 1 refills | Status: DC

## 2021-11-30 ENCOUNTER — Telehealth: Payer: Self-pay | Admitting: *Deleted

## 2021-12-02 DIAGNOSIS — Z170 Estrogen receptor positive status [ER+]: Secondary | ICD-10-CM | POA: Diagnosis not present

## 2021-12-02 DIAGNOSIS — Z452 Encounter for adjustment and management of vascular access device: Secondary | ICD-10-CM | POA: Diagnosis not present

## 2021-12-02 DIAGNOSIS — C50412 Malignant neoplasm of upper-outer quadrant of left female breast: Secondary | ICD-10-CM | POA: Diagnosis not present

## 2021-12-12 ENCOUNTER — Encounter: Payer: Self-pay | Admitting: Family Medicine

## 2021-12-13 ENCOUNTER — Other Ambulatory Visit: Payer: Medicare HMO

## 2021-12-13 ENCOUNTER — Ambulatory Visit (INDEPENDENT_AMBULATORY_CARE_PROVIDER_SITE_OTHER): Payer: Medicare HMO | Admitting: Family Medicine

## 2021-12-13 ENCOUNTER — Encounter: Payer: Self-pay | Admitting: Family Medicine

## 2021-12-13 ENCOUNTER — Encounter: Payer: Self-pay | Admitting: Gastroenterology

## 2021-12-13 VITALS — BP 140/82 | HR 70 | Temp 98.2°F | Ht 62.0 in | Wt 126.2 lb

## 2021-12-13 DIAGNOSIS — N3000 Acute cystitis without hematuria: Secondary | ICD-10-CM | POA: Diagnosis not present

## 2021-12-13 DIAGNOSIS — T8149XA Infection following a procedure, other surgical site, initial encounter: Secondary | ICD-10-CM

## 2021-12-13 LAB — POC URINALSYSI DIPSTICK (AUTOMATED)
Bilirubin, UA: NEGATIVE
Glucose, UA: NEGATIVE
Ketones, UA: NEGATIVE
Nitrite, UA: NEGATIVE
Protein, UA: NEGATIVE
Spec Grav, UA: 1.025 (ref 1.010–1.025)
Urobilinogen, UA: 0.2 E.U./dL
pH, UA: 5 (ref 5.0–8.0)

## 2021-12-13 MED ORDER — CEPHALEXIN 500 MG PO CAPS: 500.0000 mg | ORAL_CAPSULE | Freq: Three times a day (TID) | ORAL | 0 refills | Status: AC

## 2021-12-13 NOTE — Telephone Encounter (Signed)
Pt scheduled today, she will need orders put in for labs.

## 2021-12-13 NOTE — Progress Notes (Signed)
Chief Complaint  Patient presents with   Urinary Frequency    Gina Hunt is a 76 y.o. female here for possible UTI.  Duration: 2 weeks. Symptoms: Dysuria, urinary frequency, urinary retention, flank pain on bilateral, urinary incontinence, and dc, urgency Denies: hematuria, urinary hesitancy, fever, nausea, and vomiting Hx of recurrent UTI? No  Wound on right upper chest for the past 3 weeks after her port was removed. Painful, red, warm, not improving. No fevers or drainage. Steri strip came off today.   Past Medical History:  Diagnosis Date   Anxiety    Breast cancer ([place]) 01/2021   left breast IDC   Complication of anesthesia    Family history of breast cancer 12/16/2020   Family history of gastric cancer 12/16/2020   Fibromyalgia    GERD (gastroesophageal reflux disease)    Hyperlipidemia    Hypothyroidism    Osteoarthritis    knees, hands, fingers   Osteoarthritis    PONV (postoperative nausea and vomiting)    Skin cancer      BP 140/82    Pulse 70    Temp 98.2 F (36.8 C) (Oral)    Ht 5\' 2"  (1.575 m)    Wt 126 lb 4 oz (57.3 kg)    SpO2 97%    BMI 23.09 kg/m  General: Awake, alert, appears stated age Heart: RRR Lungs: CTAB, normal respiratory effort, no accessory muscle usage Abd: BS+, soft, NT, ND, no masses or organomegaly Skin: healing laceration (from port removal) on R upper chest with surrounding erythema, warmth, ttp, blanching; no fluctuance or drainage noted MSK: No CVA tenderness, neg Lloyd's sign Psych: Age appropriate judgment and insight  Acute cystitis without hematuria - Plan: cephALEXin (KEFLEX) 500 MG capsule, POCT Urinalysis Dipstick (Automated), Urine Culture  Postoperative cellulitis of surgical wound - Plan: cephALEXin (KEFLEX) 500 MG capsule  UA suggestive of infection. Keflex to cover for both.  Stay hydrated. Seek immediate care if pt starts to develop fevers, new/worsening symptoms, uncontrollable N/V. F/u prn. The patient voiced  understanding and agreement to the plan.  [place], DO 12/13/21 4:01 PM

## 2021-12-13 NOTE — Patient Instructions (Signed)
Stay hydrated.   Warning signs/symptoms: Uncontrollable nausea/vomiting, fevers, worsening symptoms despite treatment, confusion.  Give us around 2 business days to get culture back to you.  Let us know if you need anything. 

## 2021-12-15 LAB — URINE CULTURE
MICRO NUMBER:: 13000452
SPECIMEN QUALITY:: ADEQUATE

## 2021-12-17 DIAGNOSIS — C50912 Malignant neoplasm of unspecified site of left female breast: Secondary | ICD-10-CM | POA: Diagnosis not present

## 2021-12-21 ENCOUNTER — Encounter: Payer: Medicare HMO | Admitting: *Deleted

## 2021-12-23 ENCOUNTER — Encounter: Payer: Self-pay | Admitting: *Deleted

## 2021-12-23 ENCOUNTER — Inpatient Hospital Stay: Payer: Medicare HMO

## 2021-12-23 ENCOUNTER — Inpatient Hospital Stay (HOSPITAL_BASED_OUTPATIENT_CLINIC_OR_DEPARTMENT_OTHER): Payer: Medicare HMO | Admitting: *Deleted

## 2021-12-23 ENCOUNTER — Inpatient Hospital Stay: Payer: Medicare HMO | Attending: Oncology | Admitting: Hematology and Oncology

## 2021-12-23 ENCOUNTER — Encounter: Payer: Self-pay | Admitting: Hematology and Oncology

## 2021-12-23 ENCOUNTER — Other Ambulatory Visit: Payer: Self-pay

## 2021-12-23 VITALS — BP 161/83 | HR 73 | Temp 97.9°F | Wt 122.8 lb

## 2021-12-23 DIAGNOSIS — R32 Unspecified urinary incontinence: Secondary | ICD-10-CM | POA: Insufficient documentation

## 2021-12-23 DIAGNOSIS — Z170 Estrogen receptor positive status [ER+]: Secondary | ICD-10-CM | POA: Insufficient documentation

## 2021-12-23 DIAGNOSIS — Z9221 Personal history of antineoplastic chemotherapy: Secondary | ICD-10-CM | POA: Insufficient documentation

## 2021-12-23 DIAGNOSIS — C50412 Malignant neoplasm of upper-outer quadrant of left female breast: Secondary | ICD-10-CM

## 2021-12-23 DIAGNOSIS — H04123 Dry eye syndrome of bilateral lacrimal glands: Secondary | ICD-10-CM | POA: Insufficient documentation

## 2021-12-23 DIAGNOSIS — Z9012 Acquired absence of left breast and nipple: Secondary | ICD-10-CM | POA: Insufficient documentation

## 2021-12-23 LAB — CBC WITH DIFFERENTIAL/PLATELET
Abs Immature Granulocytes: 0.01 10*3/uL (ref 0.00–0.07)
Basophils Absolute: 0 10*3/uL (ref 0.0–0.1)
Basophils Relative: 1 %
Eosinophils Absolute: 0.3 10*3/uL (ref 0.0–0.5)
Eosinophils Relative: 6 %
HCT: 39.7 % (ref 36.0–46.0)
Hemoglobin: 13.4 g/dL (ref 12.0–15.0)
Immature Granulocytes: 0 %
Lymphocytes Relative: 26 %
Lymphs Abs: 1.6 10*3/uL (ref 0.7–4.0)
MCH: 31.5 pg (ref 26.0–34.0)
MCHC: 33.8 g/dL (ref 30.0–36.0)
MCV: 93.4 fL (ref 80.0–100.0)
Monocytes Absolute: 0.6 10*3/uL (ref 0.1–1.0)
Monocytes Relative: 10 %
Neutro Abs: 3.5 10*3/uL (ref 1.7–7.7)
Neutrophils Relative %: 57 %
Platelets: 235 10*3/uL (ref 150–400)
RBC: 4.25 MIL/uL (ref 3.87–5.11)
RDW: 14.5 % (ref 11.5–15.5)
WBC: 6 10*3/uL (ref 4.0–10.5)
nRBC: 0 % (ref 0.0–0.2)

## 2021-12-23 LAB — COMPREHENSIVE METABOLIC PANEL
ALT: 9 U/L (ref 0–44)
AST: 13 U/L — ABNORMAL LOW (ref 15–41)
Albumin: 4 g/dL (ref 3.5–5.0)
Alkaline Phosphatase: 68 U/L (ref 38–126)
Anion gap: 5 (ref 5–15)
BUN: 10 mg/dL (ref 8–23)
CO2: 28 mmol/L (ref 22–32)
Calcium: 8.8 mg/dL — ABNORMAL LOW (ref 8.9–10.3)
Chloride: 107 mmol/L (ref 98–111)
Creatinine, Ser: 0.66 mg/dL (ref 0.44–1.00)
GFR, Estimated: >60 mL/min (ref >60)
Glucose, Bld: 115 mg/dL — ABNORMAL HIGH (ref 70–99)
Potassium: 4.2 mmol/L (ref 3.5–5.1)
Sodium: 140 mmol/L (ref 135–145)
Total Bilirubin: 0.5 mg/dL (ref 0.3–1.2)
Total Protein: 6.8 g/dL (ref 6.5–8.1)

## 2021-12-23 NOTE — Progress Notes (Signed)
[place]  Telephone:(336) [phone] Fax:(336) [phone]     ID: Gina Hunt DOB: [birth year]  MR#: [id number]  EHO#:[id number]  Patient Care Team: Gina Pal, DO as PCP - General (Family Medicine) Gina Bookbinder, MD as Consulting Physician (General Surgery) Gina Rudd, MD as Consulting Physician (Radiation Oncology) Gina Bison, MD as Consulting Physician (Dermatology) Gina Pier, RN as Registered Nurse Gina Pike, MD as Consulting Physician (Hematology and Oncology) Gina Hunt, Gina Massed, NP as Nurse Practitioner (Hematology and Oncology) Gina Pike, MD as Consulting Physician (Hematology and Oncology) Gina Pike, MD OTHER MD:   CHIEF COMPLAINT: Estrogen receptor positive breast cancer (s/p left mastectomy)  CURRENT TREATMENT: Completing adjuvant chemotherapy; to start tamoxifen  INTERVAL HISTORY: Gina Hunt returns today for follow up and treatment of her estrogen receptor positive breast cancer.  She is accompanied by her niece  Gina Hunt began adjuvant CMF on 05/17/2021.  Last day of chemo 10/11/2021 She started antiestrogen therapy, 10/31/2021, tamoxifen.  She is tolerating it well. She continues to have trouble with eye dryness. She has some urinary incontinence, dribbles urine after peeing. She says sometimes urine leaks when she is walking. She says she had a mammogram in Jan , cyst noted, repeat mammogram in March 2023. Rest of the pertinent 10 point ROS reviewed and negative.  REVIEW OF SYSTEMS: A detailed review of systems today was otherwise noncontributory   COVID 19 VACCINATION STATUS: Gina Hunt x2, booster September 2022   HISTORY OF CURRENT ILLNESS: From the original intake note:  Gina Hunt (pronounced "Gina Hunt") had routine screening mammography on 11/24/2020 showing a possible abnormality in the left breast. She underwent left breast ultrasonography at [facility] on 12/02/2020 showing: breast density category A; 2.5  cm mass in left breast at 2 o'clock (measuring 1.7 cm in ultrasound); no abnormal left axillary lymph nodes.  Accordingly on 12/08/2020 she proceeded to biopsy of the left breast area in question. The pathology from this procedure ([id number]) showed: invasive ductal carcinoma, grade 3. Prognostic indicators significant for: estrogen receptor, 95% positive with strong staining intensity and progesterone receptor, 80% positive with moderate staining intensity. Proliferation marker Ki67 at 30%. HER2 equivocal by immunohistochemistry (2+), but negative by fluorescent in situ hybridization with a signals ratio 1.64 and number per cell 2.95.   Cancer Staging  Malignant neoplasm of upper-outer quadrant of left breast in female, estrogen receptor positive ([place]) Staging form: Breast, AJCC 8th Edition - Clinical stage from 12/16/2020: Stage IIA (cT2, cN0, cM0, G3, ER+, PR+, HER2-) - Signed by Gina Cruel, MD on 12/16/2020 Stage prefix: Initial diagnosis   The patient's subsequent history is as detailed below.   PAST MEDICAL HISTORY: Past Medical History:  Diagnosis Date   Anxiety    Breast cancer ([place]) 01/2021   left breast IDC   Complication of anesthesia    Family history of breast cancer 12/16/2020   Family history of gastric cancer 12/16/2020   Fibromyalgia    GERD (gastroesophageal reflux disease)    Hyperlipidemia    Hypothyroidism    Osteoarthritis    knees, hands, fingers   Osteoarthritis    PONV (postoperative nausea and vomiting)    Skin cancer     PAST SURGICAL HISTORY: Past Surgical History:  Procedure Laterality Date   ABDOMINAL HYSTERECTOMY     COLONOSCOPY  11/24/2015   EVACUATION BREAST HEMATOMA Left 04/07/2021   Procedure: EVACUATION HEMATOMA BREAST;  Surgeon: Gina Bookbinder, MD;  Location: [facility];  Service: General;  Laterality: Left;  LIVER SURGERY     PORTA CATH INSERTION Right 04/07/2021   Procedure: PORTA CATH INSERTION;  Surgeon: Gina Bookbinder, MD;  Location: [facility];  Service: General;  Laterality: Right;   SIMPLE MASTECTOMY WITH AXILLARY SENTINEL NODE BIOPSY Left 04/07/2021   Procedure: LEFT SIMPLE MASTECTOMY WITH LEFT AXILLARY SENTINEL NODE BIOPSY;  Surgeon: Gina Bookbinder, MD;  Location: [facility];  Service: General;  Laterality: Left;   TONSILLECTOMY      FAMILY HISTORY: Family History  Problem Relation Age of Onset   Hypertension Mother    Diabetes Mother    Arthritis Mother    Heart disease Mother    Rheum arthritis Father    Diabetes Father    Heart disease Father    Stomach cancer Brother 24   Stomach cancer Brother 74   Esophageal cancer Brother 33   Breast cancer Maternal Aunt 70   Breast cancer Maternal Aunt 72   Breast cancer Maternal Aunt 73   Colon cancer Neg Hx    Colon polyps Neg Hx   Her father and mother both died from MI, father age 46 and mother age 76. Gina Hunt has 6 brothers and 1 sister. She reports breast cancer in 3 maternal aunts, one in their 36's. One of her maternal aunt's daughter (Gina Hunt's cousin) had breast cancer at age 26.The patient also reports stomach cancer in two of her brothers in their 53's, one of whom also had esophageal cancer.    GYNECOLOGIC HISTORY:  No LMP recorded. Patient has had a hysterectomy. Menarche: 76 years old Age at first live birth: 76 years old [place] P 1 LMP age 56 Contraceptive: never used HRT never used  Hysterectomy? yes BSO? yes   SOCIAL HISTORY: (updated 12/2020)  Gina Hunt is currently retired from working at the [employer] in [place] died late February 5284 from complications of Agent Orange exposure. She lives by herself. Daughter Gina Hunt lives in [place], the patient believes, but they have been alienated for many years.    ADVANCED DIRECTIVES: not in place; intends to name her niece, Gina Hunt, as her HCPOA.  The patient was given the appropriate documents to complete and  notarized at her discretion on her 12/16/2020 visit   HEALTH MAINTENANCE: Social History   Tobacco Use   Smoking status: Never   Smokeless tobacco: Never  Vaping Use   Vaping Use: Never used  Substance Use Topics   Alcohol use: Yes    Comment: rarely    Drug use: Never     Colonoscopy: 05/2019 (Dr. Bryan Lemma), recall 2027  PAP: "10 years ago" (~2012)  Bone density: 2020   Allergies  Allergen Reactions   Codeine Other (See Comments)    Hallucination    Morphine And Related Other (See Comments)    Knocks me out per pt    Current Outpatient Medications  Medication Sig Dispense Refill   cycloSPORINE (RESTASIS) 0.05 % ophthalmic emulsion Place 1 drop into both eyes 2 (two) times daily. 0.4 mL 1   dicyclomine (BENTYL) 10 MG capsule Take 1 capsule (10 mg total) by mouth every 6 (six) hours as needed for spasms. 60 capsule 3   DULoxetine (CYMBALTA) 60 MG capsule Take 1 capsule (60 mg total) by mouth 2 (two) times daily. 180 capsule 2   gabapentin (NEURONTIN) 300 MG capsule Take 1 capsule (300 mg total) by mouth 3 (three) times daily. 270 capsule 1   levothyroxine (SYNTHROID) 50 MCG tablet Take  1 tablet (50 mcg total) by mouth daily before breakfast. 90 tablet 2   methocarbamol (ROBAXIN) 500 MG tablet Take 1 tablet (500 mg total) by mouth every 6 (six) hours as needed for muscle spasms. 20 tablet 0   RABEprazole (ACIPHEX) 20 MG tablet Take 1 tablet (20 mg total) by mouth 2 (two) times daily. 60 tablet 3   simvastatin (ZOCOR) 40 MG tablet Take 1 tablet (40 mg total) by mouth daily. 90 tablet 2   tamoxifen (NOLVADEX) 20 MG tablet Take 1 tablet (20 mg total) by mouth daily. 90 tablet 1   No current facility-administered medications for this visit.    OBJECTIVE: White woman who appears stated age  76:   12/23/21 1057  BP: (!) 161/83  Pulse: 73  Temp: 97.9 F (36.6 C)  SpO2: 97%      Body mass index is 22.46 kg/m.   Wt Readings from Last 3 Encounters:  12/23/21 122 lb  12.8 oz (55.7 kg)  12/13/21 126 lb 4 oz (57.3 kg)  11/24/21 128 lb (58.1 kg)     ECOG FS:1 - Symptomatic but completely ambulatory  Physical Exam Constitutional:      Appearance: Normal appearance.  Cardiovascular:     Rate and Rhythm: Normal rate and regular rhythm.  Pulmonary:     Effort: Pulmonary effort is normal.     Breath sounds: Normal breath sounds.  Chest:     Comments: Bilateral breasts inspected.  Left breast status postmastectomy, some palpable area of abnormality which according to the patient was deemed as a cyst on the most recent imaging.  No other palpable changes.  Right breast normal to inspection and palpation.  No regional adenopathy. Abdominal:     General: There is no distension.     Tenderness: There is no abdominal tenderness. There is no guarding.  Musculoskeletal:        General: No swelling. Normal range of motion.     Cervical back: Normal range of motion and neck supple. No rigidity.  Lymphadenopathy:     Cervical: No cervical adenopathy.  Skin:    General: Skin is warm and dry.  Neurological:     Mental Status: She is alert.      LAB RESULTS:  CMP     Component Value Date/Time   NA 143 10/11/2021 0932   K 3.5 10/11/2021 0932   CL 109 10/11/2021 0932   CO2 24 10/11/2021 0932   GLUCOSE 179 (H) 10/11/2021 0932   BUN 8 10/11/2021 0932   CREATININE 0.73 10/11/2021 0932   CREATININE 0.72 04/27/2021 1236   CREATININE 0.58 (L) 12/20/2019 1422   CALCIUM 8.5 (L) 10/11/2021 0932   PROT 6.5 10/11/2021 0932   ALBUMIN 3.6 10/11/2021 0932   AST 14 (L) 10/11/2021 0932   AST 15 04/27/2021 1236   ALT 10 10/11/2021 0932   ALT 11 04/27/2021 1236   ALKPHOS 106 10/11/2021 0932   BILITOT 0.3 10/11/2021 0932   BILITOT 0.3 04/27/2021 1236   GFRNONAA >60 10/11/2021 0932   GFRNONAA >60 04/27/2021 1236    No results found for: TOTALPROTELP, ALBUMINELP, A1GS, A2GS, BETS, BETA2SER, GAMS, MSPIKE, SPEI  Lab Results  Component Value Date   WBC 6.0  12/23/2021   NEUTROABS 3.5 12/23/2021   HGB 13.4 12/23/2021   HCT 39.7 12/23/2021   MCV 93.4 12/23/2021   PLT 235 12/23/2021    No results found for: LABCA2  No components found for: [id number]  No results for input(s): INR in  the last 168 hours.  No results found for: LABCA2  No results found for: NLZ767  No results found for: HAL937  No results found for: TKW409  No results found for: CA2729  No components found for: HGQUANT  No results found for: CEA1 / No results found for: CEA1   No results found for: AFPTUMOR  No results found for: CHROMOGRNA  No results found for: KPAFRELGTCHN, LAMBDASER, KAPLAMBRATIO (kappa/lambda light chains)  No results found for: HGBA, HGBA2QUANT, HGBFQUANT, HGBSQUAN (Hemoglobinopathy evaluation)   No results found for: LDH  No results found for: IRON, TIBC, IRONPCTSAT (Iron and TIBC)  No results found for: FERRITIN  Urinalysis    Component Value Date/Time   BILIRUBINUR negative 12/13/2021 1548   PROTEINUR Negative 12/13/2021 1548   UROBILINOGEN 0.2 12/13/2021 1548   NITRITE negative 12/13/2021 1548   LEUKOCYTESUR Trace (A) 12/13/2021 1548     STUDIES: No results found.   ELIGIBLE FOR AVAILABLE RESEARCH PROTOCOL: no  ASSESSMENT: 76 y.o. [place], [place] woman status post left breast upper outer quadrant biopsy 12/08/2020 for a clinical T2N0, stage IIA invasive ductal carcinoma, grade 3, estrogen and progesterone receptor positive, HER-2 not amplified, with an MIB-1 of 30%  (1) Oncotype obtained from the original biopsy shows a score of 32, predicting a risk of recurrence outside the breast in the next 9 years of 20% if node-negative, 25% if node positive, if antiestrogens are the only systemic treatment, also predicting a greater than 15% benefit from chemotherapy  (2) genetics testing 01/01/2021 through the [facility] CustomNext-Cancer +RNAinsight Panel found no deleterious mutations in APC, ATM, AXIN2, BARD1, BMPR1A, BRCA1, BRCA2,  BRIP1, CDH1, CDK4, CDKN2A, CHEK2, DICER1, EPCAM, GREM1, HOXB13, MEN1, MLH1, MSH2, MSH3, MSH6, MUTYH, NBN, NF1, NF2, NTHL1, PALB2, PMS2, POLD1, POLE, PTEN, RAD51C, RAD51D, RECQL, RET, SDHA, SDHAF2, SDHB, SDHC, SDHD, SMAD4, SMARCA4, STK11, TP53, TSC1, TSC2, and VHL.  RNA data is routinely analyzed for use in variant interpretation for all genes.  (3) anastrozole started neoadjuvantly 12/16/2020, discontinued 03/2021 with multiple side effects  (4) status post left mastectomy and sentinel lymph node sampling 04/07/2021 for a pT2 pN0, stage IIA invasive ductal carcinoma, grade 2, with negative margins  (A) a single left axilla lymph node removed  (B) repeat prognostic panel finds the cancer estrogen receptor positive, progesterone receptor and HER2 negative  (C) palpable lesion in the left breast consistent with evolving 0.5 oil cyst/fat necrosis by ultrasonography at [facility] on 07/20/2021  (5) adjuvant chemotherapy with cyclophosphamide, methotrexate and fluorouracil (CMF) started 05/17/2021, repeated every 21 days times 8, last dose 10/11/2021  (6) started tamoxifen 10/31/2021   PLAN:  Patient is here with her niece.  Since she last saw [place], she has been taking tamoxifen as prescribed.  She has been tolerating it very well. No concerns for recurrence on exam today.  The palpable area of abnormality in the left breast near the mastectomy scar was deemed as a cyst according to the patient.  We will also obtain report from [name] for further evaluation. We have discussed about the continue tamoxifen for a total of 5 years. She should continue mammograms for the right breast on an annual basis.   She did not have a bone density in a while.  Since she is on tamoxifen, we are not recquired to monitor bone density however I have encouraged her to continue some weightbearing exercises. Urinary incontinence, recommended Kegel exercises. With regards to dry eyes, she continues to deal with this issue.  She has  had  Restasis prescribed by Dr. Jana Hakim, this has not helped as much, recommended to follow-up with ophthalmology and she agrees.  Total encounter time 30 minutes.*   *Total Encounter Time as defined by the Centers for Medicare and Medicaid Services includes, in addition to the face-to-face time of a patient visit (documented in the note above) non-face-to-face time: obtaining and reviewing outside history, ordering and reviewing medications, tests or procedures, care coordination (communications with other health care professionals or caregivers) and documentation in the medical record.

## 2021-12-23 NOTE — Progress Notes (Signed)
SCP  reviewed and completed with patient.

## 2022-01-03 ENCOUNTER — Telehealth: Payer: Self-pay | Admitting: *Deleted

## 2022-01-03 NOTE — Telephone Encounter (Signed)
This RN spoke with the pt per her call stating she developed a rash on her arm and legs post exposure to the sun. ?She states rash " almost looks like sun poisoning and it itches " ? ?She states she was on a cruise last week- and was " sitting out on the beach in the sun - and noticed within 30 minutes my arms hurt and then my legs " ? ?Rash then occurred later same day. ? ?She used lotions only with minimal benefit. ? ?She is asking if " was this because of the chemo ?" - pt completed adriamycin/ctx on 10/21/2022. ? ?This RN asked about other people with her and any rashes ( ie exposure to possible bed bugs or such ) with pt the only one in her group that has been affected. ? ?She has not taken any benadryl or other antihistamine which she states she has benadryl in the home. ?Her main side effect is " the itching - especially at night- it is waking me up" ? ?Rash since occurring is improving slowly. ? ?This RN offered the pt a visit for evaluation- she would like to use the benadryl for possible benefit and will call with update. ?

## 2022-01-10 ENCOUNTER — Encounter: Payer: Self-pay | Admitting: Gastroenterology

## 2022-01-10 ENCOUNTER — Other Ambulatory Visit: Payer: Self-pay

## 2022-01-10 ENCOUNTER — Ambulatory Visit (AMBULATORY_SURGERY_CENTER): Payer: Medicare HMO | Admitting: Gastroenterology

## 2022-01-10 VITALS — BP 128/69 | HR 73 | Temp 98.0°F | Resp 19 | Ht 62.0 in | Wt 128.0 lb

## 2022-01-10 DIAGNOSIS — K222 Esophageal obstruction: Secondary | ICD-10-CM

## 2022-01-10 DIAGNOSIS — R1310 Dysphagia, unspecified: Secondary | ICD-10-CM | POA: Diagnosis not present

## 2022-01-10 DIAGNOSIS — K449 Diaphragmatic hernia without obstruction or gangrene: Secondary | ICD-10-CM | POA: Diagnosis not present

## 2022-01-10 DIAGNOSIS — K219 Gastro-esophageal reflux disease without esophagitis: Secondary | ICD-10-CM | POA: Diagnosis not present

## 2022-01-10 DIAGNOSIS — K2970 Gastritis, unspecified, without bleeding: Secondary | ICD-10-CM

## 2022-01-10 DIAGNOSIS — K30 Functional dyspepsia: Secondary | ICD-10-CM | POA: Diagnosis not present

## 2022-01-10 MED ORDER — SODIUM CHLORIDE 0.9 % IV SOLN: 500.0000 mL | INTRAVENOUS | Status: DC

## 2022-01-10 NOTE — Progress Notes (Signed)
To Pacu, VsS. Report to [email or web address] ?

## 2022-01-10 NOTE — Op Note (Signed)
[place] ?Patient Name: Gina Hunt ?Procedure Date: 01/10/2022 8:28 AM ?MRN: [id number] ?Endoscopist: Gerrit Heck , MD ?Age: 76 ?Referring MD:  ?Date of Birth: 06-27-1946 ?Gender: Female ?Account #: [id number] ?Procedure:                Upper GI endoscopy ?Indications:              Dyspepsia, Indigestion, Dysphagia, Esophageal  ?                          reflux, Regurgitation ?Medicines:                Monitored Anesthesia Care ?Procedure:                Pre-Anesthesia Assessment: ?                          - Prior to the procedure, a History and Physical  ?                          was performed, and patient medications and  ?                          allergies were reviewed. The patient's tolerance of  ?                          previous anesthesia was also reviewed. The risks  ?                          and benefits of the procedure and the sedation  ?                          options and risks were discussed with the patient.  ?                          All questions were answered, and informed consent  ?                          was obtained. Prior Anticoagulants: The patient has  ?                          taken no previous anticoagulant or antiplatelet  ?                          agents. ASA Grade Assessment: III - A patient with  ?                          severe systemic disease. After reviewing the risks  ?                          and benefits, the patient was deemed in  ?                          satisfactory condition to undergo the procedure. ?  After obtaining informed consent, the endoscope was  ?                          passed under direct vision. Throughout the  ?                          procedure, the patient's blood pressure, pulse, and  ?                          oxygen saturations were monitored continuously. The  ?                          [id number]-[id number] Olympus #[id number] was introduced through  ?                          the mouth, and advanced to the  second part of  ?                          duodenum. The upper GI endoscopy was accomplished  ?                          without difficulty. The patient tolerated the  ?                          procedure well. ?Scope In: ?Scope Out: ?Findings:                 One benign-appearing, intrinsic mild stenosis was  ?                          found in the lower third of the esophagus. This  ?                          stenosis measured less than one cm (in length). The  ?                          stenosis was traversed. A TTS dilator was passed  ?                          through the scope. Dilation with an 18-19-20 mm  ?                          balloon dilator was performed to 18 mm. The  ?                          dilation site was examined and showed mild mucosal  ?                          disruption and moderate improvement in luminal  ?                          narrowing. Estimated blood loss was minimal. ?  A 5 cm hiatal hernia was present. ?                          Scattered mild inflammation characterized by  ?                          congestion (edema) and erythema was found in the  ?                          gastric fundus, in the gastric body, at the  ?                          incisura and in the gastric antrum. Biopsies were  ?                          taken with a cold forceps for Helicobacter pylori  ?                          testing. Estimated blood loss was minimal. ?                          The examined duodenum was normal. ?Complications:            No immediate complications. ?Estimated Blood Loss:     Estimated blood loss was minimal. ?Impression:               - Benign-appearing esophageal stenosis. Dilated  ?                          with 18 mm TTS balloon with appropriate mucosal  ?                          rent, consistent with successful dilation. ?                          - 5 cm hiatal hernia. ?                          - Non-ulcer gastritis. Biopsied. ?                           - Normal examined duodenum. ?Recommendation:           - Patient has a contact number available for  ?                          emergencies. The signs and symptoms of potential  ?                          delayed complications were discussed with the  ?                          patient. Return to normal activities tomorrow.  ?                          Written discharge instructions were provided to  the  ?                          patient. ?                          - Soft food diet today, then advance as tolerated  ?                          to previous diet tomorrow per post dilation  ?                          protocol. ?                          - Continue present medications. ?                          - Await pathology results. ?                          - Repeat upper endoscopy PRN for retreatment. ?Gerrit Heck, MD ?01/10/2022 9:04:50 AM ?

## 2022-01-10 NOTE — Progress Notes (Signed)
VS per CNW ?

## 2022-01-10 NOTE — Patient Instructions (Signed)
Follow a soft diet the rest of today and then advance to regular diet as tolerated. ?Awaiting pathology results ?Repeat Endoscopy as needed for re-treatment ? ?YOU HAD AN ENDOSCOPIC PROCEDURE TODAY AT [facility]:   Refer to the procedure report that was given to you for any specific questions about what was found during the examination.  If the procedure report does not answer your questions, please call your gastroenterologist to clarify.  If you requested that your care partner not be given the details of your procedure findings, then the procedure report has been included in a sealed envelope for you to review at your convenience later. ? ?YOU SHOULD EXPECT: Some feelings of bloating in the abdomen. Passage of more gas than usual.  Walking can help get rid of the air that was put into your GI tract during the procedure and reduce the bloating. If you had a lower endoscopy (such as a colonoscopy or flexible sigmoidoscopy) you may notice spotting of blood in your stool or on the toilet paper. If you underwent a bowel prep for your procedure, you may not have a normal bowel movement for a few days. ? ?Please Note:  You might notice some irritation and congestion in your nose or some drainage.  This is from the oxygen used during your procedure.  There is no need for concern and it should clear up in a day or so. ? ?SYMPTOMS TO REPORT IMMEDIATELY: ? ?Following upper endoscopy (EGD) ? Vomiting of blood or coffee ground material ? New chest pain or pain under the shoulder blades ? Painful or persistently difficult swallowing ? New shortness of breath ? Fever of 100?F or higher ? Black, tarry-looking stools ? ?For urgent or emergent issues, a gastroenterologist can be reached at any hour by calling [phone]. ?Do not use MyChart messaging for urgent concerns.  ? ? ?DIET:  We do recommend a small meal at first, but then you may proceed to your regular diet.  Drink plenty of fluids but you should  avoid alcoholic beverages for 24 hours. ? ?ACTIVITY:  You should plan to take it easy for the rest of today and you should NOT DRIVE or use heavy machinery until tomorrow (because of the sedation medicines used during the test).   ? ?FOLLOW UP: ?Our staff will call the number listed on your records 48-72 hours following your procedure to check on you and address any questions or concerns that you may have regarding the information given to you following your procedure. If we do not reach you, we will leave a message.  We will attempt to reach you two times.  During this call, we will ask if you have developed any symptoms of COVID 19. If you develop any symptoms (ie: fever, flu-like symptoms, shortness of breath, cough etc.) before then, please call [phone].  If you test positive for Covid 19 in the 2 weeks post procedure, please call and report this information to [place].   ? ?If any biopsies were taken you will be contacted by phone or by letter within the next 1-3 weeks.  Please call us at [phone] if you have not heard about the biopsies in 3 weeks.  ? ? ?SIGNATURES/CONFIDENTIALITY: ?You and/or your care partner have signed paperwork which will be entered into your electronic medical record.  These signatures attest to the fact that that the information above on your After Visit Summary has been reviewed and is understood.  Full responsibility of the  confidentiality of this discharge information lies with you and/or your care-partner.  ?

## 2022-01-10 NOTE — Progress Notes (Signed)
Called to room to assist during endoscopic procedure.  Patient ID and intended procedure confirmed with present staff. Received instructions for my participation in the procedure from the performing physician.  

## 2022-01-10 NOTE — Progress Notes (Signed)
? ?GASTROENTEROLOGY PROCEDURE H&P NOTE  ? ?Primary Care Physician: ?[name], DO ? ? ? ?Reason for Procedure:   GERD, dysphagia, indigestion, regurgitation ? ?Plan:    EGD with possible dilation and biopsy ? ?Patient is appropriate for endoscopic procedure(s) in the ambulatory ([facility]) setting. ? ?The nature of the procedure, as well as the risks, benefits, and alternatives were carefully and thoroughly reviewed with the patient. Ample time for discussion and questions allowed. The patient understood, was satisfied, and agreed to proceed.  ? ? ? ?HPI: ?Gina Hunt is a 76 y.o. female who presents for EGD for evaluation of GERD, indigestion, regurgitation and hx of dysphagia. Fhx n/f brother with Esophageal and Stomach CA.  ? ?Past Medical History:  ?Diagnosis Date  ? Anxiety   ? Breast cancer ([facility]) 01/2021  ? left breast IDC  ? Complication of anesthesia   ? Family history of breast cancer 12/16/2020  ? Family history of gastric cancer 12/16/2020  ? Fibromyalgia   ? GERD (gastroesophageal reflux disease)   ? Hyperlipidemia   ? Hypothyroidism   ? Osteoarthritis   ? knees, hands, fingers  ? Osteoarthritis   ? PONV (postoperative nausea and vomiting)   ? Skin cancer   ? ? ?Past Surgical History:  ?Procedure Laterality Date  ? ABDOMINAL HYSTERECTOMY    ? COLONOSCOPY  11/24/2015  ? EVACUATION BREAST HEMATOMA Left 04/07/2021  ? Procedure: EVACUATION HEMATOMA BREAST;  Surgeon: Rolm Bookbinder, MD;  Location: [place];  Service: General;  Laterality: Left;  ? LIVER SURGERY    ? PORTA CATH INSERTION Right 04/07/2021  ? Procedure: PORTA CATH INSERTION;  Surgeon: Rolm Bookbinder, MD;  Location: [place];  Service: General;  Laterality: Right;  ? SIMPLE MASTECTOMY WITH AXILLARY SENTINEL NODE BIOPSY Left 04/07/2021  ? Procedure: LEFT SIMPLE MASTECTOMY WITH LEFT AXILLARY SENTINEL NODE BIOPSY;  Surgeon: Rolm Bookbinder, MD;  Location: [place];  Service:  General;  Laterality: Left;  ? TONSILLECTOMY    ? ? ?Prior to Admission medications   ?Medication Sig Start Date End Date Taking? Authorizing Provider  ?cycloSPORINE (RESTASIS) 0.05 % ophthalmic emulsion Place 1 drop into both eyes 2 (two) times daily. 10/11/21   Magrinat, [name], MD  ?dicyclomine (BENTYL) 10 MG capsule Take 1 capsule (10 mg total) by mouth every 6 (six) hours as needed for spasms. 10/26/21   Samreet Edenfield, Dominic Pea, DO  ?DULoxetine (CYMBALTA) 60 MG capsule Take 1 capsule (60 mg total) by mouth 2 (two) times daily. 11/23/21   [name], DO  ?gabapentin (NEURONTIN) 300 MG capsule Take 1 capsule (300 mg total) by mouth 3 (three) times daily. 11/23/21   [name], DO  ?levothyroxine (SYNTHROID) 50 MCG tablet Take 1 tablet (50 mcg total) by mouth daily before breakfast. 11/23/21   Wendling, Crosby Oyster, DO  ?methocarbamol (ROBAXIN) 500 MG tablet Take 1 tablet (500 mg total) by mouth every 6 (six) hours as needed for muscle spasms. 04/08/21   Rolm Bookbinder, MD  ?RABEprazole (ACIPHEX) 20 MG tablet Take 1 tablet (20 mg total) by mouth 2 (two) times daily. 10/26/21   Geronimo Diliberto V, DO  ?simvastatin (ZOCOR) 40 MG tablet Take 1 tablet (40 mg total) by mouth daily. 11/23/21   [name], DO  ?tamoxifen (NOLVADEX) 20 MG tablet Take 1 tablet (20 mg total) by mouth daily. 11/25/21   Benay Pike, MD  ? ? ?Current Outpatient Medications  ?Medication Sig Dispense Refill  ?  cycloSPORINE (RESTASIS) 0.05 % ophthalmic emulsion Place 1 drop into both eyes 2 (two) times daily. 0.4 mL 1  ? dicyclomine (BENTYL) 10 MG capsule Take 1 capsule (10 mg total) by mouth every 6 (six) hours as needed for spasms. 60 capsule 3  ? DULoxetine (CYMBALTA) 60 MG capsule Take 1 capsule (60 mg total) by mouth 2 (two) times daily. 180 capsule 2  ? gabapentin (NEURONTIN) 300 MG capsule Take 1 capsule (300 mg total) by mouth 3 (three) times daily. 270 capsule 1  ? levothyroxine (SYNTHROID) 50 MCG  tablet Take 1 tablet (50 mcg total) by mouth daily before breakfast. 90 tablet 2  ? methocarbamol (ROBAXIN) 500 MG tablet Take 1 tablet (500 mg total) by mouth every 6 (six) hours as needed for muscle spasms. 20 tablet 0  ? RABEprazole (ACIPHEX) 20 MG tablet Take 1 tablet (20 mg total) by mouth 2 (two) times daily. 60 tablet 3  ? simvastatin (ZOCOR) 40 MG tablet Take 1 tablet (40 mg total) by mouth daily. 90 tablet 2  ? tamoxifen (NOLVADEX) 20 MG tablet Take 1 tablet (20 mg total) by mouth daily. 90 tablet 1  ? ?Current Facility-Administered Medications  ?Medication Dose Route Frequency Provider Last Rate Last Admin  ? 0.9 %  sodium chloride infusion  500 mL Intravenous Continuous Clebert Wenger V, DO      ? ? ?Allergies as of 01/10/2022 - Review Complete 01/10/2022  ?Allergen Reaction Noted  ? Codeine Other (See Comments) 09/07/2016  ? Morphine and related Other (See Comments) 09/07/2016  ? ? ?Family History  ?Problem Relation Age of Onset  ? Hypertension Mother   ? Diabetes Mother   ? Arthritis Mother   ? Heart disease Mother   ? Rheum arthritis Father   ? Diabetes Father   ? Heart disease Father   ? Stomach cancer Brother 61  ? Stomach cancer Brother 65  ? Esophageal cancer Brother 88  ? Breast cancer Maternal Aunt 45  ? Breast cancer Maternal Aunt 72  ? Breast cancer Maternal Aunt 88  ? Colon cancer Neg Hx   ? Colon polyps Neg Hx   ? ? ?Social History  ? ?Socioeconomic History  ? Marital status: Widowed  ?  Spouse name: Not on file  ? Number of children: Not on file  ? Years of education: Not on file  ? Highest education level: Not on file  ?Occupational History  ? Not on file  ?Tobacco Use  ? Smoking status: Never  ? Smokeless tobacco: Never  ?Vaping Use  ? Vaping Use: Never used  ?Substance and Sexual Activity  ? Alcohol use: Yes  ?  Comment: rarely   ? Drug use: Never  ? Sexual activity: Not Currently  ?  Birth control/protection: Surgical  ?Other Topics Concern  ? Not on file  ?Social History Narrative  ?  Not on file  ? ?Social Determinants of Health  ? ?Financial Resource Strain: Not on file  ?Food Insecurity: Not on file  ?Transportation Needs: Not on file  ?Physical Activity: Not on file  ?Stress: Not on file  ?Social Connections: Not on file  ?Intimate Partner Violence: Not on file  ? ? ?Physical Exam: ?Vital signs in last 24 hours: ?'@BP'$  (!) 167/94   Pulse 74   Temp 98 ?F (36.7 ?C)   Ht '5\' 2"'$  (1.575 m)   Wt 128 lb (58.1 kg)   SpO2 98%   BMI 23.41 kg/m?  ?GEN: NAD ?EYE: Sclerae anicteric ?ENT:  MMM ?CV: Non-tachycardic ?Pulm: CTA b/l ?GI: Soft, NT/ND ?NEURO:  Alert & Oriented x 3 ? ? ?Gerrit Heck, DO ?[facility] ? ? ?01/10/2022 8:29 AM ? ?

## 2022-01-12 ENCOUNTER — Telehealth: Payer: Self-pay | Admitting: *Deleted

## 2022-01-12 ENCOUNTER — Telehealth: Payer: Self-pay

## 2022-01-12 NOTE — Telephone Encounter (Signed)
No answer, unable to leave a message, B.Avarae Zwart RN. 

## 2022-01-12 NOTE — Telephone Encounter (Signed)
?  Follow up Call- ? ?Call back number 01/10/2022 05/08/2019  ?Post procedure Call Back phone  # [phone]  ?Permission to leave phone message Yes Yes  ?Some recent data might be hidden  ? No answer, unable to leave message ?

## 2022-01-13 ENCOUNTER — Ambulatory Visit (HOSPITAL_BASED_OUTPATIENT_CLINIC_OR_DEPARTMENT_OTHER)
Admission: RE | Admit: 2022-01-13 | Discharge: 2022-01-13 | Disposition: A | Discharge status: Home / Self Care | Payer: Medicare HMO | Source: Ambulatory Visit | Attending: Gastroenterology | Admitting: Gastroenterology

## 2022-01-13 ENCOUNTER — Other Ambulatory Visit: Payer: Self-pay

## 2022-01-13 ENCOUNTER — Encounter (HOSPITAL_BASED_OUTPATIENT_CLINIC_OR_DEPARTMENT_OTHER): Payer: Self-pay

## 2022-01-13 DIAGNOSIS — R935 Abnormal findings on diagnostic imaging of other abdominal regions, including retroperitoneum: Secondary | ICD-10-CM | POA: Diagnosis not present

## 2022-01-13 DIAGNOSIS — K219 Gastro-esophageal reflux disease without esophagitis: Secondary | ICD-10-CM | POA: Diagnosis not present

## 2022-01-13 DIAGNOSIS — R197 Diarrhea, unspecified: Secondary | ICD-10-CM

## 2022-01-13 DIAGNOSIS — K7689 Other specified diseases of liver: Secondary | ICD-10-CM | POA: Diagnosis not present

## 2022-01-13 DIAGNOSIS — K30 Functional dyspepsia: Secondary | ICD-10-CM

## 2022-01-13 MED ORDER — IOHEXOL 300 MG/ML  SOLN
100.0000 mL | Freq: Once | INTRAMUSCULAR | Status: AC | PRN
  Administered 2022-01-13: 100 mL via INTRAVENOUS

## 2022-01-14 ENCOUNTER — Ambulatory Visit (HOSPITAL_BASED_OUTPATIENT_CLINIC_OR_DEPARTMENT_OTHER): Payer: Medicare HMO

## 2022-01-14 ENCOUNTER — Telehealth: Payer: Self-pay

## 2022-01-14 NOTE — Telephone Encounter (Signed)
Gina Banister, MD  McKew, Real Cons, LPN  ?Advise miralax once daily.  OK to wait for Dr. Doyne Keel return to decide futher action but she'll possibly need a colonoscopy or flex sig.  ? ?Thanks   ? ?

## 2022-01-14 NOTE — Telephone Encounter (Signed)
Left message for pt to call back  °

## 2022-01-14 NOTE — Telephone Encounter (Signed)
?  Doc of the Day ? ?Called report received. ? ?Radiology called to make sure the ordering provider sees the CT from 01/13/22 is seen.  ?IMPRESSION: ?1. Large stool burden in the LEFT colon proximal to a region of ?luminal narrowing. Recommend colonoscopy to exclude a obstructing ?neoplastic lesion in the sigmoid colon. Findings similar to ?comparison exam 11/11/2021 therefore could represent benign ?stricturing. ?Ordering provider is out of the office today. ?

## 2022-01-17 NOTE — Telephone Encounter (Signed)
Patient returned your call.  Please call back

## 2022-01-18 NOTE — Telephone Encounter (Signed)
Left message for pt to call back  °

## 2022-01-19 NOTE — Telephone Encounter (Signed)
See CT abd and pelvis result note from 01/13/22 ?

## 2022-01-20 ENCOUNTER — Encounter: Payer: Self-pay | Admitting: Gastroenterology

## 2022-01-27 ENCOUNTER — Encounter: Payer: Self-pay | Admitting: Oncology

## 2022-01-28 ENCOUNTER — Ambulatory Visit (INDEPENDENT_AMBULATORY_CARE_PROVIDER_SITE_OTHER): Payer: Medicare HMO

## 2022-01-28 VITALS — Ht 61.5 in | Wt 119.0 lb

## 2022-01-28 DIAGNOSIS — Z0000 Encounter for general adult medical examination without abnormal findings: Secondary | ICD-10-CM

## 2022-01-28 NOTE — Progress Notes (Signed)
?I connected with Gina Hunt today by telephone and verified that I am speaking with the correct person using two identifiers. ?Location patient: home ?Location provider: work ?Persons participating in the virtual visit: Gina Hunt, [name]. ?  ?I discussed the limitations, risks, security and privacy concerns of performing an evaluation and management service by telephone and the availability of in person appointments. I also discussed with the patient that there may be a patient responsible charge related to this service. The patient expressed understanding and verbally consented to this telephonic visit.  ?  ?Interactive audio and video telecommunications were attempted between this provider and patient, however failed, due to patient having technical difficulties OR patient did not have access to video capability.  We continued and completed visit with audio only. ? ?  ? ?Vital signs may be patient reported or missing. ? ?Subjective:  ? Gina Hunt is a 76 y.o. female who presents for Medicare Annual (Subsequent) preventive examination. ? ?Review of Systems    ? ?Cardiac Risk Factors include: advanced age (>50mn, >>34women);dyslipidemia ? ?   ?Objective:  ?  ?Today's Vitals  ? 01/28/22 0811  ?Weight: 119 lb (54 kg)  ?Height: 5' 1.5" (1.562 m)  ? ?Body mass index is 22.12 kg/m?. ? ? ?  01/28/2022  ?  8:17 AM 07/19/2021  ? 10:08 AM 04/07/2021  ?  7:25 AM 03/31/2021  ?  2:17 PM 12/16/2020  ? 11:46 AM 10/12/2018  ?  1:24 PM  ?Advanced Directives  ?Does Patient Have a Medical Advance Directive? Yes Yes Yes Yes Yes Yes  ?Type of AParamed[place]of [place] will [place] will [place] Park[place] will Living will Healthcare Power of [place] will  ?Does patient want to make changes to medical advance directive?  No - Patient declined  No - Patient declined No - Patient declined No - Patient declined   ?Copy of [place] Chart? No - copy requested No - copy requested No - copy requested  No - copy requested No - copy requested  ? ? ?Current Medications (verified) ?Outpatient Encounter Medications as of 01/28/2022  ?Medication Sig  ? cycloSPORINE (RESTASIS) 0.05 % ophthalmic emulsion Place 1 drop into both eyes 2 (two) times daily.  ? dicyclomine (BENTYL) 10 MG capsule Take 1 capsule (10 mg total) by mouth every 6 (six) hours as needed for spasms.  ? DULoxetine (CYMBALTA) 60 MG capsule Take 1 capsule (60 mg total) by mouth 2 (two) times daily.  ? gabapentin (NEURONTIN) 300 MG capsule Take 1 capsule (300 mg total) by mouth 3 (three) times daily.  ? levothyroxine (SYNTHROID) 50 MCG tablet Take 1 tablet (50 mcg total) by mouth daily before breakfast.  ? methocarbamol (ROBAXIN) 500 MG tablet Take 1 tablet (500 mg total) by mouth every 6 (six) hours as needed for muscle spasms.  ? RABEprazole (ACIPHEX) 20 MG tablet Take 1 tablet (20 mg total) by mouth 2 (two) times daily.  ? simvastatin (ZOCOR) 40 MG tablet Take 1 tablet (40 mg total) by mouth daily.  ? tamoxifen (NOLVADEX) 20 MG tablet Take 1 tablet (20 mg total) by mouth daily.  ? ?No facility-administered encounter medications on file as of 01/28/2022.  ? ? ?Allergies (verified) ?Codeine and Morphine and related  ? ?History: ?Past Medical History:  ?Diagnosis Date  ? Anxiety   ? [place]  ? left breast IDC  ? Complication of  anesthesia   ? Family history of breast cancer 12/16/2020  ? Family history of gastric cancer 12/16/2020  ? Fibromyalgia   ? GERD (gastroesophageal reflux disease)   ? Hyperlipidemia   ? Hypothyroidism   ? Osteoarthritis   ? knees, hands, fingers  ? Osteoarthritis   ? PONV (postoperative nausea and vomiting)   ? Skin cancer   ? ?Past Surgical History:  ?Procedure Laterality Date  ? ABDOMINAL HYSTERECTOMY    ? COLONOSCOPY  11/24/2015  ? EVACUATION BREAST HEMATOMA Left 04/07/2021  ? Procedure: EVACUATION HEMATOMA  BREAST;  Surgeon: Rolm Bookbinder, MD;  Location: [place];  Service: General;  Laterality: Left;  ? LIVER SURGERY    ? PORTA CATH INSERTION Right 04/07/2021  ? Procedure: PORTA CATH INSERTION;  Surgeon: Rolm Bookbinder, MD;  Location: [place];  Service: General;  Laterality: Right;  ? SIMPLE MASTECTOMY WITH AXILLARY SENTINEL NODE BIOPSY Left 04/07/2021  ? Procedure: LEFT SIMPLE MASTECTOMY WITH LEFT AXILLARY SENTINEL NODE BIOPSY;  Surgeon: Rolm Bookbinder, MD;  Location: [place];  Service: General;  Laterality: Left;  ? TONSILLECTOMY    ? ?Family History  ?Problem Relation Age of Onset  ? Hypertension Mother   ? Diabetes Mother   ? Arthritis Mother   ? Heart disease Mother   ? Rheum arthritis Father   ? Diabetes Father   ? Heart disease Father   ? Stomach cancer Brother 67  ? Stomach cancer Brother 1  ? Esophageal cancer Brother 62  ? Breast cancer Maternal Aunt 63  ? Breast cancer Maternal Aunt 72  ? Breast cancer Maternal Aunt 48  ? Colon cancer Neg Hx   ? Colon polyps Neg Hx   ? ?Social History  ? ?Socioeconomic History  ? Marital status: Widowed  ?  Spouse name: Not on file  ? Number of children: Not on file  ? Years of education: Not on file  ? Highest education level: Not on file  ?Occupational History  ? Not on file  ?Tobacco Use  ? Smoking status: Never  ? Smokeless tobacco: Never  ?Vaping Use  ? Vaping Use: Never used  ?Substance and Sexual Activity  ? Alcohol use: Yes  ?  Comment: rarely   ? Drug use: Never  ? Sexual activity: Not Currently  ?  Birth control/protection: Surgical  ?Other Topics Concern  ? Not on file  ?Social History Narrative  ? Not on file  ? ?Social Determinants of Health  ? ?Financial Resource Strain: Low Risk   ? Difficulty of Paying Living Expenses: Not hard at all  ?Food Insecurity: No Food Insecurity  ? Worried About Charity fundraiser in the Last Year: Never true  ? Ran Out of Food in the Last Year: Never true   ?Transportation Needs: No Transportation Needs  ? Lack of Transportation (Medical): No  ? Lack of Transportation (Non-Medical): No  ?Physical Activity: Sufficiently Active  ? Days of Exercise per Week: 6 days  ? Minutes of Exercise per Session: 60 min  ?Stress: Stress Concern Present  ? Feeling of Stress : To some extent  ?Social Connections: Not on file  ? ? ?Tobacco Counseling ?Counseling given: Not Answered ? ? ?Clinical Intake: ? ?Pre-visit preparation completed: Yes ? ?Pain : No/denies pain ? ?  ? ?Nutritional Status: BMI of 19-24  Normal ?Nutritional Risks: None ?Diabetes: No ? ?How often do you need to have someone help you when you read instructions, pamphlets, or other  written materials from your doctor or pharmacy?: 1 - Never ?What is the last grade level you completed in school?: 12th grade ? ?Diabetic? no ? ?Interpreter Needed?: No ? ?Information entered by :: NAllen LPN ? ? ?Activities of Daily Living ? ?  01/28/2022  ?  8:23 AM 06/02/2021  ? 11:21 AM  ?In your present state of health, do you have any difficulty performing the following activities:  ?Hearing? 0 0  ?Vision? 1 0  ?Comment when has dry eyes   ?Difficulty concentrating or making decisions? 0 0  ?Walking or climbing stairs? 0 0  ?Dressing or bathing? 0 0  ?Doing errands, shopping? 0 0  ?Preparing Food and eating ? N   ?Using the Toilet? N   ?In the past six months, have you accidently leaked urine? Y   ?Do you have problems with loss of bowel control? N   ?Managing your Medications? N   ?Managing your Finances? N   ?Housekeeping or managing your Housekeeping? N   ? ? ?Patient Care Team: ?[name], DO as PCP - General (Family Medicine) ?Rolm Bookbinder, MD as Consulting Physician (General Surgery) ?Kyung Rudd, MD as Consulting Physician (Radiation Oncology) ?Sheryn Bison, MD as Consulting Physician (Dermatology) ?Harmon Pier, RN as Registered Nurse ?Benay Pike, MD as Consulting Physician (Hematology and  Oncology) ?Gardenia Phlegm, NP as Nurse Practitioner (Hematology and Oncology) ?Benay Pike, MD as Consulting Physician (Hematology and Oncology) ? ?Indicate any recent Medical Services you may have received fro

## 2022-01-28 NOTE — Patient Instructions (Signed)
Ms. Edmonston , ?Thank you for taking time to come for your Medicare Wellness Visit. I appreciate your ongoing commitment to your health goals. Please review the following plan we discussed and let me know if I can assist you in the future.  ? ?Screening recommendations/referrals: ?Colonoscopy: scheduled for 02/20/2022 ?Mammogram: completed 11/05/2021 ?Bone Density: completed 03/27/2019 ?Recommended yearly ophthalmology/optometry visit for glaucoma screening and checkup ?Recommended yearly dental visit for hygiene and checkup ? ?Vaccinations: ?Influenza vaccine: due next flu season ?Pneumococcal vaccine: due ?Tdap vaccine: due ?Shingles vaccine: discussed   ?Covid-19: 07/06/2021, 06/16/2020, 05/26/2020 ? ?Advanced directives: Please bring a copy of your POA (Power of Attorney) and/or Living Will to your next appointment.  ? ?Conditions/risks identified: none ? ?Next appointment: Follow up in one year for your annual wellness visit  ? ? ?Preventive Care 47 Years and Older, Female ?Preventive care refers to lifestyle choices and visits with your health care provider that can promote health and wellness. ?What does preventive care include? ?A yearly physical exam. This is also called an annual well check. ?Dental exams once or twice a year. ?Routine eye exams. Ask your health care provider how often you should have your eyes checked. ?Personal lifestyle choices, including: ?Daily care of your teeth and gums. ?Regular physical activity. ?Eating a healthy diet. ?Avoiding tobacco and drug use. ?Limiting alcohol use. ?Practicing safe sex. ?Taking low-dose aspirin every day. ?Taking vitamin and mineral supplements as recommended by your health care provider. ?What happens during an annual well check? ?The services and screenings done by your health care provider during your annual well check will depend on your age, overall health, lifestyle risk factors, and family history of disease. ?Counseling  ?Your health care provider may ask  you questions about your: ?Alcohol use. ?Tobacco use. ?Drug use. ?Emotional well-being. ?Home and relationship well-being. ?Sexual activity. ?Eating habits. ?History of falls. ?Memory and ability to understand (cognition). ?Work and work Statistician. ?Reproductive health. ?Screening  ?You may have the following tests or measurements: ?Height, weight, and BMI. ?Blood pressure. ?Lipid and cholesterol levels. These may be checked every 5 years, or more frequently if you are over 44 years old. ?Skin check. ?Lung cancer screening. You may have this screening every year starting at age 72 if you have a 30-pack-year history of smoking and currently smoke or have quit within the past 15 years. ?Fecal occult blood test (FOBT) of the stool. You may have this test every year starting at age 54. ?Flexible sigmoidoscopy or colonoscopy. You may have a sigmoidoscopy every 5 years or a colonoscopy every 10 years starting at age 32. ?Hepatitis C blood test. ?Hepatitis B blood test. ?Sexually transmitted disease (STD) testing. ?Diabetes screening. This is done by checking your blood sugar (glucose) after you have not eaten for a while (fasting). You may have this done every 1-3 years. ?Bone density scan. This is done to screen for osteoporosis. You may have this done starting at age 64. ?Mammogram. This may be done every 1-2 years. Talk to your health care provider about how often you should have regular mammograms. ?Talk with your health care provider about your test results, treatment options, and if necessary, the need for more tests. ?Vaccines  ?Your health care provider may recommend certain vaccines, such as: ?Influenza vaccine. This is recommended every year. ?Tetanus, diphtheria, and acellular pertussis (Tdap, Td) vaccine. You may need a Td booster every 10 years. ?Zoster vaccine. You may need this after age 52. ?Pneumococcal 13-valent conjugate (PCV13) vaccine. One dose is  recommended after age 36. ?Pneumococcal  polysaccharide (PPSV23) vaccine. One dose is recommended after age 69. ?Talk to your health care provider about which screenings and vaccines you need and how often you need them. ?This information is not intended to replace advice given to you by your health care provider. Make sure you discuss any questions you have with your health care provider. ?Document Released: 11/13/2015 Document Revised: 07/06/2016 Document Reviewed: 08/18/2015 ?Elsevier Interactive Patient Education ? 2017 [employer]. ? ?Fall Prevention in the Home ?Falls can cause injuries. They can happen to people of all ages. There are many things you can do to make your home safe and to help prevent falls. ?What can I do on the outside of my home? ?Regularly fix the edges of walkways and driveways and fix any cracks. ?Remove anything that might make you trip as you walk through a door, such as a raised step or threshold. ?Trim any bushes or trees on the path to your home. ?Use bright outdoor lighting. ?Clear any walking paths of anything that might make someone trip, such as rocks or tools. ?Regularly check to see if handrails are loose or broken. Make sure that both sides of any steps have handrails. ?Any raised decks and porches should have guardrails on the edges. ?Have any leaves, snow, or ice cleared regularly. ?Use sand or salt on walking paths during winter. ?Clean up any spills in your garage right away. This includes oil or grease spills. ?What can I do in the bathroom? ?Use night lights. ?Install grab bars by the toilet and in the tub and shower. Do not use towel bars as grab bars. ?Use non-skid mats or decals in the tub or shower. ?If you need to sit down in the shower, use a plastic, non-slip stool. ?Keep the floor dry. Clean up any water that spills on the floor as soon as it happens. ?Remove soap buildup in the tub or shower regularly. ?Attach bath mats securely with double-sided non-slip rug tape. ?Do not have throw rugs and other  things on the floor that can make you trip. ?What can I do in the bedroom? ?Use night lights. ?Make sure that you have a light by your bed that is easy to reach. ?Do not use any sheets or blankets that are too big for your bed. They should not hang down onto the floor. ?Have a firm chair that has side arms. You can use this for support while you get dressed. ?Do not have throw rugs and other things on the floor that can make you trip. ?What can I do in the kitchen? ?Clean up any spills right away. ?Avoid walking on wet floors. ?Keep items that you use a lot in easy-to-reach places. ?If you need to reach something above you, use a strong step stool that has a grab bar. ?Keep electrical cords out of the way. ?Do not use floor polish or wax that makes floors slippery. If you must use wax, use non-skid floor wax. ?Do not have throw rugs and other things on the floor that can make you trip. ?What can I do with my stairs? ?Do not leave any items on the stairs. ?Make sure that there are handrails on both sides of the stairs and use them. Fix handrails that are broken or loose. Make sure that handrails are as long as the stairways. ?Check any carpeting to make sure that it is firmly attached to the stairs. Fix any carpet that is loose or worn. ?Avoid having  throw rugs at the top or bottom of the stairs. If you do have throw rugs, attach them to the floor with carpet tape. ?Make sure that you have a light switch at the top of the stairs and the bottom of the stairs. If you do not have them, ask someone to add them for you. ?What else can I do to help prevent falls? ?Wear shoes that: ?Do not have high heels. ?Have rubber bottoms. ?Are comfortable and fit you well. ?Are closed at the toe. Do not wear sandals. ?If you use a stepladder: ?Make sure that it is fully opened. Do not climb a closed stepladder. ?Make sure that both sides of the stepladder are locked into place. ?Ask someone to hold it for you, if possible. ?Clearly  mark and make sure that you can see: ?Any grab bars or handrails. ?First and last steps. ?Where the edge of each step is. ?Use tools that help you move around (mobility aids) if they are needed. These include: ?Canes

## 2022-02-10 DIAGNOSIS — M65331 Trigger finger, right middle finger: Secondary | ICD-10-CM | POA: Diagnosis not present

## 2022-02-10 DIAGNOSIS — M79641 Pain in right hand: Secondary | ICD-10-CM | POA: Diagnosis not present

## 2022-02-10 DIAGNOSIS — M65321 Trigger finger, right index finger: Secondary | ICD-10-CM | POA: Diagnosis not present

## 2022-02-14 ENCOUNTER — Encounter: Payer: Self-pay | Admitting: Gastroenterology

## 2022-02-14 ENCOUNTER — Ambulatory Visit: Payer: Medicare HMO | Admitting: Gastroenterology

## 2022-02-14 VITALS — BP 126/84 | HR 58 | Ht 62.0 in | Wt 124.4 lb

## 2022-02-14 DIAGNOSIS — K449 Diaphragmatic hernia without obstruction or gangrene: Secondary | ICD-10-CM | POA: Diagnosis not present

## 2022-02-14 DIAGNOSIS — K219 Gastro-esophageal reflux disease without esophagitis: Secondary | ICD-10-CM

## 2022-02-14 DIAGNOSIS — K5900 Constipation, unspecified: Secondary | ICD-10-CM

## 2022-02-14 DIAGNOSIS — R194 Change in bowel habit: Secondary | ICD-10-CM | POA: Diagnosis not present

## 2022-02-14 DIAGNOSIS — Z86010 Personal history of colonic polyps: Secondary | ICD-10-CM

## 2022-02-14 DIAGNOSIS — R9389 Abnormal findings on diagnostic imaging of other specified body structures: Secondary | ICD-10-CM | POA: Diagnosis not present

## 2022-02-14 MED ORDER — CLENPIQ 10-3.5-12 MG-GM -GM/160ML PO SOLN: 1.0000 | ORAL | 0 refills | Status: DC

## 2022-02-14 NOTE — Patient Instructions (Addendum)
If you are age 76 or older, your body mass index should be between 23-30. Your Body mass index is 22.75 kg/m?Marland Kitchen If this is out of the aforementioned range listed, please consider follow up with your Primary Care Provider. ? ?__________________________________________________________ ? ?The  GI providers would like to encourage you to use [facility] to communicate with providers for non-urgent requests or questions.  Due to long hold times on the telephone, sending your provider a message by [facility] may be a faster and more efficient way to get a response.  Please allow 48 business hours for a response.  Please remember that this is for non-urgent requests.  ?Due to recent changes in healthcare laws, you may see the results of your imaging and laboratory studies on MyChart before your provider has had a chance to review them.  We understand that in some cases there may be results that are confusing or concerning to you. Not all laboratory results come back in the same time frame and the provider may be waiting for multiple results in order to interpret others.  Please give [place] 48 hours in order for your provider to thoroughly review all the results before contacting the office for clarification of your results.  ? ?We have sent the following medications to your pharmacy for you to pick up at your convenience: Clenpiq ? ?Please purchase the following medications over the counter and take as directed: Dulcolax, 238 gram bottle of Miralax ? ?You have been scheduled for a colonoscopy. Please follow written instructions given to you at your visit today.  ?Please pick up your prep supplies at the pharmacy within the next 1-3 days. ?If you use inhalers (even only as needed), please bring them with you on the day of your procedure.  ? ? ?Thank you for choosing me and [facility]. ? ?Gerrit Heck, D.O. ? ? ?We want to thank you for trusting [facility] with your care. All of our staff  and providers value the relationships we have built with our patients, and it is an honor to care for you.  ? ?We are writing to let you know that [facility] [place] will close on Mar 14, 2022, and we invite you to continue to see Dr. Carmell Austria and Gerrit Heck at the [facility] [place] office location. We are consolidating our serices at these [facility] practices to better provide care. Our office staff will work with you to ensure a seamless transition.  ? ?Gerrit Heck, DO -Dr. Bryan Lemma will be movig to [facility] at [address], [place], [place] [address], effective Mar 14, 2022.  Contact [phone] to schedule an appointment with him.  ? ?Carmell Austria, MD- Dr. Lyndel Safe will be movig to [facility] at [address], [place], [place] [address], effective Mar 14, 2022.  Contact [phone] to schedule an appointment with him.  ? ?Requesting Medical Records ?If you need to request your medical records, please follow the instructions below. Your medical records are confidential, and a copy can be transferred to another provider or released to you or another person you designate only with your permission. ? ?There are several ways to request your medical records: ?Requests for medical records can be submitted through our practice.   ?You can also request your records electronically, in your MyChart account by selecting the ?Request Health Records? tab.  ?If you need additional information on how to request records, please go to [email or web address] choose Patient Information, then select  Request Medical Records. ?To make an appointment or if you have any questions about your health care needs, please contact our office at [phone] and one of our staff members will be glad to assist you. ?[place] is committed to providing exceptional care for you and our community. Thank you for allowing [place] to serve your health care  needs. ?Sincerely, ? ?Windy Canny, Director [facility] ?[place] also offers convenient virtual care options. Sore throat? Sinus problems? Cold or flu symptoms? Get care from the comfort of home with [facility] Video Visits and e-Visits. Learn more about the non-emergency conditions treated and start your virtual visit at [email or web address]  ? ?

## 2022-02-14 NOTE — Progress Notes (Signed)
? ?Chief Complaint:    Follow-up of abnormal CT scan, change in bowel habits ? ?GI History: 76 year old female with a history of breast cancer s/p left mastectomy and adjuvant chemotherapy, now tamoxifen, hyperlipidemia, hypothyroidism, fibromyalgia, initially referred to the GI clinic in 08/2021 for evaluation of reflux symptoms. ? ?Has a long-standing hx of GERD, but increasing heartburn over the last month. Has a metallic taste as well (which she attributes to chemotherapy).  Reflux symptoms tend to be post prandial, w/o identified trigger foods. Takes omeprazole 40 mg QAM for many years, but more recently started taking Tums 2-3 times/day. Occasional dysphagia to liquids > solids ("it sticks and I have to cough it up"), but not hx of food impactions. ? ?EGD many years ago at [facility] and no report in EMR for review, but she was told she has Barrett's Esophagus.  ?  ?Brother recently died with Esophageal and Stomach CA.  ?  ?Endoscopic History: ?- Colonoscopy (11/2015): Inadequate prep.  7 mm polyp removed from 5 cm into the rectum (path benign).  Recommended repeat in 3 years due to inadequate prep. ?- Colonoscopy (05/2019): 6 mm cecal polyp (path: Tubular adenoma), transverse/sigmoid diverticulosis, moderately tortuous.  Repeat in 7 years (vs no repeat due to age and patient preference) ?- EGD (12/2021): Mild stricture lower esophagus dilated with 18 mm TTS balloon.  5 cm HH, mild non-H. pylori gastritis ? ?HPI:   ? ? ?Patient is a 76 y.o. female presenting to the Gastroenterology Clinic for follow-up.  Last seen by on 11/24/2021 for continued evaluation of GERD.  Reflux symptoms had improved since changing to Aciphex but still with postprandial indigestion and regurgitation, so she was further evaluated with EGD last month as outlined above.  Separately, diarrhea was improving after recent bout of Campylobacter infection.  Patient decided on conservative management and repeat imaging rather than repeat  colonoscopy. ? ?-11/11/2021: CT abdomen/pelvis: Focal wall thickening of the sigmoid colon without adjacent inflammatory change.  No evidence of obstruction. ?-01/13/2022: CT abdomen/pelvis: Moderate size hiatal hernia, large volume stool in colon, redundant sigmoid/descending colon.  Short 2 cm focus of stricturing in the sigmoid colon.  No stool distal to the narrowing.  2 small left hepatic lobe cysts, unchanged from prior. ? ?Recommended MiraLAX 1 cap/day along with colonoscopy based on the above CT findings.  She presents today to discuss in further detail. ? ?Has been using Miralax 1 cap/day and prn stool softeners for constipation, and finally loose stools yesterday.  ? ? ? ? ?  Latest Ref Rng & Units 12/23/2021  ? 10:42 AM 10/11/2021  ?  9:32 AM 09/20/2021  ?  9:50 AM  ?CBC  ?WBC 4.0 - 10.5 K/uL 6.0   4.8   5.0    ?Hemoglobin 12.0 - 15.0 g/dL 13.4   12.3   12.1    ?Hematocrit 36.0 - 46.0 % 39.7   37.6   36.4    ?Platelets 150 - 400 K/uL 235   284   265    ? ? ?  Latest Ref Rng & Units 12/23/2021  ? 10:42 AM 10/11/2021  ?  9:32 AM 09/20/2021  ?  9:50 AM  ?CMP  ?Glucose 70 - 99 mg/dL 115   179   226    ?BUN 8 - 23 mg/dL '10   8   7    '$ ?Creatinine 0.44 - 1.00 mg/dL 0.66   0.73   0.76    ?Sodium 135 - 145 mmol/L 140  143   141    ?Potassium 3.5 - 5.1 mmol/L 4.2   3.5   3.8    ?Chloride 98 - 111 mmol/L 107   109   108    ?CO2 22 - 32 mmol/L '28   24   24    '$ ?Calcium 8.9 - 10.3 mg/dL 8.8   8.5   8.3    ?Total Protein 6.5 - 8.1 g/dL 6.8   6.5   6.3    ?Total Bilirubin 0.3 - 1.2 mg/dL 0.5   0.3   0.3    ?Alkaline Phos 38 - 126 U/L 68   106   101    ?AST 15 - 41 U/L '13   14   15    '$ ?ALT 0 - 44 U/L '9   10   12    '$ ? ? ? ?Review of systems:     No chest pain, no SOB, no fevers, no urinary sx  ? ?Past Medical History:  ?Diagnosis Date  ? Anxiety   ? Breast cancer ([place]) 01/2021  ? left breast IDC  ? Complication of anesthesia   ? Family history of breast cancer 12/16/2020  ? Family history of gastric cancer 12/16/2020  ?  Fibromyalgia   ? GERD (gastroesophageal reflux disease)   ? Hyperlipidemia   ? Hypothyroidism   ? Osteoarthritis   ? knees, hands, fingers  ? Osteoarthritis   ? PONV (postoperative nausea and vomiting)   ? Skin cancer   ? ? ?Patient's surgical history, family medical history, social history, medications and allergies were all reviewed in Epic  ? ? ?Current Outpatient Medications  ?Medication Sig Dispense Refill  ? cycloSPORINE (RESTASIS) 0.05 % ophthalmic emulsion Place 1 drop into both eyes 2 (two) times daily. 0.4 mL 1  ? dicyclomine (BENTYL) 10 MG capsule Take 1 capsule (10 mg total) by mouth every 6 (six) hours as needed for spasms. 60 capsule 3  ? DULoxetine (CYMBALTA) 60 MG capsule Take 1 capsule (60 mg total) by mouth 2 (two) times daily. 180 capsule 2  ? gabapentin (NEURONTIN) 300 MG capsule Take 1 capsule (300 mg total) by mouth 3 (three) times daily. 270 capsule 1  ? levothyroxine (SYNTHROID) 50 MCG tablet Take 1 tablet (50 mcg total) by mouth daily before breakfast. 90 tablet 2  ? methocarbamol (ROBAXIN) 500 MG tablet Take 1 tablet (500 mg total) by mouth every 6 (six) hours as needed for muscle spasms. 20 tablet 0  ? RABEprazole (ACIPHEX) 20 MG tablet Take 1 tablet (20 mg total) by mouth 2 (two) times daily. 60 tablet 3  ? simvastatin (ZOCOR) 40 MG tablet Take 1 tablet (40 mg total) by mouth daily. 90 tablet 2  ? tamoxifen (NOLVADEX) 20 MG tablet Take 1 tablet (20 mg total) by mouth daily. 90 tablet 1  ? ?No current facility-administered medications for this visit.  ? ? ?Physical Exam:   ? ? ?BP 126/84 (BP Location: Right Arm, Patient Position: Sitting, Cuff Size: Normal)   Pulse (!) 58   Ht '5\' 2"'$  (1.575 m)   Wt 124 lb 6 oz (56.4 kg)   SpO2 99%   BMI 22.75 kg/m?  ? ?GENERAL:  Pleasant female in NAD ?PSYCH: : Cooperative, normal affect ?NEURO: Alert and oriented x 3, no focal neurologic deficits ? ? ?IMPRESSION and PLAN:   ? ?1) Abnormal imaging study ?2) Change in bowel habits ?3) Constipation ?-  Colonoscopy to evaluate wall thickening and stricturing of the sigmoid colon ?-  Plan for pediatric colonoscope along with abdominal binder placement due to redundant colon on CT and history of tortuous colon on prior colonoscopy ?- Continue MiraLAX 1 cap/day and titrate to soft stools without straining to have BM ?- Extended 2-day bowel preparation ? ?4) History of colon polyps ?- Continue polyp surveillance at time of colonoscopy as above ? ?5) GERD ?6) Hiatal hernia ?- Well-controlled with Aciphex ?- Continue antireflux lifestyle/dietary modifications ? ?The indications, risks, and benefits of colonoscopy were explained to the patient in detail. Risks include but are not limited to bleeding, perforation, adverse reaction to medications, and cardiopulmonary compromise. Sequelae include but are not limited to the possibility of surgery, hospitalization, and mortality. The patient verbalized understanding and wished to proceed. All questions answered, referred to the scheduler and bowel prep ordered. Further recommendations pending results of the exam.  ?    ? ?Lavena Bullion ,DO, FACG 02/14/2022, 8:31 AM ? ?

## 2022-02-18 ENCOUNTER — Encounter: Payer: Self-pay | Admitting: Gastroenterology

## 2022-02-25 ENCOUNTER — Ambulatory Visit (AMBULATORY_SURGERY_CENTER): Payer: Medicare HMO | Admitting: Gastroenterology

## 2022-02-25 ENCOUNTER — Encounter: Payer: Self-pay | Admitting: Gastroenterology

## 2022-02-25 VITALS — BP 145/86 | HR 63 | Temp 97.7°F | Resp 7 | Ht 62.0 in | Wt 124.0 lb

## 2022-02-25 DIAGNOSIS — R194 Change in bowel habit: Secondary | ICD-10-CM

## 2022-02-25 DIAGNOSIS — Z86010 Personal history of colonic polyps: Secondary | ICD-10-CM | POA: Diagnosis not present

## 2022-02-25 DIAGNOSIS — Z538 Procedure and treatment not carried out for other reasons: Secondary | ICD-10-CM

## 2022-02-25 DIAGNOSIS — R933 Abnormal findings on diagnostic imaging of other parts of digestive tract: Secondary | ICD-10-CM | POA: Diagnosis not present

## 2022-02-25 DIAGNOSIS — R9389 Abnormal findings on diagnostic imaging of other specified body structures: Secondary | ICD-10-CM | POA: Diagnosis not present

## 2022-02-25 DIAGNOSIS — K219 Gastro-esophageal reflux disease without esophagitis: Secondary | ICD-10-CM | POA: Diagnosis not present

## 2022-02-25 DIAGNOSIS — K5730 Diverticulosis of large intestine without perforation or abscess without bleeding: Secondary | ICD-10-CM | POA: Diagnosis not present

## 2022-02-25 MED ORDER — SODIUM CHLORIDE 0.9 % IV SOLN: 500.0000 mL | Freq: Once | INTRAVENOUS | Status: DC

## 2022-02-25 MED ORDER — SUTAB 1479-225-188 MG PO TABS: 1.0000 | ORAL_TABLET | Freq: Once | ORAL | 0 refills | Status: AC

## 2022-02-25 NOTE — Patient Instructions (Signed)

## 2022-02-25 NOTE — Op Note (Signed)
[place] ?Patient Name: Kenleigh Toback ?Procedure Date: 02/25/2022 11:03 AM ?MRN: [id number] ?Endoscopist: Gerrit Heck , MD ?Age: 76 ?Referring MD:  ?Date of Birth: 1946-05-21 ?Gender: Female ?Account #: [id number] ?Procedure:                Colonoscopy ?Indications:              Abnormal CT of the GI tract, Change in bowel  ?                          habits, Constipation ?                          ???11/11/2021: CT abdomen/pelvis: Focal wall  ?                          thickening of the sigmoid colon without adjacent  ?                          inflammatory change. No evidence of obstruction. ?                          ???01/13/2022: CT abdomen/pelvis: Moderate size hiatal  ?                          hernia, large volume stool in colon, redundant  ?                          sigmoid/descending colon. Short 2 cm focus of  ?                          stricturing in the sigmoid colon. No stool distal  ?                          to the narrowing. 2 small left hepatic lobe cysts,  ?                          unchanged from prior. ?Medicines:                Monitored Anesthesia Care ?Procedure:                Pre-Anesthesia Assessment: ?                          - Prior to the procedure, a History and Physical  ?                          was performed, and patient medications and  ?                          allergies were reviewed. The patient's tolerance of  ?                          previous anesthesia was also reviewed. The risks  ?  and benefits of the procedure and the sedation  ?                          options and risks were discussed with the patient.  ?                          All questions were answered, and informed consent  ?                          was obtained. Prior Anticoagulants: The patient has  ?                          taken no previous anticoagulant or antiplatelet  ?                          agents. ASA Grade Assessment: II - A patient with  ?                           mild systemic disease. After reviewing the risks  ?                          and benefits, the patient was deemed in  ?                          satisfactory condition to undergo the procedure. ?                          After obtaining informed consent, the colonoscope  ?                          was passed under direct vision. Throughout the  ?                          procedure, the patient's blood pressure, pulse, and  ?                          oxygen saturations were monitored continuously. The  ?                          Colonoscope was introduced through the anus with  ?                          the intention of advancing to the cecum. The scope  ?                          was advanced to the sigmoid colon before the  ?                          procedure was aborted. Medications were given. The  ?                          colonoscopy was technically difficult and complex  ?  due to poor bowel prep. The patient tolerated the  ?                          procedure well. The quality of the bowel  ?                          preparation was poor. The rectum was photographed. ?Scope In: 11:17:04 AM ?Scope Out: 11:23:13 AM ?Total Procedure Duration: 0 hours 6 minutes 9 seconds  ?Findings:                 The perianal and digital rectal examinations were  ?                          normal. ?                          A large amount of stool was found in the rectum, in  ?                          the recto-sigmoid colon and in the sigmoid colon,  ?                          precluding visualization. Lavage of the area was  ?                          performed using copious amounts of tap water,  ?                          resulting in incomplete clearance with continued  ?                          poor visualization. Advanced the colonoscope  ?                          approximately 45 cm from the anal verge, but the  ?                          colon lumen was completely obscured by stool,   ?                          precluding any continued safe advancement. ?                          A few small-mouthed diverticula were found in the  ?                          sigmoid colon. ?                          The retroflexed view of the distal rectum and anal  ?                          verge was normal and showed no anal or rectal  ?  abnormalities. ?Complications:            No immediate complications. ?Estimated Blood Loss:     Estimated blood loss: none. ?Impression:               - Preparation of the colon was poor. ?                          - Stool in the rectum, in the recto-sigmoid colon  ?                          and in the sigmoid colon. ?                          - Diverticulosis in the sigmoid colon. ?                          - The distal rectum and anal verge are normal on  ?                          retroflexion view. ?                          - No specimens collected. ?Recommendation:           - Patient has a contact number available for  ?                          emergencies. The signs and symptoms of potential  ?                          delayed complications were discussed with the  ?                          patient. Return to normal activities tomorrow.  ?                          Written discharge instructions were provided to the  ?                          patient. ?                          - Resume previous diet. ?                          - Continue present medications. ?                          - Repeat colonoscopy at the next available  ?                          appointment because the bowel preparation was poor. ?                          - Start Miralax 1 cap/day and titrate to soft  ?  stools. ?                          - Plan for exended preparation with the following: ?                          - Increase Miralax to 1 cap TID starting 3 days  ?                          prior to colonoscopy ?                          -  Clear liquids x2 days ?                          - Dulcolax 10 mg BID starting 2 days prior to  ?                          colonoscopy ?                          - Sutabs full bowel preparation 2 days prior to  ?                          colonsocopy ?                          - Clenpiq full bowel prepration night prior to  ?                          colonsocopy ?                          - Fleet or tap water enema prn ?Gerrit Heck, MD ?02/25/2022 11:33:27 AM ?

## 2022-02-25 NOTE — Progress Notes (Signed)
Pt's states no medical or surgical changes since previsit or office visit. 

## 2022-02-25 NOTE — Progress Notes (Signed)
? ?GASTROENTEROLOGY PROCEDURE H&P NOTE  ? ?Primary Care Physician: ?[name], DO ? ? ? ?Reason for Procedure:   Change in bowel habits, abnormal imaging study, constipation, history of colon polyps/polyp surveillance  ? ?Plan:    Colonoscopy ? ?Patient is appropriate for endoscopic procedure(s) in the ambulatory ([facility]) setting. ? ?The nature of the procedure, as well as the risks, benefits, and alternatives were carefully and thoroughly reviewed with the patient. Ample time for discussion and questions allowed. The patient understood, was satisfied, and agreed to proceed.  ? ? ? ?HPI: ?Gina Hunt is a 76 y.o. female who presents for Colonoscopy for evaluation of change in bowel habits, constipation, and abnroaml CT, along with ongoing polyp surveillance.  Patient was most recently seen in the Gastroenterology Clinic on 02/14/2022 by me.  No interval change in medical history since that appointment. Please refer to that note for full details regarding GI history and clinical presentation.  ? ?Past Medical History:  ?Diagnosis Date  ? Anxiety   ? Breast cancer ([place]) 01/2021  ? left breast IDC  ? Complication of anesthesia   ? Family history of breast cancer 12/16/2020  ? Family history of gastric cancer 12/16/2020  ? Fibromyalgia   ? GERD (gastroesophageal reflux disease)   ? Hyperlipidemia   ? Hypothyroidism   ? Osteoarthritis   ? knees, hands, fingers  ? Osteoarthritis   ? PONV (postoperative nausea and vomiting)   ? Skin cancer   ? ? ?Past Surgical History:  ?Procedure Laterality Date  ? ABDOMINAL HYSTERECTOMY    ? COLONOSCOPY  11/24/2015  ? EVACUATION BREAST HEMATOMA Left 04/07/2021  ? Procedure: EVACUATION HEMATOMA BREAST;  Surgeon: Rolm Bookbinder, MD;  Location: [facility];  Service: General;  Laterality: Left;  ? LIVER SURGERY    ? PORTA CATH INSERTION Right 04/07/2021  ? Procedure: PORTA CATH INSERTION;  Surgeon: Rolm Bookbinder, MD;  Location: [facility];   Service: General;  Laterality: Right;  ? SIMPLE MASTECTOMY WITH AXILLARY SENTINEL NODE BIOPSY Left 04/07/2021  ? Procedure: LEFT SIMPLE MASTECTOMY WITH LEFT AXILLARY SENTINEL NODE BIOPSY;  Surgeon: Rolm Bookbinder, MD;  Location: [facility];  Service: General;  Laterality: Left;  ? TONSILLECTOMY    ? ? ?Prior to Admission medications   ?Medication Sig Start Date End Date Taking? Authorizing Provider  ?cycloSPORINE (RESTASIS) 0.05 % ophthalmic emulsion Place 1 drop into both eyes 2 (two) times daily. 10/11/21  Yes Magrinat, [name], MD  ?dicyclomine (BENTYL) 10 MG capsule Take 1 capsule (10 mg total) by mouth every 6 (six) hours as needed for spasms. 10/26/21  Yes Beckham Capistran V, DO  ?DULoxetine (CYMBALTA) 60 MG capsule Take 1 capsule (60 mg total) by mouth 2 (two) times daily. 11/23/21  Yes [name], DO  ?gabapentin (NEURONTIN) 300 MG capsule Take 1 capsule (300 mg total) by mouth 3 (three) times daily. 11/23/21  Yes [name], DO  ?levothyroxine (SYNTHROID) 50 MCG tablet Take 1 tablet (50 mcg total) by mouth daily before breakfast. 11/23/21  Yes Wendling, Crosby Oyster, DO  ?methocarbamol (ROBAXIN) 500 MG tablet Take 1 tablet (500 mg total) by mouth every 6 (six) hours as needed for muscle spasms. 04/08/21  Yes Rolm Bookbinder, MD  ?RABEprazole (ACIPHEX) 20 MG tablet Take 1 tablet (20 mg total) by mouth 2 (two) times daily. 10/26/21  Yes Keryn Nessler V, DO  ?simvastatin (ZOCOR) 40 MG tablet Take 1 tablet (40 mg total) by mouth daily.  11/23/21  Yes [name], DO  ?tamoxifen (NOLVADEX) 20 MG tablet Take 1 tablet (20 mg total) by mouth daily. 11/25/21  Yes Benay Pike, MD  ?traMADol (ULTRAM) 50 MG tablet tramadol 50 mg tablet ? TAKE 1 TABLET BY MOUTH EVERY 6 HOURS AS NEEDED   Yes [provider]  ?cephALEXin (KEFLEX) 500 MG capsule cephalexin 500 mg capsule ? TAKE 1 CAPSULE BY MOUTH THREE TIMES DAILY FOR 7 DAYS ?Patient not taking: Reported  on 02/25/2022    [provider]  ?pantoprazole (PROTONIX) 40 MG tablet Take by mouth. ?Patient not taking: Reported on 02/25/2022 11/23/21   [provider]  ? ? ?Current Outpatient Medications  ?Medication Sig Dispense Refill  ? cycloSPORINE (RESTASIS) 0.05 % ophthalmic emulsion Place 1 drop into both eyes 2 (two) times daily. 0.4 mL 1  ? dicyclomine (BENTYL) 10 MG capsule Take 1 capsule (10 mg total) by mouth every 6 (six) hours as needed for spasms. 60 capsule 3  ? DULoxetine (CYMBALTA) 60 MG capsule Take 1 capsule (60 mg total) by mouth 2 (two) times daily. 180 capsule 2  ? gabapentin (NEURONTIN) 300 MG capsule Take 1 capsule (300 mg total) by mouth 3 (three) times daily. 270 capsule 1  ? levothyroxine (SYNTHROID) 50 MCG tablet Take 1 tablet (50 mcg total) by mouth daily before breakfast. 90 tablet 2  ? methocarbamol (ROBAXIN) 500 MG tablet Take 1 tablet (500 mg total) by mouth every 6 (six) hours as needed for muscle spasms. 20 tablet 0  ? RABEprazole (ACIPHEX) 20 MG tablet Take 1 tablet (20 mg total) by mouth 2 (two) times daily. 60 tablet 3  ? simvastatin (ZOCOR) 40 MG tablet Take 1 tablet (40 mg total) by mouth daily. 90 tablet 2  ? tamoxifen (NOLVADEX) 20 MG tablet Take 1 tablet (20 mg total) by mouth daily. 90 tablet 1  ? traMADol (ULTRAM) 50 MG tablet tramadol 50 mg tablet ? TAKE 1 TABLET BY MOUTH EVERY 6 HOURS AS NEEDED    ? cephALEXin (KEFLEX) 500 MG capsule cephalexin 500 mg capsule ? TAKE 1 CAPSULE BY MOUTH THREE TIMES DAILY FOR 7 DAYS (Patient not taking: Reported on 02/25/2022)    ? pantoprazole (PROTONIX) 40 MG tablet Take by mouth. (Patient not taking: Reported on 02/25/2022)    ? ?Current Facility-Administered Medications  ?Medication Dose Route Frequency Provider Last Rate Last Admin  ? 0.9 %  sodium chloride infusion  500 mL Intravenous Once Treacy Holcomb V, DO      ? ? ?Allergies as of 02/25/2022 - Review Complete 02/25/2022  ?Allergen Reaction Noted  ? Codeine Other (See  Comments) 01/20/1996  ? Morphine and related Other (See Comments) 09/07/2016  ? ? ?Family History  ?Problem Relation Age of Onset  ? Hypertension Mother   ? Diabetes Mother   ? Arthritis Mother   ? Heart disease Mother   ? Rheum arthritis Father   ? Diabetes Father   ? Heart disease Father   ? Stomach cancer Brother 96  ? Stomach cancer Brother 71  ? Esophageal cancer Brother 83  ? Breast cancer Maternal Aunt 3  ? Breast cancer Maternal Aunt 72  ? Breast cancer Maternal Aunt 18  ? Colon cancer Neg Hx   ? Colon polyps Neg Hx   ? ? ?Social History  ? ?Socioeconomic History  ? Marital status: Widowed  ?  Spouse name: Not on file  ? Number of children: Not on file  ? Years of education: Not  on file  ? Highest education level: Not on file  ?Occupational History  ? Not on file  ?Tobacco Use  ? Smoking status: Never  ? Smokeless tobacco: Never  ?Vaping Use  ? Vaping Use: Never used  ?Substance and Sexual Activity  ? Alcohol use: Yes  ?  Comment: rarely   ? Drug use: Never  ? Sexual activity: Not Currently  ?  Birth control/protection: Surgical  ?Other Topics Concern  ? Not on file  ?Social History Narrative  ? Not on file  ? ?Social Determinants of Health  ? ?Financial Resource Strain: Low Risk   ? Difficulty of Paying Living Expenses: Not hard at all  ?Food Insecurity: No Food Insecurity  ? Worried About Charity fundraiser in the Last Year: Never true  ? Ran Out of Food in the Last Year: Never true  ?Transportation Needs: No Transportation Needs  ? Lack of Transportation (Medical): No  ? Lack of Transportation (Non-Medical): No  ?Physical Activity: Sufficiently Active  ? Days of Exercise per Week: 6 days  ? Minutes of Exercise per Session: 60 min  ?Stress: Stress Concern Present  ? Feeling of Stress : To some extent  ?Social Connections: Not on file  ?Intimate Partner Violence: Not on file  ? ? ?Physical Exam: ?Vital signs in last 24 hours: ?'@BP'$  (!) 146/76   Pulse 62   Temp 97.7 ?F (36.5 ?C) (Temporal)   Ht '5\' 2"'$   (1.575 m)   Wt 124 lb (56.2 kg)   SpO2 100%   BMI 22.68 kg/m?  ?GEN: NAD ?EYE: Sclerae anicteric ?ENT: MMM ?CV: Non-tachycardic ?Pulm: CTA b/l ?GI: Soft, NT/ND ?NEURO:  Alert & Oriented x 3 ? ? ?Gerrit Heck, D

## 2022-02-25 NOTE — Progress Notes (Signed)
Report to PACU, RN, vss, BBS= Clear.  

## 2022-03-01 ENCOUNTER — Telehealth: Payer: Self-pay

## 2022-03-01 ENCOUNTER — Telehealth: Payer: Self-pay | Admitting: *Deleted

## 2022-03-01 NOTE — Telephone Encounter (Signed)
Patient is returning your call. Per patient, feels okay. No concerns at this moment. ?

## 2022-03-01 NOTE — Telephone Encounter (Signed)
No answer, left message to call back later today, B.Sinead Hockman RN. 

## 2022-03-01 NOTE — Telephone Encounter (Signed)
?  Follow up Call- ? ? ?  02/25/2022  ? 10:25 AM 01/10/2022  ?  8:26 AM  ?Call back number  ?Post procedure Call Back phone  # [phone] Bethena Roys [phone]  ?Permission to leave phone message Yes Yes  ?  ? ?Patient questions: ? ?Do you have a fever, pain , or abdominal swelling? No. ?Pain Score  0 * ? ?Have you tolerated food without any problems? Yes.   ? ?Have you been able to return to your normal activities? Yes.   ? ?Do you have any questions about your discharge instructions: ?Diet   No. ?Medications  No. ?Follow up visit  No. ? ?Do you have questions or concerns about your Care? No. ? ?Actions: ?* If pain score is 4 or above: ?No action needed, pain <4. ? ? ?

## 2022-03-01 NOTE — Telephone Encounter (Signed)
Second attempt follow up call to pt, lm for pt to call if having any problems or questions, otherwise we will call them back later this morning or early this afternoon.  ? ?Spoke with pt's carepartner she had said we could call and she said pt is doing well without any problems. ?

## 2022-03-03 DIAGNOSIS — M65321 Trigger finger, right index finger: Secondary | ICD-10-CM | POA: Diagnosis not present

## 2022-03-03 DIAGNOSIS — M65331 Trigger finger, right middle finger: Secondary | ICD-10-CM | POA: Diagnosis not present

## 2022-03-03 HISTORY — PX: TRIGGER FINGER RELEASE: SHX641

## 2022-03-17 DIAGNOSIS — M25641 Stiffness of right hand, not elsewhere classified: Secondary | ICD-10-CM | POA: Diagnosis not present

## 2022-03-29 ENCOUNTER — Encounter: Payer: Self-pay | Admitting: Gastroenterology

## 2022-03-29 ENCOUNTER — Telehealth: Payer: Self-pay | Admitting: Gastroenterology

## 2022-03-29 NOTE — Telephone Encounter (Signed)
Inbound call from patient stating she would like to speak with nurse. Please advise.

## 2022-03-29 NOTE — Telephone Encounter (Signed)
Left detailed message on pt's voice mail

## 2022-03-29 NOTE — Telephone Encounter (Signed)
My chart message sent to pt.

## 2022-03-29 NOTE — Telephone Encounter (Signed)
Spoke with pt who stated she was unable to pick up prep. Called pharmacy to see why and pharmacy stated that prep required a prior auth. Sutab sample placed at front desk on second floor. Attempted to call pt and let her know. Left voicemail for pt to call back.

## 2022-03-31 ENCOUNTER — Ambulatory Visit (AMBULATORY_SURGERY_CENTER): Payer: Medicare HMO | Admitting: Gastroenterology

## 2022-03-31 ENCOUNTER — Encounter: Payer: Self-pay | Admitting: Gastroenterology

## 2022-03-31 VITALS — BP 146/65 | HR 69 | Temp 98.6°F | Resp 12 | Ht 62.0 in | Wt 124.0 lb

## 2022-03-31 DIAGNOSIS — D123 Benign neoplasm of transverse colon: Secondary | ICD-10-CM

## 2022-03-31 DIAGNOSIS — K5900 Constipation, unspecified: Secondary | ICD-10-CM

## 2022-03-31 DIAGNOSIS — K635 Polyp of colon: Secondary | ICD-10-CM | POA: Diagnosis not present

## 2022-03-31 DIAGNOSIS — Z86010 Personal history of colonic polyps: Secondary | ICD-10-CM | POA: Diagnosis not present

## 2022-03-31 DIAGNOSIS — R9389 Abnormal findings on diagnostic imaging of other specified body structures: Secondary | ICD-10-CM

## 2022-03-31 DIAGNOSIS — R933 Abnormal findings on diagnostic imaging of other parts of digestive tract: Secondary | ICD-10-CM

## 2022-03-31 DIAGNOSIS — M797 Fibromyalgia: Secondary | ICD-10-CM | POA: Diagnosis not present

## 2022-03-31 DIAGNOSIS — R194 Change in bowel habit: Secondary | ICD-10-CM

## 2022-03-31 DIAGNOSIS — K5730 Diverticulosis of large intestine without perforation or abscess without bleeding: Secondary | ICD-10-CM | POA: Diagnosis not present

## 2022-03-31 MED ORDER — SODIUM CHLORIDE 0.9 % IV SOLN: 500.0000 mL | Freq: Once | INTRAVENOUS | Status: DC

## 2022-03-31 NOTE — Progress Notes (Signed)
Abd pressure from approx 1048-1100 to get to and stay in [facility]

## 2022-03-31 NOTE — Op Note (Signed)
[facility] Patient Name: Gina Hunt Procedure Date: 03/31/2022 10:34 AM MRN: [id number] Endoscopist: Gerrit Heck , MD Age: 76 Referring MD:  Date of Birth: 1946-09-13 Gender: Female Account #: [id number] Procedure:                Colonoscopy Indications:              Abnormal CT of the GI tract, Change in bowel                            habits, Constipation                           Last completed colonoscopy was in 2020 and notable                            for 6 mm cecal adenoma. Attempted colonsocopy in                            01/2022, but incomplete exam due to poor prep.                            Presents today for repeat attempt at colonsocopy                            afte extended 2-day bowel preparation.                           ?"11/11/2021: CT abdomen/pelvis: Focal wall                            thickening of the sigmoid colon without adjacent                            inflammatory change. No evidence of obstruction.                           ?"01/13/2022: CT abdomen/pelvis: Moderate size hiatal                            hernia, large volume stool in colon, redundant                            sigmoid/descending colon. Short 2 cm focus of                            stricturing in the sigmoid colon. No stool distal                            to the narrowing. 2 small left hepatic lobe cysts,                            unchanged from prior. Medicines:                Monitored Anesthesia Care Procedure:  Pre-Anesthesia Assessment:                           - Prior to the procedure, a History and Physical                            was performed, and patient medications and                            allergies were reviewed. The patient's tolerance of                            previous anesthesia was also reviewed. The risks                            and benefits of the procedure and the sedation                             options and risks were discussed with the patient.                            All questions were answered, and informed consent                            was obtained. Prior Anticoagulants: The patient has                            taken no previous anticoagulant or antiplatelet                            agents. ASA Grade Assessment: III - A patient with                            severe systemic disease. After reviewing the risks                            and benefits, the patient was deemed in                            satisfactory condition to undergo the procedure.                           After obtaining informed consent, the colonoscope                            was passed under direct vision. Throughout the                            procedure, the patient's blood pressure, pulse, and                            oxygen saturations were monitored continuously. The  Olympus PCF-H190DL (#[id number]) Colonoscope was                            introduced through the anus and advanced to the the                            cecum, identified by appendiceal orifice and                            ileocecal valve. The colonoscopy was technically                            difficult and complex due to a redundant colon,                            significant looping and a tortuous colon.                            Successful completion of the procedure was aided by                            applying abdominal pressure. The patient tolerated                            the procedure well. The quality of the bowel                            preparation was good. The ileocecal valve,                            appendiceal orifice, and rectum were photographed. Scope In: 10:40:32 AM Scope Out: 11:14:59 AM Scope Withdrawal Time: 0 hours 15 minutes 25 seconds  Total Procedure Duration: 0 hours 34 minutes 27 seconds  Findings:                 The perianal and digital  rectal examinations were                            normal.                           A 5 mm polyp was found in the transverse colon. The                            polyp was flat with adherent mucus cap. The polyp                            was removed with a cold snare. Resection and                            retrieval were complete. Estimated blood loss was                            minimal.  Multiple small-mouthed diverticula were found in                            the sigmoid colon.                           The sigmoid colon was significantly tortuous.                           The ascending colon revealed significantly                            excessive looping. Advancing the scope required                            using manual pressure.                           The retroflexed view of the distal rectum and anal                            verge was normal and showed no anal or rectal                            abnormalities. Complications:            No immediate complications. Estimated Blood Loss:     Estimated blood loss was minimal. Impression:               - One 5 mm polyp in the transverse colon, removed                            with a cold snare. Resected and retrieved.                           - Diverticulosis in the sigmoid colon.                           - Tortuous colon, particularly in the sigmoid                            colon. However, able to fully insulfate this area                            and no areas of mucosal erythema, edema, erosions,                            ulceration, and no stricture or obstructing lesions.                           - There was significant looping of the colon.                           - The distal rectum and anal verge are normal on  retroflexion view. Recommendation:           - Patient has a contact number available for                            emergencies. The  signs and symptoms of potential                            delayed complications were discussed with the                            patient. Return to normal activities tomorrow.                            Written discharge instructions were provided to the                            patient.                           - Resume previous diet.                           - Continue present medications.                           - Await pathology results.                           - Repeat colonoscopy for surveillance based on                            pathology results.                           - Return to GI office at appointment to be                            scheduled. Gerrit Heck, MD 03/31/2022 11:22:57 AM

## 2022-03-31 NOTE — Patient Instructions (Signed)
YOU HAD AN ENDOSCOPIC PROCEDURE TODAY AT THE [facility]:   Refer to the procedure report that was given to you for any specific questions about what was found during the examination.  If the procedure report does not answer your questions, please call your gastroenterologist to clarify.  If you requested that your care partner not be given the details of your procedure findings, then the procedure report has been included in a sealed envelope for you to review at your convenience later.  YOU SHOULD EXPECT: Some feelings of bloating in the abdomen. Passage of more gas than usual.  Walking can help get rid of the air that was put into your GI tract during the procedure and reduce the bloating. If you had a lower endoscopy (such as a colonoscopy or flexible sigmoidoscopy) you may notice spotting of blood in your stool or on the toilet paper. If you underwent a bowel prep for your procedure, you may not have a normal bowel movement for a few days.  Please Note:  You might notice some irritation and congestion in your nose or some drainage.  This is from the oxygen used during your procedure.  There is no need for concern and it should clear up in a day or so.  SYMPTOMS TO REPORT IMMEDIATELY:   Following lower endoscopy (colonoscopy or flexible sigmoidoscopy):  Excessive amounts of blood in the stool  Significant tenderness or worsening of abdominal pains  Swelling of the abdomen that is new, acute  Fever of 100F or higher   Following upper endoscopy (EGD)  Vomiting of blood or coffee ground material  New chest pain or pain under the shoulder blades  Painful or persistently difficult swallowing  New shortness of breath  Fever of 100F or higher  Black, tarry-looking stools  For urgent or emergent issues, a gastroenterologist can be reached at any hour by calling [phone]. Do not use MyChart messaging for urgent concerns.    DIET:  We do recommend a small meal at first, but  then you may proceed to your regular diet.  Drink plenty of fluids but you should avoid alcoholic beverages for 24 hours.  ACTIVITY:  You should plan to take it easy for the rest of today and you should NOT DRIVE or use heavy machinery until tomorrow (because of the sedation medicines used during the test).    FOLLOW UP: Our staff will call the number listed on your records 48-72 hours following your procedure to check on you and address any questions or concerns that you may have regarding the information given to you following your procedure. If we do not reach you, we will leave a message.  We will attempt to reach you two times.  During this call, we will ask if you have developed any symptoms of COVID 19. If you develop any symptoms (ie: fever, flu-like symptoms, shortness of breath, cough etc.) before then, please call [phone].  If you test positive for Covid 19 in the 2 weeks post procedure, please call and report this information to us.    If any biopsies were taken you will be contacted by phone or by letter within the next 1-3 weeks.  Please call us at [phone] if you have not heard about the biopsies in 3 weeks.    SIGNATURES/CONFIDENTIALITY: You and/or your care partner have signed paperwork which will be entered into your electronic medical record.  These signatures attest to the fact that that the information above on   your After Visit Summary has been reviewed and is understood.  Full responsibility of the confidentiality of this discharge information lies with you and/or your care-partner. 

## 2022-03-31 NOTE — Progress Notes (Signed)
Report to PACU, RN, vss, BBS= Clear.  

## 2022-03-31 NOTE — Progress Notes (Signed)
GASTROENTEROLOGY PROCEDURE H&P NOTE   Primary Care Physician: [name], DO    Reason for Procedure:  Abnormal imaging study, change in bowel habits, constipation, history of colon polyps  Plan:    Colonoscopy  Patient is appropriate for endoscopic procedure(s) in the ambulatory ([facility]) setting.  The nature of the procedure, as well as the risks, benefits, and alternatives were carefully and thoroughly reviewed with the patient. Ample time for discussion and questions allowed. The patient understood, was satisfied, and agreed to proceed.     HPI: Gina Hunt is a 76 y.o. female who presents for colonoscopy for evaluation of recent abnormal imaging studies x2 as outlined below, along with change in bowel habits, constipation, and for continued surveillance given personal history of colon polyps.  -11/11/2021: CT abdomen/pelvis: Focal wall thickening of the sigmoid colon without adjacent inflammatory change.  No evidence of obstruction. -01/13/2022: CT abdomen/pelvis: Moderate size hiatal hernia, large volume stool in colon, redundant sigmoid/descending colon.  Short 2 cm focus of stricturing in the sigmoid colon.  No stool distal to the narrowing.  2 small left hepatic lobe cysts, unchanged from prior.   Endoscopic History: - Colonoscopy (11/2015): Inadequate prep.  7 mm polyp removed from 5 cm into the rectum (path benign).  Recommended repeat in 3 years due to inadequate prep. - Colonoscopy (05/2019): 6 mm cecal polyp (path: Tubular adenoma), transverse/sigmoid diverticulosis, moderately tortuous.  Repeat in 7 years (vs no repeat due to age and patient preference) - EGD (12/2021): Mild stricture lower esophagus dilated with 18 mm TTS balloon.  5 cm HH, mild non-H. pylori gastritis - Colonoscopy (02/17/2022): Procedure aborted due to poor bowel preparation and only able to advance to the sigmoid colon.  Sigmoid diverticulosis  Past Medical History:  Diagnosis Date    Anxiety    Breast cancer ([place]) 01/2021   left breast IDC   Complication of anesthesia    Family history of breast cancer 12/16/2020   Family history of gastric cancer 12/16/2020   Fibromyalgia    GERD (gastroesophageal reflux disease)    Hyperlipidemia    Hypothyroidism    Osteoarthritis    knees, hands, fingers   Osteoarthritis    PONV (postoperative nausea and vomiting)    Skin cancer     Past Surgical History:  Procedure Laterality Date   ABDOMINAL HYSTERECTOMY     COLONOSCOPY  11/24/2015   EVACUATION BREAST HEMATOMA Left 04/07/2021   Procedure: EVACUATION HEMATOMA BREAST;  Surgeon: Rolm Bookbinder, MD;  Location: [facility];  Service: General;  Laterality: Left;   LIVER SURGERY     PORTA CATH INSERTION Right 04/07/2021   Procedure: PORTA CATH INSERTION;  Surgeon: Rolm Bookbinder, MD;  Location: [facility];  Service: General;  Laterality: Right;   SIMPLE MASTECTOMY WITH AXILLARY SENTINEL NODE BIOPSY Left 04/07/2021   Procedure: LEFT SIMPLE MASTECTOMY WITH LEFT AXILLARY SENTINEL NODE BIOPSY;  Surgeon: Rolm Bookbinder, MD;  Location: [facility];  Service: General;  Laterality: Left;   TONSILLECTOMY     TRIGGER FINGER RELEASE Right 03/03/2022    Prior to Admission medications   Medication Sig Start Date End Date Taking? Authorizing Provider  cycloSPORINE (RESTASIS) 0.05 % ophthalmic emulsion Place 1 drop into both eyes 2 (two) times daily. 10/11/21  Yes Magrinat, [name], MD  dicyclomine (BENTYL) 10 MG capsule Take 1 capsule (10 mg total) by mouth every 6 (six) hours as needed for spasms. 10/26/21  Yes Maebelle Sulton, Dominic Pea, DO  DULoxetine (CYMBALTA) 60 MG capsule Take 1 capsule (60 mg total) by mouth 2 (two) times daily. 11/23/21  Yes [name], DO  gabapentin (NEURONTIN) 300 MG capsule Take 1 capsule (300 mg total) by mouth 3 (three) times daily. 11/23/21  Yes [name], DO  levothyroxine (SYNTHROID)  50 MCG tablet Take 1 tablet (50 mcg total) by mouth daily before breakfast. 11/23/21  Yes Wendling, Crosby Oyster, DO  methocarbamol (ROBAXIN) 500 MG tablet Take 1 tablet (500 mg total) by mouth every 6 (six) hours as needed for muscle spasms. 04/08/21  Yes Rolm Bookbinder, MD  pantoprazole (PROTONIX) 40 MG tablet Take by mouth. 11/23/21  Yes [provider]  simvastatin (ZOCOR) 40 MG tablet Take 1 tablet (40 mg total) by mouth daily. 11/23/21  Yes [name], DO  tamoxifen (NOLVADEX) 20 MG tablet Take 1 tablet (20 mg total) by mouth daily. 11/25/21  Yes Iruku, Arletha Pili, MD  traMADol (ULTRAM) 50 MG tablet tramadol 50 mg tablet  TAKE 1 TABLET BY MOUTH EVERY 6 HOURS AS NEEDED   Yes [provider]  RABEprazole (ACIPHEX) 20 MG tablet Take 1 tablet (20 mg total) by mouth 2 (two) times daily. 10/26/21   Tychelle Purkey, Dominic Pea, DO    Current Outpatient Medications  Medication Sig Dispense Refill   cycloSPORINE (RESTASIS) 0.05 % ophthalmic emulsion Place 1 drop into both eyes 2 (two) times daily. 0.4 mL 1   dicyclomine (BENTYL) 10 MG capsule Take 1 capsule (10 mg total) by mouth every 6 (six) hours as needed for spasms. 60 capsule 3   DULoxetine (CYMBALTA) 60 MG capsule Take 1 capsule (60 mg total) by mouth 2 (two) times daily. 180 capsule 2   gabapentin (NEURONTIN) 300 MG capsule Take 1 capsule (300 mg total) by mouth 3 (three) times daily. 270 capsule 1   levothyroxine (SYNTHROID) 50 MCG tablet Take 1 tablet (50 mcg total) by mouth daily before breakfast. 90 tablet 2   methocarbamol (ROBAXIN) 500 MG tablet Take 1 tablet (500 mg total) by mouth every 6 (six) hours as needed for muscle spasms. 20 tablet 0   pantoprazole (PROTONIX) 40 MG tablet Take by mouth.     simvastatin (ZOCOR) 40 MG tablet Take 1 tablet (40 mg total) by mouth daily. 90 tablet 2   tamoxifen (NOLVADEX) 20 MG tablet Take 1 tablet (20 mg total) by mouth daily. 90 tablet 1   traMADol (ULTRAM) 50 MG tablet  tramadol 50 mg tablet  TAKE 1 TABLET BY MOUTH EVERY 6 HOURS AS NEEDED     RABEprazole (ACIPHEX) 20 MG tablet Take 1 tablet (20 mg total) by mouth 2 (two) times daily. 60 tablet 3   Current Facility-Administered Medications  Medication Dose Route Frequency Provider Last Rate Last Admin   0.9 %  sodium chloride infusion  500 mL Intravenous Once Ebubechukwu Jedlicka V, DO        Allergies as of 03/31/2022 - Review Complete 03/31/2022  Allergen Reaction Noted   Codeine Other (See Comments) 01/20/1996   Morphine and related Other (See Comments) 09/07/2016    Family History  Problem Relation Age of Onset   Hypertension Mother    Diabetes Mother    Arthritis Mother    Heart disease Mother    Rheum arthritis Father    Diabetes Father    Heart disease Father    Stomach cancer Brother 19   Stomach cancer Brother 61   Esophageal cancer Brother 82   Breast cancer Maternal Aunt  62   Breast cancer Maternal Aunt 72   Breast cancer Maternal Aunt 73   Colon cancer Neg Hx    Colon polyps Neg Hx     Social History   Socioeconomic History   Marital status: Widowed    Spouse name: Not on file   Number of children: Not on file   Years of education: Not on file   Highest education level: Not on file  Occupational History   Not on file  Tobacco Use   Smoking status: Never   Smokeless tobacco: Never  Vaping Use   Vaping Use: Never used  Substance and Sexual Activity   Alcohol use: Yes    Comment: rarely    Drug use: Never   Sexual activity: Not Currently    Birth control/protection: Surgical  Other Topics Concern   Not on file  Social History Narrative   Not on file   Social Determinants of Health   Financial Resource Strain: Low Risk    Difficulty of Paying Living Expenses: Not hard at all  Food Insecurity: No Food Insecurity   Worried About Charity fundraiser in the Last Year: Never true   [place] in the Last Year: Never true  Transportation Needs: No Transportation  Needs   Lack of Transportation (Medical): No   Lack of Transportation (Non-Medical): No  Physical Activity: Sufficiently Active   Days of Exercise per Week: 6 days   Minutes of Exercise per Session: 60 min  Stress: Stress Concern Present   Feeling of Stress : To some extent  Social Connections: Not on file  Intimate Partner Violence: Not on file    Physical Exam: Vital signs in last 24 hours: '@BP'$  (!) 158/99   Pulse 70   Temp 98.6 F (37 C)   Ht '5\' 2"'$  (1.575 m)   Wt 124 lb (56.2 kg)   SpO2 97%   BMI 22.68 kg/m  GEN: NAD EYE: Sclerae anicteric ENT: MMM CV: Non-tachycardic Pulm: CTA b/l GI: Soft, NT/ND NEURO:  Alert & Oriented x 3   Gerrit Heck, DO [facility]   03/31/2022 10:31 AM

## 2022-03-31 NOTE — Progress Notes (Signed)
Called to room to assist during endoscopic procedure.  Patient ID and intended procedure confirmed with present staff. Received instructions for my participation in the procedure from the performing physician.  

## 2022-04-01 ENCOUNTER — Telehealth: Payer: Self-pay

## 2022-04-01 NOTE — Telephone Encounter (Signed)
Attempted to reach pt. With follow-up call following endoscopic procedure 03/31/2022.  LM on pt. Voice mail to call if she has any questions or concerns.

## 2022-04-01 NOTE — Telephone Encounter (Signed)
First attempt follow up call to pt, lm for pt to call if having any problems or questions, otherwise we will call them back later this morning or early this afternoon.  °

## 2022-04-04 DIAGNOSIS — M25641 Stiffness of right hand, not elsewhere classified: Secondary | ICD-10-CM | POA: Diagnosis not present

## 2022-04-04 DIAGNOSIS — Z4789 Encounter for other orthopedic aftercare: Secondary | ICD-10-CM | POA: Diagnosis not present

## 2022-04-05 ENCOUNTER — Other Ambulatory Visit: Payer: Self-pay | Admitting: Oncology

## 2022-04-07 ENCOUNTER — Telehealth: Payer: Self-pay

## 2022-04-07 NOTE — Telephone Encounter (Signed)
Pt scheduled for f/u with Dr. Bryan Lemma on 7/18 at 9:00 am. Pt verbalized understanding and had no other concerns at end of call.

## 2022-04-07 NOTE — Telephone Encounter (Signed)
Attempted to contact pt to schedule f/u appt as requested on 6/1 colonoscopy report. Left message for pt to call back.

## 2022-05-06 ENCOUNTER — Other Ambulatory Visit: Payer: Self-pay | Admitting: *Deleted

## 2022-05-06 MED ORDER — TAMOXIFEN CITRATE 20 MG PO TABS: 20.0000 mg | ORAL_TABLET | Freq: Every day | ORAL | 1 refills | Status: DC

## 2022-05-17 ENCOUNTER — Ambulatory Visit (INDEPENDENT_AMBULATORY_CARE_PROVIDER_SITE_OTHER): Payer: Medicare HMO | Admitting: Gastroenterology

## 2022-05-17 ENCOUNTER — Encounter: Payer: Self-pay | Admitting: Gastroenterology

## 2022-05-17 VITALS — BP 134/70 | HR 82 | Ht 62.0 in | Wt 123.1 lb

## 2022-05-17 DIAGNOSIS — K5900 Constipation, unspecified: Secondary | ICD-10-CM

## 2022-05-17 DIAGNOSIS — K449 Diaphragmatic hernia without obstruction or gangrene: Secondary | ICD-10-CM | POA: Diagnosis not present

## 2022-05-17 DIAGNOSIS — K219 Gastro-esophageal reflux disease without esophagitis: Secondary | ICD-10-CM

## 2022-05-17 DIAGNOSIS — K5730 Diverticulosis of large intestine without perforation or abscess without bleeding: Secondary | ICD-10-CM

## 2022-05-17 MED ORDER — RABEPRAZOLE SODIUM 20 MG PO TBEC: 20.0000 mg | DELAYED_RELEASE_TABLET | Freq: Two times a day (BID) | ORAL | 1 refills | Status: DC

## 2022-05-17 NOTE — Progress Notes (Signed)
Chief Complaint:    Procedure follow-up, GERD  GI History: 76 year old female with a history of breast cancer s/p left mastectomy and adjuvant chemotherapy, now tamoxifen, hyperlipidemia, hypothyroidism, fibromyalgia, initially referred to the GI clinic in 08/2021 for evaluation of reflux symptoms.  Has a long-standing hx of GERD, but increasing heartburn in late 2022. Has a metallic taste as well (which she attributes to chemotherapy).  Reflux symptoms tend to be post prandial, w/o identified trigger foods. Takes omeprazole 40 mg QAM for many years, but more recently started taking Tums 2-3 times/day. Occasional dysphagia to liquids > solids ("it sticks and I have to cough it up"), but not hx of food impactions.  EGD many years ago at [facility] and no report in EMR for review, but she was told she has Barrett's Esophagus.    Brother recently died with Esophageal and Stomach CA.    Endoscopic History: - Colonoscopy (11/2015): Inadequate prep.  7 mm polyp removed from 5 cm into the rectum (path benign).  Recommended repeat in 3 years due to inadequate prep. - Colonoscopy (05/2019): 6 mm cecal polyp (path: Tubular adenoma), transverse/sigmoid diverticulosis, moderately tortuous.  Repeat in 7 years (vs no repeat due to age and patient preference) - EGD (12/2021): Mild stricture lower esophagus dilated with 18 mm TTS balloon.  5 cm HH, mild non-H. pylori gastritis - Colonoscopy (02/17/2022): Poor prep, procedure aborted in the sigmoid colon.  Sigmoid diverticulosis - Colonoscopy (03/31/2022): 5 mm transverse colon HP, sigmoid diverticulosis, significantly tortuous sigmoid colon, looping in ascending colon.  Otherwise no mass, ulceration, stricture, obstruction, or other worrisome feature.  No repeat colonoscopy due to age  Recent GI imaging: -11/11/2021: CT abdomen/pelvis: Focal wall thickening of the sigmoid colon without adjacent inflammatory change.  No evidence of obstruction. -01/13/2022: CT  abdomen/pelvis: Moderate size hiatal hernia, large volume stool in colon, redundant sigmoid/descending colon.  Short 2 cm focus of stricturing in the sigmoid colon.  No stool distal to the narrowing.  2 small left hepatic lobe cysts, unchanged from prior.  HPI:     Patient is a 76 y.o. female presenting to the Gastroenterology Clinic for follow-up.  Last seen by me in the office on 02/14/2022.  Reflux symptoms well controlled with Aciphex.  Main issue at that time was abnormal CT findings in January and March 2023 as above.  Elected to proceed with colonoscopy which was notable for sigmoid diverticulosis with significant tortuosity, but otherwise no mass or other worrisome features.  She presents today to follow-up on that colonoscopy and discuss results.  Additionally, started having nocturnal regurgitation and HB again.  Currently taking Protonix 40 mg/day (not sure why/when she changed from Aciphex back to Protonix). Using Tums daily. Can also have HB during the daytime. Sleeps with HOB elevated and avoids eating within 3 hours of bedtime.   She does continue to have occasional hard stools.  No hematochezia or melena.  Review of systems:     No chest pain, no SOB, no fevers, no urinary sx   Past Medical History:  Diagnosis Date   Anxiety    Breast cancer ([place]) 01/2021   left breast IDC   Complication of anesthesia    Family history of breast cancer 12/16/2020   Family history of gastric cancer 12/16/2020   Fibromyalgia    GERD (gastroesophageal reflux disease)    Hyperlipidemia    Hypothyroidism    Osteoarthritis    knees, hands, fingers   Osteoarthritis    PONV (postoperative nausea and  vomiting)    Skin cancer     Patient's surgical history, family medical history, social history, medications and allergies were all reviewed in Epic    Current Outpatient Medications  Medication Sig Dispense Refill   cycloSPORINE (RESTASIS) 0.05 % ophthalmic emulsion Place 1 drop into both eyes  2 (two) times daily. 0.4 mL 1   dicyclomine (BENTYL) 10 MG capsule Take 1 capsule (10 mg total) by mouth every 6 (six) hours as needed for spasms. 60 capsule 3   DULoxetine (CYMBALTA) 60 MG capsule Take 1 capsule (60 mg total) by mouth 2 (two) times daily. 180 capsule 2   gabapentin (NEURONTIN) 300 MG capsule Take 1 capsule (300 mg total) by mouth 3 (three) times daily. 270 capsule 1   levothyroxine (SYNTHROID) 50 MCG tablet Take 1 tablet (50 mcg total) by mouth daily before breakfast. 90 tablet 2   methocarbamol (ROBAXIN) 500 MG tablet Take 1 tablet (500 mg total) by mouth every 6 (six) hours as needed for muscle spasms. 20 tablet 0   pantoprazole (PROTONIX) 40 MG tablet Take 40 mg by mouth daily.     simvastatin (ZOCOR) 40 MG tablet Take 1 tablet (40 mg total) by mouth daily. 90 tablet 2   tamoxifen (NOLVADEX) 20 MG tablet Take 1 tablet (20 mg total) by mouth daily. 90 tablet 1   traMADol (ULTRAM) 50 MG tablet tramadol 50 mg tablet  TAKE 1 TABLET BY MOUTH EVERY 6 HOURS AS NEEDED     No current facility-administered medications for this visit.    Physical Exam:     BP 134/70   Pulse 82   Ht '5\' 2"'$  (1.575 m)   Wt 123 lb 2 oz (55.8 kg)   BMI 22.52 kg/m   GENERAL:  Pleasant female in NAD PSYCH: : Cooperative, normal affect Musculoskeletal:  Normal muscle tone, normal strength NEURO: Alert and oriented x 3, no focal neurologic deficits   IMPRESSION and PLAN:    1) GERD 2) Hiatal hernia Discussed continued reflux symptoms and pathophysiology of GERD and hiatal hernia at length today.  She is interested in antireflux surgical options.  Plan to proceed as below:  - Start Aciphex 20 mg BID. If not approved, will trial Dexilant - Can stop Protonix when she starts the Aciphex - Continue antireflux lifestyle/dietary modifications - Barium esophagram to evaluate hernia size along with grade/severity of refluxate and and as part preoperative assessment - Referral to Dr. Redmond Pulling at [facility] for  consideration of concomitant laparoscopic hiatal hernia repair and TIF -Follow-up with me after esophagram and surgical evaluation  3) Constipation 4) Diverticulosis - Start MiraLAX and titrate to soft stools/straining to BM - Continue adequate hydration with 64 ounces of water/day  I spent 30 minutes of time, including in depth chart review, independent review of results as outlined above, communicating results with the patient directly, face-to-face time with the patient, coordinating care, and ordering studies and medications as appropriate, and documentation.   [place] ,DO, FACG 05/17/2022, 9:15 AM

## 2022-05-17 NOTE — Patient Instructions (Addendum)
If you are age 76 or older, your body mass index should be between 23-30. Your Body mass index is 22.52 kg/m. If this is out of the aforementioned range listed, please consider follow up with your Primary Care Provider.  ________________________________________________________  The [facility] providers would like to encourage you to use [facility] to communicate with providers for non-urgent requests or questions.  Due to long hold times on the telephone, sending your provider a message by [facility] may be a faster and more efficient way to get a response.  Please allow 48 business hours for a response.  Please remember that this is for non-urgent requests.  _______________________________________________________  Due to recent changes in healthcare laws, you may see the results of your imaging and laboratory studies on MyChart before your provider has had a chance to review them.  We understand that in some cases there may be results that are confusing or concerning to you. Not all laboratory results come back in the same time frame and the provider may be waiting for multiple results in order to interpret others.  Please give [place] 48 hours in order for your provider to thoroughly review all the results before contacting the office for clarification of your results.   We have sent the following medications to your pharmacy for you to pick up at your convenience: Aciphex.  You will be contacted by [facility]. It is located at [address] [place], [employer] and exercise psychologist # [phone].    You have been scheduled for a Barium Esophogram at [facility] (1st floor of the hospital) on 05/31/22 at 1100am . Please arrive 30  minutes prior to your appointment for registration. Make certain not to have anything to eat or drink 3 hours prior to your test. If you need to reschedule for any reason, please contact radiology at [phone] to do  so. __________________________________________________________________ A barium swallow is an examination that concentrates on views of the esophagus. This tends to be a double contrast exam (barium and two liquids which, when combined, create a gas to distend the wall of the oesophagus) or single contrast (non-ionic iodine based). The study is usually tailored to your symptoms so a good history is essential. Attention is paid during the study to the form, structure and configuration of the esophagus, looking for functional disorders (such as aspiration, dysphagia, achalasia, motility and reflux) EXAMINATION You may be asked to change into a gown, depending on the type of swallow being performed. A radiologist and radiographer will perform the procedure. The radiologist will advise you of the type of contrast selected for your procedure and direct you during the exam. You will be asked to stand, sit or lie in several different positions and to hold a small amount of fluid in your mouth before being asked to swallow while the imaging is performed .In some instances you may be asked to swallow barium coated marshmallows to assess the motility of a solid food bolus. The exam can be recorded as a digital or video fluoroscopy procedure. POST PROCEDURE It will take 1-2 days for the barium to pass through your system. To facilitate this, it is important, unless otherwise directed, to increase your fluids for the next 24-48hrs and to resume your normal diet.  This test typically takes about 30 minutes to perform. __________________________________________________________________________________    Thank you for choosing me and [facility].  Vito Cirigliano, D.O.

## 2022-05-20 ENCOUNTER — Telehealth: Payer: Self-pay | Admitting: Hematology and Oncology

## 2022-05-20 DIAGNOSIS — Z170 Estrogen receptor positive status [ER+]: Secondary | ICD-10-CM | POA: Diagnosis not present

## 2022-05-20 DIAGNOSIS — C50412 Malignant neoplasm of upper-outer quadrant of left female breast: Secondary | ICD-10-CM | POA: Diagnosis not present

## 2022-05-20 NOTE — Telephone Encounter (Signed)
Scheduled per 07/21 scheduled message, patient has been called and voicemail was left.

## 2022-05-30 ENCOUNTER — Ambulatory Visit (HOSPITAL_COMMUNITY)
Admission: RE | Admit: 2022-05-30 | Discharge: 2022-05-30 | Disposition: A | Discharge status: Home / Self Care | Payer: Medicare HMO | Source: Ambulatory Visit | Attending: Gastroenterology | Admitting: Gastroenterology

## 2022-05-30 DIAGNOSIS — K449 Diaphragmatic hernia without obstruction or gangrene: Secondary | ICD-10-CM | POA: Diagnosis not present

## 2022-05-30 DIAGNOSIS — K219 Gastro-esophageal reflux disease without esophagitis: Secondary | ICD-10-CM | POA: Diagnosis not present

## 2022-05-30 DIAGNOSIS — K5730 Diverticulosis of large intestine without perforation or abscess without bleeding: Secondary | ICD-10-CM | POA: Insufficient documentation

## 2022-05-31 ENCOUNTER — Ambulatory Visit (HOSPITAL_COMMUNITY): Payer: Medicare HMO

## 2022-06-01 ENCOUNTER — Telehealth: Payer: Self-pay | Admitting: Gastroenterology

## 2022-06-01 NOTE — Telephone Encounter (Signed)
Patient returned your call, please advise. 

## 2022-06-01 NOTE — Telephone Encounter (Signed)
See 7/31 esophagram result notes.

## 2022-06-02 ENCOUNTER — Other Ambulatory Visit: Payer: Self-pay

## 2022-06-02 DIAGNOSIS — K219 Gastro-esophageal reflux disease without esophagitis: Secondary | ICD-10-CM

## 2022-06-02 DIAGNOSIS — K449 Diaphragmatic hernia without obstruction or gangrene: Secondary | ICD-10-CM

## 2022-06-02 DIAGNOSIS — R1310 Dysphagia, unspecified: Secondary | ICD-10-CM

## 2022-06-02 NOTE — Telephone Encounter (Signed)
Patient returned your call.

## 2022-06-02 NOTE — Telephone Encounter (Signed)
Spoke with pt see 7/31 esophagram result note.

## 2022-06-09 DIAGNOSIS — Z9889 Other specified postprocedural states: Secondary | ICD-10-CM | POA: Diagnosis not present

## 2022-06-09 DIAGNOSIS — C50412 Malignant neoplasm of upper-outer quadrant of left female breast: Secondary | ICD-10-CM | POA: Diagnosis not present

## 2022-06-09 DIAGNOSIS — Z170 Estrogen receptor positive status [ER+]: Secondary | ICD-10-CM | POA: Diagnosis not present

## 2022-06-09 DIAGNOSIS — K449 Diaphragmatic hernia without obstruction or gangrene: Secondary | ICD-10-CM | POA: Diagnosis not present

## 2022-06-09 DIAGNOSIS — K219 Gastro-esophageal reflux disease without esophagitis: Secondary | ICD-10-CM | POA: Diagnosis not present

## 2022-06-22 ENCOUNTER — Ambulatory Visit: Payer: Medicare HMO | Admitting: Hematology and Oncology

## 2022-06-22 DIAGNOSIS — I87393 Chronic venous hypertension (idiopathic) with other complications of bilateral lower extremity: Secondary | ICD-10-CM | POA: Diagnosis not present

## 2022-06-22 DIAGNOSIS — M79662 Pain in left lower leg: Secondary | ICD-10-CM | POA: Diagnosis not present

## 2022-06-22 DIAGNOSIS — R600 Localized edema: Secondary | ICD-10-CM | POA: Diagnosis not present

## 2022-06-22 DIAGNOSIS — R252 Cramp and spasm: Secondary | ICD-10-CM | POA: Diagnosis not present

## 2022-06-22 DIAGNOSIS — M79661 Pain in right lower leg: Secondary | ICD-10-CM | POA: Diagnosis not present

## 2022-06-23 DIAGNOSIS — C50912 Malignant neoplasm of unspecified site of left female breast: Secondary | ICD-10-CM | POA: Diagnosis not present

## 2022-07-05 ENCOUNTER — Other Ambulatory Visit: Payer: Self-pay | Admitting: Family Medicine

## 2022-07-05 DIAGNOSIS — M797 Fibromyalgia: Secondary | ICD-10-CM

## 2022-07-05 MED ORDER — DULOXETINE HCL 60 MG PO CPEP: 60.0000 mg | ORAL_CAPSULE | Freq: Two times a day (BID) | ORAL | 2 refills | Status: DC

## 2022-07-11 DIAGNOSIS — Z4789 Encounter for other orthopedic aftercare: Secondary | ICD-10-CM | POA: Diagnosis not present

## 2022-07-11 DIAGNOSIS — M13849 Other specified arthritis, unspecified hand: Secondary | ICD-10-CM | POA: Diagnosis not present

## 2022-07-11 DIAGNOSIS — G5601 Carpal tunnel syndrome, right upper limb: Secondary | ICD-10-CM | POA: Diagnosis not present

## 2022-07-11 DIAGNOSIS — M13841 Other specified arthritis, right hand: Secondary | ICD-10-CM | POA: Diagnosis not present

## 2022-07-13 ENCOUNTER — Encounter (HOSPITAL_COMMUNITY): Payer: Self-pay | Admitting: Gastroenterology

## 2022-07-13 NOTE — Progress Notes (Signed)
Attempted to obtain medical history via telephone, unable to reach at this time. HIPAA compliant voicemail message left requesting return call to pre surgical testing department. 

## 2022-07-20 ENCOUNTER — Encounter (HOSPITAL_COMMUNITY)
Admission: RE | Disposition: A | Discharge status: Home / Self Care | Payer: Self-pay | Source: Home / Self Care | Attending: Gastroenterology

## 2022-07-20 ENCOUNTER — Encounter (HOSPITAL_COMMUNITY): Payer: Self-pay | Admitting: Gastroenterology

## 2022-07-20 ENCOUNTER — Ambulatory Visit (HOSPITAL_COMMUNITY)
Admission: RE | Admit: 2022-07-20 | Discharge: 2022-07-20 | Disposition: A | Discharge status: Home / Self Care | Payer: Medicare HMO | Attending: Gastroenterology | Admitting: Gastroenterology

## 2022-07-20 DIAGNOSIS — K279 Peptic ulcer, site unspecified, unspecified as acute or chronic, without hemorrhage or perforation: Secondary | ICD-10-CM | POA: Insufficient documentation

## 2022-07-20 DIAGNOSIS — K219 Gastro-esophageal reflux disease without esophagitis: Secondary | ICD-10-CM

## 2022-07-20 DIAGNOSIS — M170 Bilateral primary osteoarthritis of knee: Secondary | ICD-10-CM | POA: Diagnosis not present

## 2022-07-20 DIAGNOSIS — R1310 Dysphagia, unspecified: Secondary | ICD-10-CM

## 2022-07-20 DIAGNOSIS — M19041 Primary osteoarthritis, right hand: Secondary | ICD-10-CM | POA: Diagnosis not present

## 2022-07-20 DIAGNOSIS — M797 Fibromyalgia: Secondary | ICD-10-CM | POA: Diagnosis not present

## 2022-07-20 DIAGNOSIS — E039 Hypothyroidism, unspecified: Secondary | ICD-10-CM | POA: Insufficient documentation

## 2022-07-20 DIAGNOSIS — M19042 Primary osteoarthritis, left hand: Secondary | ICD-10-CM | POA: Diagnosis not present

## 2022-07-20 DIAGNOSIS — M47812 Spondylosis without myelopathy or radiculopathy, cervical region: Secondary | ICD-10-CM | POA: Diagnosis not present

## 2022-07-20 HISTORY — PX: ESOPHAGEAL MANOMETRY: SHX5429

## 2022-07-20 SURGERY — MANOMETRY, ESOPHAGUS
Anesthesia: Choice

## 2022-07-20 MED ORDER — LIDOCAINE VISCOUS HCL 2 % MT SOLN
OROMUCOSAL | Status: AC
  Filled 2022-07-20: qty 15

## 2022-07-20 SURGICAL SUPPLY — 2 items
FACESHIELD LNG OPTICON STERILE (SAFETY) IMPLANT
GLOVE BIO SURGEON STRL SZ8 (GLOVE) ×2 IMPLANT

## 2022-07-20 NOTE — Progress Notes (Signed)
Esophageal manometry performed per protocol without complications.  Patient tolerated well. 

## 2022-07-30 DIAGNOSIS — K219 Gastro-esophageal reflux disease without esophagitis: Secondary | ICD-10-CM

## 2022-07-30 DIAGNOSIS — R1310 Dysphagia, unspecified: Secondary | ICD-10-CM

## 2022-08-10 ENCOUNTER — Ambulatory Visit: Payer: Medicare HMO | Admitting: Gastroenterology

## 2022-08-12 ENCOUNTER — Telehealth: Payer: Self-pay | Admitting: Gastroenterology

## 2022-08-12 NOTE — Telephone Encounter (Signed)
Patient called, states she is returning your call. Please call to advise.

## 2022-08-12 NOTE — Telephone Encounter (Signed)
Left message for pt to call back  °

## 2022-08-15 NOTE — Telephone Encounter (Signed)
[facility] message sent to pt about EM results.

## 2022-08-18 ENCOUNTER — Ambulatory Visit: Payer: Medicare HMO | Admitting: Gastroenterology

## 2022-09-08 DIAGNOSIS — M13849 Other specified arthritis, unspecified hand: Secondary | ICD-10-CM | POA: Diagnosis not present

## 2022-09-08 DIAGNOSIS — M13841 Other specified arthritis, right hand: Secondary | ICD-10-CM | POA: Diagnosis not present

## 2022-09-08 DIAGNOSIS — Z4789 Encounter for other orthopedic aftercare: Secondary | ICD-10-CM | POA: Diagnosis not present

## 2022-09-08 DIAGNOSIS — G5601 Carpal tunnel syndrome, right upper limb: Secondary | ICD-10-CM | POA: Diagnosis not present

## 2022-09-08 DIAGNOSIS — M1811 Unilateral primary osteoarthritis of first carpometacarpal joint, right hand: Secondary | ICD-10-CM | POA: Diagnosis not present

## 2022-09-09 ENCOUNTER — Telehealth: Payer: Self-pay

## 2022-09-09 NOTE — Telephone Encounter (Signed)
Nurse Assessment Nurse: D'Heur Lucia Gaskins, RN, Adrienne Date/Time (Eastern Time): 09/09/2022 11:06:24 AM Confirm and document reason for call. If symptomatic, describe symptoms. ---Caller states that she needs to be seen for her blood pressure has ran as high as 200/102 (on Monday). It is currently 160/93, she has a headache, and she is nauseous. This has been going on for a few weeks. She does not take BP medicines. She has been very stressed lately. Does the patient have any new or worsening symptoms? ---Yes Will a triage be completed? ---Yes Related visit to physician within the last 2 weeks? ---No Does the PT have any chronic conditions? (i.e. diabetes, asthma, this includes High risk factors for pregnancy, etc.) ---Yes List chronic conditions. ---Hx of breast cancer, hypothyroid, high cholesterol, fibromyalgia Is this a behavioral health or substance abuse call? ---No Guidelines Guideline Title Affirmed Question Affirmed Notes Nurse Date/Time (Eastern Time) Blood Pressure - High [5] Systolic BP >= 397 OR Diastolic >= 80 AND [6] not taking BP medications D'Heur Lucia Gaskins, RN, Vincente Liberty 09/09/2022 11:10:38 AM PLEASE NOTE: All timestamps contained within this report are represented as [place] Standard Time. CONFIDENTIALTY NOTICE: This fax transmission is intended only for the addressee. It contains information that is legally privileged, confidential or otherwise protected from use or disclosure. If you are not the intended recipient, you are strictly prohibited from reviewing, disclosing, copying using or disseminating any of this information or taking any action in reliance on or regarding this information. If you have received this fax in error, please notify us immediately by telephone so that we can arrange for its return to [place]. Phone: [phone], Toll-Free: [phone], Fax: [phone] Page: 2 of 2 Call Id: [id number] [place]. Time Eilene Ghazi Time) Disposition Final  User 09/09/2022 11:04:59 AM Send to Urgent Queue Windy Canny 09/09/2022 11:17:20 AM See PCP within 2 Weeks Yes D'Heur Lucia Gaskins, RN, Adrienne Final Disposition 09/09/2022 11:17:20 AM See PCP within 2 Weeks Yes [place], RN, Vincente Liberty Caller Disagree/Comply Comply Caller Understands Yes PreDisposition Call Doctor Care Advice Given Per Guideline SEE PCP WITHIN 2 WEEKS: * You need to be seen for this ongoing problem within the next 2 weeks. HIGH BLOOD PRESSURE - LIFESTYLE MODIFICATIONS: * The following things can help you reduce your blood pressure. * EAT HEALTHY: Eat a diet rich in fresh fruits and vegetables, dietary fiber, non-animal protein (e.g., soy), and low-fat dairy products. Avoid foods with a high content of saturated fat or cholesterol. * LIMIT ALCOHOL: Limit alcoholic beverages to no more than one drink per day for women and no more than two drinks for men. A drink is 1.5 oz hard liquor (one shot or jigger; 45 ml), 5 oz wine (small glass; 150 ml), 12 oz beer (one can; 360 ml). * EXERCISE, BE MORE PHYSICALLY ACTIVE: Do at least 30 minutes of aerobic exercise (e.g., brisk walking) most days of the week. Other examples of aerobic activities cycling, jogging, and swimming. * REDUCE STRESS: Find activities that help reduce your stress. Examples might include meditation, yoga, or even a restful walk in a park. CALL BACK IF: * Weakness or numbness of the face, arm or leg on one side of the body occurs * Difficulty walking, difficulty talking, or severe headache occurs * Chest pain or difficulty breathing occurs * Your blood pressure is over 160/100 * You become worse CARE ADVICE given per High Blood Pressure (Adult) guideline. Comments User: Vincente Liberty, D'Heur Lucia Gaskins, RN Date/Time Eilene Ghazi Time): 09/09/2022 11:12:24 AM Current BP is 154/95 , pulse is 79

## 2022-09-09 NOTE — Telephone Encounter (Signed)
Appt scheduled 09/12/22.

## 2022-09-12 ENCOUNTER — Ambulatory Visit (INDEPENDENT_AMBULATORY_CARE_PROVIDER_SITE_OTHER): Payer: Medicare HMO | Admitting: Family Medicine

## 2022-09-12 ENCOUNTER — Encounter: Payer: Self-pay | Admitting: Family Medicine

## 2022-09-12 VITALS — BP 120/82 | HR 87 | Temp 98.4°F | Ht 60.0 in | Wt 120.1 lb

## 2022-09-12 DIAGNOSIS — E785 Hyperlipidemia, unspecified: Secondary | ICD-10-CM | POA: Diagnosis not present

## 2022-09-12 DIAGNOSIS — R6889 Other general symptoms and signs: Secondary | ICD-10-CM

## 2022-09-12 DIAGNOSIS — Z23 Encounter for immunization: Secondary | ICD-10-CM

## 2022-09-12 DIAGNOSIS — F411 Generalized anxiety disorder: Secondary | ICD-10-CM

## 2022-09-12 DIAGNOSIS — E039 Hypothyroidism, unspecified: Secondary | ICD-10-CM

## 2022-09-12 LAB — CBC
HCT: 41.7 % (ref 36.0–46.0)
Hemoglobin: 13.8 g/dL (ref 12.0–15.0)
MCHC: 33.2 g/dL (ref 30.0–36.0)
MCV: 95.6 fl (ref 78.0–100.0)
Platelets: 256 10*3/uL (ref 150.0–400.0)
RBC: 4.36 Mil/uL (ref 3.87–5.11)
RDW: 13.9 % (ref 11.5–15.5)
WBC: 7.2 10*3/uL (ref 4.0–10.5)

## 2022-09-12 LAB — COMPREHENSIVE METABOLIC PANEL
ALT: 8 U/L (ref 0–35)
AST: 12 U/L (ref 0–37)
Albumin: 3.7 g/dL (ref 3.5–5.2)
Alkaline Phosphatase: 68 U/L (ref 39–117)
BUN: 12 mg/dL (ref 6–23)
CO2: 29 mEq/L (ref 19–32)
Calcium: 8.5 mg/dL (ref 8.4–10.5)
Chloride: 104 mEq/L (ref 96–112)
Creatinine, Ser: 0.68 mg/dL (ref 0.40–1.20)
GFR: 84.47 mL/min (ref >60.00)
Glucose, Bld: 162 mg/dL — ABNORMAL HIGH (ref 70–99)
Potassium: 4.5 mEq/L (ref 3.5–5.1)
Sodium: 141 mEq/L (ref 135–145)
Total Bilirubin: 0.3 mg/dL (ref 0.2–1.2)
Total Protein: 6.4 g/dL (ref 6.0–8.3)

## 2022-09-12 LAB — T4, FREE: Free T4: 0.82 ng/dL (ref 0.60–1.60)

## 2022-09-12 LAB — LIPID PANEL
Cholesterol: 179 mg/dL (ref 0–200)
HDL: 48 mg/dL (ref >39.00)
NonHDL: 130.66
Total CHOL/HDL Ratio: 4
Triglycerides: 314 mg/dL — ABNORMAL HIGH (ref 0.0–149.0)
VLDL: 62.8 mg/dL — ABNORMAL HIGH (ref 0.0–40.0)

## 2022-09-12 LAB — TSH: TSH: 2.06 u[IU]/mL (ref 0.35–5.50)

## 2022-09-12 MED ORDER — BUSPIRONE HCL 7.5 MG PO TABS: 7.5000 mg | ORAL_TABLET | Freq: Two times a day (BID) | ORAL | 2 refills | Status: DC

## 2022-09-12 NOTE — Progress Notes (Signed)
Chief Complaint  Patient presents with   Hypertension   Stress    Headaches (for 5 days) Nausea    Insomnia    Check thyroid     Subjective Gina Hunt is a 76 y.o. female who presents for elevated blood pressure follow up.  She is here with her friend. She does monitor home blood pressures. Blood pressures ranging from 130-150's/80-90's on average. She is not taking any medication. She is adhering to a healthy diet overall. Current exercise: Walking No chest pain or shortness of breath.  She has a history of hypothyroidism.  She did not take her medication for 3 weeks and just started this past week.  She is normally on levothyroxine 50 mcg daily.  She has been having cold intolerance, fatigue, poor sleep, and some nausea.  She has been going through more stress lately as well.  Her brother-in-law recently passed away and she was caring for him in the last several weeks of his life.  She is compliant with Cymbalta 60 mg daily but is having more pain in her neck/shoulder area.   Past Medical History:  Diagnosis Date   Anxiety    Breast cancer ([place]) 01/2021   left breast IDC   Complication of anesthesia    Family history of breast cancer 12/16/2020   Family history of gastric cancer 12/16/2020   Fibromyalgia    GERD (gastroesophageal reflux disease)    Hyperlipidemia    Hypothyroidism    Osteoarthritis    knees, hands, fingers   Osteoarthritis    PONV (postoperative nausea and vomiting)    Skin cancer     Exam BP 120/82 (BP Location: Left Arm, Patient Position: Sitting, Cuff Size: Normal)   Pulse 87   Temp 98.4 F (36.9 C) (Oral)   Ht 5' (1.524 m)   Wt 120 lb 2 oz (54.5 kg)   SpO2 96%   BMI 23.46 kg/m  General:  well developed, well nourished, in no apparent distress Heart: RRR, no bruits, no LE edema Lungs: clear to auscultation, no accessory muscle use Neuro: DTRs equal and symmetric throughout, no clonus, no cerebellar signs, gait is normal, 5/5 strength  throughout MSK: TTP over the cervical paraspinal musculature in addition to the trapezius bilaterally with some hypertonicity Psych: well oriented with normal range of affect and appropriate judgment/insight  Hyperlipidemia, unspecified hyperlipidemia type - Plan: Comprehensive metabolic panel, Lipid panel  Hypothyroidism, unspecified type - Plan: T4, free, TSH  Cold intolerance - Plan: CBC  GAD (generalized anxiety disorder) - Plan: busPIRone (BUSPAR) 7.5 MG tablet  Need for vaccination against Streptococcus pneumoniae - Plan: Pneumococcal conjugate vaccine 20-valent (Prevnar 20)  Check labs.  Counseled on diet and exercise. Check labs, continue levothyroxine 50 mcg daily, if very low will consider increasing to 75 mcg and rechecking in 6 weeks. Check a CBC today. Chronic, uncontrolled.  Start BuSpar 7.5 mg twice daily.  Consider seeing a therapist.  Continue Cymbalta 60 mg daily.  Follow-up in 1 month. The patient and her friend voiced understanding and agreement to the plan.  [place], DO 09/12/22  3:41 PM

## 2022-09-12 NOTE — Patient Instructions (Addendum)
Heat (pad or rice pillow in microwave) over affected area, 10-15 minutes twice daily.   Ice/cold pack over area for 10-15 min twice daily.  Give [place] 2-3 business days to get the results of your labs back.   Keep the diet clean and stay active.  Check your blood pressures 2-3 times per week, alternating the time of day you check it. If it is high, considering waiting 1-2 minutes and rechecking. If it gets higher, your anxiety is likely creeping up and we should avoid rechecking. Bring your monitor to your next nurse visit.   Let us know if you need anything.  Trapezius stretches/exercises Do exercises exactly as told by your health care provider and adjust them as directed. It is normal to feel mild stretching, pulling, tightness, or discomfort as you do these exercises, but you should stop right away if you feel sudden pain or your pain gets worse.   Stretching and range of motion exercises These exercises warm up your muscles and joints and improve the movement and flexibility of your shoulder. These exercises can also help to relieve pain, numbness, and tingling. If you are unable to do any of the following for any reason, do not further attempt to do it.   Exercise A: Flexion, standing     Stand and hold a broomstick, a cane, or a similar object. Place your hands a little more than shoulder-width apart on the object. Your left / right hand should be palm-up, and your other hand should be palm-down. Push the stick to raise your left / right arm out to your side and then over your head. Use your other hand to help move the stick. Stop when you feel a stretch in your shoulder, or when you reach the angle that is recommended by your health care provider. Avoid shrugging your shoulder while you raise your arm. Keep your shoulder blade tucked down toward your spine. Hold for 30 seconds. Slowly return to the starting position. Repeat 2 times. Complete this exercise 3 times per week.  Exercise  B: Abduction, supine     Lie on your back and hold a broomstick, a cane, or a similar object. Place your hands a little more than shoulder-width apart on the object. Your left / right hand should be palm-up, and your other hand should be palm-down. Push the stick to raise your left / right arm out to your side and then over your head. Use your other hand to help move the stick. Stop when you feel a stretch in your shoulder, or when you reach the angle that is recommended by your health care provider. Avoid shrugging your shoulder while you raise your arm. Keep your shoulder blade tucked down toward your spine. Hold for 30 seconds. Slowly return to the starting position. Repeat 2 times. Complete this exercise 3 times per week.  Exercise C: Flexion, active-assisted     Lie on your back. You may bend your knees for comfort. Hold a broomstick, a cane, or a similar object. Place your hands about shoulder-width apart on the object. Your palms should face toward your feet. Raise the stick and move your arms over your head and behind your head, toward the floor. Use your healthy arm to help your left / right arm move farther. Stop when you feel a gentle stretch in your shoulder, or when you reach the angle where your health care provider tells you to stop. Hold for 30 seconds. Slowly return to the starting position.  Repeat 2 times. Complete this exercise 3 times per week.  Exercise D: External rotation and abduction     Stand in a door frame with one of your feet slightly in front of the other. This is called a staggered stance. Choose one of the following positions as told by your health care provider: Place your hands and forearms on the door frame above your head. Place your hands and forearms on the door frame at the height of your head. Place your hands on the door frame at the height of your elbows. Slowly move your weight onto your front foot until you feel a stretch across your chest and  in the front of your shoulders. Keep your head and chest upright and keep your abdominal muscles tight. Hold for 30 seconds. To release the stretch, shift your weight to your back foot. Repeat 2 times. Complete this stretch 3 times per week.  Strengthening exercises These exercises build strength and endurance in your shoulder. Endurance is the ability to use your muscles for a long time, even after your muscles get tired. Exercise E: Scapular depression and adduction  Sit on a stable chair. Support your arms in front of you with pillows, armrests, or a tabletop. Keep your elbows in line with the sides of your body. Gently move your shoulder blades down toward your middle back. Relax the muscles on the tops of your shoulders and in the back of your neck. Hold for 3 seconds. Slowly release the tension and relax your muscles completely before doing this exercise again. Repeat for a total of 10 repetitions. After you have practiced this exercise, try doing the exercise without the arm support. Then, try the exercise while standing instead of sitting. Repeat 2 times. Complete this exercise 3 times per week.  Exercise F: Shoulder abduction, isometric     Stand or sit about 4-6 inches (10-15 cm) from a wall with your left / right side facing the wall. Bend your left / right elbow and gently press your elbow against the wall. Increase the pressure slowly until you are pressing as hard as you can without shrugging your shoulder. Hold for 3 seconds. Slowly release the tension and relax your muscles completely. Repeat for a total of 10 repetitions. Repeat 2 times. Complete this exercise 3 times per week.  Exercise G: Shoulder flexion, isometric     Stand or sit about 4-6 inches (10-15 cm) away from a wall with your left / right side facing the wall. Keep your left / right elbow straight and gently press the top of your fist against the wall. Increase the pressure slowly until you are pressing as  hard as you can without shrugging your shoulder. Hold for 10-15 seconds. Slowly release the tension and relax your muscles completely. Repeat for a total of 10 repetitions. Repeat 2 times. Complete this exercise 3 times per week.  Exercise H: Internal rotation     Sit in a stable chair without armrests, or stand. Secure an exercise band at your left / right side, at elbow height. Place a soft object, such as a folded towel or a small pillow, under your left / right upper arm so your elbow is a few inches (about 8 cm) away from your side. Hold the end of the exercise band so the band stretches. Keeping your elbow pressed against the soft object under your arm, move your forearm across your body toward your abdomen. Keep your body steady so the movement is only  coming from your shoulder. Hold for 3 seconds. Slowly return to the starting position. Repeat for a total of 10 repetitions. Repeat 2 times. Complete this exercise 3 times per week.  Exercise I: External rotation     Sit in a stable chair without armrests, or stand. Secure an exercise band at your left / right side, at elbow height. Place a soft object, such as a folded towel or a small pillow, under your left / right upper arm so your elbow is a few inches (about 8 cm) away from your side. Hold the end of the exercise band so the band stretches. Keeping your elbow pressed against the soft object under your arm, move your forearm out, away from your abdomen. Keep your body steady so the movement is only coming from your shoulder. Hold for 3 seconds. Slowly return to the starting position. Repeat for a total of 10 repetitions. Repeat 2 times. Complete this exercise 3 times per week. Exercise J: Shoulder extension  Sit in a stable chair without armrests, or stand. Secure an exercise band to a stable object in front of you so the band is at shoulder height. Hold one end of the exercise band in each hand. Your palms should face each  other. Straighten your elbows and lift your hands up to shoulder height. Step back, away from the secured end of the exercise band, until the band stretches. Squeeze your shoulder blades together and pull your hands down to the sides of your thighs. Stop when your hands are straight down by your sides. Do not let your hands go behind your body. Hold for 3 seconds. Slowly return to the starting position. Repeat for a total of 10 repetitions. Repeat 2 times. Complete this exercise 3 times per week.  Exercise K: Shoulder extension, prone     Lie on your abdomen on a firm surface so your left / right arm hangs over the edge. Hold a 5 lb weight in your hand so your palm faces in toward your body. Your arm should be straight. Squeeze your shoulder blade down toward the middle of your back. Slowly raise your arm behind you, up to the height of the surface that you are lying on. Keep your arm straight. Hold for 3 seconds. Slowly return to the starting position and relax your muscles. Repeat for a total of 10 repetitions. Repeat 2 times. Complete this exercise 3 times per week.   Exercise L: Horizontal abduction, prone  Lie on your abdomen on a firm surface so your left / right arm hangs over the edge. Hold a 5 lb weight in your hand so your palm faces toward your feet. Your arm should be straight. Squeeze your shoulder blade down toward the middle of your back. Bend your elbow so your hand moves up, until your elbow is bent to an "L" shape (90 degrees). With your elbow bent, slowly move your forearm forward and up. Raise your hand up to the height of the surface that you are lying on. Your upper arm should not move, and your elbow should stay bent. At the top of the movement, your palm should face the floor. Hold for 3 seconds. Slowly return to the starting position and relax your muscles. Repeat for a total of 10 repetitions. Repeat 2 times. Complete this exercise 3 times per week.  Exercise  M: Horizontal abduction, standing  Sit on a stable chair, or stand. Secure an exercise band to a stable object in front of  you so the band is at shoulder height. Hold one end of the exercise band in each hand. Straighten your elbows and lift your hands straight in front of you, up to shoulder height. Your palms should face down, toward the floor. Step back, away from the secured end of the exercise band, until the band stretches. Move your arms out to your sides, and keep your arms straight. Hold for 3 seconds. Slowly return to the starting position. Repeat for a total of 10 repetitions. Repeat 2 times. Complete this exercise 3 times per week.  Exercise N: Scapular retraction and elevation  Sit on a stable chair, or stand. Secure an exercise band to a stable object in front of you so the band is at shoulder height. Hold one end of the exercise band in each hand. Your palms should face each other. Sit in a stable chair without armrests, or stand. Step back, away from the secured end of the exercise band, until the band stretches. Squeeze your shoulder blades together and lift your hands over your head. Keep your elbows straight. Hold for 3 seconds. Slowly return to the starting position. Repeat for a total of 10 repetitions. Repeat 2 times. Complete this exercise 3 times per week.  This information is not intended to replace advice given to you by your health care provider. Make sure you discuss any questions you have with your health care provider. Document Released: 10/17/2005 Document Revised: 06/23/2016 Document Reviewed: 09/03/2015 Elsevier Interactive Patient Education  2017 [employer].

## 2022-09-13 ENCOUNTER — Other Ambulatory Visit (INDEPENDENT_AMBULATORY_CARE_PROVIDER_SITE_OTHER): Payer: Medicare HMO

## 2022-09-13 ENCOUNTER — Other Ambulatory Visit: Payer: Self-pay | Admitting: Family Medicine

## 2022-09-13 DIAGNOSIS — R739 Hyperglycemia, unspecified: Secondary | ICD-10-CM | POA: Diagnosis not present

## 2022-09-13 LAB — HEMOGLOBIN A1C: Hgb A1c MFr Bld: 6.8 % — ABNORMAL HIGH (ref 4.6–6.5)

## 2022-09-13 LAB — LDL CHOLESTEROL, DIRECT: Direct LDL: 103 mg/dL

## 2022-09-16 DIAGNOSIS — Z1231 Encounter for screening mammogram for malignant neoplasm of breast: Secondary | ICD-10-CM | POA: Diagnosis not present

## 2022-09-16 LAB — HM MAMMOGRAPHY

## 2022-09-19 ENCOUNTER — Encounter: Payer: Self-pay | Admitting: Hematology and Oncology

## 2022-09-19 ENCOUNTER — Encounter: Payer: Self-pay | Admitting: Family Medicine

## 2022-09-28 ENCOUNTER — Ambulatory Visit: Payer: Medicare HMO | Admitting: Gastroenterology

## 2022-10-01 DIAGNOSIS — H524 Presbyopia: Secondary | ICD-10-CM | POA: Diagnosis not present

## 2022-10-01 DIAGNOSIS — H5213 Myopia, bilateral: Secondary | ICD-10-CM | POA: Diagnosis not present

## 2022-10-01 DIAGNOSIS — H5203 Hypermetropia, bilateral: Secondary | ICD-10-CM | POA: Diagnosis not present

## 2022-10-01 DIAGNOSIS — H52209 Unspecified astigmatism, unspecified eye: Secondary | ICD-10-CM | POA: Diagnosis not present

## 2022-10-02 ENCOUNTER — Other Ambulatory Visit: Payer: Self-pay | Admitting: Hematology and Oncology

## 2022-10-05 DIAGNOSIS — Z9889 Other specified postprocedural states: Secondary | ICD-10-CM | POA: Diagnosis not present

## 2022-10-05 DIAGNOSIS — K449 Diaphragmatic hernia without obstruction or gangrene: Secondary | ICD-10-CM | POA: Diagnosis not present

## 2022-10-05 DIAGNOSIS — K219 Gastro-esophageal reflux disease without esophagitis: Secondary | ICD-10-CM | POA: Diagnosis not present

## 2022-10-12 ENCOUNTER — Ambulatory Visit: Payer: Medicare HMO | Admitting: Family Medicine

## 2022-10-14 ENCOUNTER — Ambulatory Visit (INDEPENDENT_AMBULATORY_CARE_PROVIDER_SITE_OTHER): Payer: Medicare HMO | Admitting: Family Medicine

## 2022-10-14 ENCOUNTER — Encounter: Payer: Self-pay | Admitting: Family Medicine

## 2022-10-14 VITALS — BP 156/84 | HR 61 | Temp 98.0°F | Ht 60.0 in | Wt 121.0 lb

## 2022-10-14 DIAGNOSIS — I10 Essential (primary) hypertension: Secondary | ICD-10-CM | POA: Insufficient documentation

## 2022-10-14 DIAGNOSIS — E1165 Type 2 diabetes mellitus with hyperglycemia: Secondary | ICD-10-CM

## 2022-10-14 DIAGNOSIS — F338 Other recurrent depressive disorders: Secondary | ICD-10-CM | POA: Diagnosis not present

## 2022-10-14 DIAGNOSIS — Z23 Encounter for immunization: Secondary | ICD-10-CM | POA: Diagnosis not present

## 2022-10-14 LAB — MICROALBUMIN / CREATININE URINE RATIO
Creatinine,U: 112.8 mg/dL
Microalb Creat Ratio: 0.9 mg/g (ref 0.0–30.0)
Microalb, Ur: 1 mg/dL (ref 0.0–1.9)

## 2022-10-14 MED ORDER — OLMESARTAN MEDOXOMIL 20 MG PO TABS: 20.0000 mg | ORAL_TABLET | Freq: Every day | ORAL | 2 refills | Status: DC

## 2022-10-14 NOTE — Patient Instructions (Addendum)
Please go to the pharmacy to ask about your tetanus booster.  Give [place] 2-3 business days to get the results of your labs back.   If you do not hear anything about your referral in the next 1-2 weeks, call our office and ask for an update.  Consider a happy light or OTC light therapy to help with your mood.   Please consider counseling. Contact [phone] to schedule an appointment or inquire about cost/insurance coverage.  [facility] located at [place], [place], [place].  Phone number = [phone].  Dr. Lennice Sites - Adult Psychiatry.    [facility] located at [place], [place], [place]. Phone number = [phone].   [facility] located at [address], [place], [place].  Phone number = [phone].   [facility] located at [place], [place], [place].  Phone number = [phone].  Let us know if you need anything.

## 2022-10-14 NOTE — Progress Notes (Signed)
Chief Complaint  Patient presents with   Follow-up    Discuss Diabetes. Elevated BP Forgetting     Subjective: Patient is a 76 y.o. female here for follow-up.  Newly diagnosed with diabetes.  A1c was 6.8.  Diet is fair, she will sometimes go outside and walk.  She does not follow with an eye doctor.  She is on simvastatin 40 mg daily.  She is been having difficulty with her memory and mood.  She was placed on buspirone 7.5 mg twice daily and has not noticed an improvement.  She feels better when she goes outside and walks.  She does not like going back to her house because her husband has passed away and it is dark inside.  She has never been diagnosed with seasonal affective disorder.  She is not currently following with a therapist.  No homicidal or suicidal ideation.  No self-medication.  High blood pressure Patient presents with elevated blood pressure She does monitor home blood pressures. Blood pressures ranging on average from 150-160's/90's. She is not currently taking any medication. Diet/exercise as above. No chest pain or shortness of breath.  Past Medical History:  Diagnosis Date   Anxiety    Breast cancer ([place]) 01/2021   left breast IDC   Complication of anesthesia    Family history of breast cancer 12/16/2020   Family history of gastric cancer 12/16/2020   Fibromyalgia    GERD (gastroesophageal reflux disease)    Hyperlipidemia    Hypothyroidism    Osteoarthritis    knees, hands, fingers   Osteoarthritis    PONV (postoperative nausea and vomiting)    Skin cancer     Objective: BP (!) 156/84 (BP Location: Left Arm, Cuff Size: Normal)   Pulse 61   Temp 98 F (36.7 C) (Oral)   Ht 5' (1.524 m)   Wt 121 lb (54.9 kg)   SpO2 97%   BMI 23.63 kg/m  General: Awake, appears stated age Heart: DP pulses 2+ b/l, RRR, No LE edema Lungs: CTAB. No accessory muscle use Neuro: Sensation intact to pinprick b/l Skin: No ext lesions noted Psych: Age appropriate  judgment and insight, normal affect and mood  Assessment and Plan: Type 2 diabetes mellitus with hyperglycemia, without long-term current use of insulin ([place]) - Plan: Microalbumin / creatinine urine ratio, Ambulatory referral to Ophthalmology  Essential hypertension  Seasonal affective disorder ([place])  Need for influenza vaccination - Plan: Flu Vaccine QUAD High Dose(Fluad)  Continue Zocor 40 mg daily.  Check urine today.  Refer to ophthalmology.  Does not need any glucose premedications at this time.  Counseled on diet and exercise. Chronic, uncontrolled.  Start olmesartan 20 mg daily.  Monitor blood pressures at home.  Follow-up in 2 weeks to recheck this and her blood work. Recommended to get a home light to help with her mood.  Counseling information provided. The patient voiced understanding and agreement to the plan.  [place] , DO 10/14/22  12:22 PM

## 2022-10-20 ENCOUNTER — Other Ambulatory Visit: Payer: Self-pay | Admitting: Family Medicine

## 2022-10-26 ENCOUNTER — Encounter: Payer: Self-pay | Admitting: Family Medicine

## 2022-10-28 ENCOUNTER — Encounter: Payer: Self-pay | Admitting: Family Medicine

## 2022-10-28 ENCOUNTER — Ambulatory Visit (INDEPENDENT_AMBULATORY_CARE_PROVIDER_SITE_OTHER): Payer: Medicare HMO | Admitting: Family Medicine

## 2022-10-28 VITALS — BP 115/71 | HR 73 | Temp 98.0°F | Resp 16 | Wt 118.0 lb

## 2022-10-28 DIAGNOSIS — I10 Essential (primary) hypertension: Secondary | ICD-10-CM

## 2022-10-28 LAB — BASIC METABOLIC PANEL
BUN: 11 mg/dL (ref 6–23)
CO2: 27 mEq/L (ref 19–32)
Calcium: 8.8 mg/dL (ref 8.4–10.5)
Chloride: 104 mEq/L (ref 96–112)
Creatinine, Ser: 0.63 mg/dL (ref 0.40–1.20)
GFR: 85.96 mL/min (ref >60.00)
Glucose, Bld: 148 mg/dL — ABNORMAL HIGH (ref 70–99)
Potassium: 4.3 mEq/L (ref 3.5–5.1)
Sodium: 141 mEq/L (ref 135–145)

## 2022-10-28 NOTE — Patient Instructions (Addendum)
Keep the diet clean and stay active.  Give us 2-3 business days to get the results of your labs back.   Because your blood pressure is well-controlled, you no longer have to check your blood pressure at home anymore unless you wish. Some people check it twice daily every day and some people stop altogether. Either or anything in between is fine. Strong work!  Let us know if you need anything.  

## 2022-10-28 NOTE — Progress Notes (Signed)
Chief Complaint  Patient presents with   Hypertension    Here for 2 weeks follow up    Subjective Gina Hunt is a 76 y.o. female who presents for hypertension follow up. She does monitor home blood pressures. Blood pressures ranging from 120's/70's on average. She is compliant with medication- olmesartan 20 mg/d. Patient has these side effects of medication: none She is sometimes adhering to a healthy diet overall. Current exercise: walking No CP or SOB.    Past Medical History:  Diagnosis Date   Anxiety    Breast cancer ([place]) 01/2021   left breast IDC   Complication of anesthesia    Family history of breast cancer 12/16/2020   Family history of gastric cancer 12/16/2020   Fibromyalgia    GERD (gastroesophageal reflux disease)    Hyperlipidemia    Hypothyroidism    Osteoarthritis    knees, hands, fingers   Osteoarthritis    PONV (postoperative nausea and vomiting)    Skin cancer     Exam BP 115/71 (BP Location: Left Arm, Patient Position: Sitting, Cuff Size: Small)   Pulse 73   Temp 98 F (36.7 C) (Oral)   Resp 16   Wt 118 lb (53.5 kg)   SpO2 99%   BMI 23.05 kg/m  General:  well developed, well nourished, in no apparent distress Heart: RRR, no bruits, no LE edema Lungs: clear to auscultation, no accessory muscle use Psych: well oriented with normal range of affect and appropriate judgment/insight  Essential hypertension - Plan: Basic metabolic panel  Chronic, stable. Cont olmesartan 20 mg/d. Counseled on diet and exercise. Ck renal function and lytes today. Rec'd going to pharmacy for tetanus booster.  F/u in 5 mo for CPE. The patient voiced understanding and agreement to the plan.  [place], DO 10/28/22  10:19 AM

## 2022-11-14 ENCOUNTER — Encounter: Payer: Self-pay | Admitting: Oncology

## 2022-11-21 ENCOUNTER — Other Ambulatory Visit: Payer: Self-pay

## 2022-11-21 ENCOUNTER — Inpatient Hospital Stay: Payer: Medicare PPO | Attending: Hematology and Oncology | Admitting: Hematology and Oncology

## 2022-11-21 VITALS — BP 125/81 | HR 84 | Temp 97.9°F | Resp 16 | Ht 60.0 in | Wt 120.7 lb

## 2022-11-21 DIAGNOSIS — Z9012 Acquired absence of left breast and nipple: Secondary | ICD-10-CM | POA: Insufficient documentation

## 2022-11-21 DIAGNOSIS — Z170 Estrogen receptor positive status [ER+]: Secondary | ICD-10-CM | POA: Diagnosis not present

## 2022-11-21 DIAGNOSIS — Z9221 Personal history of antineoplastic chemotherapy: Secondary | ICD-10-CM | POA: Diagnosis not present

## 2022-11-21 DIAGNOSIS — R682 Dry mouth, unspecified: Secondary | ICD-10-CM | POA: Diagnosis not present

## 2022-11-21 DIAGNOSIS — C50412 Malignant neoplasm of upper-outer quadrant of left female breast: Secondary | ICD-10-CM | POA: Diagnosis not present

## 2022-11-21 DIAGNOSIS — Z79899 Other long term (current) drug therapy: Secondary | ICD-10-CM | POA: Insufficient documentation

## 2022-11-21 DIAGNOSIS — R32 Unspecified urinary incontinence: Secondary | ICD-10-CM | POA: Insufficient documentation

## 2022-11-21 NOTE — Progress Notes (Signed)
[facility]  [phone] [phone]     ID: Gina Hunt DOB: 1946-01-21  MR#: [id number]  BMW#:[id number]  Patient Care Team: [name], DO as PCP - General (Family Medicine) Rolm Bookbinder, MD as Consulting Physician (General Surgery) Kyung Rudd, MD as Consulting Physician (Radiation Oncology) Sheryn Bison, MD as Consulting Physician (Dermatology) Harmon Pier, RN as Registered Nurse Benay Pike, MD as Consulting Physician (Hematology and Oncology) Delice Bison, Charlestine Massed, NP as Nurse Practitioner (Hematology and Oncology) Benay Pike, MD as Consulting Physician (Hematology and Oncology) Benay Pike, MD OTHER MD:  CHIEF COMPLAINT: Estrogen receptor positive breast cancer (s/p left mastectomy)  CURRENT TREATMENT: Tamoxifen  INTERVAL HISTORY:  Gina Hunt returns today for follow up and treatment of her estrogen receptor positive breast cancer.  [name] began adjuvant CMF on 05/17/2021.  Last day of chemo 10/11/2021 She started antiestrogen therapy, 10/31/2021, tamoxifen.  She is tolerating it well She reports some dryness of mouth and eyes which is her biggest concern. She also has shooting burning pains in the right breast for the past 2 months. She doesn't think this is bothersome enough to warrant further investigation at this time.  Rest of the pertinent 10 point ROS reviewed and negative.    COVID 19 VACCINATION STATUS: [place] x2, booster September 2022   HISTORY OF CURRENT ILLNESS: From the original intake note:  Gina Hunt (pronounced "Gina Hunt") had routine screening mammography on 11/24/2020 showing a possible abnormality in the left breast. She underwent left breast ultrasonography at [facility] on 12/02/2020 showing: breast density category A; 2.5 cm mass in left breast at 2 o'clock (measuring 1.7 cm in ultrasound); no abnormal left axillary lymph nodes.  Accordingly on 12/08/2020 she proceeded to biopsy of  the left breast area in question. The pathology from this procedure ([id number]) showed: invasive ductal carcinoma, grade 3. Prognostic indicators significant for: estrogen receptor, 95% positive with strong staining intensity and progesterone receptor, 80% positive with moderate staining intensity. Proliferation marker Ki67 at 30%. HER2 equivocal by immunohistochemistry (2+), but negative by fluorescent in situ hybridization with a signals ratio 1.64 and number per cell 2.95.   Cancer Staging  Malignant neoplasm of upper-outer quadrant of left breast in female, estrogen receptor positive ([place]) Staging form: Breast, AJCC 8th Edition - Clinical stage from 12/16/2020: Stage IIA (cT2, cN0, cM0, G3, ER+, PR+, HER2-) - Signed by Chauncey Cruel, MD on 12/16/2020 Stage prefix: Initial diagnosis   The patient's subsequent history is as detailed below.   PAST MEDICAL HISTORY: Past Medical History:  Diagnosis Date   Anxiety    Breast cancer ([place]) 01/2021   left breast IDC   Complication of anesthesia    Family history of breast cancer 12/16/2020   Family history of gastric cancer 12/16/2020   Fibromyalgia    GERD (gastroesophageal reflux disease)    Hyperlipidemia    Hypothyroidism    Osteoarthritis    knees, hands, fingers   Osteoarthritis    PONV (postoperative nausea and vomiting)    Skin cancer     PAST SURGICAL HISTORY: Past Surgical History:  Procedure Laterality Date   ABDOMINAL HYSTERECTOMY     COLONOSCOPY  11/24/2015   ESOPHAGEAL MANOMETRY N/A 07/20/2022   Procedure: ESOPHAGEAL MANOMETRY (EM);  Surgeon: Lavena Bullion, DO;  Location: [facility];  Service: Gastroenterology;  Laterality: N/A;   EVACUATION BREAST HEMATOMA Left 04/07/2021   Procedure: EVACUATION HEMATOMA BREAST;  Surgeon: Rolm Bookbinder, MD;  Location: [place];  Service:  General;  Laterality: Left;   LIVER SURGERY     PORTA CATH INSERTION Right 04/07/2021   Procedure: PORTA CATH  INSERTION;  Surgeon: Rolm Bookbinder, MD;  Location: [place];  Service: General;  Laterality: Right;   SIMPLE MASTECTOMY WITH AXILLARY SENTINEL NODE BIOPSY Left 04/07/2021   Procedure: LEFT SIMPLE MASTECTOMY WITH LEFT AXILLARY SENTINEL NODE BIOPSY;  Surgeon: Rolm Bookbinder, MD;  Location: [place];  Service: General;  Laterality: Left;   TONSILLECTOMY     TRIGGER FINGER RELEASE Right 03/03/2022    FAMILY HISTORY: Family History  Problem Relation Age of Onset   Hypertension Mother    Diabetes Mother    Arthritis Mother    Heart disease Mother    Rheum arthritis Father    Diabetes Father    Heart disease Father    Stomach cancer Brother 9   Stomach cancer Brother 77   Esophageal cancer Brother 34   Breast cancer Maternal Aunt 70   Breast cancer Maternal Aunt 72   Breast cancer Maternal Aunt 73   Colon cancer Neg Hx    Colon polyps Neg Hx    Pancreatic cancer Neg Hx   Her father and mother both died from MI, father age 51 and mother age 77. Gina Hunt has 6 brothers and 1 sister. She reports breast cancer in 3 maternal aunts, one in their 4's. One of her maternal aunt's daughter (Gina Hunt's cousin) had breast cancer at age 41.The patient also reports stomach cancer in two of her brothers in their 63's, one of whom also had esophageal cancer.    GYNECOLOGIC HISTORY:  No LMP recorded. Patient has had a hysterectomy. Menarche: 77 years old Age at first live birth: 77 years old [place] P 1 LMP age 13 Contraceptive: never used HRT never used  Hysterectomy? yes BSO? yes   SOCIAL HISTORY: (updated 12/2020)  Gina Hunt is currently retired from working at the [employer] in [place] died late February 3299 from complications of Agent Orange exposure. She lives by herself. Daughter Gina Hunt, the patient believes, but they have been alienated for many years.    ADVANCED DIRECTIVES: not in place; intends to name  her niece, Gina Hunt, as her Hunt.  The patient was given the appropriate documents to complete and notarized at her discretion on her 12/16/2020 visit   HEALTH MAINTENANCE: Social History   Tobacco Use   Smoking status: Never   Smokeless tobacco: Never  Vaping Use   Vaping Use: Never used  Substance Use Topics   Alcohol use: Yes    Comment: rarely    Drug use: Never     Colonoscopy: 05/2019 (Dr. Bryan Lemma), recall 2027  PAP: "10 years ago" (~2012)  Bone density: 2020   Allergies  Allergen Reactions   Codeine Other (See Comments)    Hallucination  Other reaction(s): Respiratory Distress   Morphine And Related Other (See Comments)    Knocks me out per pt    Current Outpatient Medications  Medication Sig Dispense Refill   cycloSPORINE (RESTASIS) 0.05 % ophthalmic emulsion Place 1 drop into both eyes 2 (two) times daily. 0.4 mL 1   dicyclomine (BENTYL) 10 MG capsule Take 1 capsule (10 mg total) by mouth every 6 (six) hours as needed for spasms. 60 capsule 3   DULoxetine (CYMBALTA) 60 MG capsule Take 1 capsule (60 mg total) by mouth 2 (two) times daily. 180 capsule 2   gabapentin (NEURONTIN) 300 MG  capsule Take 1 capsule (300 mg total) by mouth 3 (three) times daily. 270 capsule 1   levothyroxine (SYNTHROID) 50 MCG tablet TAKE 1 TABLET EVERY DAY BEFORE BREAKFAST 90 tablet 3   olmesartan (BENICAR) 20 MG tablet Take 1 tablet (20 mg total) by mouth daily. 30 tablet 2   RABEprazole (ACIPHEX) 20 MG tablet Take 1 tablet (20 mg total) by mouth 2 (two) times daily. 180 tablet 1   simvastatin (ZOCOR) 40 MG tablet TAKE 1 TABLET EVERY DAY 90 tablet 2   tamoxifen (NOLVADEX) 20 MG tablet TAKE 1 TABLET EVERY DAY 90 tablet 3   traMADol (ULTRAM) 50 MG tablet tramadol 50 mg tablet  TAKE 1 TABLET BY MOUTH EVERY 6 HOURS AS NEEDED     No current facility-administered medications for this visit.    OBJECTIVE: White woman who appears stated age  20:   11/21/22 1302  BP: 125/81   Pulse: 84  Resp: 16  Temp: 97.9 F (36.6 C)  SpO2: 96%      Body mass index is 23.57 kg/m.   Wt Readings from Last 3 Encounters:  11/21/22 120 lb 11.2 oz (54.7 kg)  10/28/22 118 lb (53.5 kg)  10/14/22 121 lb (54.9 kg)     ECOG FS:1 - Symptomatic but completely ambulatory  Physical Exam Constitutional:      Appearance: Normal appearance.  Cardiovascular:     Rate and Rhythm: Normal rate and regular rhythm.  Pulmonary:     Effort: Pulmonary effort is normal.     Breath sounds: Normal breath sounds.  Chest:     Comments: Bilateral breasts inspected.  Left breast status postmastectomy. No palpable masses in the right breast. No regional lymphadenopathy. Abdominal:     General: There is no distension.     Tenderness: There is no abdominal tenderness. There is no guarding.  Musculoskeletal:        General: No swelling. Normal range of motion.     Cervical back: Normal range of motion and neck supple. No rigidity.  Lymphadenopathy:     Cervical: No cervical adenopathy.  Skin:    General: Skin is warm and dry.  Neurological:     Mental Status: She is alert.       LAB RESULTS:  CMP     Component Value Date/Time   NA 141 10/28/2022 1045   K 4.3 10/28/2022 1045   CL 104 10/28/2022 1045   CO2 27 10/28/2022 1045   GLUCOSE 148 (H) 10/28/2022 1045   BUN 11 10/28/2022 1045   CREATININE 0.63 10/28/2022 1045   CREATININE 0.72 04/27/2021 1236   CREATININE 0.58 (L) 12/20/2019 1422   CALCIUM 8.8 10/28/2022 1045   PROT 6.4 09/12/2022 1358   ALBUMIN 3.7 09/12/2022 1358   AST 12 09/12/2022 1358   AST 15 04/27/2021 1236   ALT 8 09/12/2022 1358   ALT 11 04/27/2021 1236   ALKPHOS 68 09/12/2022 1358   BILITOT 0.3 09/12/2022 1358   BILITOT 0.3 04/27/2021 1236   GFRNONAA >60 12/23/2021 1042   GFRNONAA >60 04/27/2021 1236    No results found for: "TOTALPROTELP", "ALBUMINELP", "A1GS", "A2GS", "BETS", "BETA2SER", "GAMS", "MSPIKE", "SPEI"  Lab Results  Component Value Date    WBC 7.2 09/12/2022   NEUTROABS 3.5 12/23/2021   HGB 13.8 09/12/2022   HCT 41.7 09/12/2022   MCV 95.6 09/12/2022   PLT 256.0 09/12/2022    No results found for: "LABCA2"  No components found for: "MGQQPY195"  No results for input(s): "INR" in  the last 168 hours.  No results found for: "LABCA2"  No results found for: "BWG665"  No results found for: "CAN125"  No results found for: "CAN153"  No results found for: "CA2729"  No components found for: "HGQUANT"  No results found for: "CEA1", "CEA" / No results found for: "CEA1", "CEA"   No results found for: "AFPTUMOR"  No results found for: "CHROMOGRNA"  No results found for: "KPAFRELGTCHN", "LAMBDASER", "KAPLAMBRATIO" (kappa/lambda light chains)  No results found for: "HGBA", "HGBA2QUANT", "HGBFQUANT", "HGBSQUAN" (Hemoglobinopathy evaluation)   No results found for: "LDH"  No results found for: "IRON", "TIBC", "IRONPCTSAT" (Iron and TIBC)  No results found for: "FERRITIN"  Urinalysis    Component Value Date/Time   BILIRUBINUR negative 12/13/2021 1548   PROTEINUR Negative 12/13/2021 1548   UROBILINOGEN 0.2 12/13/2021 1548   NITRITE negative 12/13/2021 1548   LEUKOCYTESUR Trace (A) 12/13/2021 1548     STUDIES: No results found.   ELIGIBLE FOR AVAILABLE RESEARCH PROTOCOL: no  ASSESSMENT: 77 y.o. [place], [place] woman status post left breast upper outer quadrant biopsy 12/08/2020 for a clinical T2N0, stage IIA invasive ductal carcinoma, grade 3, estrogen and progesterone receptor positive, HER-2 not amplified, with an MIB-1 of 30%  (1) Oncotype obtained from the original biopsy shows a score of 32, predicting a risk of recurrence outside the breast in the next 9 years of 20% if node-negative, 25% if node positive, if antiestrogens are the only systemic treatment, also predicting a greater than 15% benefit from chemotherapy  (2) genetics testing 01/01/2021 through the [facility] CustomNext-Cancer +RNAinsight Panel  found no deleterious mutations in APC, ATM, AXIN2, BARD1, BMPR1A, BRCA1, BRCA2, BRIP1, CDH1, CDK4, CDKN2A, CHEK2, DICER1, EPCAM, GREM1, HOXB13, MEN1, MLH1, MSH2, MSH3, MSH6, MUTYH, NBN, NF1, NF2, NTHL1, PALB2, PMS2, POLD1, POLE, PTEN, RAD51C, RAD51D, RECQL, RET, SDHA, SDHAF2, SDHB, SDHC, SDHD, SMAD4, SMARCA4, STK11, TP53, TSC1, TSC2, and VHL.  RNA data is routinely analyzed for use in variant interpretation for all genes.  (3) anastrozole started neoadjuvantly 12/16/2020, discontinued 03/2021 with multiple side effects  (4) status post left mastectomy and sentinel lymph node sampling 04/07/2021 for a pT2 pN0, stage IIA invasive ductal carcinoma, grade 2, with negative margins  (A) a single left axilla lymph node removed  (B) repeat prognostic panel finds the cancer estrogen receptor positive, progesterone receptor and HER2 negative  (C) palpable lesion in the left breast consistent with evolving 0.5 oil cyst/fat necrosis by ultrasonography at [facility] on 07/20/2021  (5) adjuvant chemotherapy with cyclophosphamide, methotrexate and fluorouracil (CMF) started 05/17/2021, repeated every 21 days times 8, last dose 10/11/2021  (6) started tamoxifen 10/31/2021   PLAN:  Patient is here with her niece. She is doing well on Tamoxifen. She denies any new health issues today except for dry mouth and dry eyes. I wonder if this is from Cymbalta She describes burning pain in the right breast. No palpable masses on exam today. I offered her an [place] if she is worried. She wants to wait and see if it gets better. She understands that typically cancer doesn't present with burning pain, its usually pain less. She was encouraged to continue SBE,  Tamoxifen, RTC in one yr or sooner as needed. Urinary incontinence, recommended Kegel exercises.  Total encounter time 30 minutes.*   *Total Encounter Time as defined by the Centers for Medicare and Medicaid Services includes, in addition to the face-to-face time of a patient  visit (documented in the note above) non-face-to-face time: obtaining and reviewing outside history, ordering and reviewing  medications, tests or procedures, care coordination (communications with other health care professionals or caregivers) and documentation in the medical record.

## 2022-11-22 ENCOUNTER — Other Ambulatory Visit: Payer: Self-pay | Admitting: Family Medicine

## 2022-11-22 DIAGNOSIS — M797 Fibromyalgia: Secondary | ICD-10-CM

## 2022-11-22 MED ORDER — DULOXETINE HCL 60 MG PO CPEP: 60.0000 mg | ORAL_CAPSULE | Freq: Every day | ORAL | 2 refills | Status: DC

## 2022-12-05 ENCOUNTER — Ambulatory Visit: Admit: 2022-12-05 | Payer: Medicare HMO | Admitting: General Surgery

## 2022-12-05 SURGERY — REPAIR, HERNIA, HIATAL, ROBOT-ASSISTED
Anesthesia: General

## 2022-12-25 ENCOUNTER — Encounter: Payer: Self-pay | Admitting: Family Medicine

## 2022-12-27 ENCOUNTER — Encounter: Payer: Self-pay | Admitting: Family Medicine

## 2022-12-27 ENCOUNTER — Ambulatory Visit (INDEPENDENT_AMBULATORY_CARE_PROVIDER_SITE_OTHER): Payer: Medicare PPO | Admitting: Family Medicine

## 2022-12-27 VITALS — BP 110/70 | HR 80 | Temp 98.2°F | Ht 60.0 in | Wt 118.2 lb

## 2022-12-27 DIAGNOSIS — K219 Gastro-esophageal reflux disease without esophagitis: Secondary | ICD-10-CM

## 2022-12-27 MED ORDER — ALUM & MAG HYDROXIDE-SIMETH 200-200-20 MG/5ML PO SUSP
15.0000 mL | Freq: Once | ORAL | Status: AC
  Administered 2022-12-27: 15 mL via ORAL

## 2022-12-27 MED ORDER — HYOSCYAMINE SULFATE 0.125 MG SL SUBL
0.1250 mg | SUBLINGUAL_TABLET | Freq: Once | SUBLINGUAL | Status: AC
  Administered 2022-12-27: 0.125 mg via SUBLINGUAL

## 2022-12-27 MED ORDER — LIDOCAINE VISCOUS HCL 2 % MT SOLN
15.0000 mL | Freq: Once | OROMUCOSAL | Status: AC
  Administered 2022-12-27: 15 mL via OROMUCOSAL

## 2022-12-27 NOTE — Patient Instructions (Addendum)
Stay on the Aciphex. Add Pepcid/famotidine 20 mg twice daily for the next 2 weeks.   Send me a message in 2-3 days if no improvement. We will add a new medicine.  Stay hydrated.  Let us know if you need anything.

## 2022-12-27 NOTE — Progress Notes (Signed)
Chief Complaint  Patient presents with   Pain    Starts in abdomen and wraps around to her back Not able to eat well, puts pressure on where the pain is. Symptoms have been going on for a week.     Subjective: Patient is a 77 y.o. female here for abd pain.  Burning pain in epigastric region radiating to R side. Radiates around to her back. Eating makes things worse. Compliant w Aciphex. Had diarrhea yesterday but have been normal overall. No bleeding. It has been waking her up. No N/V, fevers since Sat night (100 F), new or undercooked foods, recent abx, recent travel.    Past Medical History:  Diagnosis Date   Anxiety    Breast cancer ([place]) 01/2021   left breast IDC   Complication of anesthesia    Family history of breast cancer 12/16/2020   Family history of gastric cancer 12/16/2020   Fibromyalgia    GERD (gastroesophageal reflux disease)    Hyperlipidemia    Hypothyroidism    Osteoarthritis    knees, hands, fingers   Osteoarthritis    PONV (postoperative nausea and vomiting)    Skin cancer     Objective: BP 110/70 (BP Location: Left Arm, Patient Position: Sitting, Cuff Size: Normal)   Pulse 80   Temp 98.2 F (36.8 C) (Oral)   Ht 5' (1.524 m)   Wt 118 lb 4 oz (53.6 kg)   SpO2 97%   BMI 23.09 kg/m  General: Awake, appears stated age Mouth: MMM Heart: RRR, no LE edema Lungs: CTAB, no rales, wheezes or rhonchi. No accessory muscle use Abdomen: Bowel sounds present, soft, TTP diffusely, worse in the epigastric region; negative McBurney's, root Murphy's, Rovsing's, Carnett's Psych: Age appropriate judgment and insight, normal affect and mood  Assessment and Plan: Gastroesophageal reflux disease, unspecified whether esophagitis present - Plan: hyoscyamine (LEVSIN SL) SL tablet 0.125 mg, lidocaine (XYLOCAINE) 2 % viscous mouth solution 15 mL, alum & mag hydroxide-simeth (MAALOX/MYLANTA) 200-200-20 MG/5ML suspension 15 mL  She did better with a GI cocktail.  She will  continue Aciphex 20 mg twice daily.  Reflux precautions discussed.  She will add Pepcid 20 mg twice daily for the next week.  If no improvement, would consider low-dose of nortriptyline to help with situational stress/IBS as her husband passed away 2 years ago around this time.  Follow-up in 2 weeks if no improvement.  Seek immediate care if worsening before then. The patient voiced understanding and agreement to the plan.  [place], DO 12/27/22  11:59 AM

## 2022-12-29 ENCOUNTER — Encounter: Payer: Self-pay | Admitting: Family Medicine

## 2022-12-31 IMAGING — CT CT ABD-PELV W/ CM
2 of 5 series · 16 of 46 positions shown, 18 images · IV contrast (Omnipaque)
Comparison: None.

CLINICAL DATA: Generalized abdominal pain

EXAM:
CT ABDOMEN AND PELVIS WITH CONTRAST
TECHNIQUE: Multidetector CT imaging of the abdomen and pelvis was performed
using the standard protocol following bolus administration of
intravenous contrast.
CONTRAST:  100mL OMNIPAQUE IOHEXOL 300 MG/ML  SOLN

[Series 2: axial st · axial · 0.81mm/px · z∈[-449,-79]mm · 13 of 84 slices shown, 15 images]
[im 5/84  soft-tissue]
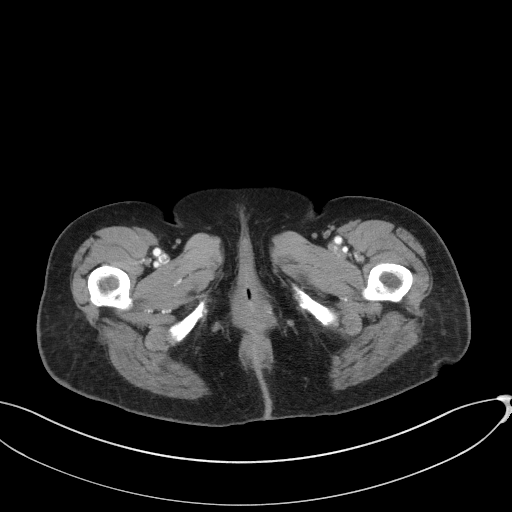
[im 5/84  bone]
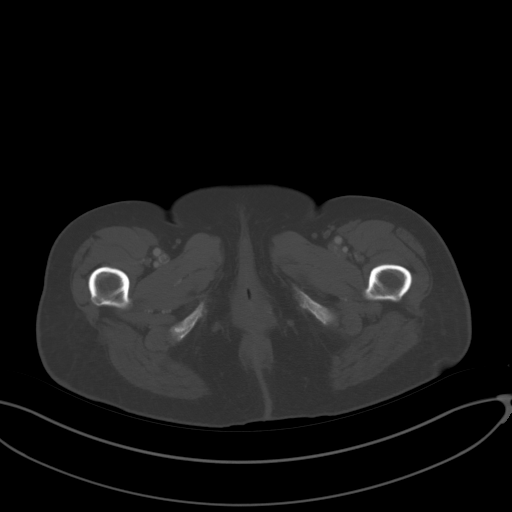
[im 13/84  soft-tissue]
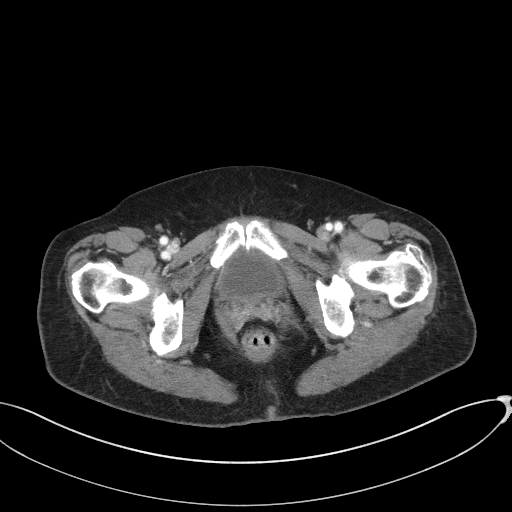
[im 17/84  soft-tissue]
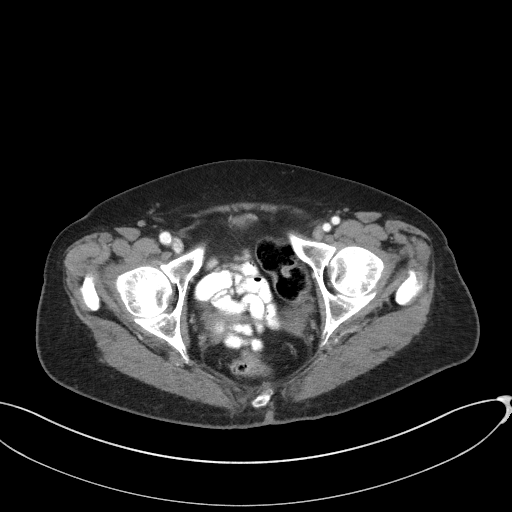
[im 25/84  soft-tissue]
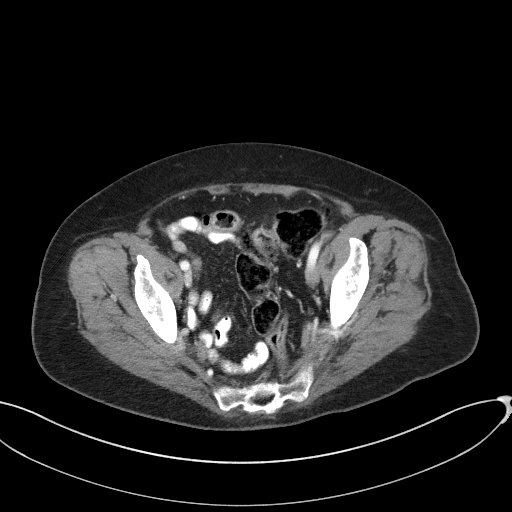
[im 30/84  soft-tissue]
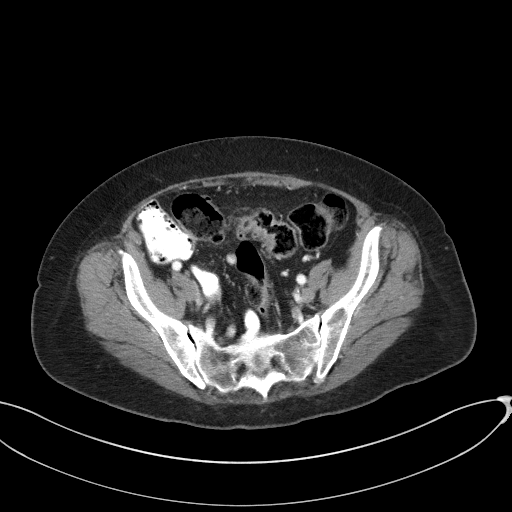
[im 38/84  soft-tissue]
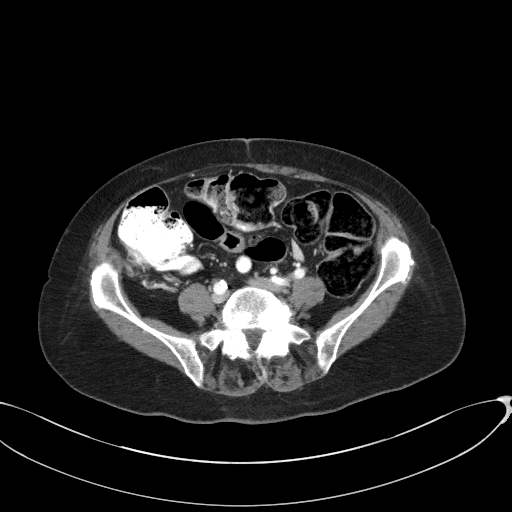
[im 42/84  soft-tissue]
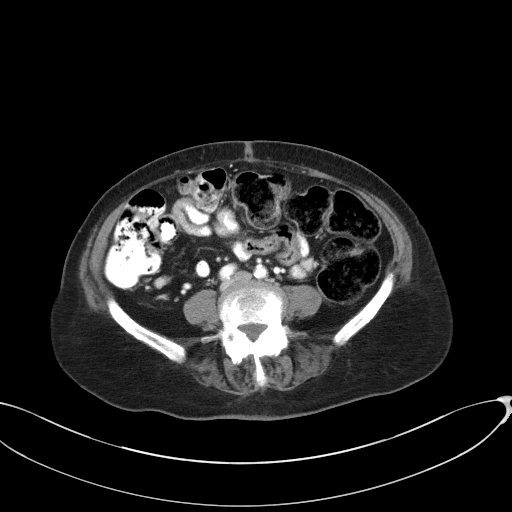
[im 46/84  soft-tissue]
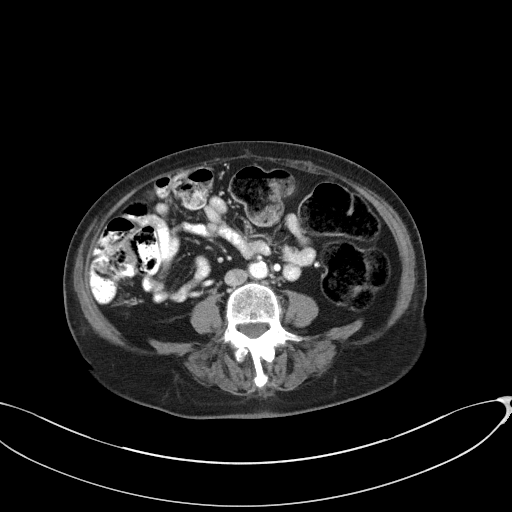
[im 54/84  soft-tissue]
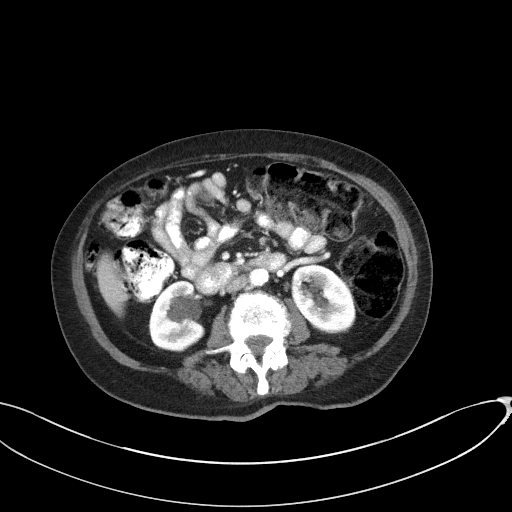
[im 54/84  bone]
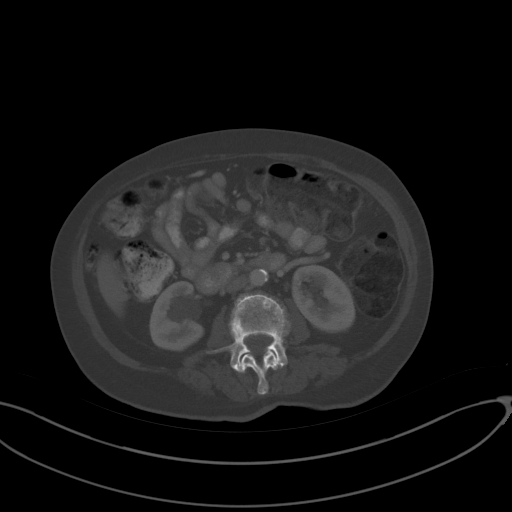
[im 59/84  soft-tissue]
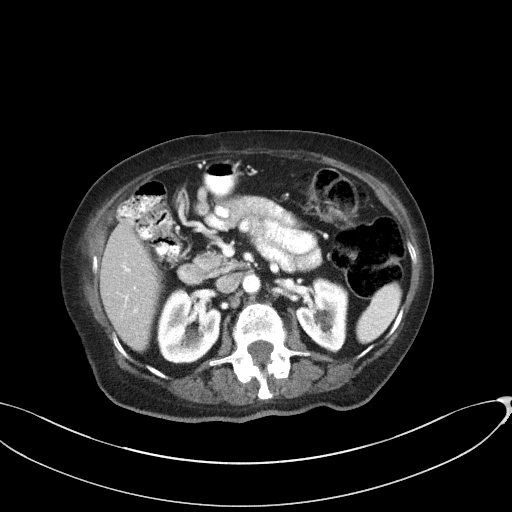
[im 67/84  soft-tissue]
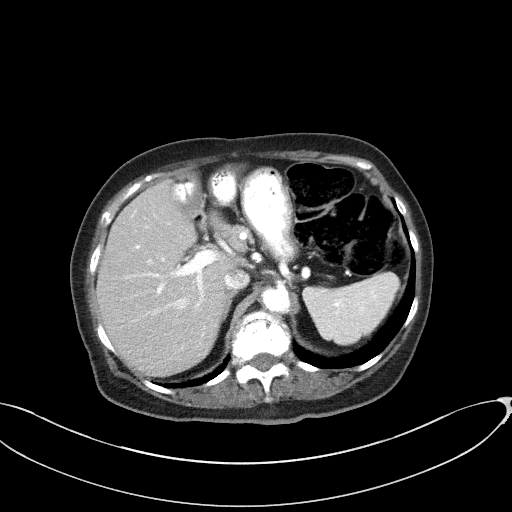
[im 71/84  soft-tissue]
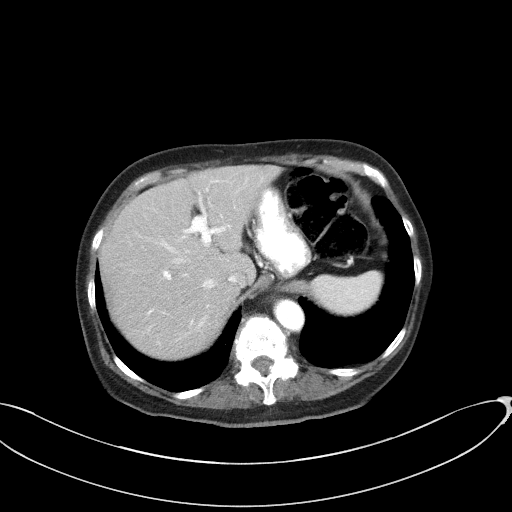
[im 79/84  soft-tissue]
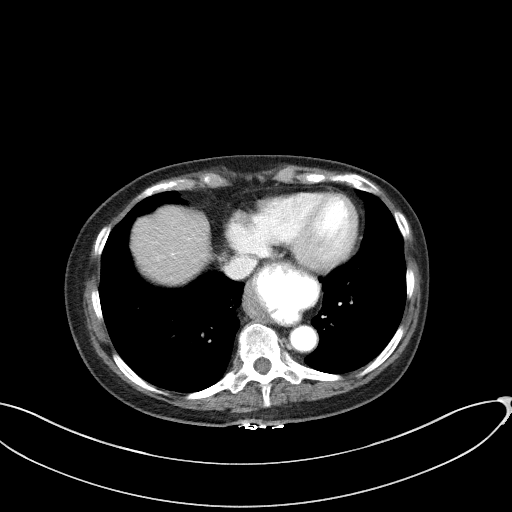

[Series 5: coronal st · coronal · 0.70mm/px · 3 of 80 slices shown]
[im 27/80  soft-tissue]
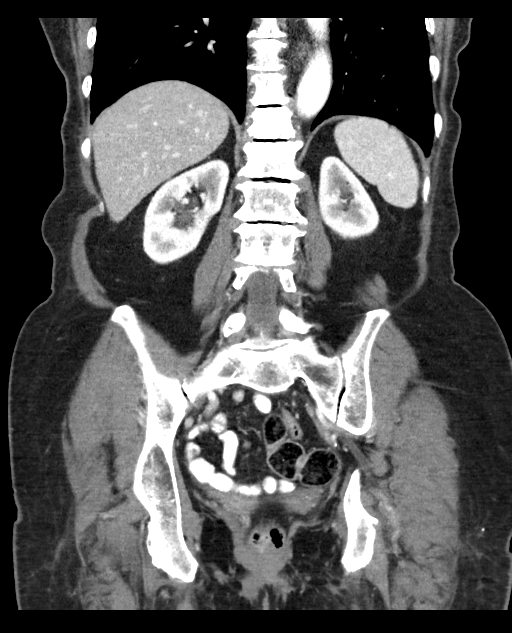
[im 36/80  soft-tissue]
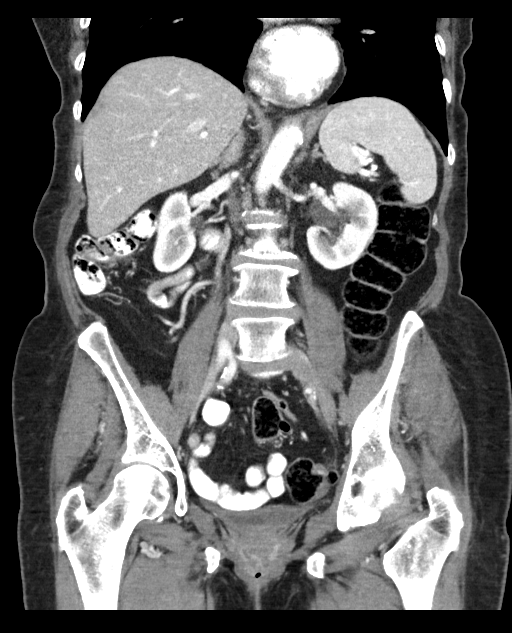
[im 44/80  soft-tissue]
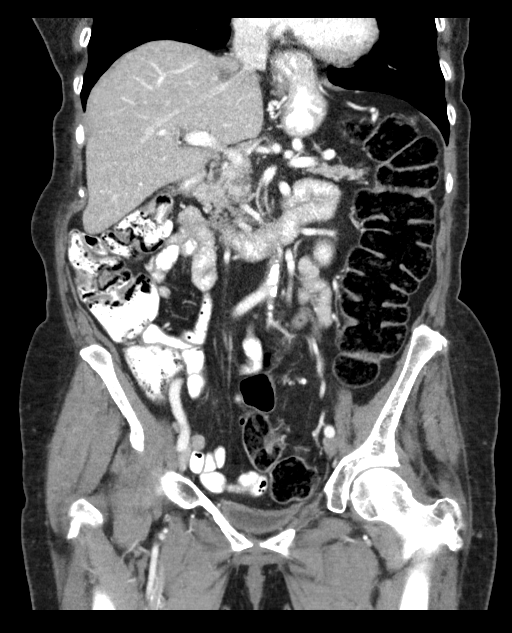

[16 of 46 positions shown; findings below may reference images not displayed]

FINDINGS: Lower chest: No acute abnormality.  Moderate to large hiatal hernia.

Hepatobiliary: Scattered small low-attenuation liver lesions which
are likely simple cysts. Gallbladder is not visualized and is likely
surgically absent. No biliary ductal dilation.

Pancreas: Unremarkable. No pancreatic ductal dilatation or
surrounding inflammatory changes.

Spleen: Normal in size without focal abnormality.

Adrenals/Urinary Tract: Kidneys enhance symmetrically with no
evidence of hydronephrosis or nephrolithiasis. Bilateral renal sinus
cysts. Bladder is decompressed

Stomach/Bowel: Stomach is within normal limits. Appendix is not
visualized. Diverticulosis. Focal wall thickening of the sigmoid
colon with no significant adjacent inflammatory change. No evidence
of obstruction.

Vascular/Lymphatic: Aortic atherosclerosis. No enlarged abdominal or
pelvic lymph nodes.

Reproductive: Status post hysterectomy. No adnexal masses.

Other: No abdominopelvic ascites.

Musculoskeletal: No acute or significant osseous findings.
IMPRESSION: 1. Focal wall thickening of the sigmoid colon with no significant
adjacent inflammatory change, differential includes sequela of prior
acute diverticulitis versus neoplasm. Recommend further evaluation
with colonoscopy.
2. Diverticulosis of the sigmoid colon.
3.  Aortic Atherosclerosis ([id number]-E00.0).

These results will be called to the ordering clinician or
representative by the Radiologist Assistant, and communication
documented in the PACS or [REDACTED].

## 2023-01-10 ENCOUNTER — Ambulatory Visit: Payer: Medicare PPO | Admitting: Family Medicine

## 2023-01-13 DIAGNOSIS — C50912 Malignant neoplasm of unspecified site of left female breast: Secondary | ICD-10-CM | POA: Diagnosis not present

## 2023-01-19 ENCOUNTER — Telehealth: Payer: Self-pay | Admitting: Family Medicine

## 2023-01-19 NOTE — Telephone Encounter (Signed)
Contacted Megan Salon Dicicco to schedule their annual wellness visit. Appointment made for 01/30/2023.  Sherol Dade; Care Guide Ambulatory Clinical [facility] Direct Dial: [phone]

## 2023-01-23 ENCOUNTER — Encounter: Payer: Self-pay | Admitting: Oncology

## 2023-01-30 ENCOUNTER — Ambulatory Visit (INDEPENDENT_AMBULATORY_CARE_PROVIDER_SITE_OTHER): Payer: Medicare HMO | Admitting: *Deleted

## 2023-01-30 ENCOUNTER — Ambulatory Visit: Payer: Medicare HMO | Admitting: Licensed Clinical Social Worker

## 2023-01-30 DIAGNOSIS — Z0000 Encounter for general adult medical examination without abnormal findings: Secondary | ICD-10-CM | POA: Diagnosis not present

## 2023-01-30 NOTE — Patient Outreach (Signed)
  Care Coordination  Initial Visit Note   01/30/2023 Name: Gina Hunt MRN: [id number] DOB: 1946-05-06  Gina Hunt is a 77 y.o. year old female who sees Nani Ravens, Crosby Oyster, DO for primary care. I spoke with  Gina Hunt by phone today. Referral from [facility]  What matters to the patients health and wellness today?  Getting her [employer] bill paid, scheduled for disconnect on Friday.  Patient reports difficult with paying bill since husband passed away.  Has a $500 power bill that is scheduled for disconnect  on Friday.   Goals Addressed             This Visit's Progress    Connect to community Resources       Activities and task to complete in order to accomplish goals.   I have contacted the [place] spoke with Emmit Alexanders [phone] they will contact you today to complete a CIP application to assist with preventing your power from being disconnected.  Please call if no one has called you by tomorrow morning Please continue to contact [facility] to see if you are able to get an extension on your bill.        SDOH assessments and interventions completed:  Yes  SDOH Interventions Today    Flowsheet Row Most Recent Value  SDOH Interventions   Utilities Interventions Other (Comment)  [collaborate with DSS for CIP program and with [employer]]       Care Coordination Interventions:  Yes, provided  Interventions Today    Flowsheet Row Most Recent Value  Chronic Disease   Chronic disease during today's visit Hypertension (HTN), Diabetes  General Interventions   General Interventions Discussed/Reviewed General Interventions Discussed, Emergency planning/management officer, Communication with  Communication with Hospital doctor of Social Services and [employer]]  Education Interventions   Education Provided Provided Education  Provided Verbal Education On [place]  [main stress is related to power bill]       Follow up plan: Follow up call scheduled for 02/09/23    Encounter Outcome:  Pt. Visit Completed   Casimer Lanius, [place] [phone]

## 2023-01-30 NOTE — Progress Notes (Signed)
Subjective:   Gina Hunt is a 77 y.o. female who presents for Medicare Annual (Subsequent) preventive examination.  I connected with  Gina Hunt on 01/30/23 by a audio enabled telemedicine application and verified that I am speaking with the correct person using two identifiers.  Patient Location: Home  Provider Location: Office/Clinic  I discussed the limitations of evaluation and management by telemedicine. The patient expressed understanding and agreed to proceed.   Review of Systems     Cardiac Risk Factors include: advanced age (>72men, >8 women);diabetes mellitus;dyslipidemia;hypertension     Objective:    Today's Vitals   01/30/23 0944  PainSc: 8    There is no height or weight on file to calculate BMI.     01/30/2023    9:47 AM 01/28/2022    8:17 AM 07/19/2021   10:08 AM 04/07/2021    7:25 AM 03/31/2021    2:17 PM 12/16/2020   11:46 AM 10/12/2018    1:24 PM  Advanced Directives  Does Patient Have a Medical Advance Directive? Yes Yes Yes Yes Yes Yes Yes  Type of Paramedic of [place];Living will [place];Living will [place];Living will [place];Living will Living will Healthcare Power of [place];Living will  Does patient want to make changes to medical advance directive? No - Patient declined  No - Patient declined  No - Patient declined No - Patient declined No - Patient declined  Copy of [place] in Chart? No - copy requested No - copy requested No - copy requested No - copy requested  No - copy requested No - copy requested    Current Medications (verified) Outpatient Encounter Medications as of 01/30/2023  Medication Sig   cycloSPORINE (RESTASIS) 0.05 % ophthalmic emulsion Place 1 drop into both eyes 2 (two) times daily.   dicyclomine (BENTYL) 10 MG capsule Take 1 capsule (10 mg total) by mouth every 6 (six) hours as needed  for spasms.   DULoxetine (CYMBALTA) 60 MG capsule Take 1 capsule (60 mg total) by mouth daily.   gabapentin (NEURONTIN) 300 MG capsule Take 1 capsule (300 mg total) by mouth 3 (three) times daily.   levothyroxine (SYNTHROID) 50 MCG tablet TAKE 1 TABLET EVERY DAY BEFORE BREAKFAST   olmesartan (BENICAR) 20 MG tablet Take 1 tablet (20 mg total) by mouth daily.   RABEprazole (ACIPHEX) 20 MG tablet Take 1 tablet (20 mg total) by mouth 2 (two) times daily.   simvastatin (ZOCOR) 40 MG tablet TAKE 1 TABLET EVERY DAY   tamoxifen (NOLVADEX) 20 MG tablet TAKE 1 TABLET EVERY DAY   traMADol (ULTRAM) 50 MG tablet tramadol 50 mg tablet  TAKE 1 TABLET BY MOUTH EVERY 6 HOURS AS NEEDED   No facility-administered encounter medications on file as of 01/30/2023.    Allergies (verified) Codeine and Morphine and related   History: Past Medical History:  Diagnosis Date   Anxiety    Breast cancer 01/2021   left breast IDC   Complication of anesthesia    Family history of breast cancer 12/16/2020   Family history of gastric cancer 12/16/2020   Fibromyalgia    GERD (gastroesophageal reflux disease)    Hyperlipidemia    Hypothyroidism    Osteoarthritis    knees, hands, fingers   Osteoarthritis    PONV (postoperative nausea and vomiting)    Skin cancer    Past Surgical History:  Procedure Laterality Date  ABDOMINAL HYSTERECTOMY     COLONOSCOPY  11/24/2015   ESOPHAGEAL MANOMETRY N/A 07/20/2022   Procedure: ESOPHAGEAL MANOMETRY (EM);  Surgeon: Lavena Bullion, DO;  Location: [facility];  Service: Gastroenterology;  Laterality: N/A;   EVACUATION BREAST HEMATOMA Left 04/07/2021   Procedure: EVACUATION HEMATOMA BREAST;  Surgeon: Rolm Bookbinder, MD;  Location: [facility];  Service: General;  Laterality: Left;   LIVER SURGERY     PORTA CATH INSERTION Right 04/07/2021   Procedure: PORTA CATH INSERTION;  Surgeon: Rolm Bookbinder, MD;  Location: [facility];  Service:  General;  Laterality: Right;   SIMPLE MASTECTOMY WITH AXILLARY SENTINEL NODE BIOPSY Left 04/07/2021   Procedure: LEFT SIMPLE MASTECTOMY WITH LEFT AXILLARY SENTINEL NODE BIOPSY;  Surgeon: Rolm Bookbinder, MD;  Location: [facility];  Service: General;  Laterality: Left;   TONSILLECTOMY     TRIGGER FINGER RELEASE Right 03/03/2022   Family History  Problem Relation Age of Onset   Hypertension Mother    Diabetes Mother    Arthritis Mother    Heart disease Mother    Rheum arthritis Father    Diabetes Father    Heart disease Father    Stomach cancer Brother 55   Stomach cancer Brother 95   Esophageal cancer Brother 87   Breast cancer Maternal Aunt 70   Breast cancer Maternal Aunt 72   Breast cancer Maternal Aunt 73   Colon cancer Neg Hx    Colon polyps Neg Hx    Pancreatic cancer Neg Hx    Social History   Socioeconomic History   Marital status: Widowed    Spouse name: Not on file   Number of children: Not on file   Years of education: Not on file   Highest education level: Not on file  Occupational History   Not on file  Tobacco Use   Smoking status: Never   Smokeless tobacco: Never  Vaping Use   Vaping Use: Never used  Substance and Sexual Activity   Alcohol use: Yes    Comment: rarely    Drug use: Never   Sexual activity: Not Currently    Birth control/protection: Surgical  Other Topics Concern   Not on file  Social History Narrative   Not on file   Social Determinants of Health   Financial Resource Strain: Medium Risk (01/30/2023)   Overall Financial Resource Strain (CARDIA)    Difficulty of Paying Living Expenses: Somewhat hard  Food Insecurity: No Food Insecurity (01/30/2023)   Hunger Vital Sign    Worried About Running Out of Food in the Last Year: Never true    Ran Out of Food in the Last Year: Never true  Transportation Needs: No Transportation Needs (01/30/2023)   PRAPARE - Hydrologist (Medical): No    Lack of  Transportation (Non-Medical): No  Physical Activity: Sufficiently Active (01/28/2022)   Exercise Vital Sign    Days of Exercise per Week: 6 days    Minutes of Exercise per Session: 60 min  Stress: Stress Concern Present (01/30/2023)   [place]    Feeling of Stress : To some extent  Social Connections: Moderately Integrated (01/30/2023)   Social Connection and Isolation Panel [NHANES]    Frequency of Communication with Friends and Family: Once a week    Frequency of Social Gatherings with Friends and Family: Twice a week    Attends Religious Services: More than 4 times per  year    Active Member of Clubs or Organizations: No    Attends Archivist Meetings: Never    Marital Status: Married    Tobacco Counseling Counseling given: Not Answered   Clinical Intake:  Pre-visit preparation completed: Yes  Pain : 0-10 Pain Score: 8  Pain Location: Other (Comment) (stomach/side) Pain Descriptors / Indicators: Constant Pain Onset: More than a month ago Pain Frequency: Constant     Nutritional Risks: None Diabetes: Yes CBG done?: No Did pt. bring in CBG monitor from home?: No  How often do you need to have someone help you when you read instructions, pamphlets, or other written materials from your doctor or pharmacy?: 1 - Never   Activities of Daily Living    01/30/2023    9:55 AM  In your present state of health, do you have any difficulty performing the following activities:  Hearing? 0  Vision? 0  Difficulty concentrating or making decisions? 1  Walking or climbing stairs? 0  Dressing or bathing? 0  Doing errands, shopping? 0  Preparing Food and eating ? N  Using the Toilet? N  In the past six months, have you accidently leaked urine? Y  Do you have problems with loss of bowel control? N  Managing your Medications? N  Managing your Finances? N  Housekeeping or managing your Housekeeping? N     Patient Care Team: [name], DO as PCP - General (Family Medicine) Rolm Bookbinder, MD as Consulting Physician (General Surgery) Kyung Rudd, MD as Consulting Physician (Radiation Oncology) Sheryn Bison, MD as Consulting Physician (Dermatology) Harmon Pier, RN as Registered Nurse Benay Pike, MD as Consulting Physician (Hematology and Oncology) Delice Bison, Charlestine Massed, NP as Nurse Practitioner (Hematology and Oncology) Benay Pike, MD as Consulting Physician (Hematology and Oncology)  Indicate any recent Medical Services you may have received from other than [facility] providers in the past year (date may be approximate).     Assessment:   This is a routine wellness examination for [place].  Hearing/Vision screen No results found.  Dietary issues and exercise activities discussed: Current Exercise Habits: Home exercise routine, Type of exercise: walking, Time (Minutes): 45, Frequency (Times/Week): 7, Weekly Exercise (Minutes/Week): 315, Intensity: Mild, Exercise limited by: None identified   Goals Addressed   None    Depression Screen    01/30/2023    9:50 AM 01/28/2022    8:18 AM 06/02/2021   11:21 AM 10/12/2018    1:24 PM  PHQ 2/9 Scores  PHQ - 2 Score 4 0 0 0  PHQ- 9 Score 12 5      Fall Risk    01/30/2023    9:46 AM 12/27/2022   11:29 AM 01/28/2022    8:18 AM 06/02/2021   11:21 AM 11/12/2018    1:10 PM  Fall Risk   Falls in the past year? 1 1 0 0 0  Number falls in past yr: 1 1 0 0   Injury with Fall? 0 1 0 0   Risk for fall due to : History of fall(s)  Medication side effect No Fall Risks   Follow up Falls evaluation completed  Falls evaluation completed;Education provided;Falls prevention discussed Falls evaluation completed Falls evaluation completed    FALL RISK PREVENTION PERTAINING TO THE HOME:  Any stairs in or around the home? No  Home free of loose throw rugs in walkways, pet beds, electrical cords, etc? Yes  Adequate lighting  in your home to reduce risk of falls?  Yes   ASSISTIVE DEVICES UTILIZED TO PREVENT FALLS:  Life alert? No  Use of a cane, walker or w/c? No  Grab bars in the bathroom? Yes  Shower chair or bench in shower? Yes  Elevated toilet seat or a handicapped toilet? No   TIMED UP AND GO:  Was the test performed?  No, audio visit .    Cognitive Function:        01/30/2023   10:01 AM 01/28/2022    8:25 AM  6CIT Screen  What Year? 0 points 0 points  What month? 0 points 0 points  What time? 0 points 0 points  Count back from 20 0 points 0 points  Months in reverse 0 points 0 points  Repeat phrase 2 points 6 points  Total Score 2 points 6 points    Immunizations Immunization History  Administered Date(s) Administered   Fluad Quad(high Dose 65+) 08/21/2019, 10/11/2021, 10/14/2022   Influenza, High Dose Seasonal PF 08/24/2018   PFIZER Comirnaty(Gray Top)Covid-19 Tri-Sucrose Vaccine 05/26/2020, 06/16/2020   PNEUMOCOCCAL CONJUGATE-20 09/12/2022   Pfizer Covid-19 Vaccine Bivalent Booster 41yrs & up 07/06/2021   Pneumococcal Polysaccharide-23 11/12/2018    TDAP status: Due, Education has been provided regarding the importance of this vaccine. Advised may receive this vaccine at local pharmacy or Health Dept. Aware to provide a copy of the vaccination record if obtained from local pharmacy or Health Dept. Verbalized acceptance and understanding.  Flu Vaccine status: Up to date  Pneumococcal vaccine status: Up to date  Covid-19 vaccine status: Information provided on how to obtain vaccines.   Qualifies for Shingles Vaccine? Yes   Zostavax completed No   Shingrix Completed?: No.    Education has been provided regarding the importance of this vaccine. Patient has been advised to call insurance company to determine out of pocket expense if they have not yet received this vaccine. Advised may also receive vaccine at local pharmacy or Health Dept. Verbalized acceptance and  understanding.  Screening Tests Health Maintenance  Topic Date Due   OPHTHALMOLOGY EXAM  Never done   DTaP/Tdap/Td (1 - Tdap) Never done   Medicare Annual Wellness (AWV)  01/29/2023   COVID-19 Vaccine (4 - 2023-24 season) 03/13/2023 (Originally 07/01/2022)   Zoster Vaccines- Shingrix (1 of 2) 03/13/2023 (Originally 01/19/1965)   HEMOGLOBIN A1C  03/14/2023   INFLUENZA VACCINE  06/01/2023   Diabetic kidney evaluation - Urine ACR  10/15/2023   FOOT EXAM  10/15/2023   Diabetic kidney evaluation - eGFR measurement  10/29/2023   COLONOSCOPY (Pts 45-66yrs Insurance coverage will need to be confirmed)  03/31/2029   Pneumonia Vaccine 26+ Years old  Completed   DEXA SCAN  Completed   Hepatitis C Screening  Completed   HPV VACCINES  Aged Out    Health Maintenance  Health Maintenance Due  Topic Date Due   OPHTHALMOLOGY EXAM  Never done   DTaP/Tdap/Td (1 - Tdap) Never done   Medicare Annual Wellness (AWV)  01/29/2023    Colorectal cancer screening: Type of screening: Colonoscopy. Completed 03/31/22. Repeat every 7 years  Mammogram status: Completed 09/16/22. Repeat every year  Bone Density status: will call to schedule with her mammo later this year  Lung Cancer Screening: (Low Dose CT Chest recommended if Age 73-80 years, 30 pack-year currently smoking OR have quit w/in 15years.) does not qualify.   Additional Screening:  Hepatitis C Screening: does qualify; Completed 10/12/18  Vision Screening: Recommended annual ophthalmology exams for early detection of glaucoma and other disorders of  the eye. Is the patient up to date with their annual eye exam?  Yes  Who is the provider or what is the name of the office in which the patient attends annual eye exams? Can't remember the name at this time If pt is not established with a provider, would they like to be referred to a provider to establish care? No .   Dental Screening: Recommended annual dental exams for proper oral  hygiene  Community Resource Referral / Chronic Care Management: CRR required this visit?  No   CCM required this visit?  No      Plan:     I have personally reviewed and noted the following in the patient's chart:   Medical and social history Use of alcohol, tobacco or illicit drugs  Current medications and supplements including opioid prescriptions. Patient is currently taking opioid prescriptions. Information provided to patient regarding non-opioid alternatives. Patient advised to discuss non-opioid treatment plan with their provider. Functional ability and status Nutritional status Physical activity Advanced directives List of other physicians Hospitalizations, surgeries, and ER visits in previous 12 months Vitals Screenings to include cognitive, depression, and falls Referrals and appointments  In addition, I have reviewed and discussed with patient certain preventive protocols, quality metrics, and best practice recommendations. A written personalized care plan for preventive services as well as general preventive health recommendations were provided to patient.   Due to this being a telephonic visit, the after visit summary with patients personalized plan was offered to patient via mail or my-chart.  Patient would like to access on my-chart.  Beatris Ship, [place]   01/30/2023   Nurse Notes: None

## 2023-01-30 NOTE — Patient Instructions (Signed)
[name] , Thank you for taking time to come for your Medicare Wellness Visit. I appreciate your ongoing commitment to your health goals. Please review the following plan we discussed and let me know if I can assist you in the future.     This is a list of the screening recommended for you and due dates:  Health Maintenance  Topic Date Due   Eye exam for diabetics  Never done   DTaP/Tdap/Td vaccine (1 - Tdap) Never done   COVID-19 Vaccine (4 - 2023-24 season) 03/13/2023*   Zoster (Shingles) Vaccine (1 of 2) 03/13/2023*   Hemoglobin A1C  03/14/2023   Flu Shot  06/01/2023   Yearly kidney health urinalysis for diabetes  10/15/2023   Complete foot exam   10/15/2023   Yearly kidney function blood test for diabetes  10/29/2023   Medicare Annual Wellness Visit  01/30/2024   Colon Cancer Screening  03/31/2029   Pneumonia Vaccine  Completed   DEXA scan (bone density measurement)  Completed   Hepatitis C Screening: USPSTF Recommendation to screen - Ages 17-79 yo.  Completed   HPV Vaccine  Aged Out  *Topic was postponed. The date shown is not the original due date.    Next appointment: Follow up in one year for your annual wellness visit.   Preventive Care 46 Years and Older, Female Preventive care refers to lifestyle choices and visits with your health care provider that can promote health and wellness. What does preventive care include? A yearly physical exam. This is also called an annual well check. Dental exams once or twice a year. Routine eye exams. Ask your health care provider how often you should have your eyes checked. Personal lifestyle choices, including: Daily care of your teeth and gums. Regular physical activity. Eating a healthy diet. Avoiding tobacco and drug use. Limiting alcohol use. Practicing safe sex. Taking low-dose aspirin every day. Taking vitamin and mineral supplements as recommended by your health care provider. What happens during an annual well  check? The services and screenings done by your health care provider during your annual well check will depend on your age, overall health, lifestyle risk factors, and family history of disease. Counseling  Your health care provider may ask you questions about your: Alcohol use. Tobacco use. Drug use. Emotional well-being. Home and relationship well-being. Sexual activity. Eating habits. History of falls. Memory and ability to understand (cognition). Work and work Statistician. Reproductive health. Screening  You may have the following tests or measurements: Height, weight, and BMI. Blood pressure. Lipid and cholesterol levels. These may be checked every 5 years, or more frequently if you are over 77 years old. Skin check. Lung cancer screening. You may have this screening every year starting at age 77 if you have a 30-pack-year history of smoking and currently smoke or have quit within the past 15 years. Fecal occult blood test (FOBT) of the stool. You may have this test every year starting at age 77. Flexible sigmoidoscopy or colonoscopy. You may have a sigmoidoscopy every 5 years or a colonoscopy every 10 years starting at age 77. Hepatitis C blood test. Hepatitis B blood test. Sexually transmitted disease (STD) testing. Diabetes screening. This is done by checking your blood sugar (glucose) after you have not eaten for a while (fasting). You may have this done every 1-3 years. Bone density scan. This is done to screen for osteoporosis. You may have this done starting at age 77. Mammogram. This may be done every 1-2 years.  Talk to your health care provider about how often you should have regular mammograms. Talk with your health care provider about your test results, treatment options, and if necessary, the need for more tests. Vaccines  Your health care provider may recommend certain vaccines, such as: Influenza vaccine. This is recommended every year. Tetanus, diphtheria, and  acellular pertussis (Tdap, Td) vaccine. You may need a Td booster every 10 years. Zoster vaccine. You may need this after age 77. Pneumococcal 13-valent conjugate (PCV13) vaccine. One dose is recommended after age 77. Pneumococcal polysaccharide (PPSV23) vaccine. One dose is recommended after age 77. Talk to your health care provider about which screenings and vaccines you need and how often you need them. This information is not intended to replace advice given to you by your health care provider. Make sure you discuss any questions you have with your health care provider. Document Released: 11/13/2015 Document Revised: 07/06/2016 Document Reviewed: 08/18/2015 Elsevier Interactive Patient Education  2017 [place] Prevention in the Home Falls can cause injuries. They can happen to people of all ages. There are many things you can do to make your home safe and to help prevent falls. What can I do on the outside of my home? Regularly fix the edges of walkways and driveways and fix any cracks. Remove anything that might make you trip as you walk through a door, such as a raised step or threshold. Trim any bushes or trees on the path to your home. Use bright outdoor lighting. Clear any walking paths of anything that might make someone trip, such as rocks or tools. Regularly check to see if handrails are loose or broken. Make sure that both sides of any steps have handrails. Any raised decks and porches should have guardrails on the edges. Have any leaves, snow, or ice cleared regularly. Use sand or salt on walking paths during winter. Clean up any spills in your garage right away. This includes oil or grease spills. What can I do in the bathroom? Use night lights. Install grab bars by the toilet and in the tub and shower. Do not use towel bars as grab bars. Use non-skid mats or decals in the tub or shower. If you need to sit down in the shower, use a plastic, non-slip stool. Keep  the floor dry. Clean up any water that spills on the floor as soon as it happens. Remove soap buildup in the tub or shower regularly. Attach bath mats securely with double-sided non-slip rug tape. Do not have throw rugs and other things on the floor that can make you trip. What can I do in the bedroom? Use night lights. Make sure that you have a light by your bed that is easy to reach. Do not use any sheets or blankets that are too big for your bed. They should not hang down onto the floor. Have a firm chair that has side arms. You can use this for support while you get dressed. Do not have throw rugs and other things on the floor that can make you trip. What can I do in the kitchen? Clean up any spills right away. Avoid walking on wet floors. Keep items that you use a lot in easy-to-reach places. If you need to reach something above you, use a strong step stool that has a grab bar. Keep electrical cords out of the way. Do not use floor polish or wax that makes floors slippery. If you must use wax, use non-skid floor wax.  Do not have throw rugs and other things on the floor that can make you trip. What can I do with my stairs? Do not leave any items on the stairs. Make sure that there are handrails on both sides of the stairs and use them. Fix handrails that are broken or loose. Make sure that handrails are as long as the stairways. Check any carpeting to make sure that it is firmly attached to the stairs. Fix any carpet that is loose or worn. Avoid having throw rugs at the top or bottom of the stairs. If you do have throw rugs, attach them to the floor with carpet tape. Make sure that you have a light switch at the top of the stairs and the bottom of the stairs. If you do not have them, ask someone to add them for you. What else can I do to help prevent falls? Wear shoes that: Do not have high heels. Have rubber bottoms. Are comfortable and fit you well. Are closed at the toe. Do not  wear sandals. If you use a stepladder: Make sure that it is fully opened. Do not climb a closed stepladder. Make sure that both sides of the stepladder are locked into place. Ask someone to hold it for you, if possible. Clearly mark and make sure that you can see: Any grab bars or handrails. First and last steps. Where the edge of each step is. Use tools that help you move around (mobility aids) if they are needed. These include: Canes. Walkers. Scooters. Crutches. Turn on the lights when you go into a dark area. Replace any light bulbs as soon as they burn out. Set up your furniture so you have a clear path. Avoid moving your furniture around. If any of your floors are uneven, fix them. If there are any pets around you, be aware of where they are. Review your medicines with your doctor. Some medicines can make you feel dizzy. This can increase your chance of falling. Ask your doctor what other things that you can do to help prevent falls. This information is not intended to replace advice given to you by your health care provider. Make sure you discuss any questions you have with your health care provider. Document Released: 08/13/2009 Document Revised: 03/24/2016 Document Reviewed: 11/21/2014 Elsevier Interactive Patient Education  2017 [employer].

## 2023-01-30 NOTE — Patient Instructions (Signed)
Visit Information  Thank you for taking time to visit with me today. Please don't hesitate to contact me if I can be of assistance to you.   Following are the goals we discussed today:   Goals Addressed             This Visit's Progress    Connect to community Resources       Activities and task to complete in order to accomplish goals.   I have contacted the [place] spoke with Emmit Alexanders [phone] they will contact you today to complete a CIP application to assist with preventing your power from being disconnected.  Please call if no one has called you by tomorrow morning Please continue to contact [place] to see if you are able to get an extension on your bill.        Our next appointment is by telephone on 02/09/23 at 9:30  Please call the care guide team at [phone] if you need to cancel or reschedule your appointment.   If you are experiencing a Mental Health or [place] or need someone to talk to, please call the Suicide and Crisis Lifeline: 988 call the [place] National Suicide Prevention Lifeline: 825-745-6161 or TTY: 810 048 5210 TTY 618-089-0626) to talk to a trained counselor call 1-800-273-TALK (toll free, 24 hour hotline)   Patient verbalizes understanding of instructions and care plan provided today and agrees to view in [place]. Active MyChart status and patient understanding of how to access instructions and care plan via MyChart confirmed with patient.     Casimer Lanius, [place] [phone]

## 2023-02-01 ENCOUNTER — Ambulatory Visit (INDEPENDENT_AMBULATORY_CARE_PROVIDER_SITE_OTHER): Payer: Medicare HMO | Admitting: Family Medicine

## 2023-02-01 ENCOUNTER — Encounter: Payer: Self-pay | Admitting: Family Medicine

## 2023-02-01 ENCOUNTER — Ambulatory Visit (INDEPENDENT_AMBULATORY_CARE_PROVIDER_SITE_OTHER)
Admission: RE | Admit: 2023-02-01 | Discharge: 2023-02-01 | Disposition: A | Discharge status: Home / Self Care | Payer: Medicare HMO | Source: Ambulatory Visit | Attending: Family Medicine | Admitting: Family Medicine

## 2023-02-01 VITALS — BP 108/62 | HR 85 | Temp 98.0°F | Ht 60.0 in | Wt 117.2 lb

## 2023-02-01 DIAGNOSIS — K219 Gastro-esophageal reflux disease without esophagitis: Secondary | ICD-10-CM | POA: Diagnosis not present

## 2023-02-01 DIAGNOSIS — R109 Unspecified abdominal pain: Secondary | ICD-10-CM | POA: Diagnosis not present

## 2023-02-01 MED ORDER — ESOMEPRAZOLE MAGNESIUM 40 MG PO CPDR: 40.0000 mg | DELAYED_RELEASE_CAPSULE | Freq: Every day | ORAL | 3 refills | Status: DC

## 2023-02-01 NOTE — Progress Notes (Signed)
Chief Complaint  Patient presents with   Flank Pain    Swelling     Subjective: Patient is a 77 y.o. female here for f/u.  Patient has a history of reflux.  She has been on pantoprazole, omeprazole, and currently taking rabeprazole 20 mg twice daily.  She reports compliance and no adverse effects.  She still having burning, particularly after eating, and her upper abdominal region.  It is also affecting her abdomen on the right side.  She reports her bowel movements are normal without tarry stools or bleeding.  Slight nausea without vomiting.  She is not losing weight or having fevers.  It does wake her up at night.  She saw the gastroenterology team previously with largely unremarkable scopes.  CT scan did show some moderate stool burden.  Of note, she does remember her bowel movements were normal back then.  She is urinating without issue and denies any bleeding.  She has no personal history of kidney stones.  She does have a family history of kidney stones and many family members. She does not have a gallbladder or appendix.   Past Medical History:  Diagnosis Date   Anxiety    Breast cancer 01/2021   left breast IDC   Complication of anesthesia    Family history of breast cancer 12/16/2020   Family history of gastric cancer 12/16/2020   Fibromyalgia    GERD (gastroesophageal reflux disease)    Hyperlipidemia    Hypothyroidism    Osteoarthritis    knees, hands, fingers   Osteoarthritis    PONV (postoperative nausea and vomiting)    Skin cancer     Objective: BP 108/62 (BP Location: Left Arm, Patient Position: Sitting, Cuff Size: Normal)   Pulse 85   Temp 98 F (36.7 C) (Oral)   Ht 5' (1.524 m)   Wt 117 lb 4 oz (53.2 kg)   SpO2 98%   BMI 22.90 kg/m  General: Awake, appears stated age Heart: RRR, no LE edema Lungs: CTAB, no rales, wheezes or rhonchi. No accessory muscle use Abd: BS+, S, ttp over RUQ and epigastric region, prominence in RLQ. Neg Lloyd's, Murphy's, Rovsing's,  McBurney's Psych: Age appropriate judgment and insight, normal affect and mood  Assessment and Plan: Gastroesophageal reflux disease, unspecified whether esophagitis present - Plan: Urine Microscopic Only, DG Abd 2 Views  Chronic, uncontrolled. [facility] # provided as she will need to see them again. Change Aciphex to Nexium 40 mg/d. Consider adding H2 blocker. Ck flat plate to r/o stool burden/gas/stone. Ck urine.  The patient voiced understanding and agreement to the plan.  [place], DO 02/01/23  1:02 PM

## 2023-02-01 NOTE — Patient Instructions (Signed)
Please contact the GI team at: [phone]  Please get your X-ray done in the basement of our [place] office located on: [address], [place] [address]  You do not need an appointment for that location.   We will be in touch regarding your urine results.   Let us know if you need anything.

## 2023-02-02 ENCOUNTER — Encounter: Payer: Self-pay | Admitting: Family Medicine

## 2023-02-02 LAB — URINALYSIS, MICROSCOPIC ONLY: RBC / HPF: NONE SEEN (ref 0–?)

## 2023-02-09 ENCOUNTER — Ambulatory Visit: Payer: Self-pay | Admitting: Licensed Clinical Social Worker

## 2023-02-09 ENCOUNTER — Other Ambulatory Visit: Payer: Self-pay | Admitting: Family Medicine

## 2023-02-09 DIAGNOSIS — M797 Fibromyalgia: Secondary | ICD-10-CM

## 2023-02-09 NOTE — Patient Outreach (Signed)
  Care Coordination  Follow Up Visit Note   02/09/2023 Name: Gina Hunt MRN: [id number] DOB: 1946-09-27  Gina Hunt is a 77 y.o. year old female who sees Gina Hunt, Gina Roche, DO for primary care. I spoke with  Gina Hunt by phone today.  What matters to the patients health and wellness today?  Getting power bill paid    Goals Addressed             This Visit's Progress    COMPLETED: Connect to community Resources       Activities and task to complete in order to accomplish goals.   I am sorry you did not qualify for the CIP energy program at DSS Please continue to contact [employer] to see if you are able to get an extension on your bill and make pay arrangements .        SDOH assessments and interventions completed:  No   Care Coordination Interventions:  Yes, provided  Interventions Today    Flowsheet Row Most Recent Value  Chronic Disease   Chronic disease during today's visit Hypertension (HTN), Diabetes  General Interventions   General Interventions Discussed/Reviewed [employer] needs related to power bill]  Mental Health Interventions   Mental Health Discussed/Reviewed Mental Health Reviewed  [denied any needs or support]       Follow up plan: No further intervention required. You declined further support and will call if needs arise in the future  Encounter Outcome:  Pt. Visit Completed   Gina Hines, LCSW Social Work Care Coordination  [facility] Gina Hunt [employer] [phone]

## 2023-02-09 NOTE — Patient Instructions (Signed)
Visit Information  Thank you for taking time to visit with me today. Please don't hesitate to contact me if I can be of assistance to you.   Following are the goals we discussed today:   Goals Addressed             This Visit's Progress    COMPLETED: Connect to [employer]       Activities and task to complete in order to accomplish goals.   I am sorry you did not qualify for the CIP energy program at DSS Please continue to contact [employer] to see if you are able to get an extension on your bill and make pay arrangements .         Please call the care guide team at [phone] if you need to cancel or reschedule your appointment.   If you are experiencing a Mental Health or Behavioral Health Crisis or need someone to talk to, please call the Suicide and Crisis Lifeline: 988 call the [place] National Suicide Prevention Lifeline: (423)629-8298 or TTY: 816-194-6416 TTY (647)092-9618) to talk to a trained counselor call 1-800-273-TALK (toll free, 24 hour hotline)   Patient verbalizes understanding of instructions and care plan provided today and agrees to view in MyChart. Active MyChart status and patient understanding of how to access instructions and care plan via MyChart confirmed with patient.     No further follow up required: by Social Work at this time  Sammuel Hines, [employer] Social Work Care Coordination  [employer] Emmie Niemann [employer] [phone]

## 2023-02-15 ENCOUNTER — Telehealth: Payer: Self-pay | Admitting: Family Medicine

## 2023-02-15 ENCOUNTER — Other Ambulatory Visit: Payer: Self-pay | Admitting: General Surgery

## 2023-02-15 DIAGNOSIS — Z9889 Other specified postprocedural states: Secondary | ICD-10-CM | POA: Diagnosis not present

## 2023-02-15 DIAGNOSIS — K219 Gastro-esophageal reflux disease without esophagitis: Secondary | ICD-10-CM | POA: Diagnosis not present

## 2023-02-15 DIAGNOSIS — K449 Diaphragmatic hernia without obstruction or gangrene: Secondary | ICD-10-CM

## 2023-02-15 DIAGNOSIS — R1031 Right lower quadrant pain: Secondary | ICD-10-CM

## 2023-02-15 DIAGNOSIS — R0602 Shortness of breath: Secondary | ICD-10-CM

## 2023-02-15 DIAGNOSIS — Z853 Personal history of malignant neoplasm of breast: Secondary | ICD-10-CM | POA: Diagnosis not present

## 2023-02-15 NOTE — Telephone Encounter (Signed)
Called the patient back after talking to her to encourage to call 911 as patient is having breathing problems and having a lot of pain in her chest she said.  All of her family and friends have encouraged her to do so as well.  She promised that she would consider it and that if she worsened at all for sure she would call them. Informed her that PCP had asked me to call her back and tell her to call them.

## 2023-02-16 ENCOUNTER — Emergency Department (HOSPITAL_BASED_OUTPATIENT_CLINIC_OR_DEPARTMENT_OTHER): Payer: Medicare HMO

## 2023-02-16 ENCOUNTER — Ambulatory Visit (INDEPENDENT_AMBULATORY_CARE_PROVIDER_SITE_OTHER): Payer: Medicare HMO | Admitting: Family Medicine

## 2023-02-16 ENCOUNTER — Encounter: Payer: Self-pay | Admitting: Family Medicine

## 2023-02-16 ENCOUNTER — Inpatient Hospital Stay (HOSPITAL_BASED_OUTPATIENT_CLINIC_OR_DEPARTMENT_OTHER)
Admission: EM | Admit: 2023-02-16 | Discharge: 2023-02-23 | DRG: 597 | Disposition: A | Discharge status: Home Health | Payer: Medicare HMO | Source: Ambulatory Visit | Attending: Internal Medicine | Admitting: Internal Medicine

## 2023-02-16 ENCOUNTER — Other Ambulatory Visit: Payer: Self-pay

## 2023-02-16 ENCOUNTER — Encounter (HOSPITAL_BASED_OUTPATIENT_CLINIC_OR_DEPARTMENT_OTHER): Payer: Self-pay | Admitting: Urology

## 2023-02-16 VITALS — BP 146/91 | HR 85 | Ht 60.0 in | Wt 115.0 lb

## 2023-02-16 DIAGNOSIS — Z833 Family history of diabetes mellitus: Secondary | ICD-10-CM

## 2023-02-16 DIAGNOSIS — Z79810 Long term (current) use of selective estrogen receptor modulators (SERMs): Secondary | ICD-10-CM

## 2023-02-16 DIAGNOSIS — Z803 Family history of malignant neoplasm of breast: Secondary | ICD-10-CM

## 2023-02-16 DIAGNOSIS — E1165 Type 2 diabetes mellitus with hyperglycemia: Secondary | ICD-10-CM | POA: Diagnosis present

## 2023-02-16 DIAGNOSIS — C50412 Malignant neoplasm of upper-outer quadrant of left female breast: Secondary | ICD-10-CM | POA: Diagnosis present

## 2023-02-16 DIAGNOSIS — Z9012 Acquired absence of left breast and nipple: Secondary | ICD-10-CM

## 2023-02-16 DIAGNOSIS — J90 Pleural effusion, not elsewhere classified: Secondary | ICD-10-CM | POA: Diagnosis not present

## 2023-02-16 DIAGNOSIS — R0902 Hypoxemia: Secondary | ICD-10-CM

## 2023-02-16 DIAGNOSIS — Z800 Family history of malignant neoplasm of digestive organs: Secondary | ICD-10-CM

## 2023-02-16 DIAGNOSIS — Z1152 Encounter for screening for COVID-19: Secondary | ICD-10-CM | POA: Diagnosis not present

## 2023-02-16 DIAGNOSIS — F39 Unspecified mood [affective] disorder: Secondary | ICD-10-CM | POA: Diagnosis present

## 2023-02-16 DIAGNOSIS — R0600 Dyspnea, unspecified: Secondary | ICD-10-CM | POA: Diagnosis not present

## 2023-02-16 DIAGNOSIS — E039 Hypothyroidism, unspecified: Secondary | ICD-10-CM | POA: Diagnosis present

## 2023-02-16 DIAGNOSIS — R079 Chest pain, unspecified: Secondary | ICD-10-CM | POA: Diagnosis not present

## 2023-02-16 DIAGNOSIS — K219 Gastro-esophageal reflux disease without esophagitis: Secondary | ICD-10-CM | POA: Diagnosis present

## 2023-02-16 DIAGNOSIS — Z90710 Acquired absence of both cervix and uterus: Secondary | ICD-10-CM

## 2023-02-16 DIAGNOSIS — Z8261 Family history of arthritis: Secondary | ICD-10-CM

## 2023-02-16 DIAGNOSIS — Z79890 Hormone replacement therapy (postmenopausal): Secondary | ICD-10-CM

## 2023-02-16 DIAGNOSIS — Z170 Estrogen receptor positive status [ER+]: Secondary | ICD-10-CM

## 2023-02-16 DIAGNOSIS — Z8249 Family history of ischemic heart disease and other diseases of the circulatory system: Secondary | ICD-10-CM

## 2023-02-16 DIAGNOSIS — R9389 Abnormal findings on diagnostic imaging of other specified body structures: Secondary | ICD-10-CM | POA: Diagnosis present

## 2023-02-16 DIAGNOSIS — R918 Other nonspecific abnormal finding of lung field: Secondary | ICD-10-CM | POA: Diagnosis present

## 2023-02-16 DIAGNOSIS — R54 Age-related physical debility: Secondary | ICD-10-CM | POA: Diagnosis present

## 2023-02-16 DIAGNOSIS — Z853 Personal history of malignant neoplasm of breast: Secondary | ICD-10-CM

## 2023-02-16 DIAGNOSIS — K449 Diaphragmatic hernia without obstruction or gangrene: Secondary | ICD-10-CM | POA: Diagnosis present

## 2023-02-16 DIAGNOSIS — Z808 Family history of malignant neoplasm of other organs or systems: Secondary | ICD-10-CM

## 2023-02-16 DIAGNOSIS — C50911 Malignant neoplasm of unspecified site of right female breast: Secondary | ICD-10-CM

## 2023-02-16 DIAGNOSIS — Z79899 Other long term (current) drug therapy: Secondary | ICD-10-CM

## 2023-02-16 DIAGNOSIS — J9811 Atelectasis: Secondary | ICD-10-CM | POA: Diagnosis not present

## 2023-02-16 DIAGNOSIS — R64 Cachexia: Secondary | ICD-10-CM | POA: Diagnosis present

## 2023-02-16 DIAGNOSIS — R0602 Shortness of breath: Secondary | ICD-10-CM | POA: Diagnosis present

## 2023-02-16 DIAGNOSIS — N644 Mastodynia: Secondary | ICD-10-CM | POA: Diagnosis present

## 2023-02-16 DIAGNOSIS — E785 Hyperlipidemia, unspecified: Secondary | ICD-10-CM | POA: Diagnosis present

## 2023-02-16 DIAGNOSIS — Z6822 Body mass index (BMI) 22.0-22.9, adult: Secondary | ICD-10-CM | POA: Diagnosis not present

## 2023-02-16 DIAGNOSIS — C801 Malignant (primary) neoplasm, unspecified: Secondary | ICD-10-CM | POA: Diagnosis not present

## 2023-02-16 DIAGNOSIS — J910 Malignant pleural effusion: Secondary | ICD-10-CM | POA: Diagnosis present

## 2023-02-16 DIAGNOSIS — Z85828 Personal history of other malignant neoplasm of skin: Secondary | ICD-10-CM

## 2023-02-16 DIAGNOSIS — Z9221 Personal history of antineoplastic chemotherapy: Secondary | ICD-10-CM

## 2023-02-16 DIAGNOSIS — C7951 Secondary malignant neoplasm of bone: Secondary | ICD-10-CM | POA: Diagnosis present

## 2023-02-16 DIAGNOSIS — I10 Essential (primary) hypertension: Secondary | ICD-10-CM | POA: Diagnosis present

## 2023-02-16 DIAGNOSIS — J9601 Acute respiratory failure with hypoxia: Secondary | ICD-10-CM | POA: Diagnosis present

## 2023-02-16 DIAGNOSIS — Z5986 Financial insecurity: Secondary | ICD-10-CM

## 2023-02-16 DIAGNOSIS — Z66 Do not resuscitate: Secondary | ICD-10-CM | POA: Diagnosis present

## 2023-02-16 DIAGNOSIS — Z885 Allergy status to narcotic agent status: Secondary | ICD-10-CM

## 2023-02-16 DIAGNOSIS — M797 Fibromyalgia: Secondary | ICD-10-CM | POA: Diagnosis present

## 2023-02-16 DIAGNOSIS — E440 Moderate protein-calorie malnutrition: Secondary | ICD-10-CM | POA: Diagnosis present

## 2023-02-16 DIAGNOSIS — Q248 Other specified congenital malformations of heart: Secondary | ICD-10-CM

## 2023-02-16 LAB — COMPREHENSIVE METABOLIC PANEL
ALT: 16 U/L (ref 0–44)
AST: 25 U/L (ref 15–41)
Albumin: 3.1 g/dL — ABNORMAL LOW (ref 3.5–5.0)
Alkaline Phosphatase: 81 U/L (ref 38–126)
Anion gap: 9 (ref 5–15)
BUN: 11 mg/dL (ref 8–23)
CO2: 24 mmol/L (ref 22–32)
Calcium: 8.6 mg/dL — ABNORMAL LOW (ref 8.9–10.3)
Chloride: 103 mmol/L (ref 98–111)
Creatinine, Ser: 0.71 mg/dL (ref 0.44–1.00)
GFR, Estimated: >60 mL/min (ref >60)
Glucose, Bld: 195 mg/dL — ABNORMAL HIGH (ref 70–99)
Potassium: 4.4 mmol/L (ref 3.5–5.1)
Sodium: 136 mmol/L (ref 135–145)
Total Bilirubin: 0.4 mg/dL (ref 0.3–1.2)
Total Protein: 7.5 g/dL (ref 6.5–8.1)

## 2023-02-16 LAB — SARS CORONAVIRUS 2 BY RT PCR: SARS Coronavirus 2 by RT PCR: NEGATIVE

## 2023-02-16 LAB — CBC WITH DIFFERENTIAL/PLATELET
Abs Immature Granulocytes: 0.04 10*3/uL (ref 0.00–0.07)
Basophils Absolute: 0.1 10*3/uL (ref 0.0–0.1)
Basophils Relative: 1 %
Eosinophils Absolute: 0.2 10*3/uL (ref 0.0–0.5)
Eosinophils Relative: 2 %
HCT: 45.1 % (ref 36.0–46.0)
Hemoglobin: 14.9 g/dL (ref 12.0–15.0)
Immature Granulocytes: 0 %
Lymphocytes Relative: 14 %
Lymphs Abs: 1.4 10*3/uL (ref 0.7–4.0)
MCH: 31.6 pg (ref 26.0–34.0)
MCHC: 33 g/dL (ref 30.0–36.0)
MCV: 95.8 fL (ref 80.0–100.0)
Monocytes Absolute: 0.9 10*3/uL (ref 0.1–1.0)
Monocytes Relative: 9 %
Neutro Abs: 7.4 10*3/uL (ref 1.7–7.7)
Neutrophils Relative %: 74 %
Platelets: 401 10*3/uL — ABNORMAL HIGH (ref 150–400)
RBC: 4.71 MIL/uL (ref 3.87–5.11)
RDW: 12.9 % (ref 11.5–15.5)
WBC: 9.9 10*3/uL (ref 4.0–10.5)
nRBC: 0 % (ref 0.0–0.2)

## 2023-02-16 LAB — D-DIMER, QUANTITATIVE: D-Dimer, Quant: 2.04 ug/mL-FEU — ABNORMAL HIGH (ref 0.00–0.50)

## 2023-02-16 LAB — TROPONIN I (HIGH SENSITIVITY): Troponin I (High Sensitivity): 5 ng/L (ref <18)

## 2023-02-16 MED ORDER — IOHEXOL 350 MG/ML SOLN
75.0000 mL | Freq: Once | INTRAVENOUS | Status: AC | PRN
  Administered 2023-02-16: 75 mL via INTRAVENOUS

## 2023-02-16 MED ORDER — IPRATROPIUM-ALBUTEROL 0.5-2.5 (3) MG/3ML IN SOLN
3.0000 mL | Freq: Once | RESPIRATORY_TRACT | Status: AC
  Administered 2023-02-16: 3 mL via RESPIRATORY_TRACT
  Filled 2023-02-16: qty 3

## 2023-02-16 MED ORDER — ALBUTEROL SULFATE HFA 108 (90 BASE) MCG/ACT IN AERS
2.0000 | INHALATION_SPRAY | RESPIRATORY_TRACT | Status: DC | PRN
  Administered 2023-02-16: 2 via RESPIRATORY_TRACT
  Filled 2023-02-16: qty 6.7

## 2023-02-16 NOTE — ED Provider Notes (Signed)
[facility] Provider Note   CSN: [id number] Arrival date & time: 02/16/23  1521     History  Chief Complaint  Patient presents with   Shortness of Breath    Gina Hunt is a 77 y.o. female, history of breast cancer in remission, hypertension, diabetes, hyperlipidemia, who presents to the ED secondary to shortness of breath for 3 weeks, that came on acutely and is progressively gotten worse as well as chest pain for the last week.  She states the chest pain feels like elephant sitting on her chest, today was a 4 out of 10.  Was told to come to the ER by her primary care doctor as she has been having these issues for several weeks.  States that she has been having difficulty speaking full sentences and doing her daily activities, took her 5 hours to mow her lawn the typical hour that typically takes her.  She denies any nausea, vomiting, abdominal pain.    Home Medications Prior to Admission medications   Medication Sig Start Date End Date Taking? Authorizing Provider  cycloSPORINE (RESTASIS) 0.05 % ophthalmic emulsion Place 1 drop into both eyes 2 (two) times daily. 10/11/21   Gina Hunt, Gina Hue, MD  dicyclomine (BENTYL) 10 MG capsule Take 1 capsule (10 mg total) by mouth every 6 (six) hours as needed for spasms. 10/26/21   Gina Hunt, Gina Hunt, Gina Hunt  DULoxetine (CYMBALTA) 60 MG capsule TAKE 1 CAPSULE TWICE DAILY 02/09/23   Gina Dory, Gina Hunt  esomeprazole (NEXIUM) 40 MG capsule Take 1 capsule (40 mg total) by mouth daily. 02/01/23   Gina Dory, Gina Hunt  gabapentin (NEURONTIN) 300 MG capsule Take 1 capsule (300 mg total) by mouth 3 (three) times daily. 11/23/21   Gina Dory, Gina Hunt  levothyroxine (SYNTHROID) 50 MCG tablet TAKE 1 TABLET EVERY DAY BEFORE BREAKFAST 10/21/22   Gina Hunt, Gina Roche, Gina Hunt  olmesartan (BENICAR) 20 MG tablet Take 1 tablet (20 mg total) by mouth daily. 10/14/22   Gina Dory, Gina Hunt   simvastatin (ZOCOR) 40 MG tablet TAKE 1 TABLET EVERY DAY 07/06/22   Gina Dory, Gina Hunt  tamoxifen (NOLVADEX) 20 MG tablet TAKE 1 TABLET EVERY DAY 10/03/22   Gina Moulds, MD  traMADol (ULTRAM) 50 MG tablet tramadol 50 mg tablet  TAKE 1 TABLET BY MOUTH EVERY 6 HOURS AS NEEDED    [provider]      Allergies    Codeine and Morphine and related    Review of Systems   Review of Systems  Constitutional:  Negative for fever.  Respiratory:  Positive for shortness of breath.   Cardiovascular:  Positive for chest pain.    Physical Exam Updated Vital Signs BP (!) 131/92   Pulse 97   Temp 97.7 F (36.5 C) (Oral)   Resp (!) 21   Ht 5' (1.524 m)   Wt 52.2 kg   SpO2 98%   BMI 22.48 kg/m  Physical Exam Vitals and nursing note reviewed.  Constitutional:      General: She is not in acute distress.    Appearance: She is well-developed.  HENT:     Head: Normocephalic and atraumatic.  Eyes:     Conjunctiva/sclera: Conjunctivae normal.  Cardiovascular:     Rate and Rhythm: Normal rate and regular rhythm.     Heart sounds: No murmur heard. Pulmonary:     Effort: Tachypnea, accessory muscle usage and respiratory distress present.     Breath sounds:  Examination of the left-upper field reveals rales. Examination of the left-lower field reveals rales. Rales present.     Comments: 2 L O2, increased respiratory rate, Abdominal:     Palpations: Abdomen is soft.     Tenderness: There is no abdominal tenderness.  Musculoskeletal:        General: No swelling.     Cervical back: Neck supple.  Skin:    General: Skin is warm and dry.     Capillary Refill: Capillary refill takes less than 2 seconds.  Neurological:     Mental Status: She is alert.  Psychiatric:        Mood and Affect: Mood normal.     ED Results / Procedures / Treatments   Labs (all labs ordered are listed, but only abnormal results are displayed) Labs Reviewed  CBC WITH DIFFERENTIAL/PLATELET -  Abnormal; Notable for the following components:      Result Value   Platelets 401 (*)    All other components within normal limits  COMPREHENSIVE METABOLIC PANEL - Abnormal; Notable for the following components:   Glucose, Bld 195 (*)    Calcium 8.6 (*)    Albumin 3.1 (*)    All other components within normal limits  D-DIMER, QUANTITATIVE - Abnormal; Notable for the following components:   D-Dimer, Quant 2.04 (*)    All other components within normal limits  TROPONIN I (HIGH SENSITIVITY)  TROPONIN I (HIGH SENSITIVITY)    EKG EKG Interpretation  Date/Time:  Thursday February 16 2023 15:36:34 EDT Ventricular Rate:  94 PR Interval:  124 QRS Duration: 68 QT Interval:  376 QTC Calculation: 471 R Axis:   15 Text Interpretation: Sinus rhythm Anterior infarct, old No old tracing to compare Confirmed by Gina Hunt [phone]) on 02/16/2023 3:42:13 PM  Radiology CT Angio Chest PE W and/or Wo Contrast  Result Date: 02/16/2023 CLINICAL DATA:  Shortness of breath for 3 weeks. EXAM: CT ANGIOGRAPHY CHEST WITH CONTRAST TECHNIQUE: Multidetector CT imaging of the chest was performed using the standard protocol during bolus administration of intravenous contrast. Multiplanar CT image reconstructions and MIPs were obtained to evaluate the vascular anatomy. RADIATION DOSE REDUCTION: This exam was performed according to the departmental dose-optimization program which includes automated exposure control, adjustment of the mA and/or kV according to patient size and/or use of iterative reconstruction technique. CONTRAST:  75mL OMNIPAQUE IOHEXOL 350 MG/ML SOLN COMPARISON:  Chest x-ray 02/16/2023 earlier. CT abdomen pelvis 01/13/2022 FINDINGS: Cardiovascular: The heart is nonenlarged. Trace pericardial fluid. Coronary artery calcifications are seen. The thoracic aorta has some mild atherosclerotic plaque. There is significant breathing motion identified. This limits evaluation of Gina Hunt and peripheral emboli,  nondiagnostic for such Gina Hunt foci. No segmental or larger pulmonary embolism identified. Mediastinum/Nodes: Severe shift of the mediastinum from right to left. Mildly patulous esophagus. Moderate hiatal hernia. No abnormal lymph node enlargement identified in the axillary regions, left hilum. There are however areas of presumed abnormal nodes in the mediastinum such as series 7, image 179 measuring 2.2 by 1.7 cm. This also could be a soft tissue mass. Additional nodule seen posterior to the sternum inferiorly. Additional other abnormal anterior cardiophrenic nodes. Lungs/Pleura: Complete opacification of the right hemithorax with collapse of the right lung. Very large right-sided pleural effusion with areas of pleural thickening and nodularity. Example posteriorly on series 5, image 85 measuring up to 9 mm in thickness. There is suggestion of the central mass the right lung hilum with the occlusion of bronchi. This areas difficult to define  in detail with the level of shift in mass effect. Tissues in this location are distorted. The left lung has several Taiyo Kozma lung nodules. Example lingula series 5, image 45 measuring 5 mm, left lower lobe series 5, image 68 measuring 13 mm. Several others. Upper Abdomen: Slight nodular contours of the liver. Musculoskeletal: Curvature and degenerative changes along the spine. There are mixed lucent and sclerotic bone lesions identified in the spine for example at T11, T12, L1-L2. Bone metastases are possible. Left-sided mastectomy with axillary surgical clips. Review of the MIP images confirms the above findings. IMPRESSION: Findings worrisome for malignancy. Nodular areas of pleural thickening along the right hemithorax with a very large right pleural effusion causing mass effect with severe shift of the mediastinum from right to left and collapse of the right lung. Distortion of the tissues in the right hilum, possible mass, but incompletely defined on this examination. Several  left-sided lung nodules, mediastinal lymph nodes. Mixed lucent sclerotic bone lesions identified along the spine at the thoracolumbar junction. Possible metastatic disease of the bone. Additional workup with whole-body bone scan as clinically appropriate. Overall, there may be benefit to a follow-up CT scan with contrast after thoracentesis to decrease the lung distortion and improved visualization of the hilum and mediastinum. Significant breathing motion. No segmental or larger pulmonary embolism. Aortic Atherosclerosis (ICD10-I70.0). Electronically Signed   By: Karen Kays M.D.   On: 02/16/2023 17:29   DG Chest 2 View  Result Date: 02/16/2023 CLINICAL DATA:  SOB EXAM: CHEST - 2 VIEW COMPARISON:  08/30/2021 FINDINGS: Right hemithorax completely opacified. No pneumothorax identified. Vascular congestion identified on the left. There is calcification of the aorta. Diana Davenport hiatal hernia. IMPRESSION: Right hemithorax completely opacified, a new finding. Electronically Signed   By: Layla Maw M.D.   On: 02/16/2023 16:19    Procedures Procedures    Medications Ordered in ED Medications  albuterol (VENTOLIN HFA) 108 (90 Base) MCG/ACT inhaler 2 puff (has no administration in time range)  ipratropium-albuterol (DUONEB) 0.5-2.5 (3) MG/3ML nebulizer solution 3 mL (3 mLs Nebulization Given 02/16/23 1609)  iohexol (OMNIPAQUE) 350 MG/ML injection 75 mL (75 mLs Intravenous Contrast Given 02/16/23 1704)    ED Course/ Medical Decision Making/ A&P                             Medical Decision Making Patient is a 77 year old female, here for shortness of breath, this been going on for the last 3 weeks, she is found to be hypoxic 77% on room air, and placed on 2 L O2, then bumped up to 4 L.  She has coarse breath sounds on the left side, but no breath sounds on the right side.  She appears to be in somewhat of the distal respiratory distress.  We will obtain dimer, troponins, chest x-ray for further  evaluation.  She denies any infectious symptoms to support pneumonia.  Amount and/or Complexity of Data Reviewed Labs: ordered.    Details: Elevated D-dimer 2.04, normal troponin, no leukocytosis Radiology: ordered.    Details: Chest x-ray shows right hemithorax.  CTA concerning for malignancy, possible hilar mass, large right pleural effusion with mediastinal shift Discussion of management or test interpretation with external provider(s): Patient requiring oxygen, concern for possible metastatic cancer, will need thoracentesis, will admit for thoracentesis, oncology consult, and PET scan.  Possible for supplementation of O2, she is no longer in respiratory distress after being bumped up to 4 L O2, and  is breathing better.  I informed her of the concerns for possible malignancy, and she will require further evaluation.  Spoke with Dr. Georjean Mode, hospitalist, and she accepts admission of patient.  Risk Prescription drug management. Decision regarding hospitalization.    Final Clinical Impression(s) / ED Diagnoses Final diagnoses:  Heart predominantly in right hemithorax  Hypoxia  Pleural effusion on right  Mediastinal shift    Rx / DC Orders ED Discharge Orders     None         Pete Pelt, PA 02/16/23 Roderick Pee, MD 02/17/23 1517

## 2023-02-16 NOTE — Progress Notes (Addendum)
Acute Office Visit  Subjective:     Patient ID: Gina Hunt, female    DOB: Dec 12, 1945, 77 y.o.   MRN: [id number]  Chief Complaint  Patient presents with   Shortness of Breath     Patient is in today for dyspnea and chest pain.   Patient reports that about 3 weeks ago she got home from church on Sunday night and suddenly felt like she could not breathe.  She denies feeling sick prior or having any sick contacts.  Reports the dyspnea has continued pretty regularly since then and she has had frequent episodes of chest pain as well.  Reports chest pain will come and go lasting for 3 to 4 minutes at a time and then resolving for short while.  Reports that the chest pain will feel like an elephant is sitting on her chest or like she has food that she cannot get all the way down.  Today chest pain is currently 4-5/10, but it can get significantly worse at times.  She denies any radiation into her arm and jaw, no diaphoresis.  States it can be uncomfortable to take a deep breath but not extremely painful.  She does get occasional lightheadedness.  She denies any fevers, chills, cough, body aches, urinary symptoms.  Our office has spoken with her multiple times over the past 2 days encouraging that she go to the ED for these symptoms, however she states she did not want to go to the emergency department so she scheduled this appointment.       All review of systems negative except what is listed in the HPI      Objective:    BP (!) 146/91   Pulse 85   Ht 5' (1.524 m)   Wt 115 lb (52.2 kg)   SpO2 95%   BMI 22.46 kg/m    Physical Exam Vitals reviewed.  Constitutional:      Appearance: Normal appearance. She is well-developed.  HENT:     Head: Normocephalic and atraumatic.  Cardiovascular:     Rate and Rhythm: Normal rate and regular rhythm.     Pulses: Normal pulses.     Heart sounds: Normal heart sounds.  Pulmonary:     Effort: Pulmonary effort is normal.     Breath  sounds: No stridor.     Comments: labored breathing, pausing every few words to take a breath; severely diminished breath sounds on right side, left side slightly course Skin:    General: Skin is warm and dry.  Neurological:     Mental Status: She is alert and oriented to person, place, and time.  Psychiatric:        Mood and Affect: Mood normal.        Behavior: Behavior normal.        Thought Content: Thought content normal.        Judgment: Judgment normal.     No results found for any visits on 02/16/23.      Assessment & Plan:   Problem List Items Addressed This Visit   None Visit Diagnoses     Chest pain, unspecified type    -  Primary   Relevant Orders   EKG 12-Lead (Completed)   Dyspnea, unspecified type       Relevant Orders   EKG 12-Lead (Completed)       EKG: SR 94 bpm, possible anteroseptal infarct undetermined age, no ST elevation   Based on presentation and lung sounds, strongly recommend  ED evaluation. Patient hesitant but agreeable. CMA to escort her downstairs to [facility] ED.  VSS other than mildly elevated BP at this time. Report called.      No orders of the defined types were placed in this encounter.   Return for ED follow-up after discharge .  Clayborne Dana, NP

## 2023-02-16 NOTE — Telephone Encounter (Signed)
Called to check patient status.  She stated that she is feels just a little better but she still a little short of breath.  She spoke with her niece yesterday and she advised her that it may be her allergies and to take some Claritin.  I advised her that she should still be seen in the ER versus here because they can get scans and tests done faster.  Also advise to have someone to bring her if possible.  She did still decline to go to ER.  Advised that if she gets worse before appointment to not wait for appointment and call 911 right away.  Since she will be possibly driving herself advise that she needs to stop immediately and call 911 if she starts to feel bad and get real bad short of breath if she is able. Also advised Ladona Ridgel that patient is planning to come in.

## 2023-02-16 NOTE — ED Triage Notes (Signed)
Pt brought down from upstairs with abnormal EKG and SOB  Pt states SOB started 3 weeks ago  Denies fever On 2L [place] from pcp office, does not usually use O2 at home   [place], RT at bedside Left base wheezing and course

## 2023-02-17 ENCOUNTER — Inpatient Hospital Stay (HOSPITAL_COMMUNITY): Payer: Medicare HMO

## 2023-02-17 ENCOUNTER — Encounter (HOSPITAL_COMMUNITY): Payer: Self-pay | Admitting: Internal Medicine

## 2023-02-17 DIAGNOSIS — Z6822 Body mass index (BMI) 22.0-22.9, adult: Secondary | ICD-10-CM | POA: Diagnosis not present

## 2023-02-17 DIAGNOSIS — E785 Hyperlipidemia, unspecified: Secondary | ICD-10-CM | POA: Diagnosis present

## 2023-02-17 DIAGNOSIS — E1165 Type 2 diabetes mellitus with hyperglycemia: Secondary | ICD-10-CM | POA: Diagnosis present

## 2023-02-17 DIAGNOSIS — R54 Age-related physical debility: Secondary | ICD-10-CM | POA: Diagnosis present

## 2023-02-17 DIAGNOSIS — K219 Gastro-esophageal reflux disease without esophagitis: Secondary | ICD-10-CM | POA: Diagnosis present

## 2023-02-17 DIAGNOSIS — Z79890 Hormone replacement therapy (postmenopausal): Secondary | ICD-10-CM | POA: Diagnosis not present

## 2023-02-17 DIAGNOSIS — J910 Malignant pleural effusion: Secondary | ICD-10-CM | POA: Diagnosis present

## 2023-02-17 DIAGNOSIS — Z1152 Encounter for screening for COVID-19: Secondary | ICD-10-CM | POA: Diagnosis not present

## 2023-02-17 DIAGNOSIS — K449 Diaphragmatic hernia without obstruction or gangrene: Secondary | ICD-10-CM | POA: Diagnosis present

## 2023-02-17 DIAGNOSIS — E440 Moderate protein-calorie malnutrition: Secondary | ICD-10-CM | POA: Diagnosis present

## 2023-02-17 DIAGNOSIS — R918 Other nonspecific abnormal finding of lung field: Secondary | ICD-10-CM | POA: Diagnosis present

## 2023-02-17 DIAGNOSIS — Z79810 Long term (current) use of selective estrogen receptor modulators (SERMs): Secondary | ICD-10-CM | POA: Diagnosis not present

## 2023-02-17 DIAGNOSIS — F39 Unspecified mood [affective] disorder: Secondary | ICD-10-CM | POA: Diagnosis present

## 2023-02-17 DIAGNOSIS — J90 Pleural effusion, not elsewhere classified: Secondary | ICD-10-CM | POA: Diagnosis present

## 2023-02-17 DIAGNOSIS — R9389 Abnormal findings on diagnostic imaging of other specified body structures: Secondary | ICD-10-CM | POA: Diagnosis present

## 2023-02-17 DIAGNOSIS — Z66 Do not resuscitate: Secondary | ICD-10-CM | POA: Diagnosis present

## 2023-02-17 DIAGNOSIS — J9601 Acute respiratory failure with hypoxia: Secondary | ICD-10-CM | POA: Diagnosis present

## 2023-02-17 DIAGNOSIS — M797 Fibromyalgia: Secondary | ICD-10-CM | POA: Diagnosis present

## 2023-02-17 DIAGNOSIS — C7951 Secondary malignant neoplasm of bone: Secondary | ICD-10-CM | POA: Diagnosis present

## 2023-02-17 DIAGNOSIS — C50412 Malignant neoplasm of upper-outer quadrant of left female breast: Secondary | ICD-10-CM | POA: Diagnosis present

## 2023-02-17 DIAGNOSIS — E039 Hypothyroidism, unspecified: Secondary | ICD-10-CM | POA: Diagnosis present

## 2023-02-17 DIAGNOSIS — I10 Essential (primary) hypertension: Secondary | ICD-10-CM | POA: Diagnosis present

## 2023-02-17 DIAGNOSIS — N644 Mastodynia: Secondary | ICD-10-CM | POA: Diagnosis present

## 2023-02-17 DIAGNOSIS — R64 Cachexia: Secondary | ICD-10-CM | POA: Diagnosis present

## 2023-02-17 DIAGNOSIS — Z170 Estrogen receptor positive status [ER+]: Secondary | ICD-10-CM | POA: Diagnosis not present

## 2023-02-17 DIAGNOSIS — R0602 Shortness of breath: Secondary | ICD-10-CM | POA: Diagnosis present

## 2023-02-17 DIAGNOSIS — C50911 Malignant neoplasm of unspecified site of right female breast: Secondary | ICD-10-CM | POA: Diagnosis not present

## 2023-02-17 DIAGNOSIS — Z853 Personal history of malignant neoplasm of breast: Secondary | ICD-10-CM

## 2023-02-17 LAB — COMPREHENSIVE METABOLIC PANEL
ALT: 15 U/L (ref 0–44)
AST: 19 U/L (ref 15–41)
Albumin: 2.6 g/dL — ABNORMAL LOW (ref 3.5–5.0)
Alkaline Phosphatase: 68 U/L (ref 38–126)
Anion gap: 11 (ref 5–15)
BUN: 7 mg/dL — ABNORMAL LOW (ref 8–23)
CO2: 26 mmol/L (ref 22–32)
Calcium: 8.5 mg/dL — ABNORMAL LOW (ref 8.9–10.3)
Chloride: 100 mmol/L (ref 98–111)
Creatinine, Ser: 0.7 mg/dL (ref 0.44–1.00)
GFR, Estimated: >60 mL/min (ref >60)
Glucose, Bld: 153 mg/dL — ABNORMAL HIGH (ref 70–99)
Potassium: 3.6 mmol/L (ref 3.5–5.1)
Sodium: 137 mmol/L (ref 135–145)
Total Bilirubin: 0.4 mg/dL (ref 0.3–1.2)
Total Protein: 6.5 g/dL (ref 6.5–8.1)

## 2023-02-17 LAB — AMYLASE, PLEURAL OR PERITONEAL FLUID: Amylase, Fluid: 17 U/L

## 2023-02-17 LAB — CBC WITH DIFFERENTIAL/PLATELET
Abs Immature Granulocytes: 0.03 10*3/uL (ref 0.00–0.07)
Basophils Absolute: 0.1 10*3/uL (ref 0.0–0.1)
Basophils Relative: 1 %
Eosinophils Absolute: 0.2 10*3/uL (ref 0.0–0.5)
Eosinophils Relative: 2 %
HCT: 42.1 % (ref 36.0–46.0)
Hemoglobin: 14.2 g/dL (ref 12.0–15.0)
Immature Granulocytes: 0 %
Lymphocytes Relative: 16 %
Lymphs Abs: 1.3 10*3/uL (ref 0.7–4.0)
MCH: 32 pg (ref 26.0–34.0)
MCHC: 33.7 g/dL (ref 30.0–36.0)
MCV: 94.8 fL (ref 80.0–100.0)
Monocytes Absolute: 0.8 10*3/uL (ref 0.1–1.0)
Monocytes Relative: 10 %
Neutro Abs: 5.9 10*3/uL (ref 1.7–7.7)
Neutrophils Relative %: 71 %
Platelets: 360 10*3/uL (ref 150–400)
RBC: 4.44 MIL/uL (ref 3.87–5.11)
RDW: 13 % (ref 11.5–15.5)
WBC: 8.2 10*3/uL (ref 4.0–10.5)
nRBC: 0 % (ref 0.0–0.2)

## 2023-02-17 LAB — BODY FLUID CELL COUNT WITH DIFFERENTIAL
Eos, Fluid: 0 %
Lymphs, Fluid: 79 %
Monocyte-Macrophage-Serous Fluid: 18 % — ABNORMAL LOW (ref 50–90)
Neutrophil Count, Fluid: 3 % (ref 0–25)
Other Cells, Fluid: INCREASED %
Total Nucleated Cell Count, Fluid: 447 cu mm (ref 0–1000)

## 2023-02-17 LAB — PROTEIN, PLEURAL OR PERITONEAL FLUID: Total protein, fluid: 3.5 g/dL

## 2023-02-17 LAB — PROTIME-INR
INR: 1.1 (ref 0.8–1.2)
Prothrombin Time: 14.2 seconds (ref 11.4–15.2)

## 2023-02-17 LAB — GLUCOSE, PLEURAL OR PERITONEAL FLUID: Glucose, Fluid: 140 mg/dL

## 2023-02-17 LAB — LACTATE DEHYDROGENASE, PLEURAL OR PERITONEAL FLUID: LD, Fluid: 329 U/L — ABNORMAL HIGH (ref 3–23)

## 2023-02-17 LAB — LACTATE DEHYDROGENASE: LDH: 137 U/L (ref 98–192)

## 2023-02-17 MED ORDER — SIMVASTATIN 20 MG PO TABS
40.0000 mg | ORAL_TABLET | Freq: Every day | ORAL | Status: DC
  Administered 2023-02-17 – 2023-02-23 (×7): 40 mg via ORAL
  Filled 2023-02-17 (×7): qty 2

## 2023-02-17 MED ORDER — PANTOPRAZOLE SODIUM 40 MG PO TBEC
40.0000 mg | DELAYED_RELEASE_TABLET | Freq: Every day | ORAL | Status: DC
  Administered 2023-02-17 – 2023-02-23 (×7): 40 mg via ORAL
  Filled 2023-02-17 (×7): qty 1

## 2023-02-17 MED ORDER — HYDRALAZINE HCL 20 MG/ML IJ SOLN: 5.0000 mg | INTRAMUSCULAR | Status: DC | PRN

## 2023-02-17 MED ORDER — GABAPENTIN 300 MG PO CAPS
300.0000 mg | ORAL_CAPSULE | Freq: Three times a day (TID) | ORAL | Status: DC
  Administered 2023-02-17 – 2023-02-23 (×19): 300 mg via ORAL
  Filled 2023-02-17 (×19): qty 1

## 2023-02-17 MED ORDER — TAMOXIFEN CITRATE 10 MG PO TABS
20.0000 mg | ORAL_TABLET | Freq: Every day | ORAL | Status: DC
  Administered 2023-02-17 – 2023-02-23 (×7): 20 mg via ORAL
  Filled 2023-02-17 (×7): qty 2

## 2023-02-17 MED ORDER — HYDROMORPHONE HCL 1 MG/ML IJ SOLN: 1.0000 mg | INTRAMUSCULAR | Status: DC | PRN

## 2023-02-17 MED ORDER — SODIUM CHLORIDE 0.9% FLUSH
3.0000 mL | Freq: Two times a day (BID) | INTRAVENOUS | Status: DC
  Administered 2023-02-17 – 2023-02-23 (×12): 3 mL via INTRAVENOUS

## 2023-02-17 MED ORDER — ENSURE ENLIVE PO LIQD
237.0000 mL | Freq: Two times a day (BID) | ORAL | Status: DC
  Administered 2023-02-18 – 2023-02-23 (×9): 237 mL via ORAL

## 2023-02-17 MED ORDER — POLYETHYLENE GLYCOL 3350 17 G PO PACK
17.0000 g | PACK | Freq: Every day | ORAL | Status: DC | PRN
  Administered 2023-02-21: 17 g via ORAL
  Filled 2023-02-17: qty 1

## 2023-02-17 MED ORDER — OXYCODONE HCL 5 MG PO TABS
5.0000 mg | ORAL_TABLET | ORAL | Status: DC | PRN
  Administered 2023-02-17 – 2023-02-23 (×5): 5 mg via ORAL
  Filled 2023-02-17 (×5): qty 1

## 2023-02-17 MED ORDER — DOCUSATE SODIUM 100 MG PO CAPS
100.0000 mg | ORAL_CAPSULE | Freq: Two times a day (BID) | ORAL | Status: DC
  Administered 2023-02-17 – 2023-02-23 (×11): 100 mg via ORAL
  Filled 2023-02-17 (×13): qty 1

## 2023-02-17 MED ORDER — HYDROMORPHONE HCL 1 MG/ML IJ SOLN
0.5000 mg | INTRAMUSCULAR | Status: DC | PRN
  Administered 2023-02-17 – 2023-02-22 (×2): 0.5 mg via INTRAVENOUS
  Filled 2023-02-17 (×2): qty 0.5

## 2023-02-17 MED ORDER — DICYCLOMINE HCL 10 MG PO CAPS: 10.0000 mg | ORAL_CAPSULE | Freq: Four times a day (QID) | ORAL | Status: DC | PRN

## 2023-02-17 MED ORDER — CYCLOSPORINE 0.05 % OP EMUL
1.0000 [drp] | Freq: Two times a day (BID) | OPHTHALMIC | Status: DC
  Administered 2023-02-17 – 2023-02-23 (×12): 1 [drp] via OPHTHALMIC
  Filled 2023-02-17 (×14): qty 30

## 2023-02-17 MED ORDER — BISACODYL 5 MG PO TBEC
5.0000 mg | DELAYED_RELEASE_TABLET | Freq: Every day | ORAL | Status: DC | PRN
  Administered 2023-02-22: 5 mg via ORAL
  Filled 2023-02-17: qty 1

## 2023-02-17 MED ORDER — ADULT MULTIVITAMIN W/MINERALS CH
1.0000 | ORAL_TABLET | Freq: Every day | ORAL | Status: DC
  Administered 2023-02-17 – 2023-02-23 (×7): 1 via ORAL
  Filled 2023-02-17 (×7): qty 1

## 2023-02-17 MED ORDER — ONDANSETRON HCL 4 MG PO TABS: 4.0000 mg | ORAL_TABLET | Freq: Four times a day (QID) | ORAL | Status: DC | PRN

## 2023-02-17 MED ORDER — ONDANSETRON HCL 4 MG/2ML IJ SOLN: 4.0000 mg | Freq: Four times a day (QID) | INTRAMUSCULAR | Status: DC | PRN

## 2023-02-17 MED ORDER — IRBESARTAN 150 MG PO TABS
150.0000 mg | ORAL_TABLET | Freq: Every day | ORAL | Status: DC
  Administered 2023-02-17 – 2023-02-23 (×7): 150 mg via ORAL
  Filled 2023-02-17 (×7): qty 1

## 2023-02-17 MED ORDER — DULOXETINE HCL 60 MG PO CPEP
60.0000 mg | ORAL_CAPSULE | Freq: Two times a day (BID) | ORAL | Status: DC
  Administered 2023-02-17 – 2023-02-23 (×13): 60 mg via ORAL
  Filled 2023-02-17 (×13): qty 1

## 2023-02-17 MED ORDER — LEVOTHYROXINE SODIUM 50 MCG PO TABS
50.0000 ug | ORAL_TABLET | Freq: Every day | ORAL | Status: DC
  Administered 2023-02-17 – 2023-02-23 (×7): 50 ug via ORAL
  Filled 2023-02-17 (×7): qty 1

## 2023-02-17 MED ORDER — ACETAMINOPHEN 325 MG PO TABS
650.0000 mg | ORAL_TABLET | Freq: Four times a day (QID) | ORAL | Status: DC | PRN
  Administered 2023-02-18: 650 mg via ORAL
  Filled 2023-02-17: qty 2

## 2023-02-17 MED ORDER — ALBUTEROL SULFATE (2.5 MG/3ML) 0.083% IN NEBU: 2.5000 mg | INHALATION_SOLUTION | RESPIRATORY_TRACT | Status: DC | PRN

## 2023-02-17 MED ORDER — ACETAMINOPHEN 650 MG RE SUPP: 650.0000 mg | Freq: Four times a day (QID) | RECTAL | Status: DC | PRN

## 2023-02-17 NOTE — Procedures (Signed)
Thoracentesis  Procedure Note  Gina Hunt  [id number]  [birth year]  Date:02/17/23  Time:2:39 PM   Provider Performing:Margert Edsall V. Gregory Barrick   Procedure: Thoracentesis with imaging guidance (40981)  Indication(s) Pleural Effusion See [place] pic in media tab  Consent Risks of the procedure as well as the alternatives and risks of each were explained to the patient and/or caregiver.  Consent for the procedure was obtained and is signed in the bedside chart  Anesthesia Topical only with 1% lidocaine    Time Out Verified patient identification, verified procedure, site/side was marked, verified correct patient position, special equipment/implants available, medications/allergies/relevant history reviewed, required imaging and test results available.   Sterile Technique Maximal sterile technique including full sterile barrier drape, hand hygiene, sterile gown, sterile gloves, mask, hair covering, sterile ultrasound probe cover (if used).  Procedure Description Ultrasound was used to identify appropriate pleural anatomy for placement and overlying skin marked.  Area of drainage cleaned and draped in sterile fashion. Lidocaine was used to anesthetize the skin and subcutaneous tissue.  1600 cc's of amber yellow clear appearing fluid was drained from the right pleural space. Catheter then removed and bandaid applied to site.   Complications/Tolerance None; patient tolerated the procedure well. Chest X-ray is ordered to confirm no post-procedural complication.   EBL Minimal   Specimen(s) Pleural fluid for cell count, chemistry, cell count & cytology  Mckensey Berghuis V. Vassie Loll MD

## 2023-02-17 NOTE — Consult Note (Addendum)
NAME:  Gina Hunt, MRN:  [id number], DOB:  10/15/1946, LOS: 0 ADMISSION DATE:  02/16/2023, CONSULTATION DATE:  02/17/2023  REFERRING MD:  Myrene Buddy, CHIEF COMPLAINT: Shortness of breath  History of Present Illness:  77 year old never smoker presents with a history of shortness of breath ongoing for 3 weeks.  She reports this has been progressive and she can barely walk a few yards now.  She reports dullness and heaviness in her chest not related to deep inspiration. She was found to be hypoxic to 77% on room air in her PCP office and sent over to the ED, required 4 L of oxygen. Chest x-ray showed complete whiteout on the right suggesting large right pleural effusion with mediastinal shift to left. CT angiogram chest showed nodular pleural thickening on the right with large effusion, shift of mediastinum from right to left and collapsed right lung, several left-sided lung nodules and mediastinal lymph nodes.  Multiple sclerotic bone lesions in the spine   Pertinent  Medical History  Left breast cancer treated 2 years ago with mastectomy and chemotherapy  Significant Hospital Events: Including procedures, antibiotic start and stop dates in addition to other pertinent events     Interim History / Subjective:  Complains of chest heaviness and dyspnea  Objective   Blood pressure (!) 159/90, pulse 82, temperature 98.5 F (36.9 C), temperature source Oral, resp. rate 18, height 5' (1.524 m), weight 52.2 kg, SpO2 100 %.    FiO2 (%):  [100 %] 100 %   Intake/Output Summary (Last 24 hours) at 02/17/2023 1301 Last data filed at 02/17/2023 1100 Gross per 24 hour  Intake 240 ml  Output 450 ml  Net -210 ml   Filed Weights   02/16/23 1525  Weight: 52.2 kg    Examination: General: Elderly woman sitting up in bed, no distress on 4 L nasal cannula HENT: Mild pallor, no icterus, no JVD Lungs: No accessory muscle use, decreased breath sounds and dullness on right chest, good breath sounds  on left Cardiovascular: S1-S2 regular, no murmur Abdomen: Soft, nontender, no splenomegaly Extremities: No edema Neuro: Alert, interactive, nonfocal  Labs show mild hypokalemia 3.6, no leukocytosis, hemoglobin 14.2, normal platelets  Resolved Hospital Problem list     Assessment & Plan:  Large right pleural effusion causing right Hemi opacity with shift of mediastinum to left -Unfortunate this appears to be malignant in appearance of pleural nodules, left-sided lung nodules and bone metastasis suggest recurrence of breast cancer most likely although other primary source is possible -Acute respiratory failure with hypoxia  -Proceed with right thoracentesis, therapeutic and diagnostic and send pleural fluid for cytology -We will see if underlying lung expands after this procedure plan to take out 1.5 to 2 L depending on how she tolerates. I discussed risks and benefits of the procedure in detail with the patient with her family members present.  She is willing to proceed  Best Practice (right click and "Reselect all SmartList Selections" daily)    Code Status:  DNR Last date of multidisciplinary goals of care discussion [NA]  Labs   CBC: Recent Labs  Lab 02/16/23 1541 02/17/23 0559  WBC 9.9 8.2  NEUTROABS 7.4 5.9  HGB 14.9 14.2  HCT 45.1 42.1  MCV 95.8 94.8  PLT 401* 360    Basic Metabolic Panel: Recent Labs  Lab 02/16/23 1541 02/17/23 0559  NA 136 137  K 4.4 3.6  CL 103 100  CO2 24 26  GLUCOSE 195* 153*  BUN 11  7*  CREATININE 0.71 0.70  CALCIUM 8.6* 8.5*   GFR: Estimated Creatinine Clearance: 42.3 mL/min (by C-G formula based on SCr of 0.7 mg/dL). Recent Labs  Lab 02/16/23 1541 02/17/23 0559  WBC 9.9 8.2    Liver Function Tests: Recent Labs  Lab 02/16/23 1541 02/17/23 0559  AST 25 19  ALT 16 15  ALKPHOS 81 68  BILITOT 0.4 0.4  PROT 7.5 6.5  ALBUMIN 3.1* 2.6*   No results for input(s): "LIPASE", "AMYLASE" in the last 168 hours. No results for  input(s): "AMMONIA" in the last 168 hours.  ABG No results found for: "PHART", "PCO2ART", "PO2ART", "HCO3", "TCO2", "ACIDBASEDEF", "O2SAT"   Coagulation Profile: Recent Labs  Lab 02/17/23 0559  INR 1.1    Cardiac Enzymes: No results for input(s): "CKTOTAL", "CKMB", "CKMBINDEX", "TROPONINI" in the last 168 hours.  HbA1C: Hgb A1c MFr Bld  Date/Time Value Ref Range Status  09/13/2022 02:25 PM 6.8 (H) 4.6 - 6.5 % Final    Comment:    Glycemic Control Guidelines for People with Diabetes:Non Diabetic:  <6%Goal of Therapy: <7%Additional Action Suggested:  >8%     CBG: No results for input(s): "GLUCAP" in the last 168 hours.  Review of Systems:   Constitutional: negative for anorexia, fevers and sweats  Eyes: negative for irritation, redness and visual disturbance  Ears, nose, mouth, throat, and face: negative for earaches, epistaxis, nasal congestion and sore throat  Respiratory: negative for sputum and wheezing  Cardiovascular: negative for  lower extremity edema, orthopnea, palpitations and syncope  Gastrointestinal: negative for abdominal pain, constipation, diarrhea, melena, nausea and vomiting  Genitourinary:negative for dysuria, frequency and hematuria  Hematologic/lymphatic: negative for bleeding, easy bruising and lymphadenopathy  Musculoskeletal:negative for arthralgias, muscle weakness and stiff joints  Neurological: negative for coordination problems, gait problems, headaches and weakness  Endocrine: negative for diabetic symptoms including polydipsia, polyuria and weight loss   Past Medical History:  She,  has a past medical history of Anxiety, Breast cancer (01/2021), Complication of anesthesia, Family history of breast cancer (12/16/2020), Family history of gastric cancer (12/16/2020), Fibromyalgia, GERD (gastroesophageal reflux disease), Hyperlipidemia, Hypothyroidism, Osteoarthritis, PONV (postoperative nausea and vomiting), and Skin cancer.   Surgical History:    Past Surgical History:  Procedure Laterality Date   ABDOMINAL HYSTERECTOMY     COLONOSCOPY  11/24/2015   ESOPHAGEAL MANOMETRY N/A 07/20/2022   Procedure: ESOPHAGEAL MANOMETRY (EM);  Surgeon: Shellia Cleverly, DO;  Location: [facility];  Service: Gastroenterology;  Laterality: N/A;   EVACUATION BREAST HEMATOMA Left 04/07/2021   Procedure: EVACUATION HEMATOMA BREAST;  Surgeon: Emelia Loron, MD;  Location: [facility];  Service: General;  Laterality: Left;   LIVER SURGERY     Removed appendix and gall bladder   PORTA CATH INSERTION Right 04/07/2021   Procedure: PORTA CATH INSERTION;  Surgeon: Emelia Loron, MD;  Location: [facility];  Service: General;  Laterality: Right;   SIMPLE MASTECTOMY WITH AXILLARY SENTINEL NODE BIOPSY Left 04/07/2021   Procedure: LEFT SIMPLE MASTECTOMY WITH LEFT AXILLARY SENTINEL NODE BIOPSY;  Surgeon: Emelia Loron, MD;  Location: [facility];  Service: General;  Laterality: Left;   TONSILLECTOMY     TRIGGER FINGER RELEASE Right 03/03/2022     Social History:   reports that she has never smoked. She has never used smokeless tobacco. She reports current alcohol use. She reports that she does not use drugs.   Family History:  Her family history includes Arthritis in her mother; Breast cancer (age of  onset: 57) in her maternal aunt; Breast cancer (age of onset: 34) in her maternal aunt; Breast cancer (age of onset: 12) in her maternal aunt; Diabetes in her father and mother; Esophageal cancer (age of onset: 43) in her brother; Heart disease in her father and mother; Hypertension in her mother; Rheum arthritis in her father; Stomach cancer (age of onset: 31) in her brother; Stomach cancer (age of onset: 92) in her brother. There is no history of Colon cancer, Colon polyps, or Pancreatic cancer.   Allergies Allergies  Allergen Reactions   Codeine Other (See Comments)    Hallucination  Other reaction(s):  Respiratory Distress   Morphine And Related Other (See Comments)    Knocks me out per pt     Home Medications  Prior to Admission medications   Medication Sig Start Date End Date Taking? Authorizing Provider  cycloSPORINE (RESTASIS) 0.05 % ophthalmic emulsion Place 1 drop into both eyes 2 (two) times daily. 10/11/21  Yes Magrinat, Valentino Hue, MD  dicyclomine (BENTYL) 10 MG capsule Take 1 capsule (10 mg total) by mouth every 6 (six) hours as needed for spasms. 10/26/21  Yes Cirigliano, Vito V, DO  DULoxetine (CYMBALTA) 60 MG capsule TAKE 1 CAPSULE TWICE DAILY 02/09/23  Yes Wendling, [name], DO  esomeprazole (NEXIUM) 40 MG capsule Take 1 capsule (40 mg total) by mouth daily. 02/01/23  Yes Sharlene Dory, DO  gabapentin (NEURONTIN) 300 MG capsule Take 1 capsule (300 mg total) by mouth 3 (three) times daily. 11/23/21  Yes Sharlene Dory, DO  levothyroxine (SYNTHROID) 50 MCG tablet TAKE 1 TABLET EVERY DAY BEFORE BREAKFAST Patient taking differently: Take 50 mcg by mouth daily before breakfast. 10/21/22  Yes Wendling, [name], DO  olmesartan (BENICAR) 20 MG tablet Take 1 tablet (20 mg total) by mouth daily. 10/14/22  Yes Sharlene Dory, DO  simvastatin (ZOCOR) 40 MG tablet TAKE 1 TABLET EVERY DAY 07/06/22  Yes Sharlene Dory, DO  tamoxifen (NOLVADEX) 20 MG tablet TAKE 1 TABLET EVERY DAY 10/03/22  Yes Iruku, Burnice Logan, MD  traMADol (ULTRAM) 50 MG tablet Take 50 mg by mouth every 6 (six) hours as needed for moderate pain.   Yes [provider]     Cyril Mourning MD. FCCP. [facility] Pager : [phone]  If no response to pager , please call [phone] until 7 pm After 7:00 pm call Elink  [phone]   02/17/2023

## 2023-02-17 NOTE — ED Notes (Signed)
ED TO INPATIENT HANDOFF REPORT  ED Nurse Name and Phone #: Gar Gibbon Name/Age/Gender Gina Hunt 77 y.o. female Room/Bed: MHOTF/OTF  Code Status   Code Status: Prior  Home/SNF/Other Home Patient oriented to: self, place, time, and situation Is this baseline? Yes   Triage Complete: Triage complete  Chief Complaint Acute respiratory failure with hypoxia [J96.01]  Triage Note Pt brought down from upstairs with abnormal EKG and SOB  Pt states SOB started 3 weeks ago  Denies fever On 2L [place] from pcp office, does not usually use O2 at home   Brett Canales, RT at bedside Left base wheezing and course     Allergies Allergies  Allergen Reactions   Codeine Other (See Comments)    Hallucination  Other reaction(s): Respiratory Distress   Morphine And Related Other (See Comments)    Knocks me out per pt    Level of Care/Admitting Diagnosis ED Disposition     ED Disposition  Admit   Condition  --   Comment  Hospital Area: [facility] [[id number]]  Level of Care: Telemetry Medical [104]  May admit patient to [facility] or [facility] if equivalent level of care is available:: Yes  Interfacility transfer: Yes  Covid Evaluation: Symptomatic Person Under Investigation (PUI) or recent exposure (last 10 days) *Testing Required*  Diagnosis: Acute respiratory failure with hypoxia [811914]  Admitting Physician: Katha Cabal [[id number]]  Attending Physician: Katha Cabal [[id number]]  Certification:: I certify there are rare and unusual circumstances requiring inpatient admission  Estimated Length of Stay: 2          B Medical/Surgery History Past Medical History:  Diagnosis Date   Anxiety    Breast cancer 01/2021   left breast IDC   Complication of anesthesia    Family history of breast cancer 12/16/2020   Family history of gastric cancer 12/16/2020   Fibromyalgia    GERD (gastroesophageal reflux disease)    Hyperlipidemia    Hypothyroidism     Osteoarthritis    knees, hands, fingers   Osteoarthritis    PONV (postoperative nausea and vomiting)    Skin cancer    Past Surgical History:  Procedure Laterality Date   ABDOMINAL HYSTERECTOMY     COLONOSCOPY  11/24/2015   ESOPHAGEAL MANOMETRY N/A 07/20/2022   Procedure: ESOPHAGEAL MANOMETRY (EM);  Surgeon: Shellia Cleverly, DO;  Location: [facility];  Service: Gastroenterology;  Laterality: N/A;   EVACUATION BREAST HEMATOMA Left 04/07/2021   Procedure: EVACUATION HEMATOMA BREAST;  Surgeon: Emelia Loron, MD;  Location: [facility];  Service: General;  Laterality: Left;   LIVER SURGERY     Removed appendix and gall bladder   PORTA CATH INSERTION Right 04/07/2021   Procedure: PORTA CATH INSERTION;  Surgeon: Emelia Loron, MD;  Location: [facility];  Service: General;  Laterality: Right;   SIMPLE MASTECTOMY WITH AXILLARY SENTINEL NODE BIOPSY Left 04/07/2021   Procedure: LEFT SIMPLE MASTECTOMY WITH LEFT AXILLARY SENTINEL NODE BIOPSY;  Surgeon: Emelia Loron, MD;  Location: [facility];  Service: General;  Laterality: Left;   TONSILLECTOMY     TRIGGER FINGER RELEASE Right 03/03/2022     A IV Location/Drains/Wounds Patient Lines/Drains/Airways Status     Active Line/Drains/Airways     Name Placement date Placement time Site Days   Peripheral IV 02/16/23 20 G Right Antecubital 02/16/23  1544  Antecubital  1   Closed System Drain 1 Left;Lateral Breast Bulb (JP) 19 Fr. 04/07/21  1612  Breast  681   External Urinary Catheter 02/16/23  2013  --  1            Intake/Output Last 24 hours No intake or output data in the 24 hours ending 02/17/23 0310  Labs/Imaging Results for orders placed or performed during the hospital encounter of 02/16/23 (from the past 48 hour(s))  CBC with Differential     Status: Abnormal   Collection Time: 02/16/23  3:41 PM  Result Value Ref Range   WBC 9.9 4.0 - 10.5 K/uL   RBC 4.71 3.87 -  5.11 MIL/uL   Hemoglobin 14.9 12.0 - 15.0 g/dL   HCT 81.1 91.4 - 78.2 %   MCV 95.8 80.0 - 100.0 fL   MCH 31.6 26.0 - 34.0 pg   MCHC 33.0 30.0 - 36.0 g/dL   RDW 95.6 21.3 - 08.6 %   Platelets 401 (H) 150 - 400 K/uL   nRBC 0.0 0.0 - 0.2 %   Neutrophils Relative % 74 %   Neutro Abs 7.4 1.7 - 7.7 K/uL   Lymphocytes Relative 14 %   Lymphs Abs 1.4 0.7 - 4.0 K/uL   Monocytes Relative 9 %   Monocytes Absolute 0.9 0.1 - 1.0 K/uL   Eosinophils Relative 2 %   Eosinophils Absolute 0.2 0.0 - 0.5 K/uL   Basophils Relative 1 %   Basophils Absolute 0.1 0.0 - 0.1 K/uL   Immature Granulocytes 0 %   Abs Immature Granulocytes 0.04 0.00 - 0.07 K/uL    Comment: Performed at [facility], [address] [place], [place] [address]  Comprehensive metabolic panel     Status: Abnormal   Collection Time: 02/16/23  3:41 PM  Result Value Ref Range   Sodium 136 135 - 145 mmol/L   Potassium 4.4 3.5 - 5.1 mmol/L   Chloride 103 98 - 111 mmol/L   CO2 24 22 - 32 mmol/L   Glucose, Bld 195 (H) 70 - 99 mg/dL    Comment: Glucose reference range applies only to samples taken after fasting for at least 8 hours.   BUN 11 8 - 23 mg/dL   Creatinine, Ser 9.62 0.44 - 1.00 mg/dL   Calcium 8.6 (L) 8.9 - 10.3 mg/dL   Total Protein 7.5 6.5 - 8.1 g/dL   Albumin 3.1 (L) 3.5 - 5.0 g/dL   AST 25 15 - 41 U/L   ALT 16 0 - 44 U/L   Alkaline Phosphatase 81 38 - 126 U/L   Total Bilirubin 0.4 0.3 - 1.2 mg/dL   GFR, Estimated >95 >28 mL/min    Comment: (NOTE) Calculated using the CKD-EPI Creatinine Equation (2021)    Anion gap 9 5 - 15    Comment: Performed at [facility], [address] [place], [place] [address]  Troponin I (High Sensitivity)     Status: None   Collection Time: 02/16/23  3:41 PM  Result Value Ref Range   Troponin I (High Sensitivity) 5 <18 ng/L    Comment: (NOTE) Elevated high sensitivity troponin I (hsTnI) values and significant  changes across serial measurements may suggest ACS  but many other  chronic and acute conditions are known to elevate hsTnI results.  Refer to the "Links" section for chest pain algorithms and additional  guidance. Performed at [facility], [address] [place], [place] [address]   D-dimer, quantitative     Status: Abnormal   Collection Time: 02/16/23  3:41 PM  Result  Value Ref Range   D-Dimer, Quant 2.04 (H) 0.00 - 0.50 ug/mL-FEU    Comment: (NOTE) At the manufacturer cut-off value of 0.5 g/mL FEU, this assay has a negative predictive value of 95-100%.This assay is intended for use in conjunction with a clinical pretest probability (PTP) assessment model to exclude pulmonary embolism (PE) and deep venous thrombosis (DVT) in outpatients suspected of PE or DVT. Results should be correlated with clinical presentation. Performed at [facility], [address] [place], [place] [address]   SARS Coronavirus 2 by RT PCR (hospital order, performed in [facility] hospital lab) *cepheid single result test* Anterior Nasal Swab     Status: None   Collection Time: 02/16/23  6:42 PM   Specimen: Anterior Nasal Swab  Result Value Ref Range   SARS Coronavirus 2 by RT PCR NEGATIVE NEGATIVE    Comment: (NOTE) SARS-CoV-2 target nucleic acids are NOT DETECTED.  The SARS-CoV-2 RNA is generally detectable in upper and lower respiratory specimens during the acute phase of infection. The lowest concentration of SARS-CoV-2 viral copies this assay can detect is 250 copies / mL. A negative result does not preclude SARS-CoV-2 infection and should not be used as the sole basis for treatment or other patient management decisions.  A negative result may occur with improper specimen collection / handling, submission of specimen other than nasopharyngeal swab, presence of viral mutation(s) within the areas targeted by this assay, and inadequate number of viral copies (<250 copies / mL). A negative result must be combined with  clinical observations, patient history, and epidemiological information.  Fact Sheet for Patients:   RoadLapTop.co.za  Fact Sheet for Healthcare Providers: http://kim-miller.com/  This test is not yet approved or  cleared by the [place] FDA and has been authorized for detection and/or diagnosis of SARS-CoV-2 by FDA under an Emergency Use Authorization (EUA).  This EUA will remain in effect (meaning this test can be used) for the duration of the COVID-19 declaration under Section 564(b)(1) of the Act, 21 U.S.C. section 360bbb-3(b)(1), unless the authorization is terminated or revoked sooner.  Performed at [facility] [address] [place], [place] [address]    CT Angio Chest PE W and/or Wo Contrast  Result Date: 02/16/2023 CLINICAL DATA:  Shortness of breath for 3 weeks. EXAM: CT ANGIOGRAPHY CHEST WITH CONTRAST TECHNIQUE: Multidetector CT imaging of the chest was performed using the standard protocol during bolus administration of intravenous contrast. Multiplanar CT image reconstructions and MIPs were obtained to evaluate the vascular anatomy. RADIATION DOSE REDUCTION: This exam was performed according to the departmental dose-optimization program which includes automated exposure control, adjustment of the mA and/or kV according to patient size and/or use of iterative reconstruction technique. CONTRAST:  75mL OMNIPAQUE IOHEXOL 350 MG/ML SOLN COMPARISON:  Chest x-ray 02/16/2023 earlier. CT abdomen pelvis 01/13/2022 FINDINGS: Cardiovascular: The heart is nonenlarged. Trace pericardial fluid. Coronary artery calcifications are seen. The thoracic aorta has some mild atherosclerotic plaque. There is significant breathing motion identified. This limits evaluation of small and peripheral emboli, nondiagnostic for such small foci. No segmental or larger pulmonary embolism identified. Mediastinum/Nodes: Severe shift of the mediastinum  from right to left. Mildly patulous esophagus. Moderate hiatal hernia. No abnormal lymph node enlargement identified in the axillary regions, left hilum. There are however areas of presumed abnormal nodes in the mediastinum such as series 7, image 179 measuring 2.2 by 1.7 cm. This also could be a soft tissue mass. Additional nodule seen posterior to  the sternum inferiorly. Additional other abnormal anterior cardiophrenic nodes. Lungs/Pleura: Complete opacification of the right hemithorax with collapse of the right lung. Very large right-sided pleural effusion with areas of pleural thickening and nodularity. Example posteriorly on series 5, image 85 measuring up to 9 mm in thickness. There is suggestion of the central mass the right lung hilum with the occlusion of bronchi. This areas difficult to define in detail with the level of shift in mass effect. Tissues in this location are distorted. The left lung has several small lung nodules. Example lingula series 5, image 45 measuring 5 mm, left lower lobe series 5, image 68 measuring 13 mm. Several others. Upper Abdomen: Slight nodular contours of the liver. Musculoskeletal: Curvature and degenerative changes along the spine. There are mixed lucent and sclerotic bone lesions identified in the spine for example at T11, T12, L1-L2. Bone metastases are possible. Left-sided mastectomy with axillary surgical clips. Review of the MIP images confirms the above findings. IMPRESSION: Findings worrisome for malignancy. Nodular areas of pleural thickening along the right hemithorax with a very large right pleural effusion causing mass effect with severe shift of the mediastinum from right to left and collapse of the right lung. Distortion of the tissues in the right hilum, possible mass, but incompletely defined on this examination. Several left-sided lung nodules, mediastinal lymph nodes. Mixed lucent sclerotic bone lesions identified along the spine at the thoracolumbar  junction. Possible metastatic disease of the bone. Additional workup with whole-body bone scan as clinically appropriate. Overall, there may be benefit to a follow-up CT scan with contrast after thoracentesis to decrease the lung distortion and improved visualization of the hilum and mediastinum. Significant breathing motion. No segmental or larger pulmonary embolism. Aortic Atherosclerosis (ICD10-I70.0). Electronically Signed   By: Karen Kays M.D.   On: 02/16/2023 17:29   DG Chest 2 View  Result Date: 02/16/2023 CLINICAL DATA:  SOB EXAM: CHEST - 2 VIEW COMPARISON:  08/30/2021 FINDINGS: Right hemithorax completely opacified. No pneumothorax identified. Vascular congestion identified on the left. There is calcification of the aorta. Small hiatal hernia. IMPRESSION: Right hemithorax completely opacified, a new finding. Electronically Signed   By: Layla Maw M.D.   On: 02/16/2023 16:19    Pending Labs Unresulted Labs (From admission, onward)    None       Vitals/Pain Today's Vitals   02/16/23 2130 02/16/23 2335 02/17/23 0000 02/17/23 0100  BP:   (!) 144/85 (!) 162/89  Pulse: 92  86 88  Resp: (!) 23  (!) 28 (!) 22  Temp:  98.7 F (37.1 C)    TempSrc:  Oral    SpO2: 100%  97% 98%  Weight:      Height:      PainSc:        Isolation Precautions Airborne and Contact precautions  Medications Medications  albuterol (VENTOLIN HFA) 108 (90 Base) MCG/ACT inhaler 2 puff (2 puffs Inhalation Given 02/16/23 2052)  ipratropium-albuterol (DUONEB) 0.5-2.5 (3) MG/3ML nebulizer solution 3 mL (3 mLs Nebulization Given 02/16/23 1609)  iohexol (OMNIPAQUE) 350 MG/ML injection 75 mL (75 mLs Intravenous Contrast Given 02/16/23 1704)    Mobility walks No assistance needed    Focused Assessments none   R Recommendations: See Admitting Provider Note  Report given to:   Additional Notes: none

## 2023-02-17 NOTE — Progress Notes (Signed)
Assisted Dr. Vassie Loll with Thoracentesis. Patient sat up on side of bed, patient remained on the monitor and stable. Patient tolerated procedure well, Band-Aid placed on R back.   Patient returned back to bed, CXR ordered and RN notified.

## 2023-02-17 NOTE — Care Management (Signed)
  Transition of Care [facility]) Screening Note   Patient Details  Name: Gina Hunt Date of Birth: [birth year]   Transition of Care [facility]) CM/SW Contact:    Gordy Clement, RN Phone Number: 02/17/2023, 9:43 AM    Transition of Care Department [facility]) has reviewed patient chart   Patient came to floor from ED. Presented to ED from home with SOB. Was placed on O2 up to 4L.  Does not normally use home O2.   TOC will continue to monitor patient advancement through interdisciplinary progression rounds. If new patient transition needs arise, please place a TOC consult.

## 2023-02-17 NOTE — Progress Notes (Signed)
Initial Nutrition Assessment  DOCUMENTATION CODES:   Non-severe (moderate) malnutrition in context of chronic illness  INTERVENTION:  Continue Regular diet as ordered Ensure Enlive po BID, each supplement provides 350 kcal and 20 grams of protein. MVI with minerals daily  NUTRITION DIAGNOSIS:   Moderate Malnutrition related to chronic illness as evidenced by moderate fat depletion, severe muscle depletion, mild muscle depletion.  GOAL:   Patient will meet greater than or equal to 90% of their needs  MONITOR:   PO intake, Supplement acceptance, Labs, Weight trends  REASON FOR ASSESSMENT:   Malnutrition Screening Tool, Consult Other (Comment) (nutrition goals)  ASSESSMENT:   Pt admitted with acute respiratory failure with hypoxia. PMH significant for L breast cancer treated 2 years ago with mastectomy and chemotherapy, dysphagia, GERD  Pt being treated for large R pleural effusion. S/p therapeutic and diagnostic R sided thoracentesis. Per CCM, concern for malignancy in appearance of pleural nodules, left-sided lung nodules and bone metastasis suggest recurrence of breast cancer most likely although other primary source is possible.  Attempted to speak with pt at bedside however she was very drowsy following recent thoracentesis. Multiple family members present, though they were leaving at time of visit. History is limited.   Pt does recall eating poorly. She noticed her appetite/PO intake has been declining a little more than a month. She may have something small to eat. She has been trying to drink up to 2 Ensure daily if able.   Pt reports having a hiatal hernia which she feels causes food to feel stuck in her chest. She denies following any specific diet and would like to continue with regular diet as this time as she can choose softer textured foods that she can tolerate.   Meal completions: 4/19: 75% lunch  Reviewed weight history. Pt's weight appears to have gradually  declined within the last year from 56 kg down to 52 kg. This is a weight loss of 7.4% which is not clinically significant for time frame.   Medications: colace, protonix   Labs reviewed    NUTRITION - FOCUSED PHYSICAL EXAM:  Flowsheet Row Most Recent Value  Orbital Region Moderate depletion  Upper Arm Region Moderate depletion  Thoracic and Lumbar Region No depletion  Buccal Region Mild depletion  Temple Region Mild depletion  Clavicle Bone Region Severe depletion  Clavicle and Acromion Bone Region Severe depletion  Scapular Bone Region Severe depletion  Dorsal Hand Mild depletion  Patellar Region Mild depletion  Anterior Thigh Region Mild depletion  Posterior Calf Region Moderate depletion  Edema (RD Assessment) None  Hair Reviewed  Eyes Reviewed  Mouth Reviewed  Skin Reviewed  Nails Reviewed       Diet Order:   Diet Order             Diet regular Room service appropriate? Yes; Fluid consistency: Thin  Diet effective now                   EDUCATION NEEDS:   Not appropriate for education at this time  Skin:  Skin Assessment: Reviewed RN Assessment  Last BM:  4/17  Height:   Ht Readings from Last 1 Encounters:  02/16/23 5' (1.524 m)    Weight:   Wt Readings from Last 1 Encounters:  02/16/23 52.2 kg   BMI:  Body mass index is 22.48 kg/m.  Estimated Nutritional Needs:   Kcal:  1500-1700  Protein:  75-90g  Fluid:  >/=1.5L  Drusilla Kanner, RDN, LDN Clinical Nutrition

## 2023-02-17 NOTE — Care Management (Addendum)
CM acknowledges "Consult to Case management for [facility] and DME needs"   PT and OT eval orders are in and [facility] will follow for their recs.  Patient is currently on O2  and was not on O2 at home.  Will follow for O2 needs .

## 2023-02-17 NOTE — ED Notes (Signed)
Attempted to call the floor x3 to give report on this Pt. No answer on all 3 attempts. [facility] is at bedside and Pt is being loaded to stretcher. Pt is secured and ready for transport to [facility] room 17.

## 2023-02-17 NOTE — H&P (Signed)
History and Physical    Patient: Gina Hunt ZOX:[id number] DOB: June 23, 1946 DOA: 02/16/2023 DOS: the patient was seen and examined on 02/17/2023 PCP: Sharlene Dory, DO  Patient coming from: Home - lives alone; NOK: Niece, Randye Lobo, [phone]   Chief Complaint: SOB  HPI: Gina Hunt is a 77 y.o. female with medical history significant of breast cancer, HTN, HLD, and hypothyroidism presenting with SOB. She reports that for the last 3 weeks she was SOB.  She has been hurting in her chest.  No precursor symptoms.  3 weeks ago after church she noticed SOB and she thought it was pollen; it has gotten worse since.  She is coughing, dry cough.  No fevers.  No prior lung disease.  Never a smoker.      ER Course:  Carryover, per Dr. Rachael Darby:  Presented to PCP office for 3 weeks of SOB and found to have new O2 requirement, sent to ED- Hypoxic 77% on RA requiring 4L [place]. Troponin negative. CXR with new right hemithorax.  CTA concerning for malignancy, possible hilar mass, large right pleural effusion with mediastinal shift Patient requiring oxygen, concern for possible metastatic cancer, will need thoracentesis, will admit for thoracentesis, oncology consult, and PET scan.       Review of Systems: As mentioned in the history of present illness. All other systems reviewed and are negative. Past Medical History:  Diagnosis Date   Anxiety    Breast cancer 01/2021   left breast IDC   Complication of anesthesia    Family history of breast cancer 12/16/2020   Family history of gastric cancer 12/16/2020   Fibromyalgia    GERD (gastroesophageal reflux disease)    with hiatal hernia   Hyperlipidemia    Hypothyroidism    Osteoarthritis    knees, hands, fingers   PONV (postoperative nausea and vomiting)    Skin cancer    Past Surgical History:  Procedure Laterality Date   ABDOMINAL HYSTERECTOMY     COLONOSCOPY  11/24/2015   ESOPHAGEAL MANOMETRY N/A 07/20/2022    Procedure: ESOPHAGEAL MANOMETRY (EM);  Surgeon: Shellia Cleverly, DO;  Location: [facility];  Service: Gastroenterology;  Laterality: N/A;   EVACUATION BREAST HEMATOMA Left 04/07/2021   Procedure: EVACUATION HEMATOMA BREAST;  Surgeon: Emelia Loron, MD;  Location: [facility];  Service: General;  Laterality: Left;   LIVER SURGERY     Removed appendix and gall bladder   PORTA CATH INSERTION Right 04/07/2021   Procedure: PORTA CATH INSERTION;  Surgeon: Emelia Loron, MD;  Location: [facility];  Service: General;  Laterality: Right;   SIMPLE MASTECTOMY WITH AXILLARY SENTINEL NODE BIOPSY Left 04/07/2021   Procedure: LEFT SIMPLE MASTECTOMY WITH LEFT AXILLARY SENTINEL NODE BIOPSY;  Surgeon: Emelia Loron, MD;  Location: [facility];  Service: General;  Laterality: Left;   TONSILLECTOMY     TRIGGER FINGER RELEASE Right 03/03/2022   Social History:  reports that she has never smoked. She has never used smokeless tobacco. She reports current alcohol use. She reports that she does not use drugs.  Allergies  Allergen Reactions   Codeine Other (See Comments)    Hallucination  Other reaction(s): Respiratory Distress   Morphine And Related Other (See Comments)    Knocks me out per pt    Family History  Problem Relation Age of Onset   Hypertension Mother    Diabetes Mother    Arthritis Mother    Heart disease Mother    Rheum  arthritis Father    Diabetes Father    Heart disease Father    Stomach cancer Brother 51   Stomach cancer Brother 38   Esophageal cancer Brother 8   Breast cancer Maternal Aunt 93   Breast cancer Maternal Aunt 72   Breast cancer Maternal Aunt 73   Colon cancer Neg Hx    Colon polyps Neg Hx    Pancreatic cancer Neg Hx     Prior to Admission medications   Medication Sig Start Date End Date Taking? Authorizing Provider  cycloSPORINE (RESTASIS) 0.05 % ophthalmic emulsion Place 1 drop into both eyes 2 (two)  times daily. 10/11/21   Magrinat, Valentino Hue, MD  dicyclomine (BENTYL) 10 MG capsule Take 1 capsule (10 mg total) by mouth every 6 (six) hours as needed for spasms. 10/26/21   Cirigliano, Vito V, DO  DULoxetine (CYMBALTA) 60 MG capsule TAKE 1 CAPSULE TWICE DAILY 02/09/23   Sharlene Dory, DO  esomeprazole (NEXIUM) 40 MG capsule Take 1 capsule (40 mg total) by mouth daily. 02/01/23   Sharlene Dory, DO  gabapentin (NEURONTIN) 300 MG capsule Take 1 capsule (300 mg total) by mouth 3 (three) times daily. 11/23/21   Sharlene Dory, DO  levothyroxine (SYNTHROID) 50 MCG tablet TAKE 1 TABLET EVERY DAY BEFORE BREAKFAST 10/21/22   Wendling, [name], DO  olmesartan (BENICAR) 20 MG tablet Take 1 tablet (20 mg total) by mouth daily. 10/14/22   Sharlene Dory, DO  simvastatin (ZOCOR) 40 MG tablet TAKE 1 TABLET EVERY DAY 07/06/22   Sharlene Dory, DO  tamoxifen (NOLVADEX) 20 MG tablet TAKE 1 TABLET EVERY DAY 10/03/22   Rachel Moulds, MD  traMADol (ULTRAM) 50 MG tablet tramadol 50 mg tablet  TAKE 1 TABLET BY MOUTH EVERY 6 HOURS AS NEEDED    [provider]    Physical Exam: Vitals:   02/17/23 1421 02/17/23 1424 02/17/23 1427 02/17/23 1500  BP: 99/66 (!) 90/57 (!) 85/59   Pulse: 94 92 88 83  Resp: (!) 21 17 (!) 24 17  Temp:      TempSrc:      SpO2: 92% 92% 92% 93%  Weight:      Height:       General:  Appears calm and comfortable and is in NAD, frail and cachetic - has been losing weight unintentionally for maybe a year Eyes:   EOMI, normal lids, iris ENT:  grossly normal hearing, lips & tongue, mmm Neck:  no LAD, masses or thyromegaly Cardiovascular:  RRR, no m/r/g. No LE edema.  Respiratory:   Markedly diminished breath sounds along the R lung.  Mildly increased respiratory effort despite 4L [place] O2. Abdomen:  soft, NT, ND Skin:  no rash or induration seen on limited exam Musculoskeletal:  grossly normal tone BUE/BLE, good ROM, no bony  abnormality Psychiatric:  blunted mood and affect, speech fluent and appropriate, AOx3 Neurologic:  CN 2-12 grossly intact, moves all extremities in coordinated fashion   Radiological Exams on Admission: Independently reviewed - see discussion in A/P where applicable  DG CHEST PORT 1 VIEW  Result Date: 02/17/2023 CLINICAL DATA:  Status post thoracentesis. EXAM: PORTABLE CHEST 1 VIEW COMPARISON:  Radiograph and chest CT 02/16/2023 FINDINGS: Decreased size of the large right pleural effusion since yesterday after thoracentesis. Improved aeration of the right upper lobe. Left basilar atelectasis/scarring. Similar leftward mediastinal shift. Stable cardiomediastinal silhouette. Aortic atherosclerotic calcification. No pneumothorax. IMPRESSION: Large left pleural effusion, slightly decreased from 02/16/2023. Improved aeration of  the right upper lobe. No pneumothorax. Electronically Signed   By: Minerva Fester M.D.   On: 02/17/2023 16:30   CT Angio Chest PE W and/or Wo Contrast  Result Date: 02/16/2023 CLINICAL DATA:  Shortness of breath for 3 weeks. EXAM: CT ANGIOGRAPHY CHEST WITH CONTRAST TECHNIQUE: Multidetector CT imaging of the chest was performed using the standard protocol during bolus administration of intravenous contrast. Multiplanar CT image reconstructions and MIPs were obtained to evaluate the vascular anatomy. RADIATION DOSE REDUCTION: This exam was performed according to the departmental dose-optimization program which includes automated exposure control, adjustment of the mA and/or kV according to patient size and/or use of iterative reconstruction technique. CONTRAST:  75mL OMNIPAQUE IOHEXOL 350 MG/ML SOLN COMPARISON:  Chest x-ray 02/16/2023 earlier. CT abdomen pelvis 01/13/2022 FINDINGS: Cardiovascular: The heart is nonenlarged. Trace pericardial fluid. Coronary artery calcifications are seen. The thoracic aorta has some mild atherosclerotic plaque. There is significant breathing motion  identified. This limits evaluation of small and peripheral emboli, nondiagnostic for such small foci. No segmental or larger pulmonary embolism identified. Mediastinum/Nodes: Severe shift of the mediastinum from right to left. Mildly patulous esophagus. Moderate hiatal hernia. No abnormal lymph node enlargement identified in the axillary regions, left hilum. There are however areas of presumed abnormal nodes in the mediastinum such as series 7, image 179 measuring 2.2 by 1.7 cm. This also could be a soft tissue mass. Additional nodule seen posterior to the sternum inferiorly. Additional other abnormal anterior cardiophrenic nodes. Lungs/Pleura: Complete opacification of the right hemithorax with collapse of the right lung. Very large right-sided pleural effusion with areas of pleural thickening and nodularity. Example posteriorly on series 5, image 85 measuring up to 9 mm in thickness. There is suggestion of the central mass the right lung hilum with the occlusion of bronchi. This areas difficult to define in detail with the level of shift in mass effect. Tissues in this location are distorted. The left lung has several small lung nodules. Example lingula series 5, image 45 measuring 5 mm, left lower lobe series 5, image 68 measuring 13 mm. Several others. Upper Abdomen: Slight nodular contours of the liver. Musculoskeletal: Curvature and degenerative changes along the spine. There are mixed lucent and sclerotic bone lesions identified in the spine for example at T11, T12, L1-L2. Bone metastases are possible. Left-sided mastectomy with axillary surgical clips. Review of the MIP images confirms the above findings. IMPRESSION: Findings worrisome for malignancy. Nodular areas of pleural thickening along the right hemithorax with a very large right pleural effusion causing mass effect with severe shift of the mediastinum from right to left and collapse of the right lung. Distortion of the tissues in the right hilum,  possible mass, but incompletely defined on this examination. Several left-sided lung nodules, mediastinal lymph nodes. Mixed lucent sclerotic bone lesions identified along the spine at the thoracolumbar junction. Possible metastatic disease of the bone. Additional workup with whole-body bone scan as clinically appropriate. Overall, there may be benefit to a follow-up CT scan with contrast after thoracentesis to decrease the lung distortion and improved visualization of the hilum and mediastinum. Significant breathing motion. No segmental or larger pulmonary embolism. Aortic Atherosclerosis (ICD10-I70.0). Electronically Signed   By: Karen Kays M.D.   On: 02/16/2023 17:29   DG Chest 2 View  Result Date: 02/16/2023 CLINICAL DATA:  SOB EXAM: CHEST - 2 VIEW COMPARISON:  08/30/2021 FINDINGS: Right hemithorax completely opacified. No pneumothorax identified. Vascular congestion identified on the left. There is calcification of the aorta. Small  hiatal hernia. IMPRESSION: Right hemithorax completely opacified, a new finding. Electronically Signed   By: Layla Maw M.D.   On: 02/16/2023 16:19    EKG: Independently reviewed.  NSR with rate 94; nonspecific ST changes with no evidence of acute ischemia   Labs on Admission: I have personally reviewed the available labs and imaging studies at the time of the admission.  Pertinent labs:    Glucose 153 Albumin 2.6 LDH 137 Normal CBC INR 1.1 COVID negative   Assessment and Plan: Principal Problem:   Acute respiratory failure with hypoxia Active Problems:   Hyperlipidemia   Malignant neoplasm of upper-outer quadrant of left breast in female, estrogen receptor positive   Essential hypertension   Type 2 diabetes mellitus with hyperglycemia, without long-term current use of insulin   Pleural effusion   History of breast cancer   Malnutrition of moderate degree    Acute respiratory failure with hypoxia associated with large pleural  effusion -Patient without known underlying lung disease -She has had SOB x 3 weeks and progressive inability to do her usual activities - she reports that mowing her lawn with a riding mower and weed-eating usually takes her about an hour but it took 5 hours this week due to having to stop and rest to breathe -CT showed a large right-sided effusion causing mass effect with severe shift of the mediastinum from R to L and collapse of the R lung (as well as LAD and sclerotic bone lesions suggestive of malignancy) -She is having SOB and hypoxia into the 70s at her PCP office which is likely related to the effusion -Will consult pulmonology (rather than IR since she is likely to need f/u with them) -They are aware of the urgent need for thoracentesis  Breast cancer -s/p L mastectomy with sentinel node biopsy followed by chemotherapy -No prior evidence of recurrence -On tamoxifen -Given her current pleural nodularity in conjunction with scleral lesions, this is likely now metastatic disease - although a secondary malignancy is also possible -Will add Dr. Al Pimple to the treatment team   Hiatal hernia -She last saw Dr. Andrey Campanile on 4/17 -He reported that she was scheduled for robotic repair in 12/2022 but the surgery was canceled -She is scheduled for robotic-assisted hiatal hernia repair on 6/4 as of now; this may need to be postponed/canceled   HTN -Continue olmesartan  HLD -Continue simvastatin  Hypothyroidism -Continue Synthroid  Mood d/o -Continue duloxetine  DNR -I have discussed code status with the patient and she would not desire resuscitation and would prefer to die a natural death should that situation arise.      Advance Care Planning:   Code Status: DNR   Consults: Pulmonology; oncology; PT/OT; TOC team  DVT Prophylaxis: SCDs  Family Communication: None present; I spoke with her niece in the evening following admission  Severity of Illness: The appropriate patient status  for this patient is INPATIENT. Inpatient status is judged to be reasonable and necessary in order to provide the required intensity of service to ensure the patient's safety. The patient's presenting symptoms, physical exam findings, and initial radiographic and laboratory data in the context of their chronic comorbidities is felt to place them at high risk for further clinical deterioration. Furthermore, it is not anticipated that the patient will be medically stable for discharge from the hospital within 2 midnights of admission.   * I certify that at the point of admission it is my clinical judgment that the patient will require inpatient hospital care  spanning beyond 2 midnights from the point of admission due to high intensity of service, high risk for further deterioration and high frequency of surveillance required.*  Author: Jonah Blue, MD 02/17/2023 6:51 PM  For on call review [email or web address].

## 2023-02-18 ENCOUNTER — Inpatient Hospital Stay (HOSPITAL_COMMUNITY): Payer: Medicare HMO

## 2023-02-18 DIAGNOSIS — C50911 Malignant neoplasm of unspecified site of right female breast: Secondary | ICD-10-CM

## 2023-02-18 DIAGNOSIS — J90 Pleural effusion, not elsewhere classified: Secondary | ICD-10-CM | POA: Diagnosis not present

## 2023-02-18 DIAGNOSIS — I10 Essential (primary) hypertension: Secondary | ICD-10-CM

## 2023-02-18 DIAGNOSIS — J9601 Acute respiratory failure with hypoxia: Secondary | ICD-10-CM | POA: Diagnosis not present

## 2023-02-18 LAB — BODY FLUID CULTURE W GRAM STAIN

## 2023-02-18 LAB — CBC
HCT: 40.1 % (ref 36.0–46.0)
Hemoglobin: 13.6 g/dL (ref 12.0–15.0)
MCH: 32.1 pg (ref 26.0–34.0)
MCHC: 33.9 g/dL (ref 30.0–36.0)
MCV: 94.6 fL (ref 80.0–100.0)
Platelets: 339 10*3/uL (ref 150–400)
RBC: 4.24 MIL/uL (ref 3.87–5.11)
RDW: 13.1 % (ref 11.5–15.5)
WBC: 8.6 10*3/uL (ref 4.0–10.5)
nRBC: 0 % (ref 0.0–0.2)

## 2023-02-18 LAB — BASIC METABOLIC PANEL
Anion gap: 7 (ref 5–15)
BUN: 9 mg/dL (ref 8–23)
CO2: 26 mmol/L (ref 22–32)
Calcium: 8.1 mg/dL — ABNORMAL LOW (ref 8.9–10.3)
Chloride: 102 mmol/L (ref 98–111)
Creatinine, Ser: 0.72 mg/dL (ref 0.44–1.00)
GFR, Estimated: >60 mL/min (ref >60)
Glucose, Bld: 145 mg/dL — ABNORMAL HIGH (ref 70–99)
Potassium: 4.1 mmol/L (ref 3.5–5.1)
Sodium: 135 mmol/L (ref 135–145)

## 2023-02-18 NOTE — Progress Notes (Signed)
PROGRESS NOTE    Gina Hunt  ZOX:[id number] DOB: 1946-09-14 DOA: 02/16/2023 PCP: Sharlene Dory, DO   Brief Narrative:  77 y.o. female with medical history significant of breast cancer, HTN, HLD, and hypothyroidism presented with worsening shortness of breath.  On presentation, she was hypoxic to 77% on room air requiring 4 L nasal cannula oxygen.  CTA was concerning for pleural thickening on the right with large right pleural effusion, shift of mediastinum from right to left and collapsed right lung, several left-sided lung nodules and mediastinal lymph nodes with multiple sclerotic bone lesions in the spine.  Pulmonary was consulted and patient underwent thoracentesis with removal of 1600 cc fluid on 02/17/2023.  Assessment & Plan:   Large large right pleural effusion Acute respiratory failure with hypoxia History of breast cancer status post left mastectomy and chemotherapy -Most likely malignant in nature because of pleural nodules, left-sided lung nodules and bone metastases: Possible recurrence of breast cancer -Status post thoracentesis with removal of 1600 cc fluid on 02/17/2023.  Follow further pulmonary recommendations.  Currently on 2 L oxygen by nasal cannula.  Wean off as able -Await oncology evaluation and follow-up as well.  Continue tamoxifen  Hiatal hernia -Is supposed to have outpatient robotic surgical repair with Dr. Andrey Campanile.  Outpatient follow-up with general surgery  Hypertension Continue irbesartan.  Hyperlipidemia -Continue simvastatin  Hypothyroidism -Continue levothyroxine  Mood disorder -Continue duloxetine  Physical deconditioning -PT eval   DVT prophylaxis:  SCDs Code Status: DNR Family Communication: None at bedside Disposition Plan: Status is: Inpatient Remains inpatient appropriate because: Of severity of illness    Consultants: Pulmonary/oncology  Procedures: As above  Antimicrobials: None   Subjective: Patient seen and  examined at bedside.  Breathing is slightly better after fluid removal yesterday.  Still short of breath with exertion.  No fever, chest pain, vomiting reported.  Objective: Vitals:   02/18/23 1245 02/18/23 1300 02/18/23 1305 02/18/23 1310  BP: (!) 109/54 (!) 71/57 (!) 79/49 (!) 85/53  Pulse: 83 95 99 91  Resp: 19 (!) 22 19 (!) 25  Temp:      TempSrc:      SpO2: 100% 99% 97% 92%  Weight:      Height:        Intake/Output Summary (Last 24 hours) at 02/18/2023 1401 Last data filed at 02/18/2023 0900 Gross per 24 hour  Intake 240 ml  Output --  Net 240 ml   Filed Weights   02/16/23 1525  Weight: 52.2 kg    Examination:  General exam: Appears calm and comfortable.  Currently on 2 L oxygen via nasal cannula.  Thinly built.  Looks chronically ill and deconditioned  respiratory system: Bilateral decreased breath sounds at bases with scattered crackles and intermittent tachypnea Cardiovascular system: S1 & S2 heard, Rate controlled Gastrointestinal system: Abdomen is nondistended, soft and nontender. Normal bowel sounds heard. Extremities: No cyanosis, clubbing, edema  Central nervous system: Alert and oriented. No focal neurological deficits. Moving extremities Skin: No rashes, lesions or ulcers Psychiatry: Judgement and insight appear normal. Mood & affect appropriate.     Data Reviewed: I have personally reviewed following labs and imaging studies  CBC: Recent Labs  Lab 02/16/23 1541 02/17/23 0559 02/18/23 0209  WBC 9.9 8.2 8.6  NEUTROABS 7.4 5.9  --   HGB 14.9 14.2 13.6  HCT 45.1 42.1 40.1  MCV 95.8 94.8 94.6  PLT 401* 360 339   Basic Metabolic Panel: Recent Labs  Lab 02/16/23 1541 02/17/23 0559  02/18/23 0209  NA 136 137 135  K 4.4 3.6 4.1  CL 103 100 102  CO2 24 26 26   GLUCOSE 195* 153* 145*  BUN 11 7* 9  CREATININE 0.71 0.70 0.72  CALCIUM 8.6* 8.5* 8.1*   GFR: Estimated Creatinine Clearance: 42.3 mL/min (by C-G formula based on SCr of 0.72  mg/dL). Liver Function Tests: Recent Labs  Lab 02/16/23 1541 02/17/23 0559  AST 25 19  ALT 16 15  ALKPHOS 81 68  BILITOT 0.4 0.4  PROT 7.5 6.5  ALBUMIN 3.1* 2.6*   No results for input(s): "LIPASE", "AMYLASE" in the last 168 hours. No results for input(s): "AMMONIA" in the last 168 hours. Coagulation Profile: Recent Labs  Lab 02/17/23 0559  INR 1.1   Cardiac Enzymes: No results for input(s): "CKTOTAL", "CKMB", "CKMBINDEX", "TROPONINI" in the last 168 hours. BNP (last 3 results) No results for input(s): "PROBNP" in the last 8760 hours. HbA1C: No results for input(s): "HGBA1C" in the last 72 hours. CBG: No results for input(s): "GLUCAP" in the last 168 hours. Lipid Profile: No results for input(s): "CHOL", "HDL", "LDLCALC", "TRIG", "CHOLHDL", "LDLDIRECT" in the last 72 hours. Thyroid Function Tests: No results for input(s): "TSH", "T4TOTAL", "FREET4", "T3FREE", "THYROIDAB" in the last 72 hours. Anemia Panel: No results for input(s): "VITAMINB12", "FOLATE", "FERRITIN", "TIBC", "IRON", "RETICCTPCT" in the last 72 hours. Sepsis Labs: No results for input(s): "PROCALCITON", "LATICACIDVEN" in the last 168 hours.  Recent Results (from the past 240 hour(s))  SARS Coronavirus 2 by RT PCR (hospital order, performed in [facility] hospital lab) *cepheid single result test* Anterior Nasal Swab     Status: None   Collection Time: 02/16/23  6:42 PM   Specimen: Anterior Nasal Swab  Result Value Ref Range Status   SARS Coronavirus 2 by RT PCR NEGATIVE NEGATIVE Final    Comment: (NOTE) SARS-CoV-2 target nucleic acids are NOT DETECTED.  The SARS-CoV-2 RNA is generally detectable in upper and lower respiratory specimens during the acute phase of infection. The lowest concentration of SARS-CoV-2 viral copies this assay can detect is 250 copies / mL. A negative result does not preclude SARS-CoV-2 infection and should not be used as the sole basis for treatment or other patient  management decisions.  A negative result may occur with improper specimen collection / handling, submission of specimen other than nasopharyngeal swab, presence of viral mutation(s) within the areas targeted by this assay, and inadequate number of viral copies (<250 copies / mL). A negative result must be combined with clinical observations, patient history, and epidemiological information.  Fact Sheet for Patients:   RoadLapTop.co.za  Fact Sheet for Healthcare Providers: http://kim-miller.com/  This test is not yet approved or  cleared by the [place] FDA and has been authorized for detection and/or diagnosis of SARS-CoV-2 by FDA under an Emergency Use Authorization (EUA).  This EUA will remain in effect (meaning this test can be used) for the duration of the COVID-19 declaration under Section 564(b)(1) of the Act, 21 U.S.C. section 360bbb-3(b)(1), unless the authorization is terminated or revoked sooner.  Performed at [facility], [address] [place], [place] [address]   Body fluid culture w Gram Stain     Status: None (Preliminary result)   Collection Time: 02/17/23  3:35 PM   Specimen: Pleural Fluid  Result Value Ref Range Status   Specimen Description FLUID PLEURAL  Final   Special Requests NONE  Final   Gram Stain NO WBC SEEN NO ORGANISMS SEEN  Final   Culture   Final    NO GROWTH < 24 HOURS Performed at [facility], [address], [place], [place] [address]    Report Status PENDING  Incomplete         Radiology Studies: DG CHEST PORT 1 VIEW  Result Date: 02/18/2023 CLINICAL DATA:  Post thoracentesis EXAM: PORTABLE CHEST 1 VIEW COMPARISON:  02/17/2023 FINDINGS: Stable heart size. Small hiatal hernia. Aortic atherosclerosis. Moderate right pleural effusion, decreased from prior. Persistent but improving airspace opacities throughout the right lung. No pneumothorax. Left lung remains clear  aside from minor left basilar atelectasis. IMPRESSION: 1. Moderate right pleural effusion, decreased following thoracentesis. No pneumothorax. 2. Persistent but improving airspace opacities throughout the right lung. Electronically Signed   By: Duanne Guess D.O.   On: 02/18/2023 13:55   DG CHEST PORT 1 VIEW  Result Date: 02/17/2023 CLINICAL DATA:  Status post thoracentesis. EXAM: PORTABLE CHEST 1 VIEW COMPARISON:  Radiograph and chest CT 02/16/2023 FINDINGS: Decreased size of the large right pleural effusion since yesterday after thoracentesis. Improved aeration of the right upper lobe. Left basilar atelectasis/scarring. Similar leftward mediastinal shift. Stable cardiomediastinal silhouette. Aortic atherosclerotic calcification. No pneumothorax. IMPRESSION: Large left pleural effusion, slightly decreased from 02/16/2023. Improved aeration of the right upper lobe. No pneumothorax. Electronically Signed   By: Minerva Fester M.D.   On: 02/17/2023 16:30   CT Angio Chest PE W and/or Wo Contrast  Result Date: 02/16/2023 CLINICAL DATA:  Shortness of breath for 3 weeks. EXAM: CT ANGIOGRAPHY CHEST WITH CONTRAST TECHNIQUE: Multidetector CT imaging of the chest was performed using the standard protocol during bolus administration of intravenous contrast. Multiplanar CT image reconstructions and MIPs were obtained to evaluate the vascular anatomy. RADIATION DOSE REDUCTION: This exam was performed according to the departmental dose-optimization program which includes automated exposure control, adjustment of the mA and/or kV according to patient size and/or use of iterative reconstruction technique. CONTRAST:  75mL OMNIPAQUE IOHEXOL 350 MG/ML SOLN COMPARISON:  Chest x-ray 02/16/2023 earlier. CT abdomen pelvis 01/13/2022 FINDINGS: Cardiovascular: The heart is nonenlarged. Trace pericardial fluid. Coronary artery calcifications are seen. The thoracic aorta has some mild atherosclerotic plaque. There is significant  breathing motion identified. This limits evaluation of small and peripheral emboli, nondiagnostic for such small foci. No segmental or larger pulmonary embolism identified. Mediastinum/Nodes: Severe shift of the mediastinum from right to left. Mildly patulous esophagus. Moderate hiatal hernia. No abnormal lymph node enlargement identified in the axillary regions, left hilum. There are however areas of presumed abnormal nodes in the mediastinum such as series 7, image 179 measuring 2.2 by 1.7 cm. This also could be a soft tissue mass. Additional nodule seen posterior to the sternum inferiorly. Additional other abnormal anterior cardiophrenic nodes. Lungs/Pleura: Complete opacification of the right hemithorax with collapse of the right lung. Very large right-sided pleural effusion with areas of pleural thickening and nodularity. Example posteriorly on series 5, image 85 measuring up to 9 mm in thickness. There is suggestion of the central mass the right lung hilum with the occlusion of bronchi. This areas difficult to define in detail with the level of shift in mass effect. Tissues in this location are distorted. The left lung has several small lung nodules. Example lingula series 5, image 45 measuring 5 mm, left lower lobe series 5, image 68 measuring 13 mm. Several others. Upper Abdomen: Slight nodular contours of the liver. Musculoskeletal: Curvature and degenerative changes along the spine. There are mixed lucent and sclerotic bone lesions  identified in the spine for example at T11, T12, L1-L2. Bone metastases are possible. Left-sided mastectomy with axillary surgical clips. Review of the MIP images confirms the above findings. IMPRESSION: Findings worrisome for malignancy. Nodular areas of pleural thickening along the right hemithorax with a very large right pleural effusion causing mass effect with severe shift of the mediastinum from right to left and collapse of the right lung. Distortion of the tissues in the  right hilum, possible mass, but incompletely defined on this examination. Several left-sided lung nodules, mediastinal lymph nodes. Mixed lucent sclerotic bone lesions identified along the spine at the thoracolumbar junction. Possible metastatic disease of the bone. Additional workup with whole-body bone scan as clinically appropriate. Overall, there may be benefit to a follow-up CT scan with contrast after thoracentesis to decrease the lung distortion and improved visualization of the hilum and mediastinum. Significant breathing motion. No segmental or larger pulmonary embolism. Aortic Atherosclerosis (ICD10-I70.0). Electronically Signed   By: Karen Kays M.D.   On: 02/16/2023 17:29   DG Chest 2 View  Result Date: 02/16/2023 CLINICAL DATA:  SOB EXAM: CHEST - 2 VIEW COMPARISON:  08/30/2021 FINDINGS: Right hemithorax completely opacified. No pneumothorax identified. Vascular congestion identified on the left. There is calcification of the aorta. Small hiatal hernia. IMPRESSION: Right hemithorax completely opacified, a new finding. Electronically Signed   By: Layla Maw M.D.   On: 02/16/2023 16:19        Scheduled Meds:  cycloSPORINE  1 drop Both Eyes BID   docusate sodium  100 mg Oral BID   DULoxetine  60 mg Oral BID   feeding supplement  237 mL Oral BID BM   gabapentin  300 mg Oral TID   irbesartan  150 mg Oral Daily   levothyroxine  50 mcg Oral Q0600   multivitamin with minerals  1 tablet Oral Daily   pantoprazole  40 mg Oral Daily   simvastatin  40 mg Oral Daily   sodium chloride flush  3 mL Intravenous Q12H   tamoxifen  20 mg Oral Daily   Continuous Infusions:        Glade Lloyd, MD [facility] 02/18/2023, 2:01 PM

## 2023-02-18 NOTE — Evaluation (Signed)
Physical Therapy Evaluation Patient Details Name: Gina Hunt MRN: [id number] DOB: [birth year] Today's Date: 02/18/2023  History of Present Illness  77 y.o. female presented 4/18 with SOB, hypoxia. s/p thoracentesis 4/19. with medical history significant of breast cancer, HTN, HLD, and hypothyroidism.  Clinical Impression  Pt admitted with above diagnosis. Ambulates with supervision using RW up to 85 feet with several standing rest breaks to complete distance. PTA very independent, active in her yard and community, drives. SpO2 92% on 3L while ambulating 98% on 2L at rest. Reports plenty of support at d/c available from family and friends.  Pt currently with functional limitations due to the deficits listed below (see PT Problem List). Pt will benefit from acute skilled PT to increase their independence and safety with mobility to allow discharge.          Recommendations for follow up therapy are one component of a multi-disciplinary discharge planning process, led by the attending physician.  Recommendations may be updated based on patient status, additional functional criteria and insurance authorization.  Follow Up Recommendations       Assistance Recommended at Discharge Set up Supervision/Assistance  Patient can return home with the following  A little help with walking and/or transfers;A little help with bathing/dressing/bathroom;Assistance with cooking/housework;Assist for transportation    Equipment Recommendations None recommended by PT  Recommendations for Other Services       Functional Status Assessment Patient has had a recent decline in their functional status and demonstrates the ability to make significant improvements in function in a reasonable and predictable amount of time.     Precautions / Restrictions Precautions Precautions: Fall Precaution Comments: monitor O2 Restrictions Weight Bearing Restrictions: No      Mobility  Bed Mobility Overal bed  mobility: Needs Assistance Bed Mobility: Supine to Sit     Supine to sit: Supervision     General bed mobility comments: supervision for safety, extra time, no physical assist.    Transfers Overall transfer level: Needs assistance Equipment used: Rolling walker (2 wheels) Transfers: Sit to/from Stand Sit to Stand: Supervision           General transfer comment: Supervision for safety, slow to rise, minor instability, improves with use of RW for support.    Ambulation/Gait Ambulation/Gait assistance: Supervision Gait Distance (Feet): 85 Feet Assistive device: Rolling walker (2 wheels) Gait Pattern/deviations: Step-through pattern, Decreased stride length Gait velocity: decr Gait velocity interpretation: <1.31 ft/sec, indicative of household ambulator   General Gait Details: Effortful, with several standing rest breaks to complete distance. Light UE support on RW to stabilize, supervision for safety without evidence of buckling or overt LOB with this device. SpO2 avg 92% on 3L suplemental O2 during bout.  Stairs            Wheelchair Mobility    Modified Rankin (Stroke Patients Only)       Balance Overall balance assessment: Needs assistance Sitting-balance support: No upper extremity supported, Feet supported Sitting balance-Leahy Scale: Good     Standing balance support: No upper extremity supported Standing balance-Leahy Scale: Fair                               Pertinent Vitals/Pain Pain Assessment Pain Assessment: No/denies pain    Home Living Family/patient expects to be discharged to:: Private residence   Available Help at Discharge: Family;Available 24 hours/day;Friend(s) Type of Home: House Home Access: Ramped entrance  Home Layout: One level Home Equipment: Agricultural consultant (2 wheels);Rollator (4 wheels);Cane - single point;Shower seat - built in;BSC/3in1      Prior Function Prior Level of Function :  Independent/Modified Independent;History of Falls (last six months);Driving             Mobility Comments: very independent - lots of yard work, driving. Has had some falls recently. ADLs Comments: Ind     Hand Dominance   Dominant Hand: Right    Extremity/Trunk Assessment   Upper Extremity Assessment Upper Extremity Assessment: Defer to OT evaluation    Lower Extremity Assessment Lower Extremity Assessment: Generalized weakness       Communication   Communication: No difficulties  Cognition Arousal/Alertness: Awake/alert Behavior During Therapy: WFL for tasks assessed/performed Overall Cognitive Status: Within Functional Limits for tasks assessed                                          General Comments General comments (skin integrity, edema, etc.): At rest in bed BP 97/74, SpO2 on 2L 96% HR 85 bpm, RR 26; Seated BP 139/127 98% on 2L. Ambulating HR 104, SpO2 92% on 3L; After sitting again SpO2 98% on 2L, HR 80s, BP 97/63.    Exercises General Exercises - Lower Extremity Ankle Circles/Pumps: AROM, Both, 15 reps, Seated Quad Sets: Strengthening, Both, 10 reps, Seated Gluteal Sets: AAROM, Both, 10 reps, Seated   Assessment/Plan    PT Assessment Patient needs continued PT services  PT Problem List Decreased strength;Decreased activity tolerance;Decreased balance;Decreased mobility;Decreased knowledge of use of DME;Cardiopulmonary status limiting activity       PT Treatment Interventions DME instruction;Gait training;Functional mobility training;Therapeutic activities;Therapeutic exercise;Balance training;Neuromuscular re-education;Patient/family education    PT Goals (Current goals can be found in the Care Plan section)  Acute Rehab PT Goals Patient Stated Goal: Go home PT Goal Formulation: With patient Time For Goal Achievement: 03/03/23 Potential to Achieve Goals: Good    Frequency Min 3X/week     Co-evaluation                AM-PAC PT "6 Clicks" Mobility  Outcome Measure Help needed turning from your back to your side while in a flat bed without using bedrails?: A Little Help needed moving from lying on your back to sitting on the side of a flat bed without using bedrails?: A Little Help needed moving to and from a bed to a chair (including a wheelchair)?: A Little Help needed standing up from a chair using your arms (e.g., wheelchair or bedside chair)?: A Little Help needed to walk in hospital room?: A Little Help needed climbing 3-5 steps with a railing? : A Little 6 Click Score: 18    End of Session Equipment Utilized During Treatment: Gait belt;Oxygen Activity Tolerance: Patient tolerated treatment well Patient left: in chair;with call bell/phone within reach;with chair alarm set Nurse Communication: Mobility status;Other (comment) (VS) PT Visit Diagnosis: Unsteadiness on feet (R26.81);Other abnormalities of gait and mobility (R26.89);Muscle weakness (generalized) (M62.81);Difficulty in walking, not elsewhere classified (R26.2)    Time: 5784-6962 PT Time Calculation (min) (ACUTE ONLY): 33 min   Charges:   PT Evaluation $PT Eval Low Complexity: 1 Low PT Treatments $Gait Training: 8-22 mins        Kathlyn Sacramento, PT, DPT Physical Therapist Acute Rehabilitation Services [facility] Outpatient Rehabilitation Services [facility]  Berton Mount 02/18/2023, 10:48 AM

## 2023-02-18 NOTE — Consult Note (Signed)
[facility]  [phone] [phone]   MEDICAL ONCOLOGY - INITIAL CONSULTATION  Referral MD  Reason for Referral: Malignant pleural effusion and likely metastatic diseas  Chief Complaint  Patient presents with   Shortness of Breath     HPI:  This is a very pleasant 77 year old female patient with past medical history significant for left breast cancer diagnosed in February 2022 T2 N0 stage IIa invasive ductal carcinoma, grade 3, ER and PR positive HER2 nonamplified with a Ki-67 of 30%, Oncotype DX showed a score of 32 predicting a risk of recurrence outside the breast of 20% status post left mastectomy and sentinel lymph node sampling, adjuvant CMF and now on adjuvant tamoxifen: Last seen in January and she denies any health issues at that time except for some pain in the right breast.  We have offered her imaging and further investigation but she was quite reluctant and wanted to wait and was encouraged to call us however did not until most recently when she started noticing severe shortness of breath.  CT PE protocol showed findings worrisome for malignancy, nodular areas of pleural thickening along the right hemithorax with a very large right pleural effusion causing mass effect with severe shift of the mediastinum from right to left and collapse of the right lung, distortion of the tissues in the right hilum, possible mass but incompletely defined.  Several left-sided lung nodules mediastinal lymph nodes.  Mixed lucent sclerotic bone lesions identified along the spine.  Possible metastatic disease of the bone was thought to be present.  She had thoracentesis, cytology pending.  When I was visiting her today, her family was with her.  She tells me for the past several weeks she has noticed progressively worsening shortness of breath which made her come to the ER.  She has also noticed some lower back pain she states.  She is understandably worried this  could be metastatic breast cancer.  She has been losing some weight, may have lost about 10 pounds, has appetite but she finds it difficult to swallow food she states.  No change in bowel habits or urinary habits otherwise.  No new neurological complaints reported.  Rest of the pertinent 10 point ROS reviewed and negative  Past Medical History:  Diagnosis Date   Anxiety    Breast cancer 01/2021   left breast IDC   Complication of anesthesia    Family history of breast cancer 12/16/2020   Family history of gastric cancer 12/16/2020   Fibromyalgia    GERD (gastroesophageal reflux disease)    with hiatal hernia   Hyperlipidemia    Hypothyroidism    Osteoarthritis    knees, hands, fingers   PONV (postoperative nausea and vomiting)    Skin cancer   :   Past Surgical History:  Procedure Laterality Date   ABDOMINAL HYSTERECTOMY     COLONOSCOPY  11/24/2015   ESOPHAGEAL MANOMETRY N/A 07/20/2022   Procedure: ESOPHAGEAL MANOMETRY (EM);  Surgeon: Shellia Cleverly, DO;  Location: [facility];  Service: Gastroenterology;  Laterality: N/A;   EVACUATION BREAST HEMATOMA Left 04/07/2021   Procedure: EVACUATION HEMATOMA BREAST;  Surgeon: Emelia Loron, MD;  Location: [facility];  Service: General;  Laterality: Left;   LIVER SURGERY     Removed appendix and gall bladder   PORTA CATH INSERTION Right 04/07/2021   Procedure: PORTA CATH INSERTION;  Surgeon: Emelia Loron, MD;  Location:  SURGERY CENTER;  Service: General;  Laterality: Right;  SIMPLE MASTECTOMY WITH AXILLARY SENTINEL NODE BIOPSY Left 04/07/2021   Procedure: LEFT SIMPLE MASTECTOMY WITH LEFT AXILLARY SENTINEL NODE BIOPSY;  Surgeon: Emelia Loron, MD;  Location: [facility];  Service: General;  Laterality: Left;   TONSILLECTOMY     TRIGGER FINGER RELEASE Right 03/03/2022  :   Current Facility-Administered Medications  Medication Dose Route Frequency Provider Last Rate Last  Admin   acetaminophen (TYLENOL) tablet 650 mg  650 mg Oral Q6H PRN Jonah Blue, MD       Or   acetaminophen (TYLENOL) suppository 650 mg  650 mg Rectal Q6H PRN Jonah Blue, MD       albuterol (PROVENTIL) (2.5 MG/3ML) 0.083% nebulizer solution 2.5 mg  2.5 mg Inhalation Q2H PRN Jonah Blue, MD       bisacodyl (DULCOLAX) EC tablet 5 mg  5 mg Oral Daily PRN Jonah Blue, MD       cycloSPORINE (RESTASIS) 0.05 % ophthalmic emulsion 1 drop  1 drop Both Eyes BID Jonah Blue, MD   1 drop at 02/18/23 1131   dicyclomine (BENTYL) capsule 10 mg  10 mg Oral Q6H PRN Jonah Blue, MD       docusate sodium (COLACE) capsule 100 mg  100 mg Oral BID Jonah Blue, MD   100 mg at 02/18/23 1131   DULoxetine (CYMBALTA) DR capsule 60 mg  60 mg Oral BID Jonah Blue, MD   60 mg at 02/18/23 1131   feeding supplement (ENSURE ENLIVE / ENSURE PLUS) liquid 237 mL  237 mL Oral BID BM Jonah Blue, MD       gabapentin (NEURONTIN) capsule 300 mg  300 mg Oral TID Jonah Blue, MD   300 mg at 02/18/23 1131   hydrALAZINE (APRESOLINE) injection 5 mg  5 mg Intravenous Q4H PRN Jonah Blue, MD       HYDROmorphone (DILAUDID) injection 0.5 mg  0.5 mg Intravenous Q2H PRN Jonah Blue, MD   0.5 mg at 02/17/23 1352   irbesartan (AVAPRO) tablet 150 mg  150 mg Oral Daily Jonah Blue, MD   150 mg at 02/18/23 1131   levothyroxine (SYNTHROID) tablet 50 mcg  50 mcg Oral Q0600 Jonah Blue, MD   50 mcg at 02/18/23 0454   multivitamin with minerals tablet 1 tablet  1 tablet Oral Daily Jonah Blue, MD   1 tablet at 02/18/23 1131   ondansetron (ZOFRAN) tablet 4 mg  4 mg Oral Q6H PRN Jonah Blue, MD       Or   ondansetron [facility]) injection 4 mg  4 mg Intravenous Q6H PRN Jonah Blue, MD       oxyCODONE (Oxy IR/ROXICODONE) immediate release tablet 5 mg  5 mg Oral Q4H PRN Jonah Blue, MD   5 mg at 02/17/23 2340   pantoprazole (PROTONIX) EC tablet 40 mg  40 mg Oral Daily Jonah Blue, MD    40 mg at 02/18/23 1131   polyethylene glycol (MIRALAX / GLYCOLAX) packet 17 g  17 g Oral Daily PRN Jonah Blue, MD       simvastatin (ZOCOR) tablet 40 mg  40 mg Oral Daily Jonah Blue, MD   40 mg at 02/18/23 1131   sodium chloride flush (NS) 0.9 % injection 3 mL  3 mL Intravenous Q12H Jonah Blue, MD   3 mL at 02/18/23 1134   tamoxifen (NOLVADEX) tablet 20 mg  20 mg Oral Daily Jonah Blue, MD   20 mg at 02/18/23 1131      Allergies  Allergen Reactions  Codeine Other (See Comments)    Hallucination  Other reaction(s): Respiratory Distress   Morphine And Related Other (See Comments)    Knocks me out per pt  :   Family History  Problem Relation Age of Onset   Hypertension Mother    Diabetes Mother    Arthritis Mother    Heart disease Mother    Rheum arthritis Father    Diabetes Father    Heart disease Father    Stomach cancer Brother 79   Stomach cancer Brother 42   Esophageal cancer Brother 74   Breast cancer Maternal Aunt 30   Breast cancer Maternal Aunt 72   Breast cancer Maternal Aunt 73   Colon cancer Neg Hx    Colon polyps Neg Hx    Pancreatic cancer Neg Hx   :   Social History   Socioeconomic History   Marital status: Widowed    Spouse name: Not on file   Number of children: Not on file   Years of education: Not on file   Highest education level: Not on file  Occupational History   Occupation: retired  Tobacco Use   Smoking status: Never   Smokeless tobacco: Never  Vaping Use   Vaping Use: Never used  Substance and Sexual Activity   Alcohol use: Yes    Comment: rarely    Drug use: Never   Sexual activity: Not Currently    Birth control/protection: Surgical  Other Topics Concern   Not on file  Social History Narrative   Not on file   Social Determinants of Health   Financial Resource Strain: Medium Risk (01/30/2023)   Overall Financial Resource Strain (CARDIA)    Difficulty of Paying Living Expenses: Somewhat hard  Food  Insecurity: No Food Insecurity (02/17/2023)   Hunger Vital Sign    Worried About Running Out of Food in the Last Year: Never true    Ran Out of Food in the Last Year: Never true  Transportation Needs: No Transportation Needs (02/17/2023)   PRAPARE - Administrator, Civil Service (Medical): No    Lack of Transportation (Non-Medical): No  Physical Activity: Sufficiently Active (01/28/2022)   Exercise Vital Sign    Days of Exercise per Week: 6 days    Minutes of Exercise per Session: 60 min  Stress: Stress Concern Present (01/30/2023)   [employer] of Occupational Health - Occupational Stress Questionnaire    Feeling of Stress : To some extent  Social Connections: Moderately Integrated (01/30/2023)   Social Connection and Isolation Panel [NHANES]    Frequency of Communication with Friends and Family: Once a week    Frequency of Social Gatherings with Friends and Family: Twice a week    Attends Religious Services: More than 4 times per year    Active Member of [employer] or Organizations: No    Attends Banker Meetings: Never    Marital Status: Married  Catering manager Violence: Not At Risk (02/17/2023)   Humiliation, Afraid, Rape, and Kick questionnaire    Fear of Current or Ex-Partner: No    Emotionally Abused: No    Physically Abused: No    Sexually Abused: No   Exam: Patient Vitals for the past 24 hrs:  BP Temp Temp src Pulse Resp SpO2  02/18/23 1310 (!) 85/53 -- -- 91 (!) 25 92 %  02/18/23 1305 (!) 79/49 -- -- 99 19 97 %  02/18/23 1300 (!) 71/57 -- -- 95 (!) 22 99 %  02/18/23  1245 (!) 109/54 -- -- 83 19 100 %  02/18/23 1240 131/78 -- -- 91 19 94 %  02/18/23 0850 (!) 105/59 98.4 F (36.9 C) Oral 81 15 98 %  02/18/23 0400 113/74 98 F (36.7 C) Oral 77 20 91 %  02/17/23 2035 116/66 98.3 F (36.8 C) Oral 79 19 92 %  02/17/23 2034 -- -- -- 80 (!) 21 91 %  02/17/23 2033 -- -- -- 81 19 91 %  02/17/23 2032 -- -- -- 81 20 91 %  02/17/23 2031 -- -- -- 82 19 91  %  02/17/23 2030 -- -- -- 82 (!) 21 91 %  02/17/23 2029 -- -- -- 80 20 91 %  02/17/23 2028 -- -- -- 81 (!) 21 91 %  02/17/23 2027 -- -- -- 80 (!) 22 91 %  02/17/23 2026 -- -- -- 81 (!) 21 91 %  02/17/23 2025 -- -- -- 80 (!) 22 91 %  02/17/23 2024 -- -- -- 81 (!) 21 91 %  02/17/23 2023 -- -- -- 81 20 91 %  02/17/23 2022 -- -- -- 81 (!) 22 92 %  02/17/23 2021 -- -- -- 81 (!) 21 92 %  02/17/23 2020 -- -- -- 82 (!) 22 91 %  02/17/23 2019 -- -- -- 82 (!) 21 91 %  02/17/23 2018 -- -- -- 82 (!) 22 91 %  02/17/23 2017 -- -- -- 83 19 91 %  02/17/23 2016 -- -- -- 82 (!) 21 91 %  02/17/23 2015 -- -- -- 82 (!) 21 91 %  02/17/23 2014 -- -- -- 81 (!) 21 91 %  02/17/23 2013 -- -- -- 82 (!) 21 91 %  02/17/23 2012 -- -- -- 80 (!) 23 91 %  02/17/23 2011 -- -- -- 83 (!) 23 91 %  02/17/23 2010 -- -- -- 81 (!) 24 90 %  02/17/23 2009 -- -- -- 81 (!) 21 91 %  02/17/23 2008 -- -- -- 82 (!) 22 91 %  02/17/23 2007 -- -- -- 82 (!) 22 91 %  02/17/23 2006 -- -- -- 80 (!) 22 91 %  02/17/23 2005 -- -- -- 82 (!) 23 91 %  02/17/23 2004 -- -- -- 81 (!) 22 92 %  02/17/23 2003 -- -- -- 80 (!) 22 92 %  02/17/23 2002 -- -- -- 82 (!) 23 92 %  02/17/23 2001 -- -- -- 80 (!) 22 92 %  02/17/23 2000 -- -- -- 81 (!) 23 92 %  02/17/23 1500 -- -- -- 83 17 93 %  02/17/23 1427 (!) 85/59 -- -- 88 (!) 24 92 %  02/17/23 1424 (!) 90/57 -- -- 92 17 92 %  02/17/23 1421 99/66 -- -- 94 (!) 21 92 %  02/17/23 1418 105/65 -- -- 95 17 94 %  02/17/23 1415 104/68 -- -- 95 17 95 %  02/17/23 1411 110/68 -- -- 99 (!) 22 97 %  02/17/23 1409 101/68 -- -- 100 16 93 %  02/17/23 1406 112/75 -- -- 99 (!) 24 96 %    Physical Exam Constitutional:      Appearance: She is well-developed.  Cardiovascular:     Rate and Rhythm: Normal rate and regular rhythm.  Pulmonary:     Comments: Decreased air entry right middle lobe and right lower lobe Abdominal:     General: Bowel sounds are normal.     Palpations: Abdomen is soft. There  is no  hepatomegaly.  Musculoskeletal:        General: Normal range of motion.     Right lower leg: No edema.     Left lower leg: No edema.  Lymphadenopathy:     Cervical: No cervical adenopathy.  Skin:    General: Skin is warm and dry.  Neurological:     Mental Status: She is alert.      Lab Results  Component Value Date   WBC 8.6 02/18/2023   HGB 13.6 02/18/2023   HCT 40.1 02/18/2023   PLT 339 02/18/2023   GLUCOSE 145 (H) 02/18/2023   CHOL 179 09/12/2022   TRIG 314.0 (H) 09/12/2022   HDL 48.00 09/12/2022   LDLDIRECT 103.0 09/12/2022   LDLCALC 97 06/07/2021   ALT 15 02/17/2023   AST 19 02/17/2023   NA 135 02/18/2023   K 4.1 02/18/2023   CL 102 02/18/2023   CREATININE 0.72 02/18/2023   BUN 9 02/18/2023   CO2 26 02/18/2023    DG CHEST PORT 1 VIEW  Result Date: 02/17/2023 CLINICAL DATA:  Status post thoracentesis. EXAM: PORTABLE CHEST 1 VIEW COMPARISON:  Radiograph and chest CT 02/16/2023 FINDINGS: Decreased size of the large right pleural effusion since yesterday after thoracentesis. Improved aeration of the right upper lobe. Left basilar atelectasis/scarring. Similar leftward mediastinal shift. Stable cardiomediastinal silhouette. Aortic atherosclerotic calcification. No pneumothorax. IMPRESSION: Large left pleural effusion, slightly decreased from 02/16/2023. Improved aeration of the right upper lobe. No pneumothorax. Electronically Signed   By: Minerva Fester M.D.   On: 02/17/2023 16:30   CT Angio Chest PE W and/or Wo Contrast  Result Date: 02/16/2023 CLINICAL DATA:  Shortness of breath for 3 weeks. EXAM: CT ANGIOGRAPHY CHEST WITH CONTRAST TECHNIQUE: Multidetector CT imaging of the chest was performed using the standard protocol during bolus administration of intravenous contrast. Multiplanar CT image reconstructions and MIPs were obtained to evaluate the vascular anatomy. RADIATION DOSE REDUCTION: This exam was performed according to the departmental dose-optimization program  which includes automated exposure control, adjustment of the mA and/or kV according to patient size and/or use of iterative reconstruction technique. CONTRAST:  75mL OMNIPAQUE IOHEXOL 350 MG/ML SOLN COMPARISON:  Chest x-ray 02/16/2023 earlier. CT abdomen pelvis 01/13/2022 FINDINGS: Cardiovascular: The heart is nonenlarged. Trace pericardial fluid. Coronary artery calcifications are seen. The thoracic aorta has some mild atherosclerotic plaque. There is significant breathing motion identified. This limits evaluation of small and peripheral emboli, nondiagnostic for such small foci. No segmental or larger pulmonary embolism identified. Mediastinum/Nodes: Severe shift of the mediastinum from right to left. Mildly patulous esophagus. Moderate hiatal hernia. No abnormal lymph node enlargement identified in the axillary regions, left hilum. There are however areas of presumed abnormal nodes in the mediastinum such as series 7, image 179 measuring 2.2 by 1.7 cm. This also could be a soft tissue mass. Additional nodule seen posterior to the sternum inferiorly. Additional other abnormal anterior cardiophrenic nodes. Lungs/Pleura: Complete opacification of the right hemithorax with collapse of the right lung. Very large right-sided pleural effusion with areas of pleural thickening and nodularity. Example posteriorly on series 5, image 85 measuring up to 9 mm in thickness. There is suggestion of the central mass the right lung hilum with the occlusion of bronchi. This areas difficult to define in detail with the level of shift in mass effect. Tissues in this location are distorted. The left lung has several small lung nodules. Example lingula series 5, image 45 measuring 5 mm, left lower lobe series 5,  image 68 measuring 13 mm. Several others. Upper Abdomen: Slight nodular contours of the liver. Musculoskeletal: Curvature and degenerative changes along the spine. There are mixed lucent and sclerotic bone lesions identified in  the spine for example at T11, T12, L1-L2. Bone metastases are possible. Left-sided mastectomy with axillary surgical clips. Review of the MIP images confirms the above findings. IMPRESSION: Findings worrisome for malignancy. Nodular areas of pleural thickening along the right hemithorax with a very large right pleural effusion causing mass effect with severe shift of the mediastinum from right to left and collapse of the right lung. Distortion of the tissues in the right hilum, possible mass, but incompletely defined on this examination. Several left-sided lung nodules, mediastinal lymph nodes. Mixed lucent sclerotic bone lesions identified along the spine at the thoracolumbar junction. Possible metastatic disease of the bone. Additional workup with whole-body bone scan as clinically appropriate. Overall, there may be benefit to a follow-up CT scan with contrast after thoracentesis to decrease the lung distortion and improved visualization of the hilum and mediastinum. Significant breathing motion. No segmental or larger pulmonary embolism. Aortic Atherosclerosis (ICD10-I70.0). Electronically Signed   By: Karen Kays M.D.   On: 02/16/2023 17:29   DG Chest 2 View  Result Date: 02/16/2023 CLINICAL DATA:  SOB EXAM: CHEST - 2 VIEW COMPARISON:  08/30/2021 FINDINGS: Right hemithorax completely opacified. No pneumothorax identified. Vascular congestion identified on the left. There is calcification of the aorta. Small hiatal hernia. IMPRESSION: Right hemithorax completely opacified, a new finding. Electronically Signed   By: Layla Maw M.D.   On: 02/16/2023 16:19   DG Abd 2 Views  Result Date: 02/03/2023 CLINICAL DATA:  Abdominal pain. EXAM: ABDOMEN - 2 VIEW COMPARISON:  None Available. FINDINGS: The bowel gas pattern is normal without gaseous distention. No radio-opaque calculi or other significant radiographic abnormality are seen. Increased stool noted consistent with constipation. IMPRESSION:  Constipation. Electronically Signed   By: Layla Maw M.D.   On: 02/03/2023 08:43    Pathology: Pending  DG CHEST PORT 1 VIEW  Result Date: 02/17/2023 CLINICAL DATA:  Status post thoracentesis. EXAM: PORTABLE CHEST 1 VIEW COMPARISON:  Radiograph and chest CT 02/16/2023 FINDINGS: Decreased size of the large right pleural effusion since yesterday after thoracentesis. Improved aeration of the right upper lobe. Left basilar atelectasis/scarring. Similar leftward mediastinal shift. Stable cardiomediastinal silhouette. Aortic atherosclerotic calcification. No pneumothorax. IMPRESSION: Large left pleural effusion, slightly decreased from 02/16/2023. Improved aeration of the right upper lobe. No pneumothorax. Electronically Signed   By: Minerva Fester M.D.   On: 02/17/2023 16:30   CT Angio Chest PE W and/or Wo Contrast  Result Date: 02/16/2023 CLINICAL DATA:  Shortness of breath for 3 weeks. EXAM: CT ANGIOGRAPHY CHEST WITH CONTRAST TECHNIQUE: Multidetector CT imaging of the chest was performed using the standard protocol during bolus administration of intravenous contrast. Multiplanar CT image reconstructions and MIPs were obtained to evaluate the vascular anatomy. RADIATION DOSE REDUCTION: This exam was performed according to the departmental dose-optimization program which includes automated exposure control, adjustment of the mA and/or kV according to patient size and/or use of iterative reconstruction technique. CONTRAST:  75mL OMNIPAQUE IOHEXOL 350 MG/ML SOLN COMPARISON:  Chest x-ray 02/16/2023 earlier. CT abdomen pelvis 01/13/2022 FINDINGS: Cardiovascular: The heart is nonenlarged. Trace pericardial fluid. Coronary artery calcifications are seen. The thoracic aorta has some mild atherosclerotic plaque. There is significant breathing motion identified. This limits evaluation of small and peripheral emboli, nondiagnostic for such small foci. No segmental or larger pulmonary embolism identified.  Mediastinum/Nodes: Severe shift of the mediastinum from right to left. Mildly patulous esophagus. Moderate hiatal hernia. No abnormal lymph node enlargement identified in the axillary regions, left hilum. There are however areas of presumed abnormal nodes in the mediastinum such as series 7, image 179 measuring 2.2 by 1.7 cm. This also could be a soft tissue mass. Additional nodule seen posterior to the sternum inferiorly. Additional other abnormal anterior cardiophrenic nodes. Lungs/Pleura: Complete opacification of the right hemithorax with collapse of the right lung. Very large right-sided pleural effusion with areas of pleural thickening and nodularity. Example posteriorly on series 5, image 85 measuring up to 9 mm in thickness. There is suggestion of the central mass the right lung hilum with the occlusion of bronchi. This areas difficult to define in detail with the level of shift in mass effect. Tissues in this location are distorted. The left lung has several small lung nodules. Example lingula series 5, image 45 measuring 5 mm, left lower lobe series 5, image 68 measuring 13 mm. Several others. Upper Abdomen: Slight nodular contours of the liver. Musculoskeletal: Curvature and degenerative changes along the spine. There are mixed lucent and sclerotic bone lesions identified in the spine for example at T11, T12, L1-L2. Bone metastases are possible. Left-sided mastectomy with axillary surgical clips. Review of the MIP images confirms the above findings. IMPRESSION: Findings worrisome for malignancy. Nodular areas of pleural thickening along the right hemithorax with a very large right pleural effusion causing mass effect with severe shift of the mediastinum from right to left and collapse of the right lung. Distortion of the tissues in the right hilum, possible mass, but incompletely defined on this examination. Several left-sided lung nodules, mediastinal lymph nodes. Mixed lucent sclerotic bone lesions  identified along the spine at the thoracolumbar junction. Possible metastatic disease of the bone. Additional workup with whole-body bone scan as clinically appropriate. Overall, there may be benefit to a follow-up CT scan with contrast after thoracentesis to decrease the lung distortion and improved visualization of the hilum and mediastinum. Significant breathing motion. No segmental or larger pulmonary embolism. Aortic Atherosclerosis (ICD10-I70.0). Electronically Signed   By: Karen Kays M.D.   On: 02/16/2023 17:29   DG Chest 2 View  Result Date: 02/16/2023 CLINICAL DATA:  SOB EXAM: CHEST - 2 VIEW COMPARISON:  08/30/2021 FINDINGS: Right hemithorax completely opacified. No pneumothorax identified. Vascular congestion identified on the left. There is calcification of the aorta. Small hiatal hernia. IMPRESSION: Right hemithorax completely opacified, a new finding. Electronically Signed   By: Layla Maw M.D.   On: 02/16/2023 16:19   DG Abd 2 Views  Result Date: 02/03/2023 CLINICAL DATA:  Abdominal pain. EXAM: ABDOMEN - 2 VIEW COMPARISON:  None Available. FINDINGS: The bowel gas pattern is normal without gaseous distention. No radio-opaque calculi or other significant radiographic abnormality are seen. Increased stool noted consistent with constipation. IMPRESSION: Constipation. Electronically Signed   By: Layla Maw M.D.   On: 02/03/2023 08:43    Assessment and Plan:   This is a very pleasant 77 year old female patient with history of left breast cancer T2 N0, ER positive, PR and HER2 negative, Oncotype DX of 32 received adjuvant CMF, now on adjuvant antiestrogen therapy who presented with worsening shortness of breath over the past several weeks and some new onset low back pain.  She was last seen in the clinic back in January when she denied any of the symptoms except for some right breast pain and declined any further investigation.  When  she came to the ED she was found to have massive  effusion on the right side along with findings concerning for metastatic disease in the bone, adenopathy as well as lung nodules.  We have clearly discussed that we are waiting on cytology with the presentation is highly concerning for metastatic malignancy.  She was told that this is stage IV if malignancy is indeed confirmed and the intent of treatment is palliative. She is understandably worried about the treatment options etc.  We have not quite dwelled on the treatment options today.  I agree that she may benefit from Pleurx if she continues to reaccumulate especially since systemic treatments it takes several weeks to a couple months to show a significant effect. I did not however talk to her about Pleurx. I will continue to follow-up on the pathology and I will arrange outpatient follow-up to discuss treatment recommendations. Please consider repeating imaging after thoracentesis especially if Dr. Vassie Loll is planning a second 1 as well as a CT abdomen pelvis, bone scan. The length of time of the face-to-face encounter was 55 minutes. More than 50% of time was spent counseling and coordination of care.     Thank you for this referral.

## 2023-02-18 NOTE — Procedures (Signed)
Thoracentesis  Procedure Note  Gina Hunt  [id number]  1946-04-09  Date:02/18/23  Time:1:11 PM   Provider Performing:Aryahi Denzler V. Arch Methot   Procedure: Thoracentesis with imaging guidance (40981)  Indication(s) Pleural Effusion  Consent Risks of the procedure as well as the alternatives and risks of each were explained to the patient and/or caregiver.  Consent for the procedure was obtained and is signed in the bedside chart  Anesthesia Topical only with 1% lidocaine    Time Out Verified patient identification, verified procedure, site/side was marked, verified correct patient position, special equipment/implants available, medications/allergies/relevant history reviewed, required imaging and test results available.   Sterile Technique Maximal sterile technique including full sterile barrier drape, hand hygiene, sterile gown, sterile gloves, mask, hair covering, sterile ultrasound probe cover (if used).  Procedure Description Ultrasound was used to identify appropriate pleural anatomy for placement and overlying skin marked.  Area of drainage cleaned and draped in sterile fashion. Lidocaine was used to anesthetize the skin and subcutaneous tissue.  1200 cc's of amber yellow appearing fluid was drained from the right pleural space. Catheter then removed and bandaid applied to site.   Complications/Tolerance None; patient tolerated the procedure well. Chest X-ray is ordered to confirm no post-procedural complication.   EBL Minimal   Specimen(s) Pleural fluid for cytology  Gina Hunt V. Vassie Loll MD

## 2023-02-18 NOTE — Progress Notes (Signed)
NAME:  Gina Hunt, MRN:  [id number], DOB:  1946/07/10, LOS: 1 ADMISSION DATE:  02/16/2023, CONSULTATION DATE:  02/18/2023  REFERRING MD:  Myrene Buddy, CHIEF COMPLAINT: Shortness of breath  History of Present Illness:  77 year old never smoker presents with a history of shortness of breath ongoing for 3 weeks.  She reports this has been progressive and she can barely walk a few yards now.  She reports dullness and heaviness in her chest not related to deep inspiration. She was found to be hypoxic to 77% on room air in her PCP office and sent over to the ED, required 4 L of oxygen. Chest x-ray showed complete whiteout on the right suggesting large right pleural effusion with mediastinal shift to left. CT angiogram chest showed nodular pleural thickening on the right with large effusion, shift of mediastinum from right to left and collapsed right lung, several left-sided lung nodules and mediastinal lymph nodes.  Multiple sclerotic bone lesions in the spine   Pertinent  Medical History  Left breast cancer treated 2 years ago with mastectomy and chemotherapy  Significant Hospital Events: Including procedures, antibiotic start and stop dates in addition to other pertinent events   4/19 RT thoracentesis>> 1.6 L 4/20 RT thoracentesis>> 1.2 L  Interim History / Subjective:   Breathing/improved, no chest pain On 2 L of oxygen  Objective   Blood pressure (!) 85/53, pulse 91, temperature 98.4 F (36.9 C), temperature source Oral, resp. rate (!) 25, height 5' (1.524 m), weight 52.2 kg, SpO2 92 %.        Intake/Output Summary (Last 24 hours) at 02/18/2023 1425 Last data filed at 02/18/2023 0900 Gross per 24 hour  Intake 240 ml  Output --  Net 240 ml    Filed Weights   02/16/23 1525  Weight: 52.2 kg    Examination: General: Elderly woman sitting up in bed, no distress on 2 L nasal cannula HENT: Mild pallor, no icterus, no JVD Lungs: Decreased breath sounds on right, no accessory  muscle use Cardiovascular: Regular, no murmur Abdomen: Soft, nontender, no splenomegaly Extremities: No edema Neuro: Alert, interactive, nonfocal  Labs show normal electrolytes, no leukocytosis, stable hemoglobin, normal platelets Pleural fluid results reviewed  Resolved Hospital Problem list     Assessment & Plan:  Large right pleural effusion causing right Hemi opacity with shift of mediastinum to left -lymphocytic exudate -Unfortunate this appears to be malignant in appearance of pleural nodules, left-sided lung nodules and bone metastasis suggest recurrence of breast cancer most likely although other primary source is possible -Acute respiratory failure with hypoxia  -Repeat chest x-ray following second thoracentesis shows good expansion of right lung however procedure was limited by chest tightness and coughing indicate some degree of trapped lung -Will await cytology results She will likely need Pleurx catheter especially if fluid reaccumulate's -Hopefully she can come off oxygen following this procedure but will need to reassess at the time of discharge   Best Practice (right click and "Reselect all SmartList Selections" daily)    Code Status:  DNR Last date of multidisciplinary goals of care discussion [NA]  Labs   CBC: Recent Labs  Lab 02/16/23 1541 02/17/23 0559 02/18/23 0209  WBC 9.9 8.2 8.6  NEUTROABS 7.4 5.9  --   HGB 14.9 14.2 13.6  HCT 45.1 42.1 40.1  MCV 95.8 94.8 94.6  PLT 401* 360 339     Basic Metabolic Panel: Recent Labs  Lab 02/16/23 1541 02/17/23 0559 02/18/23 0209  NA 136 137  135  K 4.4 3.6 4.1  CL 103 100 102  CO2 $RemoveBefore 95* 153* 145*  BUN 11 7* 9  CREATININE 0.71 0.70 0.72  CALCIUM 8.6* 8.5* 8.1*    GFR: Estimated Creatinine Clearance: 42.3 mL/min (by C-G formula based on SCr of 0.72 mg/dL). Recent Labs  Lab 02/16/23 1541 02/17/23 0559 02/18/23 0209  WBC 9.9 8.2 8.6     Liver Function Tests: Recent Labs   Lab 02/16/23 1541 02/17/23 0559  AST 25 19  ALT 16 15  ALKPHOS 81 68  BILITOT 0.4 0.4  PROT 7.5 6.5  ALBUMIN 3.1* 2.6*    No results for input(s): "LIPASE", "AMYLASE" in the last 168 hours. No results for input(s): "AMMONIA" in the last 168 hours.  ABG No results found for: "PHART", "PCO2ART", "PO2ART", "HCO3", "TCO2", "ACIDBASEDEF", "O2SAT"   Coagulation Profile: Recent Labs  Lab 02/17/23 0559  INR 1.1     Cardiac Enzymes: No results for input(s): "CKTOTAL", "CKMB", "CKMBINDEX", "TROPONINI" in the last 168 hours.  HbA1C: Hgb A1c MFr Bld  Date/Time Value Ref Range Status  09/13/2022 02:25 PM 6.8 (H) 4.6 - 6.5 % Final    Comment:    Glycemic Control Guidelines for People with Diabetes:Non Diabetic:  <6%Goal of Therapy: <7%Additional Action Suggested:  >8%     CBG: No results for input(s): "GLUCAP" in the last 168 hours.   Cyril Mourning MD. Tonny Bollman. [facility] Pager : [phone]  If no response to pager , please call [phone] until 7 pm After 7:00 pm call Elink  [phone]   02/18/2023

## 2023-02-19 DIAGNOSIS — J9601 Acute respiratory failure with hypoxia: Secondary | ICD-10-CM | POA: Diagnosis not present

## 2023-02-19 DIAGNOSIS — I10 Essential (primary) hypertension: Secondary | ICD-10-CM | POA: Diagnosis not present

## 2023-02-19 DIAGNOSIS — J90 Pleural effusion, not elsewhere classified: Secondary | ICD-10-CM | POA: Diagnosis not present

## 2023-02-19 LAB — BODY FLUID CULTURE W GRAM STAIN

## 2023-02-19 NOTE — Progress Notes (Signed)
PROGRESS NOTE    Gina Hunt  WUJ:[id number] DOB: 12/05/1945 DOA: 02/16/2023 PCP: Sharlene Dory, DO   Brief Narrative:  77 y.o. female with medical history significant of breast cancer, HTN, HLD, and hypothyroidism presented with worsening shortness of breath.  On presentation, she was hypoxic to 77% on room air requiring 4 L nasal cannula oxygen.  CTA was concerning for pleural thickening on the right with large right pleural effusion, shift of mediastinum from right to left and collapsed right lung, several left-sided lung nodules and mediastinal lymph nodes with multiple sclerotic bone lesions in the spine.  Pulmonary was consulted and patient underwent thoracentesis with removal of 1600 cc fluid on 02/17/2023 and then again on 02/18/2023 with removal of 1200 cc.  Assessment & Plan:   Large large right pleural effusion Acute respiratory failure with hypoxia History of breast cancer status post left mastectomy and chemotherapy -Most likely malignant in nature because of pleural nodules, left-sided lung nodules and bone metastases: Possible recurrence of breast cancer -Status post thoracentesis with removal of 1600 cc fluid on 02/17/2023 and then again on 02/18/2023 with removal of 1200 cc.  Follow further pulmonary recommendations.  Might need Pleurx catheter before discharge.  Currently on 1 L oxygen by nasal cannula.  Wean off as able -Oncology evaluation appreciated.  Continue tamoxifen  Hiatal hernia -Is supposed to have outpatient robotic surgical repair with Dr. Andrey Campanile.  Outpatient follow-up with general surgery  Hypertension Continue irbesartan.  Hyperlipidemia -Continue simvastatin  Hypothyroidism -Continue levothyroxine  Mood disorder -Continue duloxetine  Physical deconditioning -PT recommended outpatient PT  DVT prophylaxis:  SCDs Code Status: DNR Family Communication: None at bedside Disposition Plan: Status is: Inpatient Remains inpatient appropriate  because: Of severity of illness    Consultants: Pulmonary/oncology  Procedures: As above  Antimicrobials: None   Subjective: Patient seen and examined at bedside.  Complains of exertional dyspnea but feeling slightly better.  No fever, chest pain, vomiting reported.  Objective: Vitals:   02/18/23 1310 02/18/23 1558 02/18/23 2100 02/19/23 0547  BP: (!) 85/53 (!) 102/52 95/61 (!) 80/67  Pulse: 91 91 91 95  Resp: (!) 25 16 (!) 26 20  Temp:  97.8 F (36.6 C) 98.2 F (36.8 C) 98.6 F (37 C)  TempSrc:  Oral Oral Oral  SpO2: 92% 100% 95% 93%  Weight:      Height:        Intake/Output Summary (Last 24 hours) at 02/19/2023 0824 Last data filed at 02/18/2023 1700 Gross per 24 hour  Intake 720 ml  Output --  Net 720 ml    Filed Weights   02/16/23 1525  Weight: 52.2 kg    Examination:  General: On 1 L oxygen via nasal cannula.  No distress.  Elderly female lying in bed.  Chronically ill and deconditioned looking. ENT/neck: No thyromegaly.  JVD is not elevated  respiratory: Decreased breath sounds at bases bilaterally with some crackles; no wheezing.  Intermittently tachypneic  CVS: S1-S2 heard, rate controlled currently Abdominal: Soft, nontender, slightly distended; no organomegaly, bowel sounds are heard Extremities: Trace lower extremity edema; no cyanosis  CNS: Awake and alert.  No focal neurologic deficit.  Moves extremities Lymph: No obvious lymphadenopathy Skin: No obvious ecchymosis/lesions  psych: Affect, judgment and mood are normal  musculoskeletal: No obvious joint swelling/deformity     Data Reviewed: I have personally reviewed following labs and imaging studies  CBC: Recent Labs  Lab 02/16/23 1541 02/17/23 0559 02/18/23 0209  WBC 9.9 8.2 8.6  NEUTROABS 7.4 5.9  --   HGB 14.9 14.2 13.6  HCT 45.1 42.1 40.1  MCV 95.8 94.8 94.6  PLT 401* 360 339    Basic Metabolic Panel: Recent Labs  Lab 02/16/23 1541 02/17/23 0559 02/18/23 0209  NA 136  137 135  K 4.4 3.6 4.1  CL 103 100 102  CO2 GLUCOSE 195* 153* 145*  BUN 11 7* 9  CREATININE 0.71 0.70 0.72  CALCIUM 8.6* 8.5* 8.1*    GFR: Estimated Creatinine Clearance: 42.3 mL/min (by C-G formula based on SCr of 0.72 mg/dL). Liver Function Tests: Recent Labs  Lab 02/16/23 1541 02/17/23 0559  AST 25 19  ALT 16 15  ALKPHOS 81 68  BILITOT 0.4 0.4  PROT 7.5 6.5  ALBUMIN 3.1* 2.6*    No results for input(s): "LIPASE", "AMYLASE" in the last 168 hours. No results for input(s): "AMMONIA" in the last 168 hours. Coagulation Profile: Recent Labs  Lab 02/17/23 0559  INR 1.1    Cardiac Enzymes: No results for input(s): "CKTOTAL", "CKMB", "CKMBINDEX", "TROPONINI" in the last 168 hours. BNP (last 3 results) No results for input(s): "PROBNP" in the last 8760 hours. HbA1C: No results for input(s): "HGBA1C" in the last 72 hours. CBG: No results for input(s): "GLUCAP" in the last 168 hours. Lipid Profile: No results for input(s): "CHOL", "HDL", "LDLCALC", "TRIG", "CHOLHDL", "LDLDIRECT" in the last 72 hours. Thyroid Function Tests: No results for input(s): "TSH", "T4TOTAL", "FREET4", "T3FREE", "THYROIDAB" in the last 72 hours. Anemia Panel: No results for input(s): "VITAMINB12", "FOLATE", "FERRITIN", "TIBC", "IRON", "RETICCTPCT" in the last 72 hours. Sepsis Labs: No results for input(s): "PROCALCITON", "LATICACIDVEN" in the last 168 hours.  Recent Results (from the past 240 hour(s))  SARS Coronavirus 2 by RT PCR (hospital order, performed in [facility] hospital lab) *cepheid single result test* Anterior Nasal Swab     Status: None   Collection Time: 02/16/23  6:42 PM   Specimen: Anterior Nasal Swab  Result Value Ref Range Status   SARS Coronavirus 2 by RT PCR NEGATIVE NEGATIVE Final    Comment: (NOTE) SARS-CoV-2 target nucleic acids are NOT DETECTED.  The SARS-CoV-2 RNA is generally detectable in upper and lower respiratory specimens during the acute phase of  infection. The lowest concentration of SARS-CoV-2 viral copies this assay can detect is 250 copies / mL. A negative result does not preclude SARS-CoV-2 infection and should not be used as the sole basis for treatment or other patient management decisions.  A negative result may occur with improper specimen collection / handling, submission of specimen other than nasopharyngeal swab, presence of viral mutation(s) within the areas targeted by this assay, and inadequate number of viral copies (<250 copies / mL). A negative result must be combined with clinical observations, patient history, and epidemiological information.  Fact Sheet for Patients:   RoadLapTop.co.za  Fact Sheet for Healthcare Providers: http://kim-miller.com/  This test is not yet approved or  cleared by the [place] FDA and has been authorized for detection and/or diagnosis of SARS-CoV-2 by FDA under an Emergency Use Authorization (EUA).  This EUA will remain in effect (meaning this test can be used) for the duration of the COVID-19 declaration under Section 564(b)(1) of the Act, 21 U.S.C. section 360bbb-3(b)(1), unless the authorization is terminated or revoked sooner.  Performed at [facility], [address] [place], [place] [address]   Body fluid culture w Gram Stain     Status: None (Preliminary result)  Collection Time: 02/17/23  3:35 PM   Specimen: Pleural Fluid  Result Value Ref Range Status   Specimen Description FLUID PLEURAL  Final   Special Requests NONE  Final   Gram Stain NO WBC SEEN NO ORGANISMS SEEN   Final   Culture   Final    NO GROWTH < 24 HOURS Performed at [facility], [address], [place], [place] [address]    Report Status PENDING  Incomplete         Radiology Studies: DG CHEST PORT 1 VIEW  Result Date: 02/18/2023 CLINICAL DATA:  Post thoracentesis EXAM: PORTABLE CHEST 1 VIEW COMPARISON:  02/17/2023  FINDINGS: Stable heart size. Small hiatal hernia. Aortic atherosclerosis. Moderate right pleural effusion, decreased from prior. Persistent but improving airspace opacities throughout the right lung. No pneumothorax. Left lung remains clear aside from minor left basilar atelectasis. IMPRESSION: 1. Moderate right pleural effusion, decreased following thoracentesis. No pneumothorax. 2. Persistent but improving airspace opacities throughout the right lung. Electronically Signed   By: Duanne Guess D.O.   On: 02/18/2023 13:55   DG CHEST PORT 1 VIEW  Result Date: 02/17/2023 CLINICAL DATA:  Status post thoracentesis. EXAM: PORTABLE CHEST 1 VIEW COMPARISON:  Radiograph and chest CT 02/16/2023 FINDINGS: Decreased size of the large right pleural effusion since yesterday after thoracentesis. Improved aeration of the right upper lobe. Left basilar atelectasis/scarring. Similar leftward mediastinal shift. Stable cardiomediastinal silhouette. Aortic atherosclerotic calcification. No pneumothorax. IMPRESSION: Large left pleural effusion, slightly decreased from 02/16/2023. Improved aeration of the right upper lobe. No pneumothorax. Electronically Signed   By: Minerva Fester M.D.   On: 02/17/2023 16:30        Scheduled Meds:  cycloSPORINE  1 drop Both Eyes BID   docusate sodium  100 mg Oral BID   DULoxetine  60 mg Oral BID   feeding supplement  237 mL Oral BID BM   gabapentin  300 mg Oral TID   irbesartan  150 mg Oral Daily   levothyroxine  50 mcg Oral Q0600   multivitamin with minerals  1 tablet Oral Daily   pantoprazole  40 mg Oral Daily   simvastatin  40 mg Oral Daily   sodium chloride flush  3 mL Intravenous Q12H   tamoxifen  20 mg Oral Daily   Continuous Infusions:        Glade Lloyd, MD [facility] 02/19/2023, 8:24 AM

## 2023-02-19 NOTE — Evaluation (Signed)
Occupational Therapy Evaluation Patient Details Name: Gina Hunt MRN: [id number] DOB: 07/02/1946 Today's Date: 02/19/2023   History of Present Illness 77 y.o. female presented 4/18 with SOB, hypoxia. s/p thoracentesis 4/19. with medical history significant of breast cancer, HTN, HLD, and hypothyroidism.   Clinical Impression   PTA, pt lives alone and typically very independent, gardening and walks her dog twice a day. Pt presents now with deficits in cardiopulmonary endurance, strength and dynamic standing balance. Pt requires min guard for safety with bathroom mobility without AD and min guard for LB ADLs. Began discussions re: when to use DME, energy conservation and O2 monitoring w/ plans to further address acutely. Based on high PLOF, pt may benefit from therapy follow up in her home environment.  SpO2 90% on RA with ADLs/bathroom mobility, 97% at rest     Recommendations for follow up therapy are one component of a multi-disciplinary discharge planning process, led by the attending physician.  Recommendations may be updated based on patient status, additional functional criteria and insurance authorization.   Assistance Recommended at Discharge Set up Supervision/Assistance  Patient can return home with the following Assistance with cooking/housework;Assist for transportation;Help with stairs or ramp for entrance    Functional Status Assessment  Patient has had a recent decline in their functional status and demonstrates the ability to make significant improvements in function in a reasonable and predictable amount of time.  Equipment Recommendations  None recommended by OT    Recommendations for Other Services       Precautions / Restrictions Precautions Precautions: Fall Precaution Comments: monitor O2 Restrictions Weight Bearing Restrictions: No      Mobility Bed Mobility Overal bed mobility: Modified Independent Bed Mobility: Supine to Sit                 Transfers Overall transfer level: Needs assistance Equipment used: None Transfers: Sit to/from Stand Sit to Stand: Min guard                  Balance Overall balance assessment: Needs assistance Sitting-balance support: No upper extremity supported, Feet supported Sitting balance-Leahy Scale: Good     Standing balance support: No upper extremity supported Standing balance-Leahy Scale: Fair                             ADL either performed or assessed with clinical judgement   ADL Overall ADL's : Needs assistance/impaired Eating/Feeding: Independent   Grooming: Standing;Min guard;Brushing hair Grooming Details (indicate cue type and reason): min guard for balance, some unsteadiness noted Upper Body Bathing: Supervision/ safety;Sitting   Lower Body Bathing: Min guard;Sit to/from stand   Upper Body Dressing : Supervision/safety;Sitting   Lower Body Dressing: Min guard;Sitting/lateral leans;Sit to/from stand   Toilet Transfer: Min guard;Ambulation;Regular Teacher, adult education Details (indicate cue type and reason): without AD vs handheld assist. some unsteadiness Toileting- Clothing Manipulation and Hygiene: Min guard;Sitting/lateral lean;Sit to/from stand       Functional mobility during ADLs: Min guard General ADL Comments: focus on educating when DME needed, weaning to RA and pacing     Vision Ability to See in Adequate Light: 0 Adequate Patient Visual Report: No change from baseline Vision Assessment?: No apparent visual deficits     Perception     Praxis      Pertinent Vitals/Pain Pain Assessment Pain Assessment: Faces Faces Pain Scale: Hurts a little bit Pain Location: "sore all over" Pain Descriptors / Indicators: Sore Pain  Intervention(s): Monitored during session     Hand Dominance Right   Extremity/Trunk Assessment Upper Extremity Assessment Upper Extremity Assessment: Generalized weakness   Lower Extremity  Assessment Lower Extremity Assessment: Defer to PT evaluation   Cervical / Trunk Assessment Cervical / Trunk Assessment: Normal   Communication Communication Communication: No difficulties   Cognition Arousal/Alertness: Awake/alert Behavior During Therapy: WFL for tasks assessed/performed Overall Cognitive Status: Within Functional Limits for tasks assessed                                       General Comments  Variable O2 pleth/readings, switched probe type and digits. SpO2 truly > 90% on RA with ADLs    Exercises     Shoulder Instructions      Home Living Family/patient expects to be discharged to:: Private residence Living Arrangements: Alone Available Help at Discharge: Family;Available 24 hours/day;Friend(s) Type of Home: House Home Access: Ramped entrance     Home Layout: One level     Bathroom Shower/Tub: Producer, television/film/video: Standard Bathroom Accessibility: Yes   Home Equipment: Agricultural consultant (2 wheels);Rollator (4 wheels);Cane - single point;Shower seat - built in;BSC/3in1          Prior Functioning/Environment Prior Level of Function : Independent/Modified Independent;History of Falls (last six months);Driving             Mobility Comments: very independent - lots of yard work, driving, takes her dog for walks twice a day. Has had some falls recently. ADLs Comments: Ind        OT Problem List: Decreased strength;Decreased activity tolerance;Impaired balance (sitting and/or standing);Cardiopulmonary status limiting activity;Decreased knowledge of use of DME or AE      OT Treatment/Interventions: Self-care/ADL training;Therapeutic exercise;DME and/or AE instruction;Energy conservation;Therapeutic activities;Balance training;Patient/family education    OT Goals(Current goals can be found in the care plan section) Acute Rehab OT Goals Patient Stated Goal: get rid of this O2, get back out in the garden and see my  dog OT Goal Formulation: With patient Time For Goal Achievement: 03/05/23 Potential to Achieve Goals: Good  OT Frequency: Min 2X/week    Co-evaluation              AM-PAC OT "6 Clicks" Daily Activity     Outcome Measure Help from another person eating meals?: None Help from another person taking care of personal grooming?: A Little Help from another person toileting, which includes using toliet, bedpan, or urinal?: A Little Help from another person bathing (including washing, rinsing, drying)?: A Little Help from another person to put on and taking off regular upper body clothing?: A Little Help from another person to put on and taking off regular lower body clothing?: A Little 6 Click Score: 19   End of Session Nurse Communication: Mobility status;Other (comment) (O2)  Activity Tolerance: Patient tolerated treatment well Patient left: in chair;with call bell/phone within reach;with chair alarm set  OT Visit Diagnosis: Unsteadiness on feet (R26.81);Other abnormalities of gait and mobility (R26.89)                Time: 0981-1914 OT Time Calculation (min): 26 min Charges:  OT General Charges $OT Visit: 1 Visit OT Evaluation $OT Eval Moderate Complexity: 1 Mod OT Treatments $Self Care/Home Management : 8-22 mins  Bradd Canary, OTR/L Acute Rehab Services Office: [phone]   Lorre Munroe 02/19/2023, 8:01 AM

## 2023-02-20 ENCOUNTER — Inpatient Hospital Stay (HOSPITAL_COMMUNITY): Payer: Medicare HMO

## 2023-02-20 DIAGNOSIS — J9601 Acute respiratory failure with hypoxia: Secondary | ICD-10-CM | POA: Diagnosis not present

## 2023-02-20 DIAGNOSIS — C50911 Malignant neoplasm of unspecified site of right female breast: Secondary | ICD-10-CM | POA: Diagnosis not present

## 2023-02-20 DIAGNOSIS — J90 Pleural effusion, not elsewhere classified: Secondary | ICD-10-CM | POA: Diagnosis not present

## 2023-02-20 LAB — BODY FLUID CULTURE W GRAM STAIN
Culture: NO GROWTH
Gram Stain: NONE SEEN

## 2023-02-20 NOTE — Progress Notes (Signed)
   NAME:  Gina Hunt, MRN:  [id number], DOB:  1946/04/24, LOS: 3 ADMISSION DATE:  02/16/2023, CONSULTATION DATE:  02/20/2023  REFERRING MD:  Myrene Buddy, CHIEF COMPLAINT: Shortness of breath  History of Present Illness:  77 year old never smoker presents with a history of shortness of breath ongoing for 3 weeks.  She reports this has been progressive and she can barely walk a few yards now.  She reports dullness and heaviness in her chest not related to deep inspiration. She was found to be hypoxic to 77% on room air in her PCP office and sent over to the ED, required 4 L of oxygen. Chest x-ray showed complete whiteout on the right suggesting large right pleural effusion with mediastinal shift to left. CT angiogram chest showed nodular pleural thickening on the right with large effusion, shift of mediastinum from right to left and collapsed right lung, several left-sided lung nodules and mediastinal lymph nodes.  Multiple sclerotic bone lesions in the spine   Pertinent  Medical History  Left breast cancer treated 2 years ago with mastectomy and chemotherapy  Significant Hospital Events: Including procedures, antibiotic start and stop dates in addition to other pertinent events   4/19 RT thoracentesis>> 1.6 L 4/20 RT thoracentesis>> 1.2 L 4/22 Am CXR Stable, patient weaned to RA this am. Cytology for sample 4/19 and 4/20 remains pending   Interim History / Subjective:  Seen walking with PT this am  Denies any acute complaints   Objective   Blood pressure 114/61, pulse 86, temperature 98.3 F (36.8 C), temperature source Oral, resp. rate 20, height 5' (1.524 m), weight 52.2 kg, SpO2 93 %.       No intake or output data in the 24 hours ending 02/20/23 1122  Filed Weights   02/16/23 1525  Weight: 52.2 kg    Examination: General: Very pleasant elderly female sitting up in bed, in NAD HEENT: [place]/AT, MM pink/moist, PERRL,  Neuro: Alert and oriented x3, non-focal CV: s1s2 regular  rate and rhythm, no murmur, rubs, or gallops,  PULM:  Diminished right side, no increased work of breathing, on RA GI: soft, bowel sounds active in all 4 quadrants, non-tender, non-distended, tolerating oral diet Extremities: warm/dry, no edema  Skin: no rashes or lesions  Resolved Hospital Problem list     Assessment & Plan:  Large right pleural effusion -Causing right Hemi opacity with shift of mediastinum to left on admission -lymphocytic exudate -Unfortunate this appears to be malignant in appearance of pleural nodules, left-sided lung nodules and bone metastasis suggest recurrence of breast cancer most likely although other primary source is possible -Acute respiratory failure with hypoxia P: Repeat CXR stable  PCCM will continue to follow cytology to help determine best plan of action for management of recurrent pleural effusion. This can also be done in the outpatient setting if patient becomes medically stable for discharge  Mobilize Encourage pulmonary hygiene   Best Practice (right click and "Reselect all SmartList Selections" daily)  Per primary    [place] Pam D. Harris, NP-C [facility] Personal contact information can be found on Amion  If no contact or response made please call [phone] 02/20/2023, 12:10 PM

## 2023-02-20 NOTE — Progress Notes (Signed)
Mobility Specialist Progress Note   02/20/23 1200  Mobility  Activity Ambulated with assistance in hallway  Level of Assistance Standby assist, set-up cues, supervision of patient - no hands on  Assistive Device Front wheel walker  Distance Ambulated (ft) 120 ft  Range of Motion/Exercises Active;All extremities  Activity Response Tolerated well   Patient received in supine and agreeable to participate. Completed bed mobility independently and ambulated supervision level with slow steady gait. Returned to room without complaint or incident. Was left in supine with all needs met and radiology tech present.  [place] Graeden Bitner, BS EXP Mobility Specialist Please contact via SecureChat or Rehab office at [phone]

## 2023-02-20 NOTE — Progress Notes (Signed)
Physical Therapy Treatment Patient Details Name: Gina Hunt MRN: [id number] DOB: 05-11-1946 Today's Date: 02/20/2023   History of Present Illness 77 y.o. female presented 4/18 with SOB, hypoxia. s/p thoracentesis 4/19. with medical history significant of breast cancer, HTN, HLD, and hypothyroidism.    PT Comments    Tolerated gait and transfer training with therapy today. Very pleasant. SpO2 on room air maintained 90-94% with mobility. Ambulating up to 90 feet, with moderate DOE, and 2 standing rest breaks to complete distance while using RW for stability. Rt chest/flank pain reported. Reviewed LE exercises to reduce muscle atrophy during admission. Encouraged OOB frequently with staff. Patient will continue to benefit from skilled physical therapy services to further improve independence with functional mobility.   Recommendations for follow up therapy are one component of a multi-disciplinary discharge planning process, led by the attending physician.  Recommendations may be updated based on patient status, additional functional criteria and insurance authorization.  Follow Up Recommendations   OPPT    Assistance Recommended at Discharge Set up Supervision/Assistance  Patient can return home with the following A little help with walking and/or transfers;A little help with bathing/dressing/bathroom;Assistance with cooking/housework;Assist for transportation   Equipment Recommendations  None recommended by PT    Recommendations for Other Services       Precautions / Restrictions Precautions Precautions: Fall Precaution Comments: monitor O2 Restrictions Weight Bearing Restrictions: No     Mobility  Bed Mobility Overal bed mobility: Modified Independent             General bed mobility comments: Extra time, some effort but no therapy assistance to get to EOB today.    Transfers Overall transfer level: Needs assistance Equipment used: Rolling walker (2  wheels) Transfers: Sit to/from Stand Sit to Stand: Supervision           General transfer comment: Supervision for safety, slow to rise, stable with use of RW for light support.    Ambulation/Gait Ambulation/Gait assistance: Supervision Gait Distance (Feet): 90 Feet Assistive device: Rolling walker (2 wheels) Gait Pattern/deviations: Step-through pattern, Decreased stride length Gait velocity: decr Gait velocity interpretation: <1.31 ft/sec, indicative of household ambulator   General Gait Details: Slow but steady with use of RW for support. On RA, maintains SpO2 90-94% throughout, mod DOE reported. No buckling or overt LOB this date. Required 2 standing rest breaks to complete distance. Cues for pacing, and awareness.   Stairs             Wheelchair Mobility    Modified Rankin (Stroke Patients Only)       Balance Overall balance assessment: Needs assistance Sitting-balance support: No upper extremity supported, Feet supported Sitting balance-Leahy Scale: Good     Standing balance support: No upper extremity supported Standing balance-Leahy Scale: Fair                              Cognition Arousal/Alertness: Awake/alert Behavior During Therapy: WFL for tasks assessed/performed Overall Cognitive Status: Within Functional Limits for tasks assessed                                          Exercises General Exercises - Lower Extremity Ankle Circles/Pumps: AROM, Both, 15 reps, Seated Quad Sets: Strengthening, Both, 10 reps, Seated Gluteal Sets: AAROM, Both, 10 reps, Seated    General Comments General comments (skin  integrity, edema, etc.): SpO2 97% on RA at rest, 90-94% while ambulating      Pertinent Vitals/Pain Pain Assessment Pain Assessment: Faces Faces Pain Scale: Hurts even more Pain Location: Rt chest area Pain Descriptors / Indicators: Sore, Constant Pain Intervention(s): Monitored during session, Repositioned     Home Living                          Prior Function            PT Goals (current goals can now be found in the care plan section) Acute Rehab PT Goals Patient Stated Goal: Go home PT Goal Formulation: With patient Time For Goal Achievement: 03/03/23 Potential to Achieve Goals: Good Progress towards PT goals: Progressing toward goals    Frequency    Min 3X/week      PT Plan Current plan remains appropriate    Co-evaluation              AM-PAC PT "6 Clicks" Mobility   Outcome Measure  Help needed turning from your back to your side while in a flat bed without using bedrails?: None Help needed moving from lying on your back to sitting on the side of a flat bed without using bedrails?: None Help needed moving to and from a bed to a chair (including a wheelchair)?: A Little Help needed standing up from a chair using your arms (e.g., wheelchair or bedside chair)?: A Little Help needed to walk in hospital room?: A Little Help needed climbing 3-5 steps with a railing? : A Little 6 Click Score: 20    End of Session Equipment Utilized During Treatment: Gait belt Activity Tolerance: Patient tolerated treatment well Patient left: in chair;with call bell/phone within reach;with chair alarm set   PT Visit Diagnosis: Unsteadiness on feet (R26.81);Other abnormalities of gait and mobility (R26.89);Muscle weakness (generalized) (M62.81);Difficulty in walking, not elsewhere classified (R26.2)     Time: 0940-1001 PT Time Calculation (min) (ACUTE ONLY): 21 min  Charges:  $Gait Training: 8-22 mins                     Gina Hunt, PT, DPT Physical Therapist Acute Rehabilitation Services [facility]    Berton Mount 02/20/2023, 10:11 AM

## 2023-02-20 NOTE — Progress Notes (Signed)
PROGRESS NOTE    Gina Hunt  ONG:[id number] DOB: Feb 12, 1946 DOA: 02/16/2023 PCP: Sharlene Dory, DO   Brief Narrative:  77 y.o. female with medical history significant of breast cancer, HTN, HLD, and hypothyroidism presented with worsening shortness of breath.  On presentation, she was hypoxic to 77% on room air requiring 4 L nasal cannula oxygen.  CTA was concerning for pleural thickening on the right with large right pleural effusion, shift of mediastinum from right to left and collapsed right lung, several left-sided lung nodules and mediastinal lymph nodes with multiple sclerotic bone lesions in the spine.  Pulmonary was consulted and patient underwent thoracentesis with removal of 1600 cc fluid on 02/17/2023 and then again on 02/18/2023 with removal of 1200 cc.  Assessment & Plan:   Large large right pleural effusion Acute respiratory failure with hypoxia History of breast cancer status post left mastectomy and chemotherapy -Most likely malignant in nature because of pleural nodules, left-sided lung nodules and bone metastases: Possible recurrence of breast cancer -Status post thoracentesis with removal of 1600 cc fluid on 02/17/2023 and then again on 02/18/2023 with removal of 1200 cc.  Cytology pending.  Follow further pulmonary recommendations.  Might need Pleurx catheter before discharge.  Currently on 1 L oxygen by nasal cannula.  Wean off as able -Oncology evaluation appreciated.  Continue tamoxifen  Hiatal hernia -Is supposed to have outpatient robotic surgical repair with Dr. Andrey Campanile.  Outpatient follow-up with general surgery  Hypertension Continue irbesartan.  Hyperlipidemia -Continue simvastatin  Hypothyroidism -Continue levothyroxine  Mood disorder -Continue duloxetine  Physical deconditioning -PT recommended outpatient PT  DVT prophylaxis:  SCDs Code Status: DNR Family Communication: None at bedside Disposition Plan: Status is: Inpatient Remains  inpatient appropriate because: Of severity of illness    Consultants: Pulmonary/oncology  Procedures: As above  Antimicrobials: None   Subjective: Patient seen and examined at bedside.  No chest pain, vomiting or fever reported.  Still mildly short of breath with exertion.  Objective: Vitals:   02/19/23 1423 02/19/23 2129 02/20/23 0538 02/20/23 0720  BP: (!) 109/58 (!) 103/56 104/64 114/61  Pulse: 92 97 87 88  Resp:  20 20   Temp: 98.6 F (37 C) 98.8 F (37.1 C) 98.6 F (37 C) 98.3 F (36.8 C)  TempSrc: Oral Axillary Oral Oral  SpO2: 92% 93% 94% 100%  Weight:      Height:       No intake or output data in the 24 hours ending 02/20/23 0802  Filed Weights   02/16/23 1525  Weight: 52.2 kg    Examination:  General: Currently on 1 L oxygen via nasal cannula.  No acute distress.  Looks chronically ill and deconditioned. respiratory: Bilateral decreased breath sounds at bases with scattered crackles CVS: Currently rate controlled; S1-S2 heard  abdominal: Soft, nontender, distended mildly; no organomegaly; normal bowel sounds are heard  extremities: No cyanosis, mild lower extremity edema present      Data Reviewed: I have personally reviewed following labs and imaging studies  CBC: Recent Labs  Lab 02/16/23 1541 02/17/23 0559 02/18/23 0209  WBC 9.9 8.2 8.6  NEUTROABS 7.4 5.9  --   HGB 14.9 14.2 13.6  HCT 45.1 42.1 40.1  MCV 95.8 94.8 94.6  PLT 401* 360 339    Basic Metabolic Panel: Recent Labs  Lab 02/16/23 1541 02/17/23 0559 02/18/23 0209  NA 136 137 135  K 4.4 3.6 4.1  CL 103 100 102  CO2 GLUCOSE 195*  153* 145*  BUN 11 7* 9  CREATININE 0.71 0.70 0.72  CALCIUM 8.6* 8.5* 8.1*    GFR: Estimated Creatinine Clearance: 42.3 mL/min (by C-G formula based on SCr of 0.72 mg/dL). Liver Function Tests: Recent Labs  Lab 02/16/23 1541 02/17/23 0559  AST 25 19  ALT 16 15  ALKPHOS 81 68  BILITOT 0.4 0.4  PROT 7.5 6.5  ALBUMIN 3.1* 2.6*     No results for input(s): "LIPASE", "AMYLASE" in the last 168 hours. No results for input(s): "AMMONIA" in the last 168 hours. Coagulation Profile: Recent Labs  Lab 02/17/23 0559  INR 1.1    Cardiac Enzymes: No results for input(s): "CKTOTAL", "CKMB", "CKMBINDEX", "TROPONINI" in the last 168 hours. BNP (last 3 results) No results for input(s): "PROBNP" in the last 8760 hours. HbA1C: No results for input(s): "HGBA1C" in the last 72 hours. CBG: No results for input(s): "GLUCAP" in the last 168 hours. Lipid Profile: No results for input(s): "CHOL", "HDL", "LDLCALC", "TRIG", "CHOLHDL", "LDLDIRECT" in the last 72 hours. Thyroid Function Tests: No results for input(s): "TSH", "T4TOTAL", "FREET4", "T3FREE", "THYROIDAB" in the last 72 hours. Anemia Panel: No results for input(s): "VITAMINB12", "FOLATE", "FERRITIN", "TIBC", "IRON", "RETICCTPCT" in the last 72 hours. Sepsis Labs: No results for input(s): "PROCALCITON", "LATICACIDVEN" in the last 168 hours.  Recent Results (from the past 240 hour(s))  SARS Coronavirus 2 by RT PCR (hospital order, performed in [facility] hospital lab) *cepheid single result test* Anterior Nasal Swab     Status: None   Collection Time: 02/16/23  6:42 PM   Specimen: Anterior Nasal Swab  Result Value Ref Range Status   SARS Coronavirus 2 by RT PCR NEGATIVE NEGATIVE Final    Comment: (NOTE) SARS-CoV-2 target nucleic acids are NOT DETECTED.  The SARS-CoV-2 RNA is generally detectable in upper and lower respiratory specimens during the acute phase of infection. The lowest concentration of SARS-CoV-2 viral copies this assay can detect is 250 copies / mL. A negative result does not preclude SARS-CoV-2 infection and should not be used as the sole basis for treatment or other patient management decisions.  A negative result may occur with improper specimen collection / handling, submission of specimen other than nasopharyngeal swab, presence of viral  mutation(s) within the areas targeted by this assay, and inadequate number of viral copies (<250 copies / mL). A negative result must be combined with clinical observations, patient history, and epidemiological information.  Fact Sheet for Patients:   RoadLapTop.co.za  Fact Sheet for Healthcare Providers: http://kim-miller.com/  This test is not yet approved or  cleared by the [place] FDA and has been authorized for detection and/or diagnosis of SARS-CoV-2 by FDA under an Emergency Use Authorization (EUA).  This EUA will remain in effect (meaning this test can be used) for the duration of the COVID-19 declaration under Section 564(b)(1) of the Act, 21 U.S.C. section 360bbb-3(b)(1), unless the authorization is terminated or revoked sooner.  Performed at [facility], [address] [place], [place] [address]   Body fluid culture w Gram Stain     Status: None (Preliminary result)   Collection Time: 02/17/23  3:35 PM   Specimen: Pleural Fluid  Result Value Ref Range Status   Specimen Description FLUID PLEURAL  Final   Special Requests NONE  Final   Gram Stain NO WBC SEEN NO ORGANISMS SEEN   Final   Culture   Final    NO GROWTH 2 DAYS Performed at [facility],  [address] [place], [place] [address]    Report Status PENDING  Incomplete         Radiology Studies: DG CHEST PORT 1 VIEW  Result Date: 02/18/2023 CLINICAL DATA:  Post thoracentesis EXAM: PORTABLE CHEST 1 VIEW COMPARISON:  02/17/2023 FINDINGS: Stable heart size. Small hiatal hernia. Aortic atherosclerosis. Moderate right pleural effusion, decreased from prior. Persistent but improving airspace opacities throughout the right lung. No pneumothorax. Left lung remains clear aside from minor left basilar atelectasis. IMPRESSION: 1. Moderate right pleural effusion, decreased following thoracentesis. No pneumothorax. 2. Persistent but improving airspace  opacities throughout the right lung. Electronically Signed   By: Duanne Guess D.O.   On: 02/18/2023 13:55        Scheduled Meds:  cycloSPORINE  1 drop Both Eyes BID   docusate sodium  100 mg Oral BID   DULoxetine  60 mg Oral BID   feeding supplement  237 mL Oral BID BM   gabapentin  300 mg Oral TID   irbesartan  150 mg Oral Daily   levothyroxine  50 mcg Oral Q0600   multivitamin with minerals  1 tablet Oral Daily   pantoprazole  40 mg Oral Daily   simvastatin  40 mg Oral Daily   sodium chloride flush  3 mL Intravenous Q12H   tamoxifen  20 mg Oral Daily   Continuous Infusions:        Glade Lloyd, MD [facility] 02/20/2023, 8:02 AM

## 2023-02-20 NOTE — Care Management Important Message (Signed)
Important Message  Patient Details  Name: Gina Hunt MRN: [id number] Date of Birth: [birth year]   Medicare Important Message Given:  Yes     Sherilyn Banker 02/20/2023, 1:05 PM

## 2023-02-20 NOTE — Progress Notes (Signed)
Patient not seen Discussed with pathology team, no updates on cytology. It will be assigned to Dr Brandy Hale, hopefully we will have more info later tomorrow. Once we have def diagnosis, we can repeat progs and plan treatment accordingly Since the nature of effusion appears to be likely malignant, I think its reasonable to consider pleurex if patient agreeable.

## 2023-02-21 DIAGNOSIS — J90 Pleural effusion, not elsewhere classified: Secondary | ICD-10-CM | POA: Diagnosis not present

## 2023-02-21 DIAGNOSIS — C50911 Malignant neoplasm of unspecified site of right female breast: Secondary | ICD-10-CM | POA: Diagnosis not present

## 2023-02-21 DIAGNOSIS — J9601 Acute respiratory failure with hypoxia: Secondary | ICD-10-CM | POA: Diagnosis not present

## 2023-02-21 NOTE — Progress Notes (Signed)
Physical Therapy Treatment Patient Details Name: Gina Hunt MRN: [id number] DOB: 1946-08-03 Today's Date: 02/21/2023   History of Present Illness 77 y.o. female presented 4/18 with SOB, hypoxia. s/p thoracentesis 4/19, 4/20. with medical history significant of breast cancer, HTN, HLD, and hypothyroidism.    PT Comments    Still making gradual progress towards functional goals. Remains motivated to work with therapies and get out of bed to mobilize. SpO2 avg 90% on RA while ambulating, mild DOE, less chest pain reported today with gait, however complains of more coughing earlier today (minimally noted during PT session today.) Stable with RW, plans to use rollator at home as needed. D/c recs updated to HHPT due to effort with gait; will be hindrance for getting to OPPT easily. Pt agreeable. Patient will continue to benefit from skilled physical therapy services to further improve independence with functional mobility.    Recommendations for follow up therapy are one component of a multi-disciplinary discharge planning process, led by the attending physician.  Recommendations may be updated based on patient status, additional functional criteria and insurance authorization.  Assistance Recommended at Discharge Set up Supervision/Assistance  Patient can return home with the following A little help with walking and/or transfers;A little help with bathing/dressing/bathroom;Assistance with cooking/housework;Assist for transportation   Equipment Recommendations  None recommended by PT    Recommendations for Other Services       Precautions / Restrictions Precautions Precautions: Fall Precaution Comments: monitor O2 Restrictions Weight Bearing Restrictions: No     Mobility  Bed Mobility Overal bed mobility: Modified Independent             General bed mobility comments: Extra time, some effort but no therapy assistance to get to EOB today.    Transfers Overall transfer level:  Needs assistance Equipment used: Rolling walker (2 wheels) Transfers: Sit to/from Stand Sit to Stand: Supervision           General transfer comment: Supervision for safety, slow to rise, stable with use of RW for light support. Using back of knees to push into bed when rising today.    Ambulation/Gait Ambulation/Gait assistance: Supervision Gait Distance (Feet): 95 Feet Assistive device: Rolling walker (2 wheels) Gait Pattern/deviations: Step-through pattern, Decreased stride length Gait velocity: decr Gait velocity interpretation: <1.31 ft/sec, indicative of household ambulator   General Gait Details: 1 standing rest break to complete distance today. On RA, avg 90% SpO2, mild DOE. Slower gait speed today. Feels confident with RW for support. No buckling or LOB noted with this device.   Stairs             Wheelchair Mobility    Modified Rankin (Stroke Patients Only)       Balance Overall balance assessment: Needs assistance Sitting-balance support: No upper extremity supported, Feet supported Sitting balance-Leahy Scale: Good     Standing balance support: No upper extremity supported Standing balance-Leahy Scale: Fair                              Cognition Arousal/Alertness: Awake/alert Behavior During Therapy: WFL for tasks assessed/performed Overall Cognitive Status: Within Functional Limits for tasks assessed                                          Exercises General Exercises - Lower Extremity Long Arc Quad: Strengthening, Both, 10 reps,  Seated    General Comments General comments (skin integrity, edema, etc.): SpO2 at rest on 1.5L 91%. Ambulating on RA 90% avg (low as 88%, high as 93%      Pertinent Vitals/Pain Pain Assessment Pain Assessment: Faces Faces Pain Scale: Hurts little more Pain Location: Rt chest area Pain Descriptors / Indicators: Sore, Constant Pain Intervention(s): Monitored during session     Home Living                          Prior Function            PT Goals (current goals can now be found in the care plan section) Acute Rehab PT Goals Patient Stated Goal: Go home PT Goal Formulation: With patient Time For Goal Achievement: 03/03/23 Potential to Achieve Goals: Good Progress towards PT goals: Progressing toward goals    Frequency    Min 3X/week      PT Plan Discharge plan needs to be updated    Co-evaluation              AM-PAC PT "6 Clicks" Mobility   Outcome Measure  Help needed turning from your back to your side while in a flat bed without using bedrails?: None Help needed moving from lying on your back to sitting on the side of a flat bed without using bedrails?: None Help needed moving to and from a bed to a chair (including a wheelchair)?: A Little Help needed standing up from a chair using your arms (e.g., wheelchair or bedside chair)?: A Little Help needed to walk in hospital room?: A Little Help needed climbing 3-5 steps with a railing? : A Little 6 Click Score: 20    End of Session Equipment Utilized During Treatment: Gait belt Activity Tolerance: Patient tolerated treatment well Patient left: in bed;with bed alarm set   PT Visit Diagnosis: Unsteadiness on feet (R26.81);Other abnormalities of gait and mobility (R26.89);Muscle weakness (generalized) (M62.81);Difficulty in walking, not elsewhere classified (R26.2)     Time: 9562-1308 PT Time Calculation (min) (ACUTE ONLY): 18 min  Charges:  $Gait Training: 8-22 mins                     Kathlyn Sacramento, PT, DPT Physical Therapist Acute Rehabilitation Services [facility]    Berton Mount 02/21/2023, 3:37 PM

## 2023-02-21 NOTE — TOC Initial Note (Signed)
Transition of Care [facility]) - Initial/Assessment Note    Patient Details  Name: Gina Hunt MRN: [id number] Date of Birth: [birth year]  Transition of Care [facility]) CM/SW Contact:    Lawerance Sabal, RN Phone Number: 02/21/2023, 10:05 AM  Clinical Narrative:                  Gina Hunt w patient and niece Gina Hunt at bedside. Patient states she will DC to home w help of niece and friends.  She has DME RW and 3/1 at home. She is interested in [facility] therapies, not outpatient and would like to use [facility].  Referral sent to liaison.  Niece will transport home      Hawkins,Gina Hunt Niece   [phone]   Expected Discharge Plan: Home w Home Health Services Barriers to Discharge: Continued Medical Work up   Patient Goals and CMS Choice Patient states their goals for this hospitalization and ongoing recovery are:: to go home CMS Medicare.gov Compare Post Acute Care list provided to:: Patient Choice offered to / list presented to : Patient      Expected Discharge Plan and Services   Discharge Planning Services: CM Consult Post Acute Care Choice: Home Health Living arrangements for the past 2 months: Single Family Home                 DME Arranged: N/A         HH Arranged: PT, OT HH Agency: [facility] Date [facility] Agency Contacted: 02/21/23 Time HH Agency Contacted: 1004 Representative spoke with at [facility] Agency: Kandee Keen  Prior Living Arrangements/Services Living arrangements for the past 2 months: Single Family Home Lives with:: Self   Do you feel safe going back to the place where you live?: Yes               Activities of Daily Living Home Assistive Devices/Equipment: Eyeglasses ADL Screening (condition at time of admission) Patient's cognitive ability adequate to safely complete daily activities?: Yes Is the patient deaf or have difficulty hearing?: No Does the patient have difficulty seeing, even when wearing glasses/contacts?: No Does the patient have difficulty  concentrating, remembering, or making decisions?: No Patient able to express need for assistance with ADLs?: Yes Does the patient have difficulty dressing or bathing?: No Independently performs ADLs?: Yes (appropriate for developmental age) Does the patient have difficulty walking or climbing stairs?: No Weakness of Legs: None Weakness of Arms/Hands: None  Permission Sought/Granted                  Emotional Assessment              Admission diagnosis:  Mediastinal shift [R93.89] Heart predominantly in right hemithorax [Q24.8] Hypoxia [R09.02] Pleural effusion on right [J90] Acute respiratory failure with hypoxia [J96.01] Patient Active Problem List   Diagnosis Date Noted   Breast carcinoma metastatic to multiple sites, right 02/18/2023   Pleural effusion on right 02/18/2023   Pleural effusion 02/17/2023   History of breast cancer 02/17/2023   Malnutrition of moderate degree 02/17/2023   Acute respiratory failure with hypoxia 02/16/2023   Seasonal affective disorder 10/14/2022   Essential hypertension 10/14/2022   Type 2 diabetes mellitus with hyperglycemia, without long-term current use of insulin 10/14/2022   Dysphagia    Gastroesophageal reflux disease    Port-A-Cath in place 05/26/2021   Genetic testing 12/29/2020   Family history of breast cancer 12/16/2020   Family history of gastric cancer 12/16/2020   Malignant neoplasm of upper-outer quadrant  of left breast in female, estrogen receptor positive 12/14/2020   DNR (do not resuscitate) 12/20/2019   Hyperlipidemia 11/12/2018   Stress incontinence 10/12/2018   Other fatigue 05/19/2017   Dyslipidemia 03/08/2017   History of peptic ulcer 03/06/2017   History of hypothyroidism 03/06/2017   Fibromyalgia 09/06/2016   Primary osteoarthritis of both knees 09/06/2016   Primary osteoarthritis of both hands 09/06/2016   DJD (degenerative joint disease), cervical 09/06/2016   Peptic ulcer disease 09/06/2016    Hypothyroidism 09/06/2016   PCP:  Sharlene Dory, DO Pharmacy:   [facility] [address] [place] - [address] [place] [address] Phone: [phone] Fax: [phone]  [facility] - [address], [place] - [address] [place] [address] Phone: [phone] Fax: [phone]     Social Determinants of Health (SDOH) Social History: SDOH Screenings   Food Insecurity: No Food Insecurity (02/17/2023)  Housing: Low Risk  (02/17/2023)  Transportation Needs: No Transportation Needs (02/17/2023)  Utilities: At Risk (02/17/2023)  Alcohol Screen: Low Risk  (01/30/2023)  Depression (PHQ2-9): High Risk (01/30/2023)  Financial Resource Strain: Medium Risk (01/30/2023)  Physical Activity: Sufficiently Active (01/28/2022)  Social Connections: Moderately Integrated (01/30/2023)  Stress: Stress Concern Present (01/30/2023)  Tobacco Use: Low Risk  (02/17/2023)   SDOH Interventions: Housing Interventions: Intervention Not Indicated   Readmission Risk Interventions     No data to display

## 2023-02-21 NOTE — Progress Notes (Signed)
Mobility Specialist Progress Note   02/21/23 1140  Mobility  Activity Ambulated with assistance in hallway  Level of Assistance Standby assist, set-up cues, supervision of patient - no hands on  Assistive Device Front wheel walker  Distance Ambulated (ft) 100 ft  Range of Motion/Exercises Active;All extremities  Activity Response Tolerated well   Patient received in supine and agreeable to participate. Ambulated supervision level with steady gait. Oxygen saturation fluctuated low 80's to mid 90's but with poor plethora reading. Discovered displacement of pulse oximeter and had improved saturation readings >95% when oximeter was corrected and replaced. Returned to room without complaint or incident. Was left dangling EOB with all needs met, call bell in reach.   Gina Hunt, BS EXP Mobility Specialist Please contact via SecureChat or Rehab office at [phone]

## 2023-02-21 NOTE — Progress Notes (Addendum)
   NAME:  Gina Hunt, MRN:  [id number], DOB:  [birth year], LOS: 4 ADMISSION DATE:  02/16/2023, CONSULTATION DATE:  02/21/2023  REFERRING MD:  Myrene Buddy, CHIEF COMPLAINT: Shortness of breath  History of Present Illness:  77 year old never smoker presents with a history of shortness of breath ongoing for 3 weeks.  She reports this has been progressive and she can barely walk a few yards now.  She reports dullness and heaviness in her chest not related to deep inspiration. She was found to be hypoxic to 77% on room air in her PCP office and sent over to the ED, required 4 L of oxygen. Chest x-ray showed complete whiteout on the right suggesting large right pleural effusion with mediastinal shift to left. CT angiogram chest showed nodular pleural thickening on the right with large effusion, shift of mediastinum from right to left and collapsed right lung, several left-sided lung nodules and mediastinal lymph nodes.  Multiple sclerotic bone lesions in the spine   Pertinent  Medical History  Left breast cancer treated 2 years ago with mastectomy and chemotherapy  Significant Hospital Events: Including procedures, antibiotic start and stop dates in addition to other pertinent events   4/19 RT thoracentesis>> 1.6 L 4/20 RT thoracentesis>> 1.2 L 4/22 Am CXR Stable, patient weaned to RA this am. Cytology for sample 4/19 and 4/20 remains pending   Interim History / Subjective:  No acute distress  Objective   Blood pressure (!) 100/53, pulse 79, temperature 98.2 F (36.8 C), resp. rate 18, height 5' (1.524 m), weight 52.2 kg, SpO2 96 %.        Intake/Output Summary (Last 24 hours) at 02/21/2023 0956 Last data filed at 02/21/2023 0900 Gross per 24 hour  Intake 120 ml  Output --  Net 120 ml   Filed Weights   02/16/23 1525  Weight: 52.2 kg    Examination: Elderly female no acute distress currently on room air Decreased breath sounds throughout Heart sounds are regular Abdomen soft  nontender Lower extremities warm and dry  Resolved Hospital Problem list     Assessment & Plan:  Large right pleural effusion S/p thoracentesis x 2 Cytology is pending Questionable malignancy and bone metastasis with questionable breast cancer recurrence Questionable need for Pleurx catheter    -Acute respiratory failure with hypoxia P: Currently on room air with sats of 92% No chest x-ray 02/21/2023 Questionable plan of action either Pleurx catheter in-house or as an outpatient again we await cytology Mobilize as needed Pulmonary hygiene     Best Practice (right click and "Reselect all SmartList Selections" daily)  Per primary    \Steve Gaspard Isbell ACNP Acute Care Nurse Practitioner [facility]/Critical [facility] Please consult Amion 02/21/2023, 10:46 AM

## 2023-02-21 NOTE — Progress Notes (Signed)
PROGRESS NOTE    Gina Hunt  ZOX:[id number] DOB: 1945/12/13 DOA: 02/16/2023 PCP: Sharlene Dory, DO   Brief Narrative:  77 y.o. female with medical history significant of breast cancer, HTN, HLD, and hypothyroidism presented with worsening shortness of breath.  On presentation, she was hypoxic to 77% on room air requiring 4 L nasal cannula oxygen.  CTA was concerning for pleural thickening on the right with large right pleural effusion, shift of mediastinum from right to left and collapsed right lung, several left-sided lung nodules and mediastinal lymph nodes with multiple sclerotic bone lesions in the spine.  Pulmonary was consulted and patient underwent thoracentesis with removal of 1600 cc fluid on 02/17/2023 and then again on 02/18/2023 with removal of 1200 cc.  Oncology was also consulted.  Assessment & Plan:   Large large right pleural effusion Acute respiratory failure with hypoxia History of breast cancer status post left mastectomy and chemotherapy -Most likely malignant in nature because of pleural nodules, left-sided lung nodules and bone metastases: Possible recurrence of breast cancer -Status post thoracentesis with removal of 1600 cc fluid on 02/17/2023 and then again on 02/18/2023 with removal of 1200 cc.  Cytology pending.  Follow further pulmonary recommendations.  Might need Pleurx catheter before discharge.  Currently on 1 L oxygen by nasal cannula.  Wean off as able -Oncology following.  Continue tamoxifen  Hiatal hernia -Is supposed to have outpatient robotic surgical repair with Dr. Andrey Campanile.  Outpatient follow-up with general surgery  Hypertension Continue irbesartan.  Hyperlipidemia -Continue simvastatin  Hypothyroidism -Continue levothyroxine  Mood disorder -Continue duloxetine  Physical deconditioning -PT recommended outpatient PT  DVT prophylaxis:  SCDs Code Status: DNR Family Communication: None at bedside Disposition Plan: Status is:  Inpatient Remains inpatient appropriate because: Of severity of illness    Consultants: Pulmonary/oncology  Procedures: As above  Antimicrobials: None   Subjective: Patient seen and examined at bedside.  Still slightly short of breath with mild exertion.  No fever, chest pain, vomiting reported.  Still feels weak.   Objective: Vitals:   02/20/23 2113 02/21/23 0528 02/21/23 0730 02/21/23 0733  BP: 107/76 97/65 (!) 100/53 (!) 100/53  Pulse: 65  79 79  Resp: 18 16 (!) 23 18  Temp: 98 F (36.7 C) 98.2 F (36.8 C) 98.2 F (36.8 C) 98.2 F (36.8 C)  TempSrc:  Oral Oral   SpO2: 95% 96% 96% 96%  Weight:      Height:       No intake or output data in the 24 hours ending 02/21/23 0811  Filed Weights   02/16/23 1525  Weight: 52.2 kg    Examination:  General: No distress.  On 1 to 2 L oxygen via nasal cannula.  Looks chronically ill and deconditioned. respiratory: Decreased breath sounds at bases bilaterally with some crackles and intermittent tachypnea  CVS: S1 and S2 are heard; rate mostly controlled  abdominal: Soft, nontender, has some distention; no organomegaly; bowel sounds normally heard extremities: Trace lower extremity edema present.  No clubbing      Data Reviewed: I have personally reviewed following labs and imaging studies  CBC: Recent Labs  Lab 02/16/23 1541 02/17/23 0559 02/18/23 0209  WBC 9.9 8.2 8.6  NEUTROABS 7.4 5.9  --   HGB 14.9 14.2 13.6  HCT 45.1 42.1 40.1  MCV 95.8 94.8 94.6  PLT 401* 360 339    Basic Metabolic Panel: Recent Labs  Lab 02/16/23 1541 02/17/23 0559 02/18/23 0209  NA 136 137 135  K 4.4 3.6 4.1  CL 103 100 102  CO2 GLUCOSE 195* 153* 145*  BUN 11 7* 9  CREATININE 0.71 0.70 0.72  CALCIUM 8.6* 8.5* 8.1*    GFR: Estimated Creatinine Clearance: 42.3 mL/min (by C-G formula based on SCr of 0.72 mg/dL). Liver Function Tests: Recent Labs  Lab 02/16/23 1541 02/17/23 0559  AST 25 19  ALT 16 15  ALKPHOS  81 68  BILITOT 0.4 0.4  PROT 7.5 6.5  ALBUMIN 3.1* 2.6*    No results for input(s): "LIPASE", "AMYLASE" in the last 168 hours. No results for input(s): "AMMONIA" in the last 168 hours. Coagulation Profile: Recent Labs  Lab 02/17/23 0559  INR 1.1    Cardiac Enzymes: No results for input(s): "CKTOTAL", "CKMB", "CKMBINDEX", "TROPONINI" in the last 168 hours. BNP (last 3 results) No results for input(s): "PROBNP" in the last 8760 hours. HbA1C: No results for input(s): "HGBA1C" in the last 72 hours. CBG: No results for input(s): "GLUCAP" in the last 168 hours. Lipid Profile: No results for input(s): "CHOL", "HDL", "LDLCALC", "TRIG", "CHOLHDL", "LDLDIRECT" in the last 72 hours. Thyroid Function Tests: No results for input(s): "TSH", "T4TOTAL", "FREET4", "T3FREE", "THYROIDAB" in the last 72 hours. Anemia Panel: No results for input(s): "VITAMINB12", "FOLATE", "FERRITIN", "TIBC", "IRON", "RETICCTPCT" in the last 72 hours. Sepsis Labs: No results for input(s): "PROCALCITON", "LATICACIDVEN" in the last 168 hours.  Recent Results (from the past 240 hour(s))  SARS Coronavirus 2 by RT PCR (hospital order, performed in [facility] hospital lab) *cepheid single result test* Anterior Nasal Swab     Status: None   Collection Time: 02/16/23  6:42 PM   Specimen: Anterior Nasal Swab  Result Value Ref Range Status   SARS Coronavirus 2 by RT PCR NEGATIVE NEGATIVE Final    Comment: (NOTE) SARS-CoV-2 target nucleic acids are NOT DETECTED.  The SARS-CoV-2 RNA is generally detectable in upper and lower respiratory specimens during the acute phase of infection. The lowest concentration of SARS-CoV-2 viral copies this assay can detect is 250 copies / mL. A negative result does not preclude SARS-CoV-2 infection and should not be used as the sole basis for treatment or other patient management decisions.  A negative result may occur with improper specimen collection / handling, submission of  specimen other than nasopharyngeal swab, presence of viral mutation(s) within the areas targeted by this assay, and inadequate number of viral copies (<250 copies / mL). A negative result must be combined with clinical observations, patient history, and epidemiological information.  Fact Sheet for Patients:   RoadLapTop.co.za  Fact Sheet for Healthcare Providers: http://kim-miller.com/  This test is not yet approved or  cleared by the [place] FDA and has been authorized for detection and/or diagnosis of SARS-CoV-2 by FDA under an Emergency Use Authorization (EUA).  This EUA will remain in effect (meaning this test can be used) for the duration of the COVID-19 declaration under Section 564(b)(1) of the Act, 21 U.S.C. section 360bbb-3(b)(1), unless the authorization is terminated or revoked sooner.  Performed at [facility], [address] [place], [place] [address]   Body fluid culture w Gram Stain     Status: None (Preliminary result)   Collection Time: 02/17/23  3:35 PM   Specimen: Pleural Fluid  Result Value Ref Range Status   Specimen Description FLUID PLEURAL  Final   Special Requests NONE  Final   Gram Stain NO WBC SEEN NO ORGANISMS SEEN   Final  Culture   Final    NO GROWTH 3 DAYS Performed at [facility], [address], [place], [place] [address]    Report Status PENDING  Incomplete         Radiology Studies: DG CHEST PORT 1 VIEW  Result Date: 02/20/2023 CLINICAL DATA:  Pleural effusion EXAM: PORTABLE CHEST 1 VIEW COMPARISON:  Portable exam 1211 hours compared to 02/18/2023 FINDINGS: Normal heart size and pulmonary vascularity. Atherosclerotic calcification aorta. Moderate-sized hiatal hernia. Moderate RIGHT pleural effusion and basilar atelectasis. Coexistent infiltrate RIGHT lung and LEFT lower lobe atelectasis. No pneumothorax or acute osseous findings. Diffuse osseous demineralization.  IMPRESSION: Moderate LEFT pleural effusion and basilar atelectasis. Subsegmental atelectasis LEFT lower lobe with additional RIGHT lung infiltrate which could represent asymmetric edema or infection. Electronically Signed   By: Ulyses Southward M.D.   On: 02/20/2023 13:14        Scheduled Meds:  cycloSPORINE  1 drop Both Eyes BID   docusate sodium  100 mg Oral BID   DULoxetine  60 mg Oral BID   feeding supplement  237 mL Oral BID BM   gabapentin  300 mg Oral TID   irbesartan  150 mg Oral Daily   levothyroxine  50 mcg Oral Q0600   multivitamin with minerals  1 tablet Oral Daily   pantoprazole  40 mg Oral Daily   simvastatin  40 mg Oral Daily   sodium chloride flush  3 mL Intravenous Q12H   tamoxifen  20 mg Oral Daily   Continuous Infusions:        Glade Lloyd, MD [facility] 02/21/2023, 8:11 AM

## 2023-02-22 ENCOUNTER — Encounter (HOSPITAL_COMMUNITY): Payer: Self-pay | Admitting: Internal Medicine

## 2023-02-22 ENCOUNTER — Telehealth: Payer: Self-pay | Admitting: Pulmonary Disease

## 2023-02-22 ENCOUNTER — Inpatient Hospital Stay (HOSPITAL_COMMUNITY): Payer: Medicare HMO

## 2023-02-22 ENCOUNTER — Encounter (HOSPITAL_COMMUNITY)
Admission: EM | Disposition: A | Discharge status: Home Health | Payer: Self-pay | Source: Ambulatory Visit | Attending: Internal Medicine

## 2023-02-22 DIAGNOSIS — J9601 Acute respiratory failure with hypoxia: Secondary | ICD-10-CM | POA: Diagnosis not present

## 2023-02-22 HISTORY — PX: CHEST TUBE INSERTION: SHX231

## 2023-02-22 LAB — CBC WITH DIFFERENTIAL/PLATELET
Abs Immature Granulocytes: 0.03 10*3/uL (ref 0.00–0.07)
Basophils Absolute: 0 10*3/uL (ref 0.0–0.1)
Basophils Relative: 1 %
Eosinophils Absolute: 0.3 10*3/uL (ref 0.0–0.5)
Eosinophils Relative: 4 %
HCT: 38.1 % (ref 36.0–46.0)
Hemoglobin: 12.7 g/dL (ref 12.0–15.0)
Immature Granulocytes: 0 %
Lymphocytes Relative: 11 %
Lymphs Abs: 0.9 10*3/uL (ref 0.7–4.0)
MCH: 31.7 pg (ref 26.0–34.0)
MCHC: 33.3 g/dL (ref 30.0–36.0)
MCV: 95 fL (ref 80.0–100.0)
Monocytes Absolute: 0.9 10*3/uL (ref 0.1–1.0)
Monocytes Relative: 10 %
Neutro Abs: 6.2 10*3/uL (ref 1.7–7.7)
Neutrophils Relative %: 74 %
Platelets: 341 10*3/uL (ref 150–400)
RBC: 4.01 MIL/uL (ref 3.87–5.11)
RDW: 12.7 % (ref 11.5–15.5)
WBC: 8.3 10*3/uL (ref 4.0–10.5)
nRBC: 0 % (ref 0.0–0.2)

## 2023-02-22 LAB — BASIC METABOLIC PANEL
Anion gap: 9 (ref 5–15)
BUN: 9 mg/dL (ref 8–23)
CO2: 25 mmol/L (ref 22–32)
Calcium: 7.8 mg/dL — ABNORMAL LOW (ref 8.9–10.3)
Chloride: 96 mmol/L — ABNORMAL LOW (ref 98–111)
Creatinine, Ser: 0.57 mg/dL (ref 0.44–1.00)
GFR, Estimated: >60 mL/min (ref >60)
Glucose, Bld: 142 mg/dL — ABNORMAL HIGH (ref 70–99)
Potassium: 4 mmol/L (ref 3.5–5.1)
Sodium: 130 mmol/L — ABNORMAL LOW (ref 135–145)

## 2023-02-22 LAB — PROTIME-INR
INR: 1.2 (ref 0.8–1.2)
Prothrombin Time: 14.9 seconds (ref 11.4–15.2)

## 2023-02-22 LAB — MAGNESIUM: Magnesium: 1.9 mg/dL (ref 1.7–2.4)

## 2023-02-22 LAB — CYTOLOGY - NON PAP

## 2023-02-22 SURGERY — INSERTION, PLEURAL DRAINAGE CATHETER
Anesthesia: LOCAL

## 2023-02-22 MED ORDER — LACTATED RINGERS IV SOLN: Freq: Once | INTRAVENOUS | Status: AC

## 2023-02-22 MED ORDER — LIDOCAINE 5 % EX PTCH
1.0000 | MEDICATED_PATCH | CUTANEOUS | Status: DC
  Administered 2023-02-22: 1 via TRANSDERMAL
  Filled 2023-02-22: qty 1

## 2023-02-22 MED ORDER — LIDOCAINE HCL (PF) 2 % IJ SOLN
INTRAMUSCULAR | Status: AC
  Filled 2023-02-22: qty 10

## 2023-02-22 NOTE — Progress Notes (Signed)
OT Cancellation Note  Patient Details Name: Gina Hunt MRN: [id number] DOB: [birth year]   Cancelled Treatment:    Reason Eval/Treat Not Completed: Patient at procedure or test/ unavailable (Pt at endoscopy. Will follow up later time)  [facility] 02/22/2023, 2:26 PM Luisa Dago, OT/L   Acute OT Clinical Specialist Acute Rehabilitation Services Pager [phone] Office [phone]

## 2023-02-22 NOTE — Op Note (Signed)
PleurX Insertion Procedure Note  Gina Hunt  [id number]  03-Mar-1946  Date:02/22/23  Time:2:43 PM   Provider Performing:Breena Bevacqua B [name]  Procedure: PleurX Tunneled Pleural Catheter Placement (32550)  Indication(s) Relief of dyspnea from recurrent effusion  Consent Risks of the procedure as well as the alternatives and risks of each were explained to the patient and/or caregiver.  Consent for the procedure was obtained.   Anesthesia Topical only with 1% lidocaine    Time Out Verified patient identification, verified procedure, site/side was marked, verified correct patient position, special equipment/implants available, medications/allergies/relevant history reviewed, required imaging and test results available.   Sterile Technique Maximal sterile technique including sterile barrier drape, hand hygiene, sterile gown, sterile gloves, mask, hair covering.    Procedure Description Ultrasound used to identify appropriate pleural anatomy for placement and overlying skin marked.  Area of drainage cleaned and draped in sterile fashion.   Lidocaine was used to anesthetize the skin and subcutaneous tissue.   1.5 cm incision made overlying fluid and another about 5 cm anterior to this along chest wall.  PleurX catheter inserted in usual sterile fashion using modified seldinger technique.  Interrupted silk sutures placed at catheter insertion and tunneling points which will be removed at later date.  PleurX catheter then hooked to suction, drained.  After fluid aspirated, pleurX capped and sterile dressing applied.   Complications/Tolerance None; patient tolerated the procedure well. Chest X-ray is ordered to confirm no post-procedural complication.   EBL Minimal   Specimen(s) none

## 2023-02-22 NOTE — Telephone Encounter (Signed)
Please schedule patient for follow up with me on 5/3 for PleurX suture removal.  Thanks, JD

## 2023-02-22 NOTE — Progress Notes (Signed)
   NAME:  Gina Hunt, MRN:  [id number], DOB:  [birth year], LOS: 5 ADMISSION DATE:  02/16/2023, CONSULTATION DATE:  02/22/2023  REFERRING MD:  Myrene Buddy, CHIEF COMPLAINT: Shortness of breath  History of Present Illness:  77 year old never smoker presents with a history of shortness of breath ongoing for 3 weeks.  She reports this has been progressive and she can barely walk a few yards now.  She reports dullness and heaviness in her chest not related to deep inspiration. She was found to be hypoxic to 77% on room air in her PCP office and sent over to the ED, required 4 L of oxygen. Chest x-ray showed complete whiteout on the right suggesting large right pleural effusion with mediastinal shift to left. CT angiogram chest showed nodular pleural thickening on the right with large effusion, shift of mediastinum from right to left and collapsed right lung, several left-sided lung nodules and mediastinal lymph nodes.  Multiple sclerotic bone lesions in the spine   Pertinent  Medical History  Left breast cancer treated 2 years ago with mastectomy and chemotherapy  Significant Hospital Events: Including procedures, antibiotic start and stop dates in addition to other pertinent events   4/19 RT thoracentesis>> 1.6 L 4/20 RT thoracentesis>> 1.2 L 4/22 Am CXR Stable, patient weaned to RA this am. Cytology for sample 4/19 and 4/20 remains pending  4/23 pleural fluid cytology with metastatic adenocarcinoma of the breast  Interim History / Subjective:  No acute distress Dyspnea with exertion Reports some nausea this morning  Objective   Blood pressure 108/65, pulse 83, temperature 97.8 F (36.6 C), temperature source Oral, resp. rate 18, height 5' (1.524 m), weight 52.2 kg, SpO2 96 %.        Intake/Output Summary (Last 24 hours) at 02/22/2023 0840 Last data filed at 02/21/2023 1500 Gross per 24 hour  Intake 480 ml  Output --  Net 480 ml   Filed Weights   02/16/23 1525  Weight: 52.2 kg     Examination: Elderly female no acute distress, resting in bed Decreased breath sounds on right Heart sounds are regular Abdomen soft nontender Lower extremities warm and dry  Resolved Hospital Problem list     Assessment & Plan:  Malignant Right Pleural Effusion, metastatic breast adenocarcinoma - s/p thoracentesis x 2 - both pleural fluid samples positive for metastatic breast adenocarcinoma - plan for pleurX placement this afternoon - Check CBC and INR - Patient has RN friends that will assist her with drainage at home if needed - Checking with CM about home health assistance for pleurX as well  -Acute respiratory failure with hypoxia P: - in setting of large right malignant effusion - Mobilize as needed - Pulmonary hygiene  Best Practice (right click and "Reselect all SmartList Selections" daily)  Per primary    Melody Comas, MD [facility] Office: [phone]   See Amion for personal pager PCCM on call pager [phone] until 7pm. Please call Elink 7p-7a. [phone]

## 2023-02-22 NOTE — Progress Notes (Signed)
PROGRESS NOTE    Gina Hunt  ZOX:[id number] DOB: [birth year] DOA: 02/16/2023 PCP: Sharlene Dory, DO    Brief Narrative:  77 y.o. female with medical history significant of breast cancer, HTN, HLD, and hypothyroidism presented with worsening shortness of breath.  On presentation, she was hypoxic to 77% on room air requiring 4 L nasal cannula oxygen.  CTA was concerning for pleural thickening on the right with large right pleural effusion, shift of mediastinum from right to left and collapsed right lung, several left-sided lung nodules and mediastinal lymph nodes with multiple sclerotic bone lesions in the spine.  Pulmonary was consulted and patient underwent thoracentesis with removal of 1600 cc fluid on 02/17/2023 and then again on 02/18/2023 with removal of 1200 cc.  Oncology was also consulted.   Cytology positive for malignancy.  Breast primary.  This is a recurrence.  Pulmonary planning Pleurx catheter placement today 4/24.   Assessment & Plan:   Principal Problem:   Acute respiratory failure with hypoxia Active Problems:   Hyperlipidemia   Malignant neoplasm of upper-outer quadrant of left breast in female, estrogen receptor positive   Essential hypertension   Type 2 diabetes mellitus with hyperglycemia, without long-term current use of insulin   Pleural effusion   History of breast cancer   Malnutrition of moderate degree   Breast carcinoma metastatic to multiple sites, right   Pleural effusion on right  Large large right pleural effusion Acute respiratory failure with hypoxia History of breast cancer status post left mastectomy and chemotherapy Cytology positive for malignancy.  Breast primary.  This is a recurrence.  Pulmonary planning for Pleurx catheter placement 4/24.  Patient remains on 2 L.  If patient remains stable can likely discharge home 4/25 with outpatient oncology follow-up.  TOC assisted in [place] catheter supplies.  Patient has RN friends that can help  her with the Pleurx at home.  Hiatal hernia -Is supposed to have outpatient robotic surgical repair with Dr. Andrey Campanile.  Outpatient follow-up with general surgery   Hypertension Continue irbesartan.   Hyperlipidemia -Continue simvastatin   Hypothyroidism -Continue levothyroxine   Mood disorder -Continue duloxetine   Physical deconditioning -PT recommended outpatient PT  DVT prophylaxis: SCD Code Status: DNR Family Communication: None today Disposition Plan: Status is: Inpatient Remains inpatient appropriate because: Pleurx catheter to be placed today.  Mild hypoxia.  Discharged on 4/25 if able to wean from supplemental oxygen.   Level of care: Med-Surg  Consultants:  Pulmonology  Procedures:  Pleurx catheter placement 4/24 Thoracentesis 4/22  Antimicrobials: None   Subjective: Seen and examined.  Appears frail but overall stable.  No pain complaints.  Objective: Vitals:   02/22/23 0840 02/22/23 1313 02/22/23 1320 02/22/23 1420  BP: 108/65 (!) 111/59 93/61 (!) 89/51  Pulse: 83 81 83   Resp:  20 (!) 24   Temp: 97.8 F (36.6 C)     TempSrc: Oral     SpO2: 96% 95% 96%   Weight:  52.2 kg    Height:  5' (1.524 m)      Intake/Output Summary (Last 24 hours) at 02/22/2023 1443 Last data filed at 02/21/2023 1500 Gross per 24 hour  Intake 360 ml  Output --  Net 360 ml   Filed Weights   02/16/23 1525 02/22/23 1313  Weight: 52.2 kg 52.2 kg    Examination:  General exam: Appears calm and comfortable  Respiratory system: Bilateral crackles left greater than right.  Normal work of breathing.  2 L Cardiovascular system:  S1-S2, RRR, no murmurs, no pedal edema Gastrointestinal system: Thin, soft, NT/ND, normal bowel sounds Central nervous system: Alert and oriented. No focal neurological deficits. Extremities: Symmetric 5 x 5 power. Skin: No rashes, lesions or ulcers Psychiatry: Judgement and insight appear normal. Mood & affect appropriate.     Data Reviewed:  I have personally reviewed following labs and imaging studies  CBC: Recent Labs  Lab 02/16/23 1541 02/17/23 0559 02/18/23 0209 02/22/23 0919  WBC 9.9 8.2 8.6 8.3  NEUTROABS 7.4 5.9  --  6.2  HGB 14.9 14.2 13.6 12.7  HCT 45.1 42.1 40.1 38.1  MCV 95.8 94.8 94.6 95.0  PLT 401* 360 339 341   Basic Metabolic Panel: Recent Labs  Lab 02/16/23 1541 02/17/23 0559 02/18/23 0209 02/22/23 0251  NA 136 137 135 130*  K 4.4 3.6 4.1 4.0  CL 103 100 102 96*  CO2 GLUCOSE 195* 153* 145* 142*  BUN 11 7* 9 9  CREATININE 0.71 0.70 0.72 0.57  CALCIUM 8.6* 8.5* 8.1* 7.8*  MG  --   --   --  1.9   GFR: Estimated Creatinine Clearance: 42.3 mL/min (by C-G formula based on SCr of 0.57 mg/dL). Liver Function Tests: Recent Labs  Lab 02/16/23 1541 02/17/23 0559  AST 25 19  ALT 16 15  ALKPHOS 81 68  BILITOT 0.4 0.4  PROT 7.5 6.5  ALBUMIN 3.1* 2.6*   No results for input(s): "LIPASE", "AMYLASE" in the last 168 hours. No results for input(s): "AMMONIA" in the last 168 hours. Coagulation Profile: Recent Labs  Lab 02/17/23 0559 02/22/23 0919  INR 1.1 1.2   Cardiac Enzymes: No results for input(s): "CKTOTAL", "CKMB", "CKMBINDEX", "TROPONINI" in the last 168 hours. BNP (last 3 results) No results for input(s): "PROBNP" in the last 8760 hours. HbA1C: No results for input(s): "HGBA1C" in the last 72 hours. CBG: No results for input(s): "GLUCAP" in the last 168 hours. Lipid Profile: No results for input(s): "CHOL", "HDL", "LDLCALC", "TRIG", "CHOLHDL", "LDLDIRECT" in the last 72 hours. Thyroid Function Tests: No results for input(s): "TSH", "T4TOTAL", "FREET4", "T3FREE", "THYROIDAB" in the last 72 hours. Anemia Panel: No results for input(s): "VITAMINB12", "FOLATE", "FERRITIN", "TIBC", "IRON", "RETICCTPCT" in the last 72 hours. Sepsis Labs: No results for input(s): "PROCALCITON", "LATICACIDVEN" in the last 168 hours.  Recent Results (from the past 240 hour(s))  SARS  Coronavirus 2 by RT PCR (hospital order, performed in [facility] hospital lab) *cepheid single result test* Anterior Nasal Swab     Status: None   Collection Time: 02/16/23  6:42 PM   Specimen: Anterior Nasal Swab  Result Value Ref Range Status   SARS Coronavirus 2 by RT PCR NEGATIVE NEGATIVE Final    Comment: (NOTE) SARS-CoV-2 target nucleic acids are NOT DETECTED.  The SARS-CoV-2 RNA is generally detectable in upper and lower respiratory specimens during the acute phase of infection. The lowest concentration of SARS-CoV-2 viral copies this assay can detect is 250 copies / mL. A negative result does not preclude SARS-CoV-2 infection and should not be used as the sole basis for treatment or other patient management decisions.  A negative result may occur with improper specimen collection / handling, submission of specimen other than nasopharyngeal swab, presence of viral mutation(s) within the areas targeted by this assay, and inadequate number of viral copies (<250 copies / mL). A negative result must be combined with clinical observations, patient history, and epidemiological information.  Fact Sheet for Patients:   RoadLapTop.co.za  Fact Sheet for Healthcare Providers: http://kim-miller.com/  This test is not yet approved or  cleared by the [place] FDA and has been authorized for detection and/or diagnosis of SARS-CoV-2 by FDA under an Emergency Use Authorization (EUA).  This EUA will remain in effect (meaning this test can be used) for the duration of the COVID-19 declaration under Section 564(b)(1) of the Act, 21 U.S.C. section 360bbb-3(b)(1), unless the authorization is terminated or revoked sooner.  Performed at [facility], [address] [place], [place] [address]   Body fluid culture w Gram Stain     Status: None   Collection Time: 02/17/23  3:35 PM   Specimen: Pleural Fluid  Result Value Ref Range  Status   Specimen Description FLUID PLEURAL  Final   Special Requests NONE  Final   Gram Stain NO WBC SEEN NO ORGANISMS SEEN   Final   Culture   Final    NO GROWTH 3 DAYS Performed at [facility], [address] [place], [place] [address]    Report Status 02/21/2023 FINAL  Final         Radiology Studies: No results found.      Scheduled Meds:  [MAR Hold] cycloSPORINE  1 drop Both Eyes BID   [MAR Hold] docusate sodium  100 mg Oral BID   [MAR Hold] DULoxetine  60 mg Oral BID   [MAR Hold] feeding supplement  237 mL Oral BID BM   [MAR Hold] gabapentin  300 mg Oral TID   [MAR Hold] irbesartan  150 mg Oral Daily   [MAR Hold] levothyroxine  50 mcg Oral Q0600   [MAR Hold] multivitamin with minerals  1 tablet Oral Daily   [MAR Hold] pantoprazole  40 mg Oral Daily   [MAR Hold] simvastatin  40 mg Oral Daily   [MAR Hold] sodium chloride flush  3 mL Intravenous Q12H   [MAR Hold] tamoxifen  20 mg Oral Daily   Continuous Infusions:   LOS: 5 days     Tresa Moore, MD [facility]   If 7PM-7AM, please contact night-coverage  02/22/2023, 2:43 PM

## 2023-02-22 NOTE — Progress Notes (Signed)
Mobility Specialist Progress Note   02/22/23 1020  Mobility  Activity Ambulated with assistance in hallway  Level of Assistance Standby assist, set-up cues, supervision of patient - no hands on  Assistive Device Front wheel walker  Distance Ambulated (ft) 130 ft  Range of Motion/Exercises Active;All extremities  Activity Response Tolerated well   Pre Ambulation: SpO2 95% 2LO2 During Ambulation: SpO2 88-90% RA; 91-93% 1LO2 Post Ambulation: SpO2 93% 2LO2  Patient received in supine and agreeable to participate. Ambulated supervision level with slow steady gait. Oxygen saturation fluctuated between 88-90% on room air and was asymptomatic. 1LO2 was given for comfort and oxygen saturation maintained >91% throughout. Returned to room without complaint or incident. Was left in recliner with all needs met, call bell in reach.   [place] Ertha Nabor, BS EXP Mobility Specialist Please contact via SecureChat or Rehab office at [phone]

## 2023-02-22 NOTE — Progress Notes (Signed)
Pt bp 75/46, 500cc lv ordered by Dr Francine Graven

## 2023-02-22 NOTE — TOC Progression Note (Addendum)
Transition of Care [facility]) - Progression Note    Patient Details  Name: Gina Hunt MRN: [id number] Date of Birth: [birth year]  [facility]) CM/SW Contact  Harriet Masson, RN Phone Number: 02/22/2023, 12:28 PM  Clinical Narrative:    Spoke to patient regarding transition needs.  Patient states she has 2 friends that are RNs that will be doing the pleurX catheter draining and care.  This RNCM notified patient that friends will need to learn how to do the care from bedside RN prior to discharge.  Cory with [facility] stated start of care will need to be Monday -Friday. MD is aware. Anticipated discharge date is 02/23/23 with start of Home health care on 02/24/23.  Case of pleurX draining kit has been order by Camera operator. TOC following. .   1530 pluerX drainage order form faxed to number on form.  Expected Discharge Plan: Home w Home Health Services Barriers to Discharge: Continued Medical Work up  Expected Discharge Plan and Services   Discharge Planning Services: CM Consult Post Acute Care Choice: Home Health Living arrangements for the past 2 months: Single Family Home                 DME Arranged: N/A         HH Arranged: RN, PT, OT HH Agency: [facility] Date [facility] Agency Contacted: 02/22/23 Time HH Agency Contacted: 1228 Representative spoke with at [facility] Agency: Kandee Keen   Social Determinants of Health (SDOH) Interventions SDOH Screenings   Food Insecurity: No Food Insecurity (02/17/2023)  Housing: Low Risk  (02/17/2023)  Transportation Needs: No Transportation Needs (02/17/2023)  Utilities: At Risk (02/17/2023)  Alcohol Screen: Low Risk  (01/30/2023)  Depression (PHQ2-9): High Risk (01/30/2023)  Financial Resource Strain: Medium Risk (01/30/2023)  Physical Activity: Sufficiently Active (01/28/2022)  Social Connections: Moderately Integrated (01/30/2023)  Stress: Stress Concern Present (01/30/2023)  Tobacco Use: Low Risk  (02/17/2023)    Readmission Risk  Interventions     No data to display

## 2023-02-23 ENCOUNTER — Telehealth: Payer: Self-pay | Admitting: Hematology and Oncology

## 2023-02-23 DIAGNOSIS — J90 Pleural effusion, not elsewhere classified: Secondary | ICD-10-CM | POA: Diagnosis not present

## 2023-02-23 DIAGNOSIS — J9601 Acute respiratory failure with hypoxia: Secondary | ICD-10-CM | POA: Diagnosis not present

## 2023-02-23 LAB — CYTOLOGY - NON PAP

## 2023-02-23 MED ORDER — OXYCODONE HCL 5 MG PO TABS: 5.0000 mg | ORAL_TABLET | Freq: Four times a day (QID) | ORAL | 0 refills | Status: DC | PRN

## 2023-02-23 NOTE — Progress Notes (Signed)
   02/23/23 1300  Spiritual Encounters  Type of Visit Initial  Care provided to: Pt and family  Referral source Patient request  Reason for visit Routine spiritual support  OnCall Visit No   Ch responded to request for support. Pt wanted to know if Ch  could notarize personal living will. Ch connected pt and family to needed resources. No follow-up needed at this time.

## 2023-02-23 NOTE — Discharge Summary (Signed)
Physician Discharge Summary   Gina Hunt NWG:[id number] DOB: 24-Jun-1946 DOA: 02/16/2023  PCP: Sharlene Dory, DO  Admit date: 02/16/2023 Discharge date: 02/23/2023   Admitted From: Home Disposition:  Home Discharging physician: Lewie Chamber, MD Barriers to discharge: none  Recommendations at discharge: Follow up with oncology Follow up with pulmonology  Discharge Condition: stable CODE STATUS: Full Diet recommendation:  Diet Orders (From admission, onward)     Start     Ordered   02/23/23 0000  Diet general        02/23/23 1232   02/17/23 0927  Diet regular Room service appropriate? Yes; Fluid consistency: Thin  Diet effective now       Question Answer Comment  Room service appropriate? Yes   Fluid consistency: Thin      02/17/23 5784            Hospital Course:  Large large right pleural effusion Acute respiratory failure with hypoxia History of breast cancer status post left mastectomy and chemotherapy Cytology positive for malignancy.  Breast primary.  This is a recurrence.  Pulmonary placed Pleurx catheter 4/24.   - weaned off O2 prior to discharge  -Outpatient follow-up with pulmonology and oncology   Hiatal hernia -Is supposed to have outpatient robotic surgical repair with Dr. Andrey Campanile.  Outpatient follow-up with general surgery   Hypertension Continue irbesartan.   Hyperlipidemia -Continue simvastatin   Hypothyroidism -Continue levothyroxine   Mood disorder -Continue duloxetine   Physical deconditioning -PT recommended outpatient PT   The patient's chronic medical conditions were treated accordingly per the patient's home medication regimen except as noted.  On day of discharge, patient was felt deemed stable for discharge. Patient/family member advised to call PCP or come back to ER if needed.   Principal Diagnosis: Acute respiratory failure with hypoxia [facility])  Discharge Diagnoses: Active Hospital Problems   Diagnosis Date  Noted   Acute respiratory failure with hypoxia (HCC) 02/16/2023   Breast carcinoma metastatic to multiple sites, right 02/18/2023   Pleural effusion on right 02/18/2023   Pleural effusion 02/17/2023   History of breast cancer 02/17/2023   Malnutrition of moderate degree (HCC) 02/17/2023   Essential hypertension 10/14/2022   Type 2 diabetes mellitus with hyperglycemia, without long-term current use of insulin 10/14/2022   Malignant neoplasm of upper-outer quadrant of left breast in female, estrogen receptor positive 12/14/2020   Hyperlipidemia 11/12/2018    Resolved Hospital Problems  No resolved problems to display.     Discharge Instructions     Ambulatory Pleural Drainage Schedule   Complete by: As directed    Drain daily, up to max of 1L until patient is only able to drain out . If <120ml for 3 consecutive drains every other day, then call Interventional Radiology [phone]) for evaluation and possible removal.   Diet general   Complete by: As directed    Increase activity slowly   Complete by: As directed       Allergies as of 02/23/2023       Reactions   Codeine Other (See Comments)   Hallucination  Other reaction(s): Respiratory Distress   Morphine And Related Other (See Comments)   Knocks me out per pt        Medication List     TAKE these medications    cycloSPORINE 0.05 % ophthalmic emulsion Commonly known as: RESTASIS Place 1 drop into both eyes 2 (two) times daily.   dicyclomine 10 MG capsule Commonly known as: BENTYL Take 1 capsule (10  mg total) by mouth every 6 (six) hours as needed for spasms.   DULoxetine 60 MG capsule Commonly known as: CYMBALTA TAKE 1 CAPSULE TWICE DAILY   esomeprazole 40 MG capsule Commonly known as: NexIUM Take 1 capsule (40 mg total) by mouth daily.   gabapentin 300 MG capsule Commonly known as: NEURONTIN Take 1 capsule (300 mg total) by mouth 3 (three) times daily.   levothyroxine 50 MCG tablet Commonly  known as: SYNTHROID TAKE 1 TABLET EVERY DAY BEFORE BREAKFAST What changed: See the new instructions.   olmesartan 20 MG tablet Commonly known as: BENICAR Take 1 tablet (20 mg total) by mouth daily.   oxyCODONE 5 MG immediate release tablet Commonly known as: Oxy IR/ROXICODONE Take 1 tablet (5 mg total) by mouth every 6 (six) hours as needed for moderate pain.   simvastatin 40 MG tablet Commonly known as: ZOCOR TAKE 1 TABLET EVERY DAY   tamoxifen 20 MG tablet Commonly known as: NOLVADEX TAKE 1 TABLET EVERY DAY   traMADol 50 MG tablet Commonly known as: ULTRAM Take 50 mg by mouth every 6 (six) hours as needed for moderate pain.        Follow-up Information     [facility] Follow up.   Specialty: Home Health Services Why: for home health services, they will call you in 1-2 days to set up a time to come to your house Contact information: [address] [place] [address] [phone]                Allergies  Allergen Reactions   Codeine Other (See Comments)    Hallucination  Other reaction(s): Respiratory Distress   Morphine And Related Other (See Comments)    Knocks me out per pt    Consultations: Pulmonology  Procedures: 02/22/2023: Pleurx catheter placement, right  Discharge Exam: BP (!) 102/57 (BP Location: Left Arm)   Pulse 81   Temp 98.5 F (36.9 C)   Resp 18   Ht 5' (1.524 m)   Wt 52.2 kg   SpO2 95%   BMI 22.48 kg/m  Physical Exam Constitutional:      Appearance: Normal appearance.  HENT:     Head: Normocephalic and atraumatic.     Mouth/Throat:     Mouth: Mucous membranes are moist.  Eyes:     Extraocular Movements: Extraocular movements intact.  Cardiovascular:     Rate and Rhythm: Normal rate and regular rhythm.  Pulmonary:     Effort: Pulmonary effort is normal. No respiratory distress.     Breath sounds: Normal breath sounds.     Comments: PleurX cath noted right posterior chest Abdominal:      General: Bowel sounds are normal. There is no distension.     Palpations: Abdomen is soft.     Tenderness: There is no abdominal tenderness.  Musculoskeletal:        General: Normal range of motion.     Cervical back: Normal range of motion and neck supple.  Skin:    General: Skin is warm and dry.  Neurological:     General: No focal deficit present.     Mental Status: She is alert.  Psychiatric:        Mood and Affect: Mood normal.      The results of significant diagnostics from this hospitalization (including imaging, microbiology, ancillary and laboratory) are listed below for reference.   Microbiology: Recent Results (from the past 240 hour(s))  SARS Coronavirus 2 by RT  PCR (hospital order, performed in [facility] hospital lab) *cepheid single result test* Anterior Nasal Swab     Status: None   Collection Time: 02/16/23  6:42 PM   Specimen: Anterior Nasal Swab  Result Value Ref Range Status   SARS Coronavirus 2 by RT PCR NEGATIVE NEGATIVE Final    Comment: (NOTE) SARS-CoV-2 target nucleic acids are NOT DETECTED.  The SARS-CoV-2 RNA is generally detectable in upper and lower respiratory specimens during the acute phase of infection. The lowest concentration of SARS-CoV-2 viral copies this assay can detect is 250 copies / mL. A negative result does not preclude SARS-CoV-2 infection and should not be used as the sole basis for treatment or other patient management decisions.  A negative result may occur with improper specimen collection / handling, submission of specimen other than nasopharyngeal swab, presence of viral mutation(s) within the areas targeted by this assay, and inadequate number of viral copies (<250 copies / mL). A negative result must be combined with clinical observations, patient history, and epidemiological information.  Fact Sheet for Patients:   RoadLapTop.co.za  Fact Sheet for Healthcare  Providers: http://kim-miller.com/  This test is not yet approved or  cleared by the [place] FDA and has been authorized for detection and/or diagnosis of SARS-CoV-2 by FDA under an Emergency Use Authorization (EUA).  This EUA will remain in effect (meaning this test can be used) for the duration of the COVID-19 declaration under Section 564(b)(1) of the Act, 21 U.S.C. section 360bbb-3(b)(1), unless the authorization is terminated or revoked sooner.  Performed at [facility], [address] [place], [place] [address]   Body fluid culture w Gram Stain     Status: None   Collection Time: 02/17/23  3:35 PM   Specimen: Pleural Fluid  Result Value Ref Range Status   Specimen Description FLUID PLEURAL  Final   Special Requests NONE  Final   Gram Stain NO WBC SEEN NO ORGANISMS SEEN   Final   Culture   Final    NO GROWTH 3 DAYS Performed at [facility], [address], [place], [place] [address]    Report Status 02/21/2023 FINAL  Final     Labs: BNP (last 3 results) No results for input(s): "BNP" in the last 8760 hours. Basic Metabolic Panel: Recent Labs  Lab 02/17/23 0559 02/18/23 0209 02/22/23 0251  NA 137 135 130*  K 3.6 4.1 4.0  CL 100 102 96*  CO2 GLUCOSE 153* 145* 142*  BUN 7* 9 9  CREATININE 0.70 0.72 0.57  CALCIUM 8.5* 8.1* 7.8*  MG  --   --  1.9   Liver Function Tests: Recent Labs  Lab 02/17/23 0559  AST 19  ALT 15  ALKPHOS 68  BILITOT 0.4  PROT 6.5  ALBUMIN 2.6*   No results for input(s): "LIPASE", "AMYLASE" in the last 168 hours. No results for input(s): "AMMONIA" in the last 168 hours. CBC: Recent Labs  Lab 02/17/23 0559 02/18/23 0209 02/22/23 0919  WBC 8.2 8.6 8.3  NEUTROABS 5.9  --  6.2  HGB 14.2 13.6 12.7  HCT 42.1 40.1 38.1  MCV 94.8 94.6 95.0  PLT 360 339 341   Cardiac Enzymes: No results for input(s): "CKTOTAL", "CKMB", "CKMBINDEX", "TROPONINI" in the last 168  hours. BNP: Invalid input(s): "POCBNP" CBG: No results for input(s): "GLUCAP" in the last 168 hours. D-Dimer No results for input(s): "DDIMER" in the last 72 hours. Hgb A1c No results  for input(s): "HGBA1C" in the last 72 hours. Lipid Profile No results for input(s): "CHOL", "HDL", "LDLCALC", "TRIG", "CHOLHDL", "LDLDIRECT" in the last 72 hours. Thyroid function studies No results for input(s): "TSH", "T4TOTAL", "T3FREE", "THYROIDAB" in the last 72 hours.  Invalid input(s): "FREET3" Anemia work up No results for input(s): "VITAMINB12", "FOLATE", "FERRITIN", "TIBC", "IRON", "RETICCTPCT" in the last 72 hours. Urinalysis    Component Value Date/Time   BILIRUBINUR negative 12/13/2021 1548   PROTEINUR Negative 12/13/2021 1548   UROBILINOGEN 0.2 12/13/2021 1548   NITRITE negative 12/13/2021 1548   LEUKOCYTESUR Trace (A) 12/13/2021 1548   Sepsis Labs Recent Labs  Lab 02/17/23 0559 02/18/23 0209 02/22/23 0919  WBC 8.2 8.6 8.3   Microbiology Recent Results (from the past 240 hour(s))  SARS Coronavirus 2 by RT PCR (hospital order, performed in [facility] hospital lab) *cepheid single result test* Anterior Nasal Swab     Status: None   Collection Time: 02/16/23  6:42 PM   Specimen: Anterior Nasal Swab  Result Value Ref Range Status   SARS Coronavirus 2 by RT PCR NEGATIVE NEGATIVE Final    Comment: (NOTE) SARS-CoV-2 target nucleic acids are NOT DETECTED.  The SARS-CoV-2 RNA is generally detectable in upper and lower respiratory specimens during the acute phase of infection. The lowest concentration of SARS-CoV-2 viral copies this assay can detect is 250 copies / mL. A negative result does not preclude SARS-CoV-2 infection and should not be used as the sole basis for treatment or other patient management decisions.  A negative result may occur with improper specimen collection / handling, submission of specimen other than nasopharyngeal swab, presence of viral mutation(s) within  the areas targeted by this assay, and inadequate number of viral copies (<250 copies / mL). A negative result must be combined with clinical observations, patient history, and epidemiological information.  Fact Sheet for Patients:   RoadLapTop.co.za  Fact Sheet for Healthcare Providers: http://kim-miller.com/  This test is not yet approved or  cleared by the [place] FDA and has been authorized for detection and/or diagnosis of SARS-CoV-2 by FDA under an Emergency Use Authorization (EUA).  This EUA will remain in effect (meaning this test can be used) for the duration of the COVID-19 declaration under Section 564(b)(1) of the Act, 21 U.S.C. section 360bbb-3(b)(1), unless the authorization is terminated or revoked sooner.  Performed at [facility], [address] [place], [place] [address]   Body fluid culture w Gram Stain     Status: None   Collection Time: 02/17/23  3:35 PM   Specimen: Pleural Fluid  Result Value Ref Range Status   Specimen Description FLUID PLEURAL  Final   Special Requests NONE  Final   Gram Stain NO WBC SEEN NO ORGANISMS SEEN   Final   Culture   Final    NO GROWTH 3 DAYS Performed at [facility], [address] [name], [place] [address]    Report Status 02/21/2023 FINAL  Final    Procedures/Studies: DG CHEST PORT 1 VIEW  Result Date: 02/22/2023 CLINICAL DATA:  Chest tube placement, pleural effusion EXAM: PORTABLE CHEST 1 VIEW COMPARISON:  02/20/2023 FINDINGS: Single frontal view of the chest demonstrates interval placement of a right-sided pleural drainage catheter. Decreased right pleural effusion since prior study, with minimal residual effusion obscuring the right costophrenic angle. Continued central vascular congestion. Improved aeration at the lung bases, with residual streaky opacities within the bilateral lower lobes. No pneumothorax. No acute bony abnormalities. IMPRESSION: 1.  Decreased right pleural effusion after right pleural drainage catheter placement. 2. Continued vascular congestion, with improved aeration at the lung bases. Electronically Signed   By: Sharlet Salina M.D.   On: 02/22/2023 15:20   DG CHEST PORT 1 VIEW  Result Date: 02/20/2023 CLINICAL DATA:  Pleural effusion EXAM: PORTABLE CHEST 1 VIEW COMPARISON:  Portable exam 1211 hours compared to 02/18/2023 FINDINGS: Normal heart size and pulmonary vascularity. Atherosclerotic calcification aorta. Moderate-sized hiatal hernia. Moderate RIGHT pleural effusion and basilar atelectasis. Coexistent infiltrate RIGHT lung and LEFT lower lobe atelectasis. No pneumothorax or acute osseous findings. Diffuse osseous demineralization. IMPRESSION: Moderate LEFT pleural effusion and basilar atelectasis. Subsegmental atelectasis LEFT lower lobe with additional RIGHT lung infiltrate which could represent asymmetric edema or infection. Electronically Signed   By: Ulyses Southward M.D.   On: 02/20/2023 13:14   DG CHEST PORT 1 VIEW  Result Date: 02/18/2023 CLINICAL DATA:  Post thoracentesis EXAM: PORTABLE CHEST 1 VIEW COMPARISON:  02/17/2023 FINDINGS: Stable heart size. Small hiatal hernia. Aortic atherosclerosis. Moderate right pleural effusion, decreased from prior. Persistent but improving airspace opacities throughout the right lung. No pneumothorax. Left lung remains clear aside from minor left basilar atelectasis. IMPRESSION: 1. Moderate right pleural effusion, decreased following thoracentesis. No pneumothorax. 2. Persistent but improving airspace opacities throughout the right lung. Electronically Signed   By: Duanne Guess D.O.   On: 02/18/2023 13:55   DG CHEST PORT 1 VIEW  Result Date: 02/17/2023 CLINICAL DATA:  Status post thoracentesis. EXAM: PORTABLE CHEST 1 VIEW COMPARISON:  Radiograph and chest CT 02/16/2023 FINDINGS: Decreased size of the large right pleural effusion since yesterday after thoracentesis. Improved aeration  of the right upper lobe. Left basilar atelectasis/scarring. Similar leftward mediastinal shift. Stable cardiomediastinal silhouette. Aortic atherosclerotic calcification. No pneumothorax. IMPRESSION: Large left pleural effusion, slightly decreased from 02/16/2023. Improved aeration of the right upper lobe. No pneumothorax. Electronically Signed   By: Minerva Fester M.D.   On: 02/17/2023 16:30   CT Angio Chest PE W and/or Wo Contrast  Result Date: 02/16/2023 CLINICAL DATA:  Shortness of breath for 3 weeks. EXAM: CT ANGIOGRAPHY CHEST WITH CONTRAST TECHNIQUE: Multidetector CT imaging of the chest was performed using the standard protocol during bolus administration of intravenous contrast. Multiplanar CT image reconstructions and MIPs were obtained to evaluate the vascular anatomy. RADIATION DOSE REDUCTION: This exam was performed according to the departmental dose-optimization program which includes automated exposure control, adjustment of the mA and/or kV according to patient size and/or use of iterative reconstruction technique. CONTRAST:  75mL OMNIPAQUE IOHEXOL 350 MG/ML SOLN COMPARISON:  Chest x-ray 02/16/2023 earlier. CT abdomen pelvis 01/13/2022 FINDINGS: Cardiovascular: The heart is nonenlarged. Trace pericardial fluid. Coronary artery calcifications are seen. The thoracic aorta has some mild atherosclerotic plaque. There is significant breathing motion identified. This limits evaluation of small and peripheral emboli, nondiagnostic for such small foci. No segmental or larger pulmonary embolism identified. Mediastinum/Nodes: Severe shift of the mediastinum from right to left. Mildly patulous esophagus. Moderate hiatal hernia. No abnormal lymph node enlargement identified in the axillary regions, left hilum. There are however areas of presumed abnormal nodes in the mediastinum such as series 7, image 179 measuring 2.2 by 1.7 cm. This also could be a soft tissue mass. Additional nodule seen posterior to the  sternum inferiorly. Additional other abnormal anterior cardiophrenic nodes. Lungs/Pleura: Complete opacification of the right hemithorax with collapse of the right lung. Very large right-sided pleural effusion with areas of pleural thickening and nodularity. Example posteriorly on series 5, image  85 measuring up to 9 mm in thickness. There is suggestion of the central mass the right lung hilum with the occlusion of bronchi. This areas difficult to define in detail with the level of shift in mass effect. Tissues in this location are distorted. The left lung has several small lung nodules. Example lingula series 5, image 45 measuring 5 mm, left lower lobe series 5, image 68 measuring 13 mm. Several others. Upper Abdomen: Slight nodular contours of the liver. Musculoskeletal: Curvature and degenerative changes along the spine. There are mixed lucent and sclerotic bone lesions identified in the spine for example at T11, T12, L1-L2. Bone metastases are possible. Left-sided mastectomy with axillary surgical clips. Review of the MIP images confirms the above findings. IMPRESSION: Findings worrisome for malignancy. Nodular areas of pleural thickening along the right hemithorax with a very large right pleural effusion causing mass effect with severe shift of the mediastinum from right to left and collapse of the right lung. Distortion of the tissues in the right hilum, possible mass, but incompletely defined on this examination. Several left-sided lung nodules, mediastinal lymph nodes. Mixed lucent sclerotic bone lesions identified along the spine at the thoracolumbar junction. Possible metastatic disease of the bone. Additional workup with whole-body bone scan as clinically appropriate. Overall, there may be benefit to a follow-up CT scan with contrast after thoracentesis to decrease the lung distortion and improved visualization of the hilum and mediastinum. Significant breathing motion. No segmental or larger pulmonary  embolism. Aortic Atherosclerosis (ICD10-I70.0). Electronically Signed   By: Karen Kays M.D.   On: 02/16/2023 17:29   DG Chest 2 View  Result Date: 02/16/2023 CLINICAL DATA:  SOB EXAM: CHEST - 2 VIEW COMPARISON:  08/30/2021 FINDINGS: Right hemithorax completely opacified. No pneumothorax identified. Vascular congestion identified on the left. There is calcification of the aorta. Small hiatal hernia. IMPRESSION: Right hemithorax completely opacified, a new finding. Electronically Signed   By: Layla Maw M.D.   On: 02/16/2023 16:19   DG Abd 2 Views  Result Date: 02/03/2023 CLINICAL DATA:  Abdominal pain. EXAM: ABDOMEN - 2 VIEW COMPARISON:  None Available. FINDINGS: The bowel gas pattern is normal without gaseous distention. No radio-opaque calculi or other significant radiographic abnormality are seen. Increased stool noted consistent with constipation. IMPRESSION: Constipation. Electronically Signed   By: Layla Maw M.D.   On: 02/03/2023 08:43     Time coordinating discharge: Over 30 minutes    Lewie Chamber, MD  [facility] 02/23/2023, 6:19 PM

## 2023-02-23 NOTE — Progress Notes (Signed)
   NAME:  Gina Hunt, MRN:  [id number], DOB:  July 20, 1946, LOS: 6 ADMISSION DATE:  02/16/2023, CONSULTATION DATE:  02/23/2023  REFERRING MD:  Myrene Buddy, CHIEF COMPLAINT: Shortness of breath  History of Present Illness:  77 year old never smoker presents with a history of shortness of breath ongoing for 3 weeks.  She reports this has been progressive and she can barely walk a few yards now.  She reports dullness and heaviness in her chest not related to deep inspiration. She was found to be hypoxic to 77% on room air in her PCP office and sent over to the ED, required 4 L of oxygen. Chest x-ray showed complete whiteout on the right suggesting large right pleural effusion with mediastinal shift to left. CT angiogram chest showed nodular pleural thickening on the right with large effusion, shift of mediastinum from right to left and collapsed right lung, several left-sided lung nodules and mediastinal lymph nodes.  Multiple sclerotic bone lesions in the spine   Pertinent  Medical History  Left breast cancer treated 2 years ago with mastectomy and chemotherapy  Significant Hospital Events: Including procedures, antibiotic start and stop dates in addition to other pertinent events   4/19 RT thoracentesis>> 1.6 L 4/20 RT thoracentesis>> 1.2 L 4/22 Am CXR Stable, patient weaned to RA this am. Cytology for sample 4/19 and 4/20 remains pending  4/23 pleural fluid cytology with metastatic adenocarcinoma of the breast 4/24 right pleurX catheter placed  Interim History / Subjective:   Her breathing is feeling better today She is sore at procedure site  Objective   Blood pressure (!) 102/57, pulse 81, temperature 98.5 F (36.9 C), resp. rate 18, height 5' (1.524 m), weight 52.2 kg, SpO2 95 %.       No intake or output data in the 24 hours ending 02/23/23 1328  Filed Weights   02/16/23 1525 02/22/23 1313  Weight: 52.2 kg 52.2 kg    Examination: Elderly female no acute distress, resting  in bed Decreased breath sounds on right base, crackles Heart sounds are regular Abdomen soft nontender Lower extremities warm and dry  Resolved Hospital Problem list     Assessment & Plan:  Malignant Right Pleural Effusion, metastatic breast adenocarcinoma - s/p thoracentesis x 2 - both pleural fluid samples positive for metastatic breast adenocarcinoma - pleurX placed 4/24 - Will get pleurX teaching today - safe for discharge from our standpoint - Will arrange follow up in clinic next week for suture removal  -Acute respiratory failure with hypoxia P: - in setting of large right malignant effusion - Mobilize as needed - Pulmonary hygiene  Best Practice (right click and "Reselect all SmartList Selections" daily)  Per primary    Melody Comas, MD [facility] Office: [phone]   See Amion for personal pager PCCM on call pager [phone] until 7pm. Please call Elink 7p-7a. [phone]

## 2023-02-23 NOTE — Care Management Important Message (Signed)
Important Message  Patient Details  Name: Gina Hunt MRN: [id number] Date of Birth: [birth year]   Medicare Important Message Given:  Yes     Sherilyn Banker 02/23/2023, 12:02 PM

## 2023-02-23 NOTE — Telephone Encounter (Signed)
Left patient a vm regarding upcoming appointment  

## 2023-02-23 NOTE — Telephone Encounter (Signed)
Left patient a vm regarding both upcoming appointments

## 2023-02-23 NOTE — TOC Transition Note (Signed)
Transition of Care [facility]) - CM/SW Discharge Note   Patient Details  Name: Gina Hunt MRN: [id number] Date of Birth: 03/08/1946  Transition of Care [facility]) CM/SW Contact:  Tom-Johnson, Hershal Coria, RN Phone Number: 02/23/2023, 1:17 PM   Clinical Narrative:     Patient is scheduled for discharge today.  Readmission Prevention Assessment done. Home health info, hospital f/u and discharge instructions on AVS.   Family to transport at discharge.  No further TOC needs noted.     Final next level of care: Home w Home Health Services Barriers to Discharge: Barriers Resolved   Patient Goals and CMS Choice CMS Medicare.gov Compare Post Acute Care list provided to:: Patient Choice offered to / list presented to : Patient  Discharge Placement                  Patient to be transferred to facility by: Niece Name of family member notified: Randye Lobo    Discharge Plan and Services Additional resources added to the After Visit Summary for     Discharge Planning Services: CM Consult Post Acute Care Choice: Home Health          DME Arranged: N/A         HH Arranged: RN, PT, OT HH Agency: [facility] Date [facility] Agency Contacted: 02/22/23 Time HH Agency Contacted: [phone] Representative spoke with at [facility] Agency: Kandee Keen  Social Determinants of Health (SDOH) Interventions SDOH Screenings   Food Insecurity: No Food Insecurity (02/17/2023)  Housing: Low Risk  (02/17/2023)  Transportation Needs: No Transportation Needs (02/17/2023)  Utilities: At Risk (02/17/2023)  Alcohol Screen: Low Risk  (01/30/2023)  Depression (PHQ2-9): High Risk (01/30/2023)  Financial Resource Strain: Medium Risk (01/30/2023)  Physical Activity: Sufficiently Active (01/28/2022)  Social Connections: Moderately Integrated (01/30/2023)  Stress: Stress Concern Present (01/30/2023)  Tobacco Use: Low Risk  (02/22/2023)     Readmission Risk Interventions    02/23/2023    1:16 PM  Readmission Risk Prevention  Plan  Transportation Screening Complete  PCP or Specialist Appt within 5-7 Days Complete  Home Care Screening Complete  Medication Review (RN CM) Referral to Pharmacy

## 2023-02-23 NOTE — Progress Notes (Addendum)
Pt needs education on how to care for and drain her pleurX drain. Per CM note from 02/22/23 pt has 2 RN friends that are going to help drain and care for it. These ladies are here now and available for the teaching before pt goes home today.   This RN has reached out to other units that work with this type of drain frequently. RN from [facility] is coming up to teach pt and friends how to care for and empty the drain.  Annice Needy, RN SWOT

## 2023-02-23 NOTE — Progress Notes (Signed)
Mobility Specialist Progress Note   02/23/23 1125  Mobility  Activity Ambulated with assistance in hallway  Level of Assistance Standby assist, set-up cues, supervision of patient - no hands on  Assistive Device Front wheel walker  Distance Ambulated (ft) 120 ft  Range of Motion/Exercises Active;All extremities  Activity Response Tolerated well   Patient received in recliner and agreeable to participate. Reported feeling and breathing better, only complaint is discomfort/pain at surgical site and with coughing. Ambulated supervision level with slow steady gait. Required standing rest break x1. Unable to obtain accurate oxygen saturation reading. Returned to room without complaint or incident. Was left dangling EOB with OT present.  Gina Hunt, BS EXP Mobility Specialist Please contact via SecureChat or Rehab office at [phone]

## 2023-02-23 NOTE — Progress Notes (Signed)
Occupational Therapy Treatment Patient Details Name: Gina Hunt MRN: [id number] DOB: [birth year] Today's Date: 02/23/2023   History of present illness 77 y.o. female presented 4/18 with SOB, hypoxia. s/p thoracentesis 4/19, 4/20. with medical history significant of breast cancer, HTN, HLD, and hypothyroidism.   OT comments  Pt making progress with functional goals. Pt just finished walking with mobility tech upon OT arrival. Unable to obtain accurate O2 SAT reading due to cold temp of fingers. Pt required rest break seated on toilet before getting up to walk back to EOB. Pt educated on eneergy conservation techniques with handout provided. OT will continue to follow acutely to maximize level of function and safety   Recommendations for follow up therapy are one component of a multi-disciplinary discharge planning process, led by the attending physician.  Recommendations may be updated based on patient status, additional functional criteria and insurance authorization.    Assistance Recommended at Discharge    Patient can return home with the following  Assistance with cooking/housework;Assist for transportation;Help with stairs or ramp for entrance   Equipment Recommendations  None recommended by OT    Recommendations for Other Services      Precautions / Restrictions Precautions Precautions: Fall Precaution Comments: monitor O2 Restrictions Weight Bearing Restrictions: No       Mobility Bed Mobility               General bed mobility comments: pt sititng EOB upon arrival (just walked with mobility tech)    Transfers Overall transfer level: Needs assistance Equipment used: Rolling walker (2 wheels) Transfers: Sit to/from Stand Sit to Stand: Supervision                 Balance Overall balance assessment: Needs assistance Sitting-balance support: No upper extremity supported, Feet supported Sitting balance-Leahy Scale: Good     Standing balance support:  No upper extremity supported Standing balance-Leahy Scale: Fair                             ADL either performed or assessed with clinical judgement   ADL Overall ADL's : Needs assistance/impaired     Grooming: Wash/dry hands;Wash/dry face;Standing       Lower Body Bathing: Supervison/ safety;Sitting/lateral leans Lower Body Bathing Details (indicate cue type and reason): simulated seated EOB (pt states that she has been using shower seat at home recently)     Lower Body Dressing: Min guard;Sitting/lateral leans;Sit to/from stand   Toilet Transfer: Supervision/safety;Ambulation;Rolling walker (2 wheels);Regular Toilet;Grab bars   Toileting- Clothing Manipulation and Hygiene: Supervision/safety       Functional mobility during ADLs: Supervision/safety;Rolling walker (2 wheels) General ADL Comments: pt required rest break seated on toilet before getting up to walk back to EOB. Pt educated on eneergy conservation techniques with handout provided    Extremity/Trunk Assessment Upper Extremity Assessment Upper Extremity Assessment: Generalized weakness   Lower Extremity Assessment Lower Extremity Assessment: Defer to PT evaluation   Cervical / Trunk Assessment Cervical / Trunk Assessment: Normal    Vision Ability to See in Adequate Light: 0 Adequate Patient Visual Report: No change from baseline     Perception     Praxis      Cognition Arousal/Alertness: Awake/alert Behavior During Therapy: WFL for tasks assessed/performed Overall Cognitive Status: Within Functional Limits for tasks assessed  Exercises      Shoulder Instructions       General Comments      Pertinent Vitals/ Pain       Pain Assessment Pain Assessment: No/denies pain Faces Pain Scale: No hurt  Home Living Family/patient expects to be discharged to:: Private residence Living Arrangements: Alone                                       Prior Functioning/Environment              Frequency  Min 2X/week        Progress Toward Goals  OT Goals(current goals can now be found in the care plan section)  Progress towards OT goals: Progressing toward goals     Plan Discharge plan remains appropriate    Co-evaluation                 AM-PAC OT "6 Clicks" Daily Activity     Outcome Measure   Help from another person eating meals?: None Help from another person taking care of personal grooming?: A Little Help from another person toileting, which includes using toliet, bedpan, or urinal?: A Little Help from another person bathing (including washing, rinsing, drying)?: A Little Help from another person to put on and taking off regular upper body clothing?: None Help from another person to put on and taking off regular lower body clothing?: A Little 6 Click Score: 20    End of Session Equipment Utilized During Treatment: Rolling walker (2 wheels)  OT Visit Diagnosis: Unsteadiness on feet (R26.81);Other abnormalities of gait and mobility (R26.89)   Activity Tolerance Patient limited by fatigue   Patient Left with call bell/phone within reach;in bed;with family/visitor present;Other (comment) (sitting EOB)   Nurse Communication          Time: 1610-9604 OT Time Calculation (min): 18 min  Charges: OT General Charges $OT Visit: 1 Visit OT Treatments $Therapeutic Activity: 8-22 mins    Galen Manila 02/23/2023, 1:19 PM

## 2023-02-23 NOTE — Progress Notes (Addendum)
pleurX drain teaching was done by RN Arlys John from [facility].  Pt and friend was shown how with demonstration of connecting and removing the fluid. 900cc of cherry red tea color was removed, pt did have some coughing and chest discomfort as fluid was removed, which was to be expected. Removal rate was reduced at times for pt comfort.  Pt tolerated, pt friend comfortable learning how and confident she can assist pt with.  Drain supplies being sent home with pt, AVS d/c instructions on how to care for and drain the catheter also included. Pt feeling well and comfortable at time of discharge.   Discharge instructions reviewed with pt, and her friends.  Copy of instructions given to pt. Pt informed her script was sent to her pharmacy for pick up. Questions answered.  Pt d/c'd via wheelchair with belongings, with friends.            Escorted by staff.    Annice Needy, RN SWOT

## 2023-02-23 NOTE — Progress Notes (Signed)
PT Cancellation Note  Patient Details Name: Gina Hunt MRN: [id number] DOB: 1946-09-25   Cancelled Treatment:    Reason Eval/Treat Not Completed: Fatigue/lethargy limiting ability to participate  Pt politely declines PT services today. Has been up and mobile a couple of times recently with OT and mobility tech. Denies significant change to respiratory symptoms. Will follow.  Kathlyn [place], PT, DPT Physical Therapist [facility]  [phone]  02/23/2023, 2:58 PM

## 2023-02-24 ENCOUNTER — Telehealth: Payer: Self-pay

## 2023-02-24 ENCOUNTER — Telehealth: Payer: Self-pay | Admitting: Family Medicine

## 2023-02-24 DIAGNOSIS — J9601 Acute respiratory failure with hypoxia: Secondary | ICD-10-CM | POA: Diagnosis not present

## 2023-02-24 DIAGNOSIS — J942 Hemothorax: Secondary | ICD-10-CM | POA: Diagnosis not present

## 2023-02-24 DIAGNOSIS — Z48813 Encounter for surgical aftercare following surgery on the respiratory system: Secondary | ICD-10-CM | POA: Diagnosis not present

## 2023-02-24 DIAGNOSIS — Z4803 Encounter for change or removal of drains: Secondary | ICD-10-CM | POA: Diagnosis not present

## 2023-02-24 DIAGNOSIS — C799 Secondary malignant neoplasm of unspecified site: Secondary | ICD-10-CM | POA: Diagnosis not present

## 2023-02-24 DIAGNOSIS — C50911 Malignant neoplasm of unspecified site of right female breast: Secondary | ICD-10-CM | POA: Diagnosis not present

## 2023-02-24 DIAGNOSIS — J90 Pleural effusion, not elsewhere classified: Secondary | ICD-10-CM | POA: Diagnosis not present

## 2023-02-24 DIAGNOSIS — E1165 Type 2 diabetes mellitus with hyperglycemia: Secondary | ICD-10-CM | POA: Diagnosis not present

## 2023-02-24 DIAGNOSIS — Z4801 Encounter for change or removal of surgical wound dressing: Secondary | ICD-10-CM | POA: Diagnosis not present

## 2023-02-24 NOTE — Transitions of Care (Post Inpatient/ED Visit) (Signed)
   02/24/2023  Name: Gina Hunt MRN: [id number] DOB: 04-Nov-1945  Today's TOC FU Call Status: Today's TOC FU Call Status:: Unsuccessul Call (1st Attempt) Unsuccessful Call (1st Attempt) Date: 02/24/23  Attempted to reach the patient regarding the most recent Inpatient/ED visit.  Follow Up Plan: Additional outreach attempts will be made to reach the patient to complete the Transitions of Care (Post Inpatient/ED visit) call.      Antionette Fairy, RN,BSN,CCM [facility] Care Management Care Management Community Coordinator Direct Phone: [phone] Toll Free: [phone] Fax: [phone]

## 2023-02-24 NOTE — Telephone Encounter (Signed)
Called informed ok per PCP. The RN wanted PCP to be aware the patient had very low depression score.  There is a total of 27 and she scored 14. She is very depressed daily, fells bad about herself and is not eating.

## 2023-02-24 NOTE — Telephone Encounter (Signed)
Caller/AgencyFrances Furbish [facility] Callback Number: [phone] Requesting OT/PT/Skilled Nursing/Social Work/Speech Therapy: PT - nursing Frequency: 1 x 7

## 2023-02-26 ENCOUNTER — Encounter (HOSPITAL_COMMUNITY): Payer: Self-pay | Admitting: Pulmonary Disease

## 2023-02-27 ENCOUNTER — Telehealth: Payer: Self-pay

## 2023-02-27 NOTE — Transitions of Care (Post Inpatient/ED Visit) (Signed)
   02/27/2023  Name: Gina Hunt MRN: [id number] DOB: [birth year]  Today's TOC FU Call Status: Today's TOC FU Call Status:: Unsuccessful Call (2nd Attempt) Unsuccessful Call (2nd Attempt) Date: 02/27/23  Attempted to reach the patient regarding the most recent Inpatient/ED visit.  Follow Up Plan: Additional outreach attempts will be made to reach the patient to complete the Transitions of Care (Post Inpatient/ED visit) call.     Antionette Fairy, RN,BSN,CCM [facility] Care Management Care Management Community Coordinator Direct Phone: [phone] Toll Free: [phone] Fax: [phone]

## 2023-02-27 NOTE — Transitions of Care (Post Inpatient/ED Visit) (Signed)
   02/27/2023  Name: Gina Hunt MRN: [id number] DOB: 02-19-1946  Today's TOC FU Call Status: Today's TOC FU Call Status:: Unsuccessful Call (3rd Attempt) Unsuccessful Call (3rd Attempt) Date: 02/27/23  Attempted to reach the patient regarding the most recent Inpatient/ED visit.  Follow Up Plan: No further outreach attempts will be made at this time. We have been unable to contact the patient.    Antionette Fairy, RN,BSN,CCM [facility] Care Management Care Management Community Coordinator Direct Phone: [phone] Toll Free: [phone] Fax: [phone]

## 2023-02-28 ENCOUNTER — Encounter: Payer: Self-pay | Admitting: Adult Health

## 2023-02-28 ENCOUNTER — Inpatient Hospital Stay: Payer: Medicare HMO | Attending: Adult Health | Admitting: Adult Health

## 2023-02-28 ENCOUNTER — Inpatient Hospital Stay: Payer: Medicare HMO

## 2023-02-28 ENCOUNTER — Other Ambulatory Visit: Payer: Self-pay

## 2023-02-28 VITALS — BP 125/71 | HR 85 | Temp 97.7°F | Resp 16 | Wt 105.6 lb

## 2023-02-28 DIAGNOSIS — K469 Unspecified abdominal hernia without obstruction or gangrene: Secondary | ICD-10-CM | POA: Diagnosis not present

## 2023-02-28 DIAGNOSIS — Z48813 Encounter for surgical aftercare following surgery on the respiratory system: Secondary | ICD-10-CM | POA: Diagnosis not present

## 2023-02-28 DIAGNOSIS — C799 Secondary malignant neoplasm of unspecified site: Secondary | ICD-10-CM | POA: Diagnosis not present

## 2023-02-28 DIAGNOSIS — J910 Malignant pleural effusion: Secondary | ICD-10-CM | POA: Insufficient documentation

## 2023-02-28 DIAGNOSIS — C50911 Malignant neoplasm of unspecified site of right female breast: Secondary | ICD-10-CM

## 2023-02-28 DIAGNOSIS — C50412 Malignant neoplasm of upper-outer quadrant of left female breast: Secondary | ICD-10-CM | POA: Diagnosis not present

## 2023-02-28 DIAGNOSIS — J90 Pleural effusion, not elsewhere classified: Secondary | ICD-10-CM | POA: Diagnosis not present

## 2023-02-28 DIAGNOSIS — Z800 Family history of malignant neoplasm of digestive organs: Secondary | ICD-10-CM | POA: Insufficient documentation

## 2023-02-28 DIAGNOSIS — K5900 Constipation, unspecified: Secondary | ICD-10-CM | POA: Diagnosis not present

## 2023-02-28 DIAGNOSIS — G893 Neoplasm related pain (acute) (chronic): Secondary | ICD-10-CM

## 2023-02-28 DIAGNOSIS — Z9012 Acquired absence of left breast and nipple: Secondary | ICD-10-CM | POA: Diagnosis not present

## 2023-02-28 DIAGNOSIS — Z4803 Encounter for change or removal of drains: Secondary | ICD-10-CM | POA: Diagnosis not present

## 2023-02-28 DIAGNOSIS — Z9221 Personal history of antineoplastic chemotherapy: Secondary | ICD-10-CM | POA: Insufficient documentation

## 2023-02-28 DIAGNOSIS — E1165 Type 2 diabetes mellitus with hyperglycemia: Secondary | ICD-10-CM | POA: Diagnosis not present

## 2023-02-28 DIAGNOSIS — Z170 Estrogen receptor positive status [ER+]: Secondary | ICD-10-CM | POA: Insufficient documentation

## 2023-02-28 DIAGNOSIS — Z803 Family history of malignant neoplasm of breast: Secondary | ICD-10-CM | POA: Insufficient documentation

## 2023-02-28 DIAGNOSIS — J942 Hemothorax: Secondary | ICD-10-CM | POA: Diagnosis not present

## 2023-02-28 DIAGNOSIS — J9601 Acute respiratory failure with hypoxia: Secondary | ICD-10-CM | POA: Diagnosis not present

## 2023-02-28 DIAGNOSIS — Z4801 Encounter for change or removal of surgical wound dressing: Secondary | ICD-10-CM | POA: Diagnosis not present

## 2023-02-28 LAB — CMP (CANCER CENTER ONLY)
ALT: 14 U/L (ref 0–44)
AST: 23 U/L (ref 15–41)
Albumin: 3.2 g/dL — ABNORMAL LOW (ref 3.5–5.0)
Alkaline Phosphatase: 84 U/L (ref 38–126)
Anion gap: 6 (ref 5–15)
BUN: 11 mg/dL (ref 8–23)
CO2: 31 mmol/L (ref 22–32)
Calcium: 9.1 mg/dL (ref 8.9–10.3)
Chloride: 100 mmol/L (ref 98–111)
Creatinine: 0.67 mg/dL (ref 0.44–1.00)
GFR, Estimated: >60 mL/min (ref >60)
Glucose, Bld: 127 mg/dL — ABNORMAL HIGH (ref 70–99)
Potassium: 5.2 mmol/L — ABNORMAL HIGH (ref 3.5–5.1)
Sodium: 137 mmol/L (ref 135–145)
Total Bilirubin: 0.4 mg/dL (ref 0.3–1.2)
Total Protein: 6.8 g/dL (ref 6.5–8.1)

## 2023-02-28 LAB — CBC WITH DIFFERENTIAL (CANCER CENTER ONLY)
Abs Immature Granulocytes: 0.05 10*3/uL (ref 0.00–0.07)
Basophils Absolute: 0.1 10*3/uL (ref 0.0–0.1)
Basophils Relative: 1 %
Eosinophils Absolute: 0.2 10*3/uL (ref 0.0–0.5)
Eosinophils Relative: 3 %
HCT: 40.7 % (ref 36.0–46.0)
Hemoglobin: 13.8 g/dL (ref 12.0–15.0)
Immature Granulocytes: 1 %
Lymphocytes Relative: 17 %
Lymphs Abs: 1.3 10*3/uL (ref 0.7–4.0)
MCH: 31.9 pg (ref 26.0–34.0)
MCHC: 33.9 g/dL (ref 30.0–36.0)
MCV: 94 fL (ref 80.0–100.0)
Monocytes Absolute: 0.8 10*3/uL (ref 0.1–1.0)
Monocytes Relative: 10 %
Neutro Abs: 5.6 10*3/uL (ref 1.7–7.7)
Neutrophils Relative %: 68 %
Platelet Count: 580 10*3/uL — ABNORMAL HIGH (ref 150–400)
RBC: 4.33 MIL/uL (ref 3.87–5.11)
RDW: 12.6 % (ref 11.5–15.5)
WBC Count: 8.1 10*3/uL (ref 4.0–10.5)
nRBC: 0 % (ref 0.0–0.2)

## 2023-02-28 MED ORDER — OXYCODONE HCL 5 MG PO TABS
5.0000 mg | ORAL_TABLET | Freq: Four times a day (QID) | ORAL | 0 refills | Status: DC | PRN
Start: 2023-02-28 — End: 2023-04-18

## 2023-02-28 NOTE — Patient Instructions (Signed)

## 2023-02-28 NOTE — Progress Notes (Signed)
[facility] Cancer Follow up:    Gina Dory, DO [address] [place] [address]   DIAGNOSIS:  Cancer Staging  Malignant neoplasm of upper-outer quadrant of left breast in female, estrogen receptor positive (HCC) Staging form: Breast, AJCC 8th Edition - Clinical stage from 12/16/2020: Stage IIA (cT2, cN0, cM0, G3, ER+, PR+, HER2-) - Signed by Lowella Dell, MD on 12/16/2020 Stage prefix: Initial diagnosis   SUMMARY OF ONCOLOGIC HISTORY: Gina Hunt, [place] woman status post left breast upper outer quadrant biopsy 12/08/2020 for a clinical T2N0, stage IIA invasive ductal carcinoma, grade 3, estrogen and progesterone receptor positive, HER-2 not amplified, with an MIB-1 of 30%   (1) Oncotype obtained from the original biopsy shows a score of 32, predicting a risk of recurrence outside the breast in the next 9 years of 20% if node-negative, 25% if node positive, if antiestrogens are the only systemic treatment, also predicting a greater than 15% benefit from chemotherapy   (2) genetics testing 01/01/2021 through the [facility] CustomNext-Cancer +RNAinsight Panel found no deleterious mutations in APC, ATM, AXIN2, BARD1, BMPR1A, BRCA1, BRCA2, BRIP1, CDH1, CDK4, CDKN2A, CHEK2, DICER1, EPCAM, GREM1, HOXB13, MEN1, MLH1, MSH2, MSH3, MSH6, MUTYH, NBN, NF1, NF2, NTHL1, PALB2, PMS2, POLD1, POLE, PTEN, RAD51C, RAD51D, RECQL, RET, SDHA, SDHAF2, SDHB, SDHC, SDHD, SMAD4, SMARCA4, STK11, TP53, TSC1, TSC2, and VHL.  RNA data is routinely analyzed for use in variant interpretation for all genes.   (3) anastrozole started neoadjuvantly 12/16/2020, discontinued 03/2021 with multiple side effects   (4) status post left mastectomy and sentinel lymph node sampling 04/07/2021 for a pT2 pN0, stage IIA invasive ductal carcinoma, grade 2, with negative margins             (A) a single left axilla lymph node removed             (B) repeat prognostic panel finds the cancer estrogen  receptor positive, progesterone receptor and HER2 negative             (C) palpable lesion in the left breast consistent with evolving 0.5 oil cyst/fat necrosis by ultrasonography at [facility] on 07/20/2021   (5) adjuvant chemotherapy with cyclophosphamide, methotrexate and fluorouracil (CMF) started 05/17/2021, repeated every 21 days times 8, last dose 10/11/2021   (6) started tamoxifen 10/31/2021 discontinued 02/28/2023  METASTATIC DISEASE: Hospitalized for one week beginning 4/18 with malignant pleural effusion, thoracentesis noted metastatic breast cancer, ER+, PR-, HER-2 -.  CT chest abdomen pelvis scheduled for 03/22/2023,   CURRENT THERAPY: to start Faslodex  INTERVAL HISTORY: Gina Hunt 77 y.o. female returns for f/u after hospitalization from 02/16/2023 through 02/23/2023 that demonstrated right pleural effusion, + metastatic breast cancer, ER+, PR-, HER-2 - (0). She had pleurx placement during her hospitalization.  Since her discharge she has noted some lower abdominal pain and lower back pain in her lumbar/sacral area.  She has generalized weakness and is working with physical therapy to regain her strength.  She denies any bowel or bladder incontinence or saddle anesthesia.  Her last bowel movement was prior to her hospitalization and she has taken many stool softeners and laxatives.  She is passing gas and is set to have a fleets enema this afternoon.  She is taking oxycodone 5 mg every 4 hours as needed for pain and this is helping keep her pain at bay.  The pain is mainly in her lower abdomen from her hernia issues as well as in her lower back.  Patient Active Problem List   Diagnosis Date Noted   Breast carcinoma metastatic to multiple sites, right (HCC) 02/18/2023   Pleural effusion on right 02/18/2023   Pleural effusion 02/17/2023   History of breast cancer 02/17/2023   Malnutrition of moderate degree (HCC) 02/17/2023   Acute respiratory failure with hypoxia (HCC)  02/16/2023   Seasonal affective disorder (HCC) 10/14/2022   Essential hypertension 10/14/2022   Type 2 diabetes mellitus with hyperglycemia, without long-term current use of insulin (HCC) 10/14/2022   Dysphagia    Gastroesophageal reflux disease    Genetic testing 12/29/2020   Family history of breast cancer 12/16/2020   Family history of gastric cancer 12/16/2020   Malignant neoplasm of upper-outer quadrant of left breast in female, estrogen receptor positive (HCC) 12/14/2020   DNR (do not resuscitate) 12/20/2019   Hyperlipidemia 11/12/2018   Stress incontinence 10/12/2018   Other fatigue 05/19/2017   Dyslipidemia 03/08/2017   History of peptic ulcer 03/06/2017   History of hypothyroidism 03/06/2017   Fibromyalgia 09/06/2016   Primary osteoarthritis of both knees 09/06/2016   Primary osteoarthritis of both hands 09/06/2016   DJD (degenerative joint disease), cervical 09/06/2016   Peptic ulcer disease 09/06/2016   Hypothyroidism 09/06/2016    is allergic to codeine and morphine and related.  MEDICAL HISTORY: Past Medical History:  Diagnosis Date   Anxiety    Breast cancer (HCC) 01/2021   left breast IDC   Complication of anesthesia    Family history of breast cancer 12/16/2020   Family history of gastric cancer 12/16/2020   Fibromyalgia    GERD (gastroesophageal reflux disease)    with hiatal hernia   Hyperlipidemia    Hypothyroidism    Osteoarthritis    knees, hands, fingers   PONV (postoperative nausea and vomiting)    Port-A-Cath in place 05/26/2021   Skin cancer     SURGICAL HISTORY: Past Surgical History:  Procedure Laterality Date   ABDOMINAL HYSTERECTOMY     CHEST TUBE INSERTION N/A 02/22/2023   Procedure: INSERTION PLEURAL DRAINAGE CATHETER;  Surgeon: Martina Sinner, MD;  Location: [facility] ENDOSCOPY;  Service: Pulmonary;  Laterality: N/A;   COLONOSCOPY  11/24/2015   ESOPHAGEAL MANOMETRY N/A 07/20/2022   Procedure: ESOPHAGEAL MANOMETRY (EM);  Surgeon:  Shellia Cleverly, DO;  Location: [facility] ENDOSCOPY;  Service: Gastroenterology;  Laterality: N/A;   EVACUATION BREAST HEMATOMA Left 04/07/2021   Procedure: EVACUATION HEMATOMA BREAST;  Surgeon: Emelia Loron, MD;  Location: [facility];  Service: General;  Laterality: Left;   LIVER SURGERY     Removed appendix and gall bladder   PORTA CATH INSERTION Right 04/07/2021   Procedure: PORTA CATH INSERTION;  Surgeon: Emelia Loron, MD;  Location: [facility];  Service: General;  Laterality: Right;   SIMPLE MASTECTOMY WITH AXILLARY SENTINEL NODE BIOPSY Left 04/07/2021   Procedure: LEFT SIMPLE MASTECTOMY WITH LEFT AXILLARY SENTINEL NODE BIOPSY;  Surgeon: Emelia Loron, MD;  Location: [facility];  Service: General;  Laterality: Left;   TONSILLECTOMY     TRIGGER FINGER RELEASE Right 03/03/2022    SOCIAL HISTORY: Social History   Socioeconomic History   Marital status: Widowed    Spouse name: Not on file   Number of children: Not on file   Years of education: Not on file   Highest education level: Not on file  Occupational History   Occupation: retired  Tobacco Use   Smoking status: Never   Smokeless tobacco: Never  Vaping Use   Vaping Use:  Never used  Substance and Sexual Activity   Alcohol use: Yes    Comment: rarely    Drug use: Never   Sexual activity: Not Currently    Birth control/protection: Surgical  Other Topics Concern   Not on file  Social History Narrative   Not on file   Social Determinants of Health   Financial Resource Strain: Medium Risk (01/30/2023)   Overall Financial Resource Strain (CARDIA)    Difficulty of Paying Living Expenses: Somewhat hard  Food Insecurity: No Food Insecurity (02/17/2023)   Hunger Vital Sign    Worried About Running Out of Food in the Last Year: Never true    Ran Out of Food in the Last Year: Never true  Transportation Needs: No Transportation Needs (02/17/2023)   PRAPARE -  Administrator, Civil Service (Medical): No    Lack of Transportation (Non-Medical): No  Physical Activity: Sufficiently Active (01/28/2022)   Exercise Vital Sign    Days of Exercise per Week: 6 days    Minutes of Exercise per Session: 60 min  Stress: Stress Concern Present (01/30/2023)   [employer] of Occupational Health - Occupational Stress Questionnaire    Feeling of Stress : To some extent  Social Connections: Moderately Integrated (01/30/2023)   Social Connection and Isolation Panel [NHANES]    Frequency of Communication with Friends and Family: Once a week    Frequency of Social Gatherings with Friends and Family: Twice a week    Attends Religious Services: More than 4 times per year    Active Member of [employer] or Organizations: No    Attends Banker Meetings: Never    Marital Status: Married  Catering manager Violence: Not At Risk (02/17/2023)   Humiliation, Afraid, Rape, and Kick questionnaire    Fear of Current or Ex-Partner: No    Emotionally Abused: No    Physically Abused: No    Sexually Abused: No    FAMILY HISTORY: Family History  Problem Relation Age of Onset   Hypertension Mother    Diabetes Mother    Arthritis Mother    Heart disease Mother    Rheum arthritis Father    Diabetes Father    Heart disease Father    Stomach cancer Brother 64   Stomach cancer Brother 90   Esophageal cancer Brother 36   Breast cancer Maternal Aunt 61   Breast cancer Maternal Aunt 72   Breast cancer Maternal Aunt 73   Colon cancer Neg Hx    Colon polyps Neg Hx    Pancreatic cancer Neg Hx     Review of Systems  Constitutional:  Positive for fatigue. Negative for appetite change, chills, fever and unexpected weight change.  HENT:   Negative for hearing loss, lump/mass, mouth sores and trouble swallowing.   Eyes:  Negative for eye problems and icterus.  Respiratory:  Negative for chest tightness, cough and shortness of breath.   Cardiovascular:   Negative for chest pain, leg swelling and palpitations.  Gastrointestinal:  Positive for abdominal pain and constipation. Negative for abdominal distention, diarrhea, nausea and vomiting.  Endocrine: Negative for hot flashes.  Genitourinary:  Negative for difficulty urinating.   Musculoskeletal:  Positive for back pain. Negative for arthralgias.  Skin:  Negative for itching and rash.  Neurological:  Negative for dizziness, extremity weakness, headaches and numbness.  Hematological:  Negative for adenopathy. Does not bruise/bleed easily.  Psychiatric/Behavioral:  Negative for depression. The patient is not nervous/anxious.  PHYSICAL EXAMINATION   Onc Performance Status - 02/28/23 1406       ECOG Perf Status   ECOG Perf Status Restricted in physically strenuous activity but ambulatory and able to carry out work of a light or sedentary nature, e.g., light house work, office work      KPS SCALE   KPS % SCORE Normal activity with effort, some s/s of disease             Vitals:   02/28/23 1401  BP: 125/71  Pulse: 85  Resp: 16  Temp: 97.7 F (36.5 C)  SpO2: 98%    Physical Exam Constitutional:      General: She is not in acute distress.    Appearance: Normal appearance. She is not toxic-appearing.  HENT:     Head: Normocephalic and atraumatic.     Mouth/Throat:     Mouth: Mucous membranes are moist.     Pharynx: Oropharynx is clear. No oropharyngeal exudate or posterior oropharyngeal erythema.  Eyes:     General: No scleral icterus. Cardiovascular:     Rate and Rhythm: Normal rate and regular rhythm.     Pulses: Normal pulses.     Heart sounds: Normal heart sounds.  Pulmonary:     Effort: Pulmonary effort is normal.     Comments: Right lung is diminished left lung is clear throughout Abdominal:     General: Abdomen is flat. Bowel sounds are normal. There is no distension.     Palpations: Abdomen is soft.     Tenderness: There is abdominal tenderness (Mild  tenderness in lower abdomen which is unchanged per patient).  Musculoskeletal:        General: No swelling.     Cervical back: Neck supple.  Lymphadenopathy:     Cervical: No cervical adenopathy.  Skin:    General: Skin is warm and dry.     Findings: No rash.  Neurological:     General: No focal deficit present.     Mental Status: She is alert.  Psychiatric:        Mood and Affect: Mood normal.        Behavior: Behavior normal.     LABORATORY DATA:  Will add on lab testing today.     ASSESSMENT and THERAPY PLAN:   Breast carcinoma metastatic to multiple sites, right [facility] Gina Hunt is a 77 year old woman with newly metastatic estrogen positive breast cancer.    Metastatic ER positive breast cancer: We discussed that this is not curative however our goal now shifts to controlling the cancer.  I discontinued her tamoxifen and we will start her on fulvestrant.  We reviewed the risks and benefits in detail.  She is in agreement with proceeding with this next week.  We then discussed needing to set up restaging studies to understand the extent of her metastatic breast cancer and other areas that are involved.  He has CT chest abdomen pelvis scheduled on May 22 I am going to work with my nursing staff to see if they can get this expedited. Right pleural effusion: She has Pleurx placed and it is draining about 250 mL daily.  She is tolerating this well and her shortness of breath is managed with this approach. Cancer related pain: I refilled her oxycodone No. 60.  She has no signs of worrisome back pain however we will go ahead and expedite her restaging and if her back pain worsens we will obtain an MRI of the lumbar and sacral spine. Constipation:  She is going to use fleets enema today.  She has a good bowel regimen in place I have a feeling once she receives the fleets enema this will improve.  If it does not she and her friend will let me know. Abdominal Hernia: Out to Dr. Andrey Campanile to ensure  he is good with [place] expediting the scans he has ordered.  Gina Hunt tells me that she has advanced directives in place.  Her healthcare power of attorney is [name], retail.  Is the #1 contact listed in her chart.  She is going to bring this paperwork at her next visit that reviews this.   All questions were answered. The patient knows to call the clinic with any problems, questions or concerns. We can certainly see the patient much sooner if necessary.  Total encounter time:45 minutes*in face-to-face visit time, chart review, lab review, care coordination, order entry, and documentation of the encounter time.    Lillard Anes, NP 02/28/23 3:01 PM Medical Oncology and Hematology [facility] [address] [place], [place] [address] Tel. [phone]    Fax. [phone]  *Total Encounter Time as defined by the Centers for Medicare and Medicaid Services includes, in addition to the face-to-face time of a patient visit (documented in the note above) non-face-to-face time: obtaining and reviewing outside history, ordering and reviewing medications, tests or procedures, care coordination (communications with other health care professionals or caregivers) and documentation in the medical record.

## 2023-02-28 NOTE — Assessment & Plan Note (Addendum)
Gina Hunt is a 77 year old woman with newly metastatic estrogen positive breast cancer.    Metastatic ER positive breast cancer: We discussed that this is not curative however our goal now shifts to controlling the cancer.  I discontinued her tamoxifen and we will start her on fulvestrant.  We reviewed the risks and benefits in detail.  She is in agreement with proceeding with this next week.  We then discussed needing to set up restaging studies to understand the extent of her metastatic breast cancer and other areas that are involved.  He has CT chest abdomen pelvis scheduled on May 22 I am going to work with my nursing staff to see if they can get this expedited. Right pleural effusion: She has Pleurx placed and it is draining about 250 mL daily.  She is tolerating this well and her shortness of breath is managed with this approach. Cancer related pain: I refilled her oxycodone No. 60.  She has no signs of worrisome back pain however we will go ahead and expedite her restaging and if her back pain worsens we will obtain an MRI of the lumbar and sacral spine. Constipation: She is going to use fleets enema today.  She has a good bowel regimen in place I have a feeling once she receives the fleets enema this will improve.  If it does not she and her friend will let me know. Abdominal Hernia: Out to Dr. Andrey Campanile to ensure he is good with [place] expediting the scans he has ordered.  [name] tells me that she has advanced directives in place.  Her healthcare power of attorney is [name], retail.  Is the #1 contact listed in her chart.  She is going to bring this paperwork at her next visit that reviews this.

## 2023-03-01 DIAGNOSIS — J9601 Acute respiratory failure with hypoxia: Secondary | ICD-10-CM | POA: Diagnosis not present

## 2023-03-01 DIAGNOSIS — J942 Hemothorax: Secondary | ICD-10-CM | POA: Diagnosis not present

## 2023-03-01 DIAGNOSIS — Z4803 Encounter for change or removal of drains: Secondary | ICD-10-CM | POA: Diagnosis not present

## 2023-03-01 DIAGNOSIS — J90 Pleural effusion, not elsewhere classified: Secondary | ICD-10-CM | POA: Diagnosis not present

## 2023-03-01 DIAGNOSIS — E1165 Type 2 diabetes mellitus with hyperglycemia: Secondary | ICD-10-CM | POA: Diagnosis not present

## 2023-03-01 DIAGNOSIS — C50911 Malignant neoplasm of unspecified site of right female breast: Secondary | ICD-10-CM | POA: Diagnosis not present

## 2023-03-01 DIAGNOSIS — C799 Secondary malignant neoplasm of unspecified site: Secondary | ICD-10-CM | POA: Diagnosis not present

## 2023-03-01 DIAGNOSIS — Z4801 Encounter for change or removal of surgical wound dressing: Secondary | ICD-10-CM | POA: Diagnosis not present

## 2023-03-01 DIAGNOSIS — Z48813 Encounter for surgical aftercare following surgery on the respiratory system: Secondary | ICD-10-CM | POA: Diagnosis not present

## 2023-03-02 ENCOUNTER — Telehealth: Payer: Self-pay | Admitting: Pulmonary Disease

## 2023-03-02 ENCOUNTER — Ambulatory Visit (HOSPITAL_BASED_OUTPATIENT_CLINIC_OR_DEPARTMENT_OTHER)
Admission: RE | Admit: 2023-03-02 | Discharge: 2023-03-02 | Disposition: A | Discharge status: Home / Self Care | Payer: Medicare HMO | Source: Ambulatory Visit | Attending: Adult Health | Admitting: Adult Health

## 2023-03-02 ENCOUNTER — Other Ambulatory Visit: Payer: Self-pay

## 2023-03-02 ENCOUNTER — Telehealth: Payer: Self-pay

## 2023-03-02 DIAGNOSIS — C7951 Secondary malignant neoplasm of bone: Secondary | ICD-10-CM | POA: Diagnosis not present

## 2023-03-02 DIAGNOSIS — Z48813 Encounter for surgical aftercare following surgery on the respiratory system: Secondary | ICD-10-CM | POA: Diagnosis not present

## 2023-03-02 DIAGNOSIS — J939 Pneumothorax, unspecified: Secondary | ICD-10-CM | POA: Diagnosis not present

## 2023-03-02 DIAGNOSIS — C50412 Malignant neoplasm of upper-outer quadrant of left female breast: Secondary | ICD-10-CM | POA: Diagnosis not present

## 2023-03-02 DIAGNOSIS — K7689 Other specified diseases of liver: Secondary | ICD-10-CM | POA: Diagnosis not present

## 2023-03-02 DIAGNOSIS — Z4801 Encounter for change or removal of surgical wound dressing: Secondary | ICD-10-CM | POA: Diagnosis not present

## 2023-03-02 DIAGNOSIS — R0602 Shortness of breath: Secondary | ICD-10-CM | POA: Diagnosis not present

## 2023-03-02 DIAGNOSIS — Z4803 Encounter for change or removal of drains: Secondary | ICD-10-CM | POA: Diagnosis not present

## 2023-03-02 DIAGNOSIS — E1165 Type 2 diabetes mellitus with hyperglycemia: Secondary | ICD-10-CM | POA: Diagnosis not present

## 2023-03-02 DIAGNOSIS — Z170 Estrogen receptor positive status [ER+]: Secondary | ICD-10-CM

## 2023-03-02 DIAGNOSIS — K449 Diaphragmatic hernia without obstruction or gangrene: Secondary | ICD-10-CM

## 2023-03-02 DIAGNOSIS — J9601 Acute respiratory failure with hypoxia: Secondary | ICD-10-CM | POA: Diagnosis not present

## 2023-03-02 DIAGNOSIS — R1031 Right lower quadrant pain: Secondary | ICD-10-CM | POA: Diagnosis not present

## 2023-03-02 DIAGNOSIS — C50911 Malignant neoplasm of unspecified site of right female breast: Secondary | ICD-10-CM | POA: Diagnosis not present

## 2023-03-02 DIAGNOSIS — C801 Malignant (primary) neoplasm, unspecified: Secondary | ICD-10-CM | POA: Diagnosis not present

## 2023-03-02 DIAGNOSIS — C799 Secondary malignant neoplasm of unspecified site: Secondary | ICD-10-CM | POA: Diagnosis not present

## 2023-03-02 DIAGNOSIS — J90 Pleural effusion, not elsewhere classified: Secondary | ICD-10-CM | POA: Diagnosis not present

## 2023-03-02 DIAGNOSIS — J942 Hemothorax: Secondary | ICD-10-CM | POA: Diagnosis not present

## 2023-03-02 MED ORDER — IOHEXOL 300 MG/ML  SOLN
100.0000 mL | Freq: Once | INTRAMUSCULAR | Status: AC | PRN
  Administered 2023-03-02: 70 mL via INTRAVENOUS

## 2023-03-02 NOTE — Telephone Encounter (Signed)
Received inbasket from NP asking to move up Pt's CT CAP originally scheduled for 03/22/23. Original CT CAP ordered for [facility]. Per GI, 05/22 is soonest appt. Per [employer], [facility] has soonest available. D/Cd order and reordered as STAT. PA received. Soonest appt for today at 1730. Called and relayed to Pt who verbalized understanding.

## 2023-03-03 ENCOUNTER — Other Ambulatory Visit: Payer: Self-pay | Admitting: Adult Health

## 2023-03-03 ENCOUNTER — Other Ambulatory Visit: Payer: Medicare PPO

## 2023-03-03 ENCOUNTER — Ambulatory Visit: Payer: Medicare HMO | Admitting: Pulmonary Disease

## 2023-03-03 ENCOUNTER — Encounter: Payer: Self-pay | Admitting: Adult Health

## 2023-03-03 ENCOUNTER — Encounter: Payer: Self-pay | Admitting: Pulmonary Disease

## 2023-03-03 ENCOUNTER — Ambulatory Visit (INDEPENDENT_AMBULATORY_CARE_PROVIDER_SITE_OTHER): Payer: Medicare PPO | Admitting: Pulmonary Disease

## 2023-03-03 ENCOUNTER — Ambulatory Visit
Admission: RE | Admit: 2023-03-03 | Discharge: 2023-03-03 | Disposition: A | Discharge status: Home / Self Care | Payer: Medicare PPO | Source: Ambulatory Visit | Attending: Adult Health | Admitting: Adult Health

## 2023-03-03 VITALS — BP 118/72 | HR 74 | Ht 60.0 in | Wt 105.0 lb

## 2023-03-03 DIAGNOSIS — J910 Malignant pleural effusion: Secondary | ICD-10-CM | POA: Diagnosis not present

## 2023-03-03 DIAGNOSIS — C7981 Secondary malignant neoplasm of breast: Secondary | ICD-10-CM | POA: Diagnosis not present

## 2023-03-03 DIAGNOSIS — J948 Other specified pleural conditions: Secondary | ICD-10-CM

## 2023-03-03 DIAGNOSIS — M545 Low back pain, unspecified: Secondary | ICD-10-CM | POA: Insufficient documentation

## 2023-03-03 DIAGNOSIS — Z170 Estrogen receptor positive status [ER+]: Secondary | ICD-10-CM

## 2023-03-03 DIAGNOSIS — C50412 Malignant neoplasm of upper-outer quadrant of left female breast: Secondary | ICD-10-CM | POA: Insufficient documentation

## 2023-03-03 DIAGNOSIS — T8579XA Infection and inflammatory reaction due to other internal prosthetic devices, implants and grafts, initial encounter: Secondary | ICD-10-CM

## 2023-03-03 MED ORDER — GADOBUTROL 1 MMOL/ML IV SOLN
5.0000 mL | Freq: Once | INTRAVENOUS | Status: AC | PRN
  Administered 2023-03-03: 5 mL via INTRAVENOUS

## 2023-03-03 MED ORDER — CEPHALEXIN 500 MG PO CAPS
500.0000 mg | ORAL_CAPSULE | Freq: Four times a day (QID) | ORAL | 0 refills | Status: AC
Start: 2023-03-03 — End: 2023-03-08

## 2023-03-03 NOTE — Telephone Encounter (Signed)
This RN contacted Central scheduling per need to schedule STAT MRI for cord compression.  Post being on hold was informed " both facilities stated the next available would be Monday"  This RN verified with scheduler request was given with info of STAT for evaluation for cord compression due to CT's and pt's symptoms.Kemper Durie verified information was given.  This RN asked if availability at one of the out laying [facility] facilities could accommodate - and post 2nd hold was informed [facility] can provider STAT MRI for this patient tonight.  Instructions given by scheduler for location and how to check in.  This RN called pt and informed her of appt- location and to proceed asap - with pt verbalizing understanding and  she will go as soon as she "rounds up a driver but not a problem"

## 2023-03-03 NOTE — Progress Notes (Signed)
Gina Hunt has breast cancer metastatic to spine.  She was having back pain when I saw her in clinic earlier this week.  She notes her back pain has not improved.  She thinks sometimes in is worse.  She denies any numbness, or bowel/bladder incontinence.  She notes that the pain is worse when sitting and more when lying down.  She says sometimes it is so bad she has trouble walking.  She notes the pain improves somewhat with oxycodone.    Lillard Anes, NP 03/03/23 4:55 PM Medical Oncology and Hematology [facility] [address] [place], [place] [address] Tel. [phone]    Fax. [phone]

## 2023-03-03 NOTE — Patient Instructions (Addendum)
Take Cephalexin 500mg  4 times daily for 5 days for the skin irritation around the drain.  If the redness of the skin around the drain spreads please let us know  Drain fluid every other day. If still less than per drainage, then can move to 2 days per week.  Follow up in 2 months

## 2023-03-03 NOTE — Progress Notes (Signed)
Synopsis: Referred in May 2024 for hospital follow up of malignant pleural effusion  Subjective:   PATIENT ID: Gina Hunt, Gina Hunt   HPI  Chief Complaint  Patient presents with   Other    F/U for pleurx.    Gina Hunt is a 77 year old woman, never smoker with metastatic breast cancer who was admitted 4/19 to 4/25 for shortness of breath and new large right pleural effusion with cytology positive for breast adenocarcinoma. She had pleurX catheter placed 4/24. She returns to pulmonary clinic for suture removal today.   CT Chest 5/2 shows hydropneumothorax on right, pleuraX drain in place and basilar pleural thickening with pulmonary nodules on right and mediastinal adenopathy, increased lytic lesions of T and L spine.   Patient's dyspnea is better since placement of PleurX. No oxygen requirements. Patient's friend noted from erythema around drain site. No purulent drainage. She has no fevers, chills or sweats.   Past Medical History:  Diagnosis Date   Anxiety    Breast cancer (HCC) 01/2021   left breast IDC   Complication of anesthesia    Family history of breast cancer 12/16/2020   Family history of gastric cancer 12/16/2020   Fibromyalgia    GERD (gastroesophageal reflux disease)    with hiatal hernia   Hyperlipidemia    Hypothyroidism    Osteoarthritis    knees, hands, fingers   PONV (postoperative nausea and vomiting)    Port-A-Cath in place 05/26/2021   Skin cancer      Family History  Problem Relation Age of Onset   Hypertension Mother    Diabetes Mother    Arthritis Mother    Heart disease Mother    Rheum arthritis Father    Diabetes Father    Heart disease Father    Stomach cancer Brother 28   Stomach cancer Brother 85   Esophageal cancer Brother 35   Breast cancer Maternal Aunt 28   Breast cancer Maternal Aunt 72   Breast cancer Maternal Aunt 73   Colon cancer Neg Hx    Colon polyps Neg Hx    Pancreatic  cancer Neg Hx      Social History   Socioeconomic History   Marital status: Widowed    Spouse name: Not on file   Number of children: Not on file   Years of education: Not on file   Highest education level: Not on file  Occupational History   Occupation: retired  Tobacco Use   Smoking status: Never   Smokeless tobacco: Never  Vaping Use   Vaping Use: Never used  Substance and Sexual Activity   Alcohol use: Yes    Comment: rarely    Drug use: Never   Sexual activity: Not Currently    Birth control/protection: Surgical  Other Topics Concern   Not on file  Social History Narrative   Not on file   Social Determinants of Health   Financial Resource Strain: Medium Risk (01/30/2023)   Overall Financial Resource Strain (CARDIA)    Difficulty of Paying Living Expenses: Somewhat hard  Food Insecurity: No Food Insecurity (02/17/2023)   Hunger Vital Sign    Worried About Running Out of Food in the Last Year: Never true    Ran Out of Food in the Last Year: Never true  Transportation Needs: No Transportation Needs (02/17/2023)   PRAPARE - Administrator, Civil Service (Medical): No    Lack of Transportation (Non-Medical): No  Physical Activity: Sufficiently Active (01/28/2022)   Exercise Vital Sign    Days of Exercise per Week: 6 days    Minutes of Exercise per Session: 60 min  Stress: Stress Concern Present (01/30/2023)   [employer] of Occupational Health - Occupational Stress Questionnaire    Feeling of Stress : To some extent  Social Connections: Moderately Integrated (01/30/2023)   Social Connection and Isolation Panel [NHANES]    Frequency of Communication with Friends and Family: Once a week    Frequency of Social Gatherings with Friends and Family: Twice a week    Attends Religious Services: More than 4 times per year    Active Member of [employer] or Organizations: No    Attends Banker Meetings: Never    Marital Status: Married  Catering manager  Violence: Not At Risk (02/17/2023)   Humiliation, Afraid, Rape, and Kick questionnaire    Fear of Current or Ex-Partner: No    Emotionally Abused: No    Physically Abused: No    Sexually Abused: No     Allergies  Allergen Reactions   Codeine Other (See Comments)    Hallucination  Other reaction(s): Respiratory Distress   Morphine And Related Other (See Comments)    Knocks me out per pt     Outpatient Medications Prior to Visit  Medication Sig Dispense Refill   cycloSPORINE (RESTASIS) 0.05 % ophthalmic emulsion Place 1 drop into both eyes 2 (two) times daily. 0.4 mL 1   dicyclomine (BENTYL) 10 MG capsule Take 1 capsule (10 mg total) by mouth every 6 (six) hours as needed for spasms. 60 capsule 3   DULoxetine (CYMBALTA) 60 MG capsule TAKE 1 CAPSULE TWICE DAILY 180 capsule 3   esomeprazole (NEXIUM) 40 MG capsule Take 1 capsule (40 mg total) by mouth daily. 30 capsule 3   gabapentin (NEURONTIN) 300 MG capsule Take 1 capsule (300 mg total) by mouth 3 (three) times daily. 270 capsule 1   levothyroxine (SYNTHROID) 50 MCG tablet TAKE 1 TABLET EVERY DAY BEFORE BREAKFAST (Patient taking differently: Take 50 mcg by mouth daily before breakfast.) 90 tablet 3   olmesartan (BENICAR) 20 MG tablet Take 1 tablet (20 mg total) by mouth daily. 30 tablet 2   oxyCODONE (OXY IR/ROXICODONE) 5 MG immediate release tablet Take 1 tablet (5 mg total) by mouth every 6 (six) hours as needed for moderate pain. 60 tablet 0   simvastatin (ZOCOR) 40 MG tablet TAKE 1 TABLET EVERY DAY 90 tablet 2   No facility-administered medications prior to visit.   Review of Systems  Constitutional:  Negative for chills, fever, malaise/fatigue and weight loss.  HENT:  Negative for congestion, sinus pain and sore throat.   Eyes: Negative.   Respiratory:  Positive for shortness of breath. Negative for cough, hemoptysis, sputum production and wheezing.   Cardiovascular:  Positive for chest pain. Negative for palpitations,  orthopnea, claudication and leg swelling.  Gastrointestinal:  Negative for abdominal pain, heartburn, nausea and vomiting.  Genitourinary: Negative.   Musculoskeletal:  Negative for joint pain and myalgias.  Skin:  Negative for rash.  Neurological:  Negative for weakness.  Endo/Heme/Allergies: Negative.   Psychiatric/Behavioral: Negative.     Objective:   Vitals:   03/03/23 0942  BP: 118/72  Pulse: 74  SpO2: 96%  Weight: 105 lb (47.6 kg)  Height: 5' (1.524 m)    Physical Exam Constitutional:      General: She is not in acute distress.    Appearance: She is not  ill-appearing.  HENT:     Head: Normocephalic and atraumatic.  Eyes:     General: No scleral icterus.    Conjunctiva/sclera: Conjunctivae normal.     Pupils: Pupils are equal, round, and reactive to light.  Cardiovascular:     Rate and Rhythm: Normal rate and regular rhythm.     Pulses: Normal pulses.     Heart sounds: Normal heart sounds. No murmur heard. Pulmonary:     Effort: Pulmonary effort is normal.     Breath sounds: Normal breath sounds. No wheezing, rhonchi or rales.  Abdominal:     General: Bowel sounds are normal.     Palpations: Abdomen is soft.  Musculoskeletal:     Right lower leg: No edema.     Left lower leg: No edema.  Lymphadenopathy:     Cervical: No cervical adenopathy.  Skin:    Findings: Erythema (around PleurX drainage site) present.  Neurological:     General: No focal deficit present.     Mental Status: She is alert.    CBC    Component Value Date/Time   WBC 8.1 02/28/2023 1505   WBC 8.3 02/22/2023 0919   RBC 4.33 02/28/2023 1505   HGB 13.8 02/28/2023 1505   HCT 40.7 02/28/2023 1505   PLT 580 (H) 02/28/2023 1505   MCV 94.0 02/28/2023 1505   MCH 31.9 02/28/2023 1505   MCHC 33.9 02/28/2023 1505   RDW 12.6 02/28/2023 1505   LYMPHSABS 1.3 02/28/2023 1505   MONOABS 0.8 02/28/2023 1505   EOSABS 0.2 02/28/2023 1505   BASOSABS 0.1 02/28/2023 1505      Latest Ref Rng &  Units 02/28/2023    3:05 PM 02/22/2023    2:51 AM 02/18/2023    2:09 AM  BMP  Glucose 70 - 99 mg/dL 161  096  045   BUN 8 - 23 mg/dL 11  9  9    Creatinine 0.44 - 1.00 mg/dL 4.09  8.11  9.14   Sodium 135 - 145 mmol/L 137  130  135   Potassium 3.5 - 5.1 mmol/L 5.2  4.0  4.1   Chloride 98 - 111 mmol/L 100  96  102   CO2 22 - 32 mmol/L 31  25  26    Calcium 8.9 - 10.3 mg/dL 9.1  7.8  8.1    Chest imaging: CT Chest/abd/pelvis 03/02/23 1. Moderate sized right hydropneumothorax with pleural tube in appropriate position (query PleurX catheter). Redemonstration of nodular pleural thickening along the right pleura. Pneumothorax component new compared to prior. 2. Interval increase in size of an anterior mediastinal 2.8 x 2.2 (from 2.2 x 1.9 cm mass) as well as right hilar and mediastinal lymphadenopathy. 3. Scattered bilateral pulmonary nodules consistent with metastases. 4. Interval increase in number and size of multiple lytic osseous metastatic lesions along the T11 through L3 vertebral bodies. No definite associated pathologic fracture identified. 5. Indeterminate similar couple subcentimeter hepatic hypodensities. 6.  Aortic Atherosclerosis (ICD10-I70.0).    PFT:     No data to display          Labs:  Path:  Echo:  Heart Catheterization:    Assessment & Plan:   Hydropneumothorax  Malignant pleural effusion  Pleural drain infection, initial encounter (HCC) - Plan: cephALEXin (KEFLEX) 500 MG capsule  Discussion: Gina Hunt is a 77 year old woman, never smoker with metastatic breast cancer who was admitted 4/19 to 4/25 for shortness of breath and new large right pleural effusion with cytology positive for  breast adenocarcinoma. She had pleurX catheter placed 4/24.  We removed sutures for pleurX today. Will treat with 5 days of keflex 500mg , 4 times daily for possible infection near catheter site. She is to monitor for worsening in erythema or drainage and let us know as  soon as possible.  She can drainage PleurX every other day. If drainging or less, then she can decrease frequency to 2 times per week.   Follow up in 2 months.   Melody Comas, MD [facility] Office: [phone]    Current Outpatient Medications:    cephALEXin (KEFLEX) 500 MG capsule, Take 1 capsule (500 mg total) by mouth 4 (four) times daily for 5 days., Disp: 20 capsule, Rfl: 0   cycloSPORINE (RESTASIS) 0.05 % ophthalmic emulsion, Place 1 drop into both eyes 2 (two) times daily., Disp: 0.4 mL, Rfl: 1   dicyclomine (BENTYL) 10 MG capsule, Take 1 capsule (10 mg total) by mouth every 6 (six) hours as needed for spasms., Disp: 60 capsule, Rfl: 3   DULoxetine (CYMBALTA) 60 MG capsule, TAKE 1 CAPSULE TWICE DAILY, Disp: 180 capsule, Rfl: 3   esomeprazole (NEXIUM) 40 MG capsule, Take 1 capsule (40 mg total) by mouth daily., Disp: 30 capsule, Rfl: 3   gabapentin (NEURONTIN) 300 MG capsule, Take 1 capsule (300 mg total) by mouth 3 (three) times daily., Disp: 270 capsule, Rfl: 1   levothyroxine (SYNTHROID) 50 MCG tablet, TAKE 1 TABLET EVERY DAY BEFORE BREAKFAST (Patient taking differently: Take 50 mcg by mouth daily before breakfast.), Disp: 90 tablet, Rfl: 3   olmesartan (BENICAR) 20 MG tablet, Take 1 tablet (20 mg total) by mouth daily., Disp: 30 tablet, Rfl: 2   oxyCODONE (OXY IR/ROXICODONE) 5 MG immediate release tablet, Take 1 tablet (5 mg total) by mouth every 6 (six) hours as needed for moderate pain., Disp: 60 tablet, Rfl: 0   simvastatin (ZOCOR) 40 MG tablet, TAKE 1 TABLET EVERY DAY, Disp: 90 tablet, Rfl: 2

## 2023-03-04 IMAGING — CT CT ABD-PELV W/ CM
2 of 5 series · 15 of 46 positions shown, 17 images · IV contrast (agent unspecified)
Comparison: CT 11/11/2021

CLINICAL DATA: Diarrhea, GERD.  Prior breast cancer

EXAM:
CT ABDOMEN AND PELVIS WITH CONTRAST
TECHNIQUE: Multidetector CT imaging of the abdomen and pelvis was performed
using the standard protocol following bolus administration of
intravenous contrast.

[Series 2: axial st · axial · 0.80mm/px · z∈[-435,-55]mm · 12 of 86 slices shown, 14 images]
[im 5/86  soft-tissue]
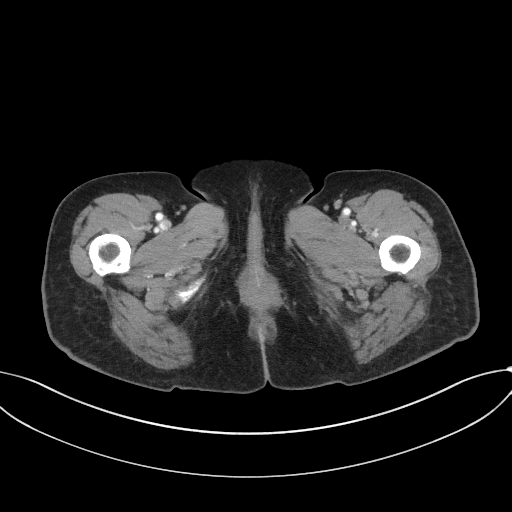
[im 5/86  bone]
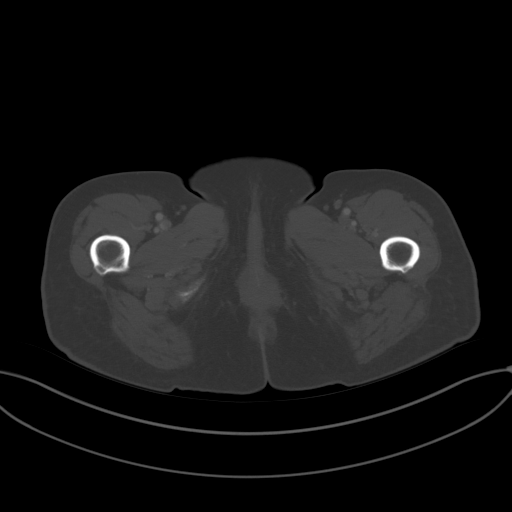
[im 14/86  soft-tissue]
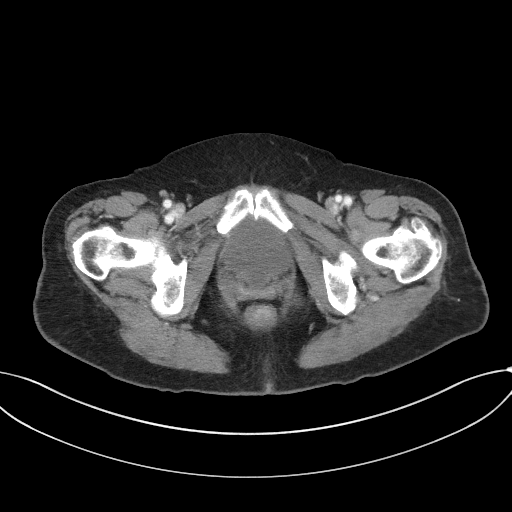
[im 18/86  soft-tissue]
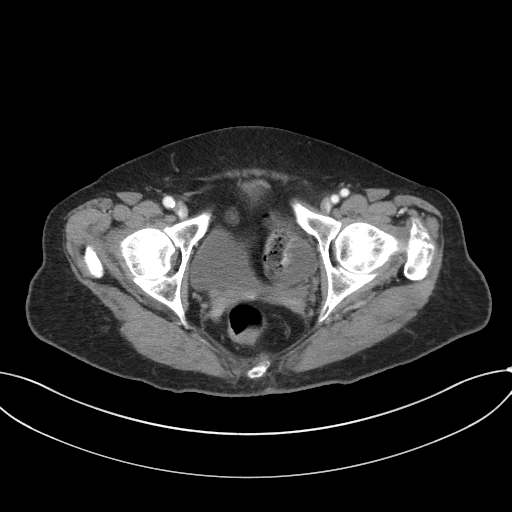
[im 27/86  soft-tissue]
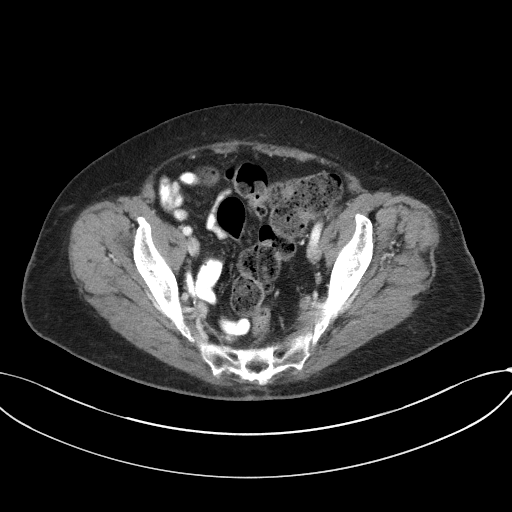
[im 32/86  soft-tissue]
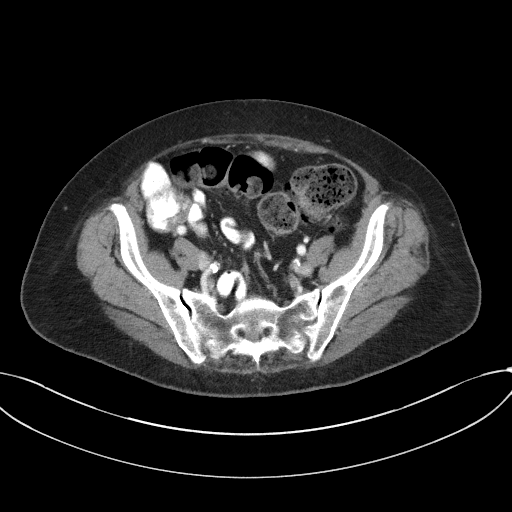
[im 41/86  soft-tissue]
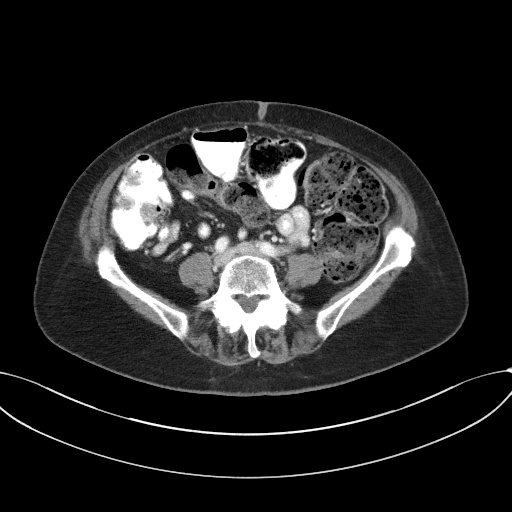
[im 45/86  soft-tissue]
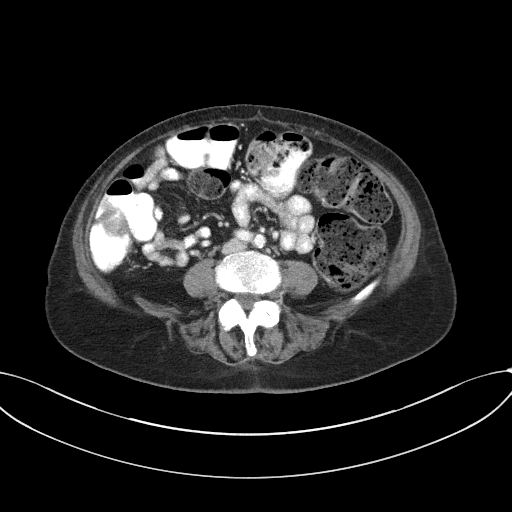
[im 54/86  soft-tissue]
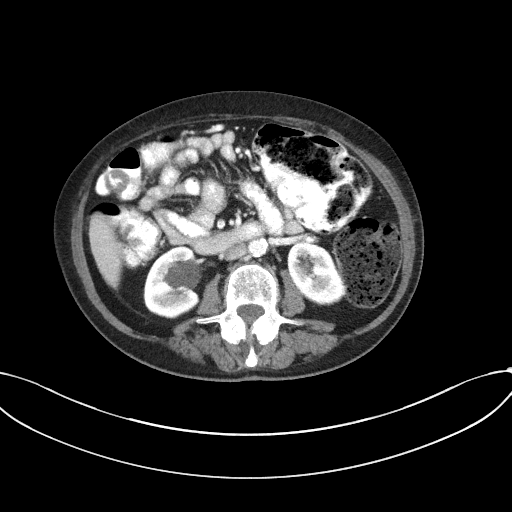
[im 59/86  soft-tissue]
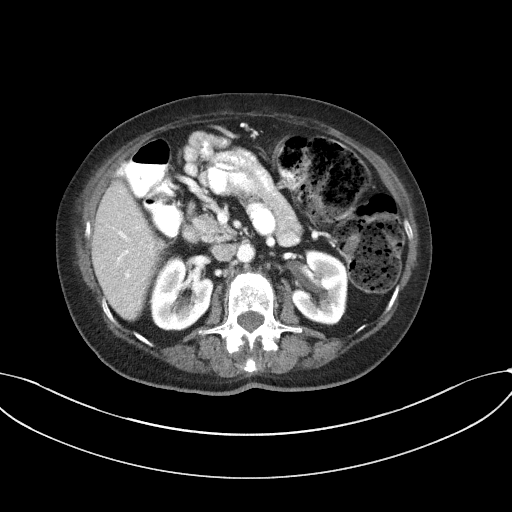
[im 59/86  bone]
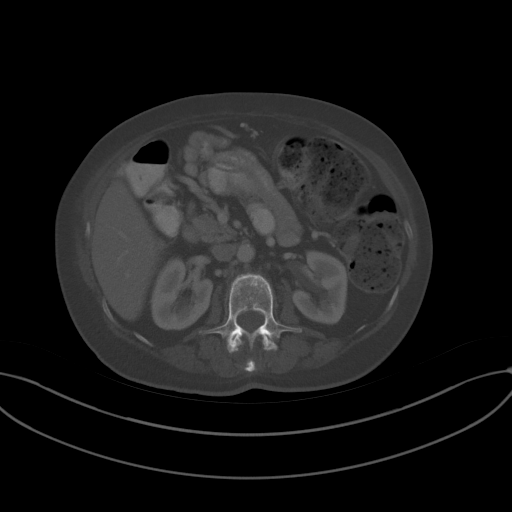
[im 68/86  soft-tissue]
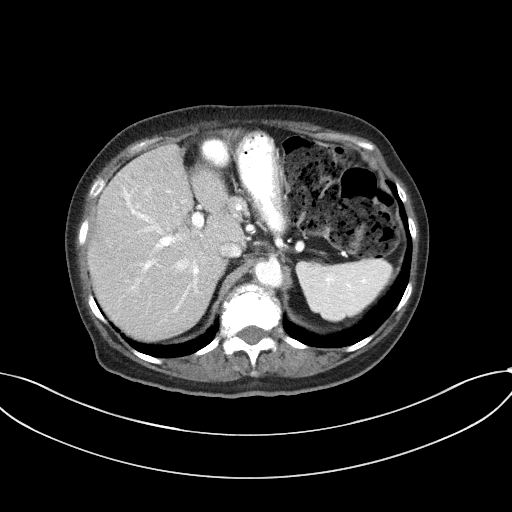
[im 72/86  soft-tissue]
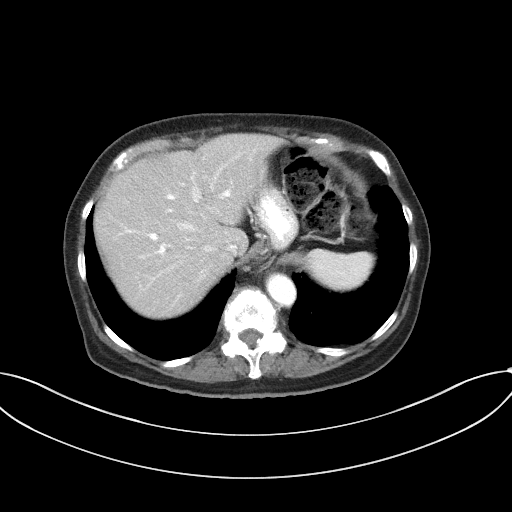
[im 81/86  soft-tissue]
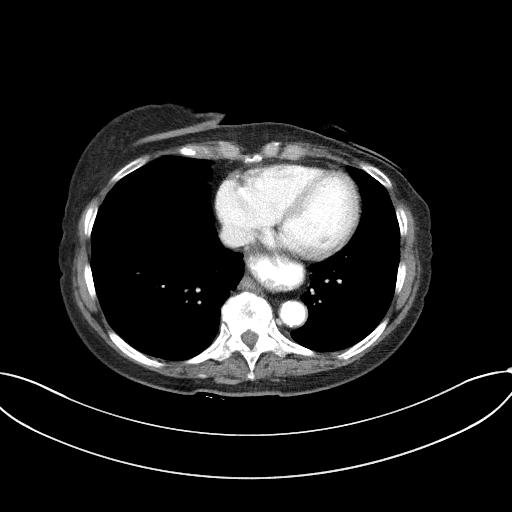

[Series 5: coronal st · coronal · 0.71mm/px · 3 of 84 slices shown]
[im 28/84  soft-tissue]
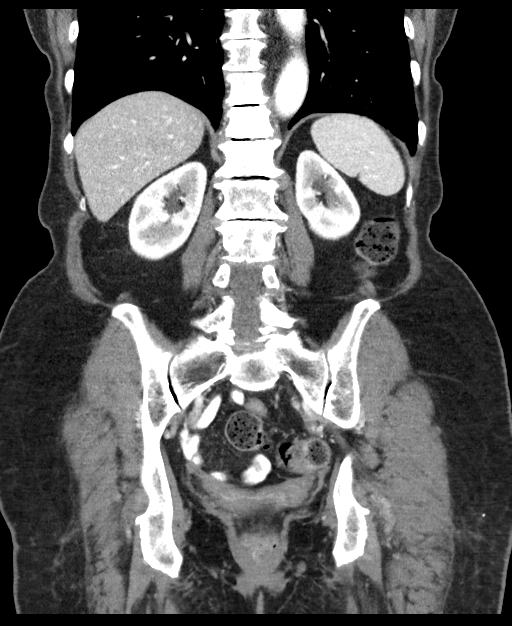
[im 37/84  soft-tissue]
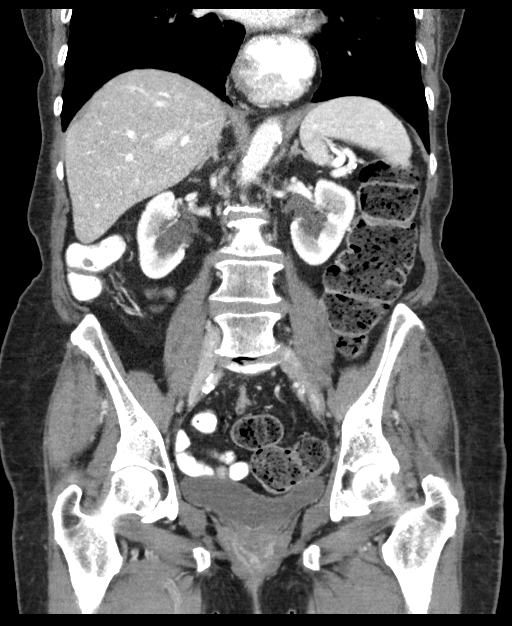
[im 47/84  soft-tissue]
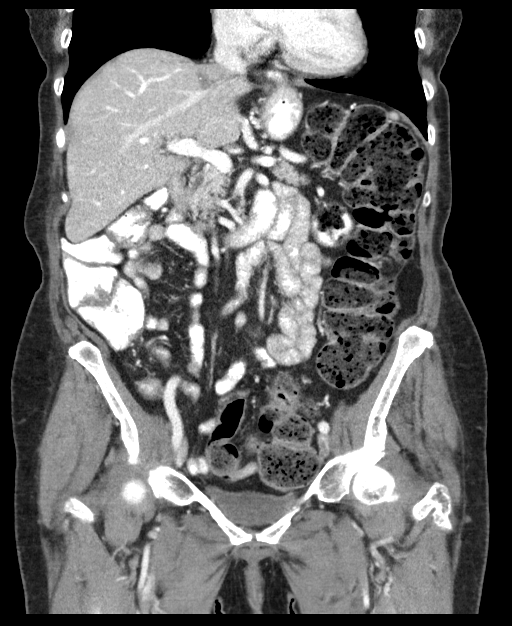

[15 of 46 positions shown; findings below may reference images not displayed]

RADIATION DOSE REDUCTION: This exam was performed according to the
departmental dose-optimization program which includes automated
exposure control, adjustment of the mA and/or kV according to
patient size and/or use of iterative reconstruction technique.

CONTRAST:  100mL OMNIPAQUE IOHEXOL 300 MG/ML  SOLN
FINDINGS: Lower chest: /B 2 small cysts in the LEFT hepatic lobe unchanged.
Gallbladder surgically absent. Common bile duct normal caliber.

Hepatobiliary: No focal hepatic lesion. No biliary duct dilatation.
Common bile duct is normal.

Pancreas: Pancreas is normal. No ductal dilatation. No pancreatic
inflammation.

Spleen: Is

Adrenals/urinary tract: Adrenal glands and kidneys are normal. The
ureters and bladder normal.

Stomach/Bowel: Moderate size hiatal hernia. Small bowel normal.
Terminal ileum normal. Appendix not identified. Large volume stool
in the distal transverse colon and descending colon. Large volume
stool in the sigmoid colon. Sigmoid colon and descending colon are
redundant. There is a focus of stricturing in the sigmoid colon
(image 54/5 and image 61/2). This is short segment of luminal
narrowing measuring 2 cm. There is less stool distal to this lesion.

Recent CT demonstrated a similar region of luminal narrowing and
inflammation through this sigmoid region, however, the lesion of
involvement was longer that exam. The stool burden was similar to
slightly less.

Vascular/Lymphatic: Abdominal aorta is normal caliber. No periportal
or retroperitoneal adenopathy. No pelvic adenopathy.

Reproductive: Post hysterectomy.  Adnexa unremarkable

Other: No free fluid.

Musculoskeletal: No aggressive osseous lesion.
IMPRESSION: 1. Large stool burden in the LEFT colon proximal to a region of
luminal narrowing. Recommend colonoscopy to exclude a obstructing
neoplastic lesion in the sigmoid colon. Findings similar to
comparison exam 11/11/2021 therefore could represent benign
stricturing.
2. No lymphadenopathy.
3. Benign cysts liver.
4. Large hiatal hernia.

These results will be called to the ordering clinician or
representative by the Radiologist Assistant, and communication
documented in the PACS or [REDACTED].

## 2023-03-06 ENCOUNTER — Other Ambulatory Visit: Payer: Self-pay | Admitting: Adult Health

## 2023-03-06 DIAGNOSIS — C7951 Secondary malignant neoplasm of bone: Secondary | ICD-10-CM

## 2023-03-06 DIAGNOSIS — Z4803 Encounter for change or removal of drains: Secondary | ICD-10-CM | POA: Diagnosis not present

## 2023-03-06 DIAGNOSIS — J90 Pleural effusion, not elsewhere classified: Secondary | ICD-10-CM | POA: Diagnosis not present

## 2023-03-06 DIAGNOSIS — Z4801 Encounter for change or removal of surgical wound dressing: Secondary | ICD-10-CM | POA: Diagnosis not present

## 2023-03-06 DIAGNOSIS — E1165 Type 2 diabetes mellitus with hyperglycemia: Secondary | ICD-10-CM | POA: Diagnosis not present

## 2023-03-06 DIAGNOSIS — Z48813 Encounter for surgical aftercare following surgery on the respiratory system: Secondary | ICD-10-CM | POA: Diagnosis not present

## 2023-03-06 DIAGNOSIS — J942 Hemothorax: Secondary | ICD-10-CM | POA: Diagnosis not present

## 2023-03-06 DIAGNOSIS — J9601 Acute respiratory failure with hypoxia: Secondary | ICD-10-CM | POA: Diagnosis not present

## 2023-03-06 DIAGNOSIS — Z170 Estrogen receptor positive status [ER+]: Secondary | ICD-10-CM

## 2023-03-06 DIAGNOSIS — C799 Secondary malignant neoplasm of unspecified site: Secondary | ICD-10-CM | POA: Diagnosis not present

## 2023-03-06 DIAGNOSIS — C50911 Malignant neoplasm of unspecified site of right female breast: Secondary | ICD-10-CM | POA: Diagnosis not present

## 2023-03-06 MED ORDER — DEXAMETHASONE 4 MG PO TABS
4.0000 mg | ORAL_TABLET | Freq: Three times a day (TID) | ORAL | 0 refills | Status: DC
Start: 2023-03-06 — End: 2023-04-18

## 2023-03-06 NOTE — Progress Notes (Signed)
Called and reviewed MRI spine results with patient.  I referred her to radiation and started Dexamethsone 4mg  three times a day.    I reviewed the above with her in detail.  She has had some discomfort in her lungs and has pleural effusion, and has not had it drained since Saturday.  She plans on having it drained this afternoon.  Her oxygen level today was 91% when she was working with HHPT.    I recommended she get the Dexamethasone today and start it today.  I also suggested she pick up a pulse oximeter at [facility] so she can monitor her oxygenation at home too and to call us if it goes below 90%.    We will see Gina Hunt tomorrow for her f/u with Dr. Al Pimple and to start Faslodex.    Lillard Anes, NP 03/06/23 2:07 PM Medical Oncology and Hematology [facility] [address] [place], [place] [address] Tel. [phone]    Fax. [phone]

## 2023-03-07 ENCOUNTER — Inpatient Hospital Stay: Payer: Medicare PPO

## 2023-03-07 ENCOUNTER — Ambulatory Visit
Admission: RE | Admit: 2023-03-07 | Discharge: 2023-03-07 | Disposition: A | Discharge status: Home / Self Care | Payer: Medicare PPO | Source: Ambulatory Visit | Attending: Radiation Oncology | Admitting: Radiation Oncology

## 2023-03-07 ENCOUNTER — Telehealth: Payer: Self-pay | Admitting: Radiation Oncology

## 2023-03-07 ENCOUNTER — Inpatient Hospital Stay (HOSPITAL_BASED_OUTPATIENT_CLINIC_OR_DEPARTMENT_OTHER): Payer: Medicare PPO | Admitting: Hematology and Oncology

## 2023-03-07 ENCOUNTER — Encounter: Payer: Self-pay | Admitting: Radiation Oncology

## 2023-03-07 VITALS — BP 131/72 | HR 85 | Temp 97.1°F | Resp 18 | Ht 60.0 in | Wt 104.0 lb

## 2023-03-07 VITALS — BP 130/63 | HR 83 | Temp 98.1°F | Resp 17 | Wt 104.8 lb

## 2023-03-07 DIAGNOSIS — C50412 Malignant neoplasm of upper-outer quadrant of left female breast: Secondary | ICD-10-CM

## 2023-03-07 DIAGNOSIS — E039 Hypothyroidism, unspecified: Secondary | ICD-10-CM | POA: Diagnosis not present

## 2023-03-07 DIAGNOSIS — Z803 Family history of malignant neoplasm of breast: Secondary | ICD-10-CM | POA: Diagnosis not present

## 2023-03-07 DIAGNOSIS — C7951 Secondary malignant neoplasm of bone: Secondary | ICD-10-CM | POA: Insufficient documentation

## 2023-03-07 DIAGNOSIS — M5125 Other intervertebral disc displacement, thoracolumbar region: Secondary | ICD-10-CM | POA: Diagnosis not present

## 2023-03-07 DIAGNOSIS — J90 Pleural effusion, not elsewhere classified: Secondary | ICD-10-CM | POA: Insufficient documentation

## 2023-03-07 DIAGNOSIS — Z170 Estrogen receptor positive status [ER+]: Secondary | ICD-10-CM | POA: Insufficient documentation

## 2023-03-07 DIAGNOSIS — Z510 Encounter for antineoplastic radiation therapy: Secondary | ICD-10-CM | POA: Diagnosis not present

## 2023-03-07 DIAGNOSIS — C50911 Malignant neoplasm of unspecified site of right female breast: Secondary | ICD-10-CM

## 2023-03-07 DIAGNOSIS — K219 Gastro-esophageal reflux disease without esophagitis: Secondary | ICD-10-CM | POA: Insufficient documentation

## 2023-03-07 DIAGNOSIS — C7800 Secondary malignant neoplasm of unspecified lung: Secondary | ICD-10-CM | POA: Insufficient documentation

## 2023-03-07 DIAGNOSIS — E785 Hyperlipidemia, unspecified: Secondary | ICD-10-CM | POA: Diagnosis not present

## 2023-03-07 DIAGNOSIS — Z79890 Hormone replacement therapy (postmenopausal): Secondary | ICD-10-CM | POA: Diagnosis not present

## 2023-03-07 DIAGNOSIS — Z85828 Personal history of other malignant neoplasm of skin: Secondary | ICD-10-CM | POA: Insufficient documentation

## 2023-03-07 DIAGNOSIS — Z5111 Encounter for antineoplastic chemotherapy: Secondary | ICD-10-CM | POA: Insufficient documentation

## 2023-03-07 DIAGNOSIS — Z800 Family history of malignant neoplasm of digestive organs: Secondary | ICD-10-CM | POA: Insufficient documentation

## 2023-03-07 DIAGNOSIS — G893 Neoplasm related pain (acute) (chronic): Secondary | ICD-10-CM

## 2023-03-07 DIAGNOSIS — K449 Diaphragmatic hernia without obstruction or gangrene: Secondary | ICD-10-CM | POA: Diagnosis not present

## 2023-03-07 DIAGNOSIS — M797 Fibromyalgia: Secondary | ICD-10-CM | POA: Diagnosis not present

## 2023-03-07 DIAGNOSIS — Z79899 Other long term (current) drug therapy: Secondary | ICD-10-CM | POA: Insufficient documentation

## 2023-03-07 DIAGNOSIS — C50411 Malignant neoplasm of upper-outer quadrant of right female breast: Secondary | ICD-10-CM | POA: Diagnosis not present

## 2023-03-07 DIAGNOSIS — Z7952 Long term (current) use of systemic steroids: Secondary | ICD-10-CM | POA: Insufficient documentation

## 2023-03-07 DIAGNOSIS — J948 Other specified pleural conditions: Secondary | ICD-10-CM | POA: Insufficient documentation

## 2023-03-07 LAB — CMP (CANCER CENTER ONLY)
ALT: 11 U/L (ref 0–44)
AST: 19 U/L (ref 15–41)
Albumin: 3.4 g/dL — ABNORMAL LOW (ref 3.5–5.0)
Alkaline Phosphatase: 82 U/L (ref 38–126)
Anion gap: 8 (ref 5–15)
BUN: 12 mg/dL (ref 8–23)
CO2: 29 mmol/L (ref 22–32)
Calcium: 8.8 mg/dL — ABNORMAL LOW (ref 8.9–10.3)
Chloride: 100 mmol/L (ref 98–111)
Creatinine: 0.68 mg/dL (ref 0.44–1.00)
GFR, Estimated: >60 mL/min (ref >60)
Glucose, Bld: 220 mg/dL — ABNORMAL HIGH (ref 70–99)
Potassium: 5.2 mmol/L — ABNORMAL HIGH (ref 3.5–5.1)
Sodium: 137 mmol/L (ref 135–145)
Total Bilirubin: 0.3 mg/dL (ref 0.3–1.2)
Total Protein: 7.1 g/dL (ref 6.5–8.1)

## 2023-03-07 LAB — CBC WITH DIFFERENTIAL (CANCER CENTER ONLY)
Abs Immature Granulocytes: 0.05 K/uL (ref 0.00–0.07)
Basophils Absolute: 0 K/uL (ref 0.0–0.1)
Basophils Relative: 0 %
Eosinophils Absolute: 0 K/uL (ref 0.0–0.5)
Eosinophils Relative: 0 %
HCT: 42 % (ref 36.0–46.0)
Hemoglobin: 14 g/dL (ref 12.0–15.0)
Immature Granulocytes: 1 %
Lymphocytes Relative: 9 %
Lymphs Abs: 0.9 K/uL (ref 0.7–4.0)
MCH: 31.7 pg (ref 26.0–34.0)
MCHC: 33.3 g/dL (ref 30.0–36.0)
MCV: 95 fL (ref 80.0–100.0)
Monocytes Absolute: 0.5 K/uL (ref 0.1–1.0)
Monocytes Relative: 4 %
Neutro Abs: 9.3 K/uL — ABNORMAL HIGH (ref 1.7–7.7)
Neutrophils Relative %: 86 %
Platelet Count: 501 K/uL — ABNORMAL HIGH (ref 150–400)
RBC: 4.42 MIL/uL (ref 3.87–5.11)
RDW: 13 % (ref 11.5–15.5)
WBC Count: 10.7 K/uL — ABNORMAL HIGH (ref 4.0–10.5)
nRBC: 0 % (ref 0.0–0.2)

## 2023-03-07 MED ORDER — FULVESTRANT 250 MG/5ML IM SOSY
500.0000 mg | PREFILLED_SYRINGE | Freq: Once | INTRAMUSCULAR | Status: AC
  Administered 2023-03-07: 500 mg via INTRAMUSCULAR
  Filled 2023-03-07: qty 10

## 2023-03-07 NOTE — Progress Notes (Signed)
Radiation Oncology         [phone] ________________________________  Name: Gina Hunt        MRN: [id number]  Date of Service: 03/07/2023 DOB: 1946/09/05  WU:[id number], [name], DO  Rachel Moulds, MD     REFERRING PHYSICIAN: Rachel Moulds, MD   DIAGNOSIS: The encounter diagnosis was Malignant neoplasm of upper-outer quadrant of left breast in female, estrogen receptor positive (HCC).   HISTORY OF PRESENT ILLNESS: Gina Hunt is a 77 y.o. female seen at the request of Dr. Al Pimple and Lillard Anes, NP for a recent diagnosis of recurrent metastatic breast cancer. The patient was originally diagnosed with Stage IIA [id number], grade 3 ER/PR positive invasive ductal carcinoma of the left breast. She was treated with left mastectomy with negative notes, and received adjuvant chemotherapy given a high oncotype dx score of 32. She was stared on adjuvant antiestrogen therapy with tamoxifen as well.   In April 2024 she was hospitalized with shortness of breath and found to have a large right pleural effusion that was consistent with her breast cancer primary confirmed by cytology and a pleurex catherer was placed. She had restaging scans on  03/02/23 taht showed a moderate sized hydropneumothorax with pleurex catheter in place and nodular thickening along the right pleura, and an interval increase in a mass seen on April CT scans in the anterior mediastinum measuring 2.8 cm in greatest dimension with right hilar and mediastinal adenopathy. Bilateral pulmonary nodules and multiple lytic lesions were seen in the skeletal system, especically concern in T11 and L3 vertebral bodies. She had indeterminate subcentimeter densities that were indeterminant. She also underwent an MRI of the thoracic and lumbar spine on 03/02/2022.  Her thoracic levels showed mild neural foraminal narrowing at T8-9 and mild bilateral neural foramen narrowing between T9-10, T10-11, and T10-12.  There were otherwise multiple  lesions throughout the cervical and thoracic spine at C6, T10, T11, and T12 with abnormal signal extending in the posterior elements of T11 and T12 with chronic wedging of T12 with likely epidural extension of T11 tumor.  In the lumbar spine there was concern for lesions at L1, L2, and L3 as well as S2.  Abnormal signal extended into the elder 1 posterior elements and through the posterior cortex of L1 there was epidural extension again noted between T11, L1 and L2 with moderate spinal canal stenosis and severe neural foraminal narrowing between T12 and L1 and L1 and L2.  Given these findings she has been referred to consider palliative radiotherapy.  Her systemic therapy will be Faslodex.  She was started on dexamethasone 4 mg 1 tablet p.o. 3 times daily.  She is seen to discuss radiotherapy.     PREVIOUS RADIATION THERAPY: {EXAM; YES/NO:19492::"No"}   PAST MEDICAL HISTORY:  Past Medical History:  Diagnosis Date   Anxiety    Breast cancer (HCC) 01/2021   left breast IDC   Complication of anesthesia    Family history of breast cancer 12/16/2020   Family history of gastric cancer 12/16/2020   Fibromyalgia    GERD (gastroesophageal reflux disease)    with hiatal hernia   Hyperlipidemia    Hypothyroidism    Osteoarthritis    knees, hands, fingers   PONV (postoperative nausea and vomiting)    Port-A-Cath in place 05/26/2021   Skin cancer        PAST SURGICAL HISTORY: Past Surgical History:  Procedure Laterality Date   ABDOMINAL HYSTERECTOMY     CHEST TUBE INSERTION N/A  02/22/2023   Procedure: INSERTION PLEURAL DRAINAGE CATHETER;  Surgeon: Martina Sinner, MD;  Location: [facility] ENDOSCOPY;  Service: Pulmonary;  Laterality: N/A;   COLONOSCOPY  11/24/2015   ESOPHAGEAL MANOMETRY N/A 07/20/2022   Procedure: ESOPHAGEAL MANOMETRY (EM);  Surgeon: Shellia Cleverly, DO;  Location: [facility] ENDOSCOPY;  Service: Gastroenterology;  Laterality: N/A;   EVACUATION BREAST HEMATOMA Left 04/07/2021    Procedure: EVACUATION HEMATOMA BREAST;  Surgeon: Emelia Loron, MD;  Location: [facility];  Service: General;  Laterality: Left;   LIVER SURGERY     Removed appendix and gall bladder   PORTA CATH INSERTION Right 04/07/2021   Procedure: PORTA CATH INSERTION;  Surgeon: Emelia Loron, MD;  Location: [facility];  Service: General;  Laterality: Right;   SIMPLE MASTECTOMY WITH AXILLARY SENTINEL NODE BIOPSY Left 04/07/2021   Procedure: LEFT SIMPLE MASTECTOMY WITH LEFT AXILLARY SENTINEL NODE BIOPSY;  Surgeon: Emelia Loron, MD;  Location: [facility];  Service: General;  Laterality: Left;   TONSILLECTOMY     TRIGGER FINGER RELEASE Right 03/03/2022     FAMILY HISTORY:  Family History  Problem Relation Age of Onset   Hypertension Mother    Diabetes Mother    Arthritis Mother    Heart disease Mother    Rheum arthritis Father    Diabetes Father    Heart disease Father    Stomach cancer Brother 94   Stomach cancer Brother 76   Esophageal cancer Brother 7   Breast cancer Maternal Aunt 70   Breast cancer Maternal Aunt 72   Breast cancer Maternal Aunt 73   Colon cancer Neg Hx    Colon polyps Neg Hx    Pancreatic cancer Neg Hx      SOCIAL HISTORY:  reports that she has never smoked. She has never used smokeless tobacco. She reports current alcohol use. She reports that she does not use drugs.  The patient is widowed and [facility] [place].   ALLERGIES: Codeine and Morphine and related   MEDICATIONS:  Current Outpatient Medications  Medication Sig Dispense Refill   cephALEXin (KEFLEX) 500 MG capsule Take 1 capsule (500 mg total) by mouth 4 (four) times daily for 5 days. 20 capsule 0   cycloSPORINE (RESTASIS) 0.05 % ophthalmic emulsion Place 1 drop into both eyes 2 (two) times daily. 0.4 mL 1   dexamethasone (DECADRON) 4 MG tablet Take 1 tablet (4 mg total) by mouth 3 (three) times daily. Take with food. 90 tablet 0    dicyclomine (BENTYL) 10 MG capsule Take 1 capsule (10 mg total) by mouth every 6 (six) hours as needed for spasms. 60 capsule 3   DULoxetine (CYMBALTA) 60 MG capsule TAKE 1 CAPSULE TWICE DAILY 180 capsule 3   esomeprazole (NEXIUM) 40 MG capsule Take 1 capsule (40 mg total) by mouth daily. 30 capsule 3   gabapentin (NEURONTIN) 300 MG capsule Take 1 capsule (300 mg total) by mouth 3 (three) times daily. 270 capsule 1   levothyroxine (SYNTHROID) 50 MCG tablet TAKE 1 TABLET EVERY DAY BEFORE BREAKFAST (Patient taking differently: Take 50 mcg by mouth daily before breakfast.) 90 tablet 3   olmesartan (BENICAR) 20 MG tablet Take 1 tablet (20 mg total) by mouth daily. 30 tablet 2   oxyCODONE (OXY IR/ROXICODONE) 5 MG immediate release tablet Take 1 tablet (5 mg total) by mouth every 6 (six) hours as needed for moderate pain. 60 tablet 0   simvastatin (ZOCOR) 40 MG tablet TAKE 1 TABLET EVERY DAY  90 tablet 2   No current facility-administered medications for this visit.     REVIEW OF SYSTEMS: On review of systems, the patient reports that ***  PHYSICAL EXAM:  Wt Readings from Last 3 Encounters:  03/03/23 105 lb (47.6 kg)  02/28/23 105 lb 9.6 oz (47.9 kg)  02/22/23 115 lb 1.3 oz (52.2 kg)   Temp Readings from Last 3 Encounters:  02/28/23 97.7 F (36.5 C) (Temporal)  02/23/23 98.5 F (36.9 C)  02/01/23 98 F (36.7 C) (Oral)   BP Readings from Last 3 Encounters:  03/03/23 118/72  02/28/23 125/71  02/23/23 (!) 102/57   Pulse Readings from Last 3 Encounters:  03/03/23 74  02/28/23 85  02/23/23 81    /10  In general this is a well appearing *** in no acute distress. ***'s alert and oriented x4 and appropriate throughout the examination. Cardiopulmonary assessment is negative for acute distress and *** exhibits normal effort.     ECOG = ***  0 - Asymptomatic (Fully active, able to carry on all predisease activities without restriction)  1 - Symptomatic but completely ambulatory  (Restricted in physically strenuous activity but ambulatory and able to carry out work of a light or sedentary nature. For example, light housework, office work)  2 - Symptomatic, <50% in bed during the day (Ambulatory and capable of all self care but unable to carry out any work activities. Up and about more than 50% of waking hours)  3 - Symptomatic, >50% in bed, but not bedbound (Capable of only limited self-care, confined to bed or chair 50% or more of waking hours)  4 - Bedbound (Completely disabled. Cannot carry on any self-care. Totally confined to bed or chair)  5 - Death   Santiago Glad MM, Creech RH, Tormey DC, et al. [phone]). "Toxicity and response criteria of the [facility]". Am. Evlyn Clines. Oncol. 5 (6): 649-55    LABORATORY DATA:  Lab Results  Component Value Date   WBC 8.1 02/28/2023   HGB 13.8 02/28/2023   HCT 40.7 02/28/2023   MCV 94.0 02/28/2023   PLT 580 (H) 02/28/2023   Lab Results  Component Value Date   NA 137 02/28/2023   K 5.2 (H) 02/28/2023   CL 100 02/28/2023   CO2 31 02/28/2023   Lab Results  Component Value Date   ALT 14 02/28/2023   AST 23 02/28/2023   ALKPHOS 84 02/28/2023   BILITOT 0.4 02/28/2023      RADIOGRAPHY: MR THORACIC SPINE W WO CONTRAST  Result Date: 03/03/2023 CLINICAL DATA:  Metastatic breast cancer EXAM: MRI THORACIC AND LUMBAR SPINE WITHOUT AND WITH CONTRAST TECHNIQUE: Multiplanar and multiecho pulse sequences of the thoracic and lumbar spine were obtained without and with intravenous contrast. CONTRAST:  5mL GADAVIST GADOBUTROL 1 MMOL/ML IV SOLN COMPARISON:  No prior MRI of the thoracic or lumbar spine. Correlation is made with 03/02/2023 CT chest abdomen pelvis. FINDINGS: Evaluation is limited by motion artifact. MRI THORACIC SPINE FINDINGS Alignment: No significant listhesis. S-shaped curvature of the thoracolumbar spine. Vertebrae: Multiple T1 hypointense, T2 hyperintense, and contrast-enhancing lesions imaged cervical  and thoracic spine, noted at C6 (series 26, image 6, T9 (series 26, image 6), T10 (series 26, image 5), T11, and T12 (series 26, image 8). Abnormal signal extends into the posterior elements of T11 and T12. Chronic wedging of T12, which appears similar to the 01/13/2022 CT abdomen pelvis. There is likely epidural extension of tumor at T11, better seen on the lumbar series.  No evidence of acute fracture in the thoracic spine. Cord: Evaluation is limited by motion. Within this limitation, the spinal cord is normal in morphology and signal, without abnormal enhancement. Paraspinal and other soft tissues: Moderate right hydropneumothorax. Findings are better evaluated on 03/02/2023 CT chest abdomen pelvis. Disc levels: Multilevel degenerative changes with small disc bulges and protrusions and facet arthropathy, without significant spinal canal stenosis. Mild left neural foraminal narrowing at T8-T9 and mild bilateral neural foraminal narrowing T9-T10, T10-T11, and T10-T12. MRI LUMBAR SPINE FINDINGS Segmentation:  5 lumbar type vertebral bodies. Alignment: Trace retrolisthesis of L1 on L2, L2 on L3, L3 on L4, and L5 on S1. Mild levocurvature. Vertebrae: T1 hypointense, T2 hyperintense, and contrast enhancing lesions in L1, L2, and L3 vertebral bodies (series 6, image 8), as well as in S2 (series 6, image 8). Abnormal signal extends into the L1 posterior elements and extends through the posterior cortex of L1. No evidence of acute fracture. Redemonstrated chronic wedging T12. Conus medullaris: Extends to the L2 level; evaluation of the cauda equina and for abnormal enhancement is limited by motion. Paraspinal and other soft tissues: Motion limited and better evaluated prior CT chest abdomen pelvis. Disc levels: T12-L1: Small central disc protrusion. Epidural extension of tumor along the ventral and right lateral aspects of the thecal sac (series 7, image 8), which causes moderate spinal canal stenosis. Moderate left and  severe right neural foraminal narrowing (series 2, image 6), in part secondary to tumor in the neural foramina. L1-L2: Epidural extension of tumor appears circumferential posterior to the L1 vertebral body (series 7, image 10) and near circumferential at L1-L2 (series 7, image 13). Moderate spinal canal stenosis. Severe bilateral neural foraminal narrowing, primarily secondary to tumor in the neural foramina (series 7, image 13). L2-L3: Disc height loss and small disc osteophyte complex. Moderate facet arthropathy. Narrowing of the lateral recesses. Mild spinal canal stenosis. No definite epidural extension of tumor. Mild bilateral neural foraminal narrowing. L3-L4: Disc height loss and disc osteophyte complex. Moderate facet arthropathy. Narrowing of the lateral recesses. Mild spinal canal stenosis. Mild right neural foraminal narrowing. L4-L5: Mild disc bulge. Moderate facet arthropathy. Narrowing of the lateral recesses. Mild spinal canal stenosis or neural foraminal narrowing. L5-S1: Disc height loss and small disc osteophyte complex. Mild facet arthropathy. Narrowing of the lateral recesses. Mild bilateral neural foraminal narrowing. IMPRESSION: 1. Evaluation is limited by motion artifact. Within this limitation, there are multiple metastatic lesions noted at C6 and T9-L3, as well as S2, with epidural extension of tumor at T11, L1, and L2. This results in moderate spinal canal stenosis at T12-L1 and L1-L2 and severe neural foraminal narrowing at T12-L1 and L1-L2, primarily secondary to tumor in the neural foramina. No significant spinal canal stenosis in the thoracic spine. 2. Additional degenerative changes in the thoracic and lumbar spine, as described above. 3. Moderate right hydropneumothorax, better evaluated on 03/02/2023 CT chest abdomen pelvis. 4. No acute fracture in the thoracic or lumbar spine. Stable vertebral body height loss at T12. Electronically Signed   By: Wiliam Ke M.D.   On: 03/03/2023  20:23   MR Lumbar Spine W Wo Contrast  Result Date: 03/03/2023 CLINICAL DATA:  Metastatic breast cancer EXAM: MRI THORACIC AND LUMBAR SPINE WITHOUT AND WITH CONTRAST TECHNIQUE: Multiplanar and multiecho pulse sequences of the thoracic and lumbar spine were obtained without and with intravenous contrast. CONTRAST:  5mL GADAVIST GADOBUTROL 1 MMOL/ML IV SOLN COMPARISON:  No prior MRI of the thoracic or lumbar spine. Correlation  is made with 03/02/2023 CT chest abdomen pelvis. FINDINGS: Evaluation is limited by motion artifact. MRI THORACIC SPINE FINDINGS Alignment: No significant listhesis. S-shaped curvature of the thoracolumbar spine. Vertebrae: Multiple T1 hypointense, T2 hyperintense, and contrast-enhancing lesions imaged cervical and thoracic spine, noted at C6 (series 26, image 6, T9 (series 26, image 6), T10 (series 26, image 5), T11, and T12 (series 26, image 8). Abnormal signal extends into the posterior elements of T11 and T12. Chronic wedging of T12, which appears similar to the 01/13/2022 CT abdomen pelvis. There is likely epidural extension of tumor at T11, better seen on the lumbar series. No evidence of acute fracture in the thoracic spine. Cord: Evaluation is limited by motion. Within this limitation, the spinal cord is normal in morphology and signal, without abnormal enhancement. Paraspinal and other soft tissues: Moderate right hydropneumothorax. Findings are better evaluated on 03/02/2023 CT chest abdomen pelvis. Disc levels: Multilevel degenerative changes with small disc bulges and protrusions and facet arthropathy, without significant spinal canal stenosis. Mild left neural foraminal narrowing at T8-T9 and mild bilateral neural foraminal narrowing T9-T10, T10-T11, and T10-T12. MRI LUMBAR SPINE FINDINGS Segmentation:  5 lumbar type vertebral bodies. Alignment: Trace retrolisthesis of L1 on L2, L2 on L3, L3 on L4, and L5 on S1. Mild levocurvature. Vertebrae: T1 hypointense, T2 hyperintense, and  contrast enhancing lesions in L1, L2, and L3 vertebral bodies (series 6, image 8), as well as in S2 (series 6, image 8). Abnormal signal extends into the L1 posterior elements and extends through the posterior cortex of L1. No evidence of acute fracture. Redemonstrated chronic wedging T12. Conus medullaris: Extends to the L2 level; evaluation of the cauda equina and for abnormal enhancement is limited by motion. Paraspinal and other soft tissues: Motion limited and better evaluated prior CT chest abdomen pelvis. Disc levels: T12-L1: Small central disc protrusion. Epidural extension of tumor along the ventral and right lateral aspects of the thecal sac (series 7, image 8), which causes moderate spinal canal stenosis. Moderate left and severe right neural foraminal narrowing (series 2, image 6), in part secondary to tumor in the neural foramina. L1-L2: Epidural extension of tumor appears circumferential posterior to the L1 vertebral body (series 7, image 10) and near circumferential at L1-L2 (series 7, image 13). Moderate spinal canal stenosis. Severe bilateral neural foraminal narrowing, primarily secondary to tumor in the neural foramina (series 7, image 13). L2-L3: Disc height loss and small disc osteophyte complex. Moderate facet arthropathy. Narrowing of the lateral recesses. Mild spinal canal stenosis. No definite epidural extension of tumor. Mild bilateral neural foraminal narrowing. L3-L4: Disc height loss and disc osteophyte complex. Moderate facet arthropathy. Narrowing of the lateral recesses. Mild spinal canal stenosis. Mild right neural foraminal narrowing. L4-L5: Mild disc bulge. Moderate facet arthropathy. Narrowing of the lateral recesses. Mild spinal canal stenosis or neural foraminal narrowing. L5-S1: Disc height loss and small disc osteophyte complex. Mild facet arthropathy. Narrowing of the lateral recesses. Mild bilateral neural foraminal narrowing. IMPRESSION: 1. Evaluation is limited by motion  artifact. Within this limitation, there are multiple metastatic lesions noted at C6 and T9-L3, as well as S2, with epidural extension of tumor at T11, L1, and L2. This results in moderate spinal canal stenosis at T12-L1 and L1-L2 and severe neural foraminal narrowing at T12-L1 and L1-L2, primarily secondary to tumor in the neural foramina. No significant spinal canal stenosis in the thoracic spine. 2. Additional degenerative changes in the thoracic and lumbar spine, as described above. 3. Moderate right hydropneumothorax, better  evaluated on 03/02/2023 CT chest abdomen pelvis. 4. No acute fracture in the thoracic or lumbar spine. Stable vertebral body height loss at T12. Electronically Signed   By: Wiliam Ke M.D.   On: 03/03/2023 20:23   CT CHEST ABDOMEN PELVIS W CONTRAST  Result Date: 03/02/2023 CLINICAL DATA:  Breast cancer, invasive, stage I/II/III, initial workup RLQ abdominal pain and shortness of breath, evaluate for metastasis EXAM: CT CHEST, ABDOMEN, AND PELVIS WITH CONTRAST TECHNIQUE: Multidetector CT imaging of the chest, abdomen and pelvis was performed following the standard protocol during bolus administration of intravenous contrast. RADIATION DOSE REDUCTION: This exam was performed according to the departmental dose-optimization program which includes automated exposure control, adjustment of the mA and/or kV according to patient size and/or use of iterative reconstruction technique. CONTRAST:  70mL OMNIPAQUE IOHEXOL 300 MG/ML  SOLN COMPARISON:  CT angio chest 06/14/2023 CT abdomen pelvis 01/13/2022 FINDINGS: CT CHEST FINDINGS Cardiovascular: Normal heart size. No significant pericardial effusion. The thoracic aorta is normal in caliber. Moderate atherosclerotic plaque of the thoracic aorta. No coronary artery calcifications. The main pulmonary artery is normal in caliber. No central or proximal segmental pulmonary embolus. Limited evaluation more distally due to timing of contrast.  Mediastinum/Nodes: Interval increase in size of an anterior mediastinal 2.8 x 2.2 (from 2.2 x 1.9 cm mass (3:36). Another mediastinal mass is noted along the inferior vena cava hiatus and measures 1.8 x 1.6 cm (3:40). Enhancing subcarinal lymph node measuring 0.8 cm (3:25). A 1.1 cm enlarged right hilar lymph node (3:28). No left hilar axillary lymph nodes. Left axillary lymph node dissection. Thyroid gland, trachea, and esophagus demonstrate no significant findings. Small to moderate hiatal hernia. Lungs/Pleura: No focal consolidation. Grossly stable scattered left lung pulmonary nodules with as an example a 6 mm left lower lobe pulmonary nodule (3:35). Scattered right lung pulmonary nodules with as an example a 5 mm right lower lobe lesion (3:26) with limited comparison compared to prior CT angio given complete collapse of the right lung on prior study. No pulmonary mass. Moderate sized right hydropneumothorax with pleural tube is noted in appropriate position (query PleurX catheter). Redemonstration of nodular pleural thickening along the right pleura. No left pneumothorax. No left pleural effusion. Musculoskeletal: No chest wall abnormality.  Left mastectomy. Interval increase in number and size of multiple lytic osseous metastatic lesions along the T11 through L3 vertebral bodies. No definite associated pathologic fracture identified. No acute displaced fracture. Multilevel degenerative changes of the spine. CT ABDOMEN PELVIS FINDINGS Hepatobiliary: Similar couple subcentimeter hypodensities that are nonspecific. No gallstones, gallbladder wall thickening, or pericholecystic fluid. No biliary dilatation. Pancreas: No focal lesion. Normal pancreatic contour. No surrounding inflammatory changes. No main pancreatic ductal dilatation. Hip Normal in size without focal abnormality. Spleen: No adrenal nodule bilaterally. Bilateral kidneys enhance symmetrically. No hydronephrosis. No hydroureter. The urinary bladder is  unremarkable. Adrenals/Urinary Tract: No adrenal nodule bilaterally. Bilateral kidneys enhance symmetrically. No hydronephrosis. No hydroureter. The urinary bladder is unremarkable. On delayed imaging, there is no urothelial wall thickening and there are no filling defects in the opacified portions of the bilateral collecting systems or ureters. Stomach/Bowel: Stomach is within normal limits. No evidence of bowel wall thickening or dilatation. The appendix is not definitely identified with no inflammatory changes in the right lower quadrant to suggest acute appendicitis. Vascular/Lymphatic: No abdominal aorta or iliac aneurysm. Severe atherosclerotic plaque of the aorta and its branches. No abdominal, pelvic, or inguinal lymphadenopathy. Reproductive: Status post hysterectomy. No adnexal masses. Other: No intraperitoneal free fluid.  No intraperitoneal free gas. No organized fluid collection. Musculoskeletal: No abdominal wall hernia or abnormality. Interval increase in number and size of multiple lytic osseous metastatic lesions along the T11 through L3 vertebral bodies. No definite associated pathologic fracture identified. No acute displaced fracture. Multilevel degenerative changes of the spine. IMPRESSION: 1. Moderate sized right hydropneumothorax with pleural tube in appropriate position (query PleurX catheter). Redemonstration of nodular pleural thickening along the right pleura. Pneumothorax component new compared to prior. 2. Interval increase in size of an anterior mediastinal 2.8 x 2.2 (from 2.2 x 1.9 cm mass) as well as right hilar and mediastinal lymphadenopathy. 3. Scattered bilateral pulmonary nodules consistent with metastases. 4. Interval increase in number and size of multiple lytic osseous metastatic lesions along the T11 through L3 vertebral bodies. No definite associated pathologic fracture identified. 5. Indeterminate similar couple subcentimeter hepatic hypodensities. 6.  Aortic Atherosclerosis  (ICD10-I70.0). Electronically Signed   By: Tish Frederickson M.D.   On: 03/02/2023 19:10   DG CHEST PORT 1 VIEW  Result Date: 02/22/2023 CLINICAL DATA:  Chest tube placement, pleural effusion EXAM: PORTABLE CHEST 1 VIEW COMPARISON:  02/20/2023 FINDINGS: Single frontal view of the chest demonstrates interval placement of a right-sided pleural drainage catheter. Decreased right pleural effusion since prior study, with minimal residual effusion obscuring the right costophrenic angle. Continued central vascular congestion. Improved aeration at the lung bases, with residual streaky opacities within the bilateral lower lobes. No pneumothorax. No acute bony abnormalities. IMPRESSION: 1. Decreased right pleural effusion after right pleural drainage catheter placement. 2. Continued vascular congestion, with improved aeration at the lung bases. Electronically Signed   By: Sharlet Salina M.D.   On: 02/22/2023 15:20   DG CHEST PORT 1 VIEW  Result Date: 02/20/2023 CLINICAL DATA:  Pleural effusion EXAM: PORTABLE CHEST 1 VIEW COMPARISON:  Portable exam 1211 hours compared to 02/18/2023 FINDINGS: Normal heart size and pulmonary vascularity. Atherosclerotic calcification aorta. Moderate-sized hiatal hernia. Moderate RIGHT pleural effusion and basilar atelectasis. Coexistent infiltrate RIGHT lung and LEFT lower lobe atelectasis. No pneumothorax or acute osseous findings. Diffuse osseous demineralization. IMPRESSION: Moderate LEFT pleural effusion and basilar atelectasis. Subsegmental atelectasis LEFT lower lobe with additional RIGHT lung infiltrate which could represent asymmetric edema or infection. Electronically Signed   By: Ulyses Southward M.D.   On: 02/20/2023 13:14   DG CHEST PORT 1 VIEW  Result Date: 02/18/2023 CLINICAL DATA:  Post thoracentesis EXAM: PORTABLE CHEST 1 VIEW COMPARISON:  02/17/2023 FINDINGS: Stable heart size. Small hiatal hernia. Aortic atherosclerosis. Moderate right pleural effusion, decreased from  prior. Persistent but improving airspace opacities throughout the right lung. No pneumothorax. Left lung remains clear aside from minor left basilar atelectasis. IMPRESSION: 1. Moderate right pleural effusion, decreased following thoracentesis. No pneumothorax. 2. Persistent but improving airspace opacities throughout the right lung. Electronically Signed   By: Duanne Guess D.O.   On: 02/18/2023 13:55   DG CHEST PORT 1 VIEW  Result Date: 02/17/2023 CLINICAL DATA:  Status post thoracentesis. EXAM: PORTABLE CHEST 1 VIEW COMPARISON:  Radiograph and chest CT 02/16/2023 FINDINGS: Decreased size of the large right pleural effusion since yesterday after thoracentesis. Improved aeration of the right upper lobe. Left basilar atelectasis/scarring. Similar leftward mediastinal shift. Stable cardiomediastinal silhouette. Aortic atherosclerotic calcification. No pneumothorax. IMPRESSION: Large left pleural effusion, slightly decreased from 02/16/2023. Improved aeration of the right upper lobe. No pneumothorax. Electronically Signed   By: Minerva Fester M.D.   On: 02/17/2023 16:30   CT Angio Chest PE W and/or Wo Contrast  Result  Date: 02/16/2023 CLINICAL DATA:  Shortness of breath for 3 weeks. EXAM: CT ANGIOGRAPHY CHEST WITH CONTRAST TECHNIQUE: Multidetector CT imaging of the chest was performed using the standard protocol during bolus administration of intravenous contrast. Multiplanar CT image reconstructions and MIPs were obtained to evaluate the vascular anatomy. RADIATION DOSE REDUCTION: This exam was performed according to the departmental dose-optimization program which includes automated exposure control, adjustment of the mA and/or kV according to patient size and/or use of iterative reconstruction technique. CONTRAST:  75mL OMNIPAQUE IOHEXOL 350 MG/ML SOLN COMPARISON:  Chest x-ray 02/16/2023 earlier. CT abdomen pelvis 01/13/2022 FINDINGS: Cardiovascular: The heart is nonenlarged. Trace pericardial fluid.  Coronary artery calcifications are seen. The thoracic aorta has some mild atherosclerotic plaque. There is significant breathing motion identified. This limits evaluation of small and peripheral emboli, nondiagnostic for such small foci. No segmental or larger pulmonary embolism identified. Mediastinum/Nodes: Severe shift of the mediastinum from right to left. Mildly patulous esophagus. Moderate hiatal hernia. No abnormal lymph node enlargement identified in the axillary regions, left hilum. There are however areas of presumed abnormal nodes in the mediastinum such as series 7, image 179 measuring 2.2 by 1.7 cm. This also could be a soft tissue mass. Additional nodule seen posterior to the sternum inferiorly. Additional other abnormal anterior cardiophrenic nodes. Lungs/Pleura: Complete opacification of the right hemithorax with collapse of the right lung. Very large right-sided pleural effusion with areas of pleural thickening and nodularity. Example posteriorly on series 5, image 85 measuring up to 9 mm in thickness. There is suggestion of the central mass the right lung hilum with the occlusion of bronchi. This areas difficult to define in detail with the level of shift in mass effect. Tissues in this location are distorted. The left lung has several small lung nodules. Example lingula series 5, image 45 measuring 5 mm, left lower lobe series 5, image 68 measuring 13 mm. Several others. Upper Abdomen: Slight nodular contours of the liver. Musculoskeletal: Curvature and degenerative changes along the spine. There are mixed lucent and sclerotic bone lesions identified in the spine for example at T11, T12, L1-L2. Bone metastases are possible. Left-sided mastectomy with axillary surgical clips. Review of the MIP images confirms the above findings. IMPRESSION: Findings worrisome for malignancy. Nodular areas of pleural thickening along the right hemithorax with a very large right pleural effusion causing mass effect  with severe shift of the mediastinum from right to left and collapse of the right lung. Distortion of the tissues in the right hilum, possible mass, but incompletely defined on this examination. Several left-sided lung nodules, mediastinal lymph nodes. Mixed lucent sclerotic bone lesions identified along the spine at the thoracolumbar junction. Possible metastatic disease of the bone. Additional workup with whole-body bone scan as clinically appropriate. Overall, there may be benefit to a follow-up CT scan with contrast after thoracentesis to decrease the lung distortion and improved visualization of the hilum and mediastinum. Significant breathing motion. No segmental or larger pulmonary embolism. Aortic Atherosclerosis (ICD10-I70.0). Electronically Signed   By: Karen Kays M.D.   On: 02/16/2023 17:29   DG Chest 2 View  Result Date: 02/16/2023 CLINICAL DATA:  SOB EXAM: CHEST - 2 VIEW COMPARISON:  08/30/2021 FINDINGS: Right hemithorax completely opacified. No pneumothorax identified. Vascular congestion identified on the left. There is calcification of the aorta. Small hiatal hernia. IMPRESSION: Right hemithorax completely opacified, a new finding. Electronically Signed   By: Layla Maw M.D.   On: 02/16/2023 16:19       IMPRESSION/PLAN: 1.  Recurrent metastatic IIA cT2N0M0, grade 3 ER/PR positive invasive ductal carcinoma of the left breast now with bone, nodal, and pulmonary metastases.  In a visit lasting *** minutes, greater than 50% of the time was spent face to face discussing the patient's condition, in preparation for the discussion, and coordinating the patient's care.   The above documentation reflects my direct findings during this shared patient visit. Please see the separate note by Dr. Mitzi Hansen on this date for the remainder of the patient's plan of care.    Osker Mason, [facility]   **Disclaimer: This note was dictated with voice recognition software. Similar sounding words can  inadvertently be transcribed and this note may contain transcription errors which may not have been corrected upon publication of note.**

## 2023-03-07 NOTE — Progress Notes (Addendum)
Nursing interview for a 77 yr old female w/ Breast carcinoma metastatic to multiple sites, right (HCC). Patient identity verified x2.   Patient reports mid back pain 7/10, and fatigue. No other related issues reported at this time.  Meaningful use complete  Vitals- BP 131/72 (BP Location: Left Arm, Patient Position: Sitting)   Pulse 85   Temp (!) 97.1 F (36.2 C) (Temporal)   Resp 18   Ht 5' (1.524 m)   Wt 104 lb (47.2 kg)   SpO2 100%   BMI 20.31 kg/m   This concludes the interview.   Ruel Favors, LPN

## 2023-03-07 NOTE — Patient Instructions (Signed)

## 2023-03-07 NOTE — Telephone Encounter (Signed)
Spoke directly to pt in reference to urgent consult referral from Lillard Anes NP. Pt agreed to be seen 5/7 @ 2:00pm for consult, 5/8 @ 8:00am for SIM to possibly start treatment to spine asap. All questions were answered.  Overlap noted with labs and consult. NP Axel Filler was advised of this. She stated she would move this lab appt asap to allow pt to complete consult due to urgency.

## 2023-03-07 NOTE — Progress Notes (Unsigned)
[facility] Cancer Follow up:    Sharlene Dory, DO [address] [place] [address]   DIAGNOSIS:  Cancer Staging  Malignant neoplasm of upper-outer quadrant of left breast in female, estrogen receptor positive (HCC) Staging form: Breast, AJCC 8th Edition - Clinical stage from 12/16/2020: Stage IIA (cT2, cN0, cM0, G3, ER+, PR+, HER2-) - Signed by Lowella Dell, MD on 12/16/2020 Stage prefix: Initial diagnosis   SUMMARY OF ONCOLOGIC HISTORY: [place] woman status post left breast upper outer quadrant biopsy 12/08/2020 for a clinical T2N0, stage IIA invasive ductal carcinoma, grade 3, estrogen and progesterone receptor positive, HER-2 not amplified, with an MIB-1 of 30%   (1) Oncotype obtained from the original biopsy shows a score of 32, predicting a risk of recurrence outside the breast in the next 9 years of 20% if node-negative, 25% if node positive, if antiestrogens are the only systemic treatment, also predicting a greater than 15% benefit from chemotherapy   (2) genetics testing 01/01/2021 through the [facility] CustomNext-Cancer +RNAinsight Panel found no deleterious mutations in APC, ATM, AXIN2, BARD1, BMPR1A, BRCA1, BRCA2, BRIP1, CDH1, CDK4, CDKN2A, CHEK2, DICER1, EPCAM, GREM1, HOXB13, MEN1, MLH1, MSH2, MSH3, MSH6, MUTYH, NBN, NF1, NF2, NTHL1, PALB2, PMS2, POLD1, POLE, PTEN, RAD51C, RAD51D, RECQL, RET, SDHA, SDHAF2, SDHB, SDHC, SDHD, SMAD4, SMARCA4, STK11, TP53, TSC1, TSC2, and VHL.  RNA data is routinely analyzed for use in variant interpretation for all genes.   (3) anastrozole started neoadjuvantly 12/16/2020, discontinued 03/2021 with multiple side effects   (4) status post left mastectomy and sentinel lymph node sampling 04/07/2021 for a pT2 pN0, stage IIA invasive ductal carcinoma, grade 2, with negative margins             (A) a single left axilla lymph node removed             (B) repeat prognostic panel finds the cancer estrogen  receptor positive, progesterone receptor and HER2 negative             (C) palpable lesion in the left breast consistent with evolving 0.5 oil cyst/fat necrosis by ultrasonography at [facility] on 07/20/2021   (5) adjuvant chemotherapy with cyclophosphamide, methotrexate and fluorouracil (CMF) started 05/17/2021, repeated every 21 days times 8, last dose 10/11/2021   (6) started tamoxifen 10/31/2021 discontinued 02/28/2023  METASTATIC DISEASE: Hospitalized for one week beginning 4/18 with malignant pleural effusion, thoracentesis noted metastatic breast cancer, ER+, PR-, HER-2 -.  CT chest abdomen pelvis scheduled for 03/22/2023,   CURRENT THERAPY: to start Faslodex  INTERVAL HISTORY: Rhaelynn Bruch Wilkowski 77 y.o. female returns for f/u after hospitalization from 02/16/2023 through 02/23/2023 that demonstrated right pleural effusion, + metastatic breast cancer, ER+, PR-, HER-2 - (0). She had pleurx placement during her hospitalization.  She is taking oxycodone 5 mg every 6 hours as needed for pain and this is helping keep her pain at bay.  T She was wondering if we can do anything on top of oxycodone. She is very constipated, she had to use mg citrate. She drained about 175 cc of pleural fluid of Saturday and 550 cc on Monday. No other complaints today for me.  Rest of the pertinent 10 point ROS reviewed and negative   Patient Active Problem List   Diagnosis Date Noted   Breast carcinoma metastatic to multiple sites, right (HCC) 02/18/2023   Pleural effusion on right 02/18/2023   Pleural effusion 02/17/2023   History of breast cancer 02/17/2023   Malnutrition of  moderate degree (HCC) 02/17/2023   Acute respiratory failure with hypoxia (HCC) 02/16/2023   Seasonal affective disorder (HCC) 10/14/2022   Essential hypertension 10/14/2022   Type 2 diabetes mellitus with hyperglycemia, without long-term current use of insulin (HCC) 10/14/2022   Dysphagia    Gastroesophageal reflux disease    Genetic  testing 12/29/2020   Family history of breast cancer 12/16/2020   Family history of gastric cancer 12/16/2020   Malignant neoplasm of upper-outer quadrant of left breast in female, estrogen receptor positive (HCC) 12/14/2020   DNR (do not resuscitate) 12/20/2019   Hyperlipidemia 11/12/2018   Stress incontinence 10/12/2018   Other fatigue 05/19/2017   Dyslipidemia 03/08/2017   History of peptic ulcer 03/06/2017   History of hypothyroidism 03/06/2017   Fibromyalgia 09/06/2016   Primary osteoarthritis of both knees 09/06/2016   Primary osteoarthritis of both hands 09/06/2016   DJD (degenerative joint disease), cervical 09/06/2016   Peptic ulcer disease 09/06/2016   Hypothyroidism 09/06/2016    is allergic to codeine and morphine and related.  MEDICAL HISTORY: Past Medical History:  Diagnosis Date   Anxiety    Breast cancer (HCC) 01/2021   left breast IDC   Complication of anesthesia    Family history of breast cancer 12/16/2020   Family history of gastric cancer 12/16/2020   Fibromyalgia    GERD (gastroesophageal reflux disease)    with hiatal hernia   Hyperlipidemia    Hypothyroidism    Osteoarthritis    knees, hands, fingers   PONV (postoperative nausea and vomiting)    Port-A-Cath in place 05/26/2021   Skin cancer     SURGICAL HISTORY: Past Surgical History:  Procedure Laterality Date   ABDOMINAL HYSTERECTOMY     CHEST TUBE INSERTION N/A 02/22/2023   Procedure: INSERTION PLEURAL DRAINAGE CATHETER;  Surgeon: Martina Sinner, MD;  Location: [facility];  Service: Pulmonary;  Laterality: N/A;   COLONOSCOPY  11/24/2015   ESOPHAGEAL MANOMETRY N/A 07/20/2022   Procedure: ESOPHAGEAL MANOMETRY (EM);  Surgeon: Shellia Cleverly, DO;  Location: [facility] ENDOSCOPY;  Service: Gastroenterology;  Laterality: N/A;   EVACUATION BREAST HEMATOMA Left 04/07/2021   Procedure: EVACUATION HEMATOMA BREAST;  Surgeon: Emelia Loron, MD;  Location: [facility];  Service:  General;  Laterality: Left;   LIVER SURGERY     Removed appendix and gall bladder   PORTA CATH INSERTION Right 04/07/2021   Procedure: PORTA CATH INSERTION;  Surgeon: Emelia Loron, MD;  Location: [facility];  Service: General;  Laterality: Right;   SIMPLE MASTECTOMY WITH AXILLARY SENTINEL NODE BIOPSY Left 04/07/2021   Procedure: LEFT SIMPLE MASTECTOMY WITH LEFT AXILLARY SENTINEL NODE BIOPSY;  Surgeon: Emelia Loron, MD;  Location: [facility];  Service: General;  Laterality: Left;   TONSILLECTOMY     TRIGGER FINGER RELEASE Right 03/03/2022    SOCIAL HISTORY: Social History   Socioeconomic History   Marital status: Widowed    Spouse name: Not on file   Number of children: Not on file   Years of education: Not on file   Highest education level: Not on file  Occupational History   Occupation: retired  Tobacco Use   Smoking status: Never   Smokeless tobacco: Never  Vaping Use   Vaping Use: Never used  Substance and Sexual Activity   Alcohol use: Yes    Comment: rarely    Drug use: Never   Sexual activity: Not Currently    Birth control/protection: Surgical  Other Topics Concern   Not on  file  Social History Narrative   Not on file   Social Determinants of Health   Financial Resource Strain: Medium Risk (01/30/2023)   Overall Financial Resource Strain (CARDIA)    Difficulty of Paying Living Expenses: Somewhat hard  Food Insecurity: No Food Insecurity (02/17/2023)   Hunger Vital Sign    Worried About Running Out of Food in the Last Year: Never true    Ran Out of Food in the Last Year: Never true  Transportation Needs: No Transportation Needs (02/17/2023)   PRAPARE - Administrator, Civil Service (Medical): No    Lack of Transportation (Non-Medical): No  Physical Activity: Sufficiently Active (01/28/2022)   Exercise Vital Sign    Days of Exercise per Week: 6 days    Minutes of Exercise per Session: 60 min  Stress: Stress  Concern Present (01/30/2023)   [employer] of Occupational Health - Occupational Stress Questionnaire    Feeling of Stress : To some extent  Social Connections: Moderately Integrated (01/30/2023)   Social Connection and Isolation Panel [NHANES]    Frequency of Communication with Friends and Family: Once a week    Frequency of Social Gatherings with Friends and Family: Twice a week    Attends Religious Services: More than 4 times per year    Active Member of [employer] or Organizations: No    Attends Banker Meetings: Never    Marital Status: Married  Catering manager Violence: Not At Risk (02/17/2023)   Humiliation, Afraid, Rape, and Kick questionnaire    Fear of Current or Ex-Partner: No    Emotionally Abused: No    Physically Abused: No    Sexually Abused: No    FAMILY HISTORY: Family History  Problem Relation Age of Onset   Hypertension Mother    Diabetes Mother    Arthritis Mother    Heart disease Mother    Rheum arthritis Father    Diabetes Father    Heart disease Father    Stomach cancer Brother 35   Stomach cancer Brother 62   Esophageal cancer Brother 36   Breast cancer Maternal Aunt 44   Breast cancer Maternal Aunt 72   Breast cancer Maternal Aunt 73   Colon cancer Neg Hx    Colon polyps Neg Hx    Pancreatic cancer Neg Hx     Review of Systems  Constitutional:  Positive for fatigue. Negative for appetite change, chills, fever and unexpected weight change.  HENT:   Negative for hearing loss, lump/mass, mouth sores and trouble swallowing.   Eyes:  Negative for eye problems and icterus.  Respiratory:  Negative for chest tightness, cough and shortness of breath.   Cardiovascular:  Negative for chest pain, leg swelling and palpitations.  Gastrointestinal:  Positive for abdominal pain and constipation. Negative for abdominal distention, diarrhea, nausea and vomiting.  Endocrine: Negative for hot flashes.  Genitourinary:  Negative for difficulty urinating.    Musculoskeletal:  Positive for back pain. Negative for arthralgias.  Skin:  Negative for itching and rash.  Neurological:  Negative for dizziness, extremity weakness, headaches and numbness.  Hematological:  Negative for adenopathy. Does not bruise/bleed easily.  Psychiatric/Behavioral:  Negative for depression. The patient is not nervous/anxious.       PHYSICAL EXAMINATION     Vitals:   03/07/23 1550  BP: 130/63  Pulse: 83  Resp: 17  Temp: 98.1 F (36.7 C)  SpO2: 96%      LABORATORY DATA:  Will add on lab testing  today.     ASSESSMENT and THERAPY PLAN:   Breast carcinoma metastatic to multiple sites, right [facility]) Meggan is a 77 year old woman with newly metastatic estrogen positive breast cancer.     Metastatic ER weak to moderate staining 70% positive breast cancer: We discussed that this is not curative once again, intent of treatment is palliative.  She will be having palliative radiation given severe back pain.  She will continue Faslodex for now since her tumor has weak to moderate ER positive however after completion of palliative radiation, I might consider frontline Xeloda.  I have discussed briefly about these treatment options.  She is agreeable to what ever we recommend.  She would like to try some treatment and if she does not have quality of life, she may consider palliative care. right pleural effusion: She has Pleurx placed and it is draining about 200-250 ml daily.  She is tolerating this well and her shortness of breath is managed with this approach. Cancer related pain: She is now on oxycodone.  She will also undergo palliative radiation which may alleviate the pain.   Constipation: Use senna docusate daily and mag citrate every third day if she does not have a bowel movement.   All questions were answered. The patient knows to call the clinic with any problems, questions or concerns. We can certainly see the patient much sooner if necessary.  Total  encounter time:30 minutes*in face-to-face visit time, chart review, lab review, care coordination, order entry, and documentation of the encounter time.    Lillard Anes, NP 03/07/23 4:07 PM Medical Oncology and Hematology [facility] [address] [place], [place] [address] Tel. [phone]    Fax. [phone]  *Total Encounter Time as defined by the Centers for Medicare and Medicaid Services includes, in addition to the face-to-face time of a patient visit (documented in the note above) non-face-to-face time: obtaining and reviewing outside history, ordering and reviewing medications, tests or procedures, care coordination (communications with other health care professionals or caregivers) and documentation in the medical record.

## 2023-03-08 ENCOUNTER — Ambulatory Visit
Admission: RE | Admit: 2023-03-08 | Discharge: 2023-03-08 | Disposition: A | Discharge status: Home / Self Care | Payer: Medicare PPO | Source: Ambulatory Visit | Attending: Radiation Oncology | Admitting: Radiation Oncology

## 2023-03-08 ENCOUNTER — Encounter: Payer: Self-pay | Admitting: Family Medicine

## 2023-03-08 DIAGNOSIS — Z170 Estrogen receptor positive status [ER+]: Secondary | ICD-10-CM | POA: Diagnosis not present

## 2023-03-08 DIAGNOSIS — C799 Secondary malignant neoplasm of unspecified site: Secondary | ICD-10-CM | POA: Diagnosis not present

## 2023-03-08 DIAGNOSIS — J942 Hemothorax: Secondary | ICD-10-CM | POA: Diagnosis not present

## 2023-03-08 DIAGNOSIS — Z4803 Encounter for change or removal of drains: Secondary | ICD-10-CM | POA: Diagnosis not present

## 2023-03-08 DIAGNOSIS — Z510 Encounter for antineoplastic radiation therapy: Secondary | ICD-10-CM | POA: Diagnosis not present

## 2023-03-08 DIAGNOSIS — C7800 Secondary malignant neoplasm of unspecified lung: Secondary | ICD-10-CM | POA: Diagnosis not present

## 2023-03-08 DIAGNOSIS — C50911 Malignant neoplasm of unspecified site of right female breast: Secondary | ICD-10-CM | POA: Diagnosis not present

## 2023-03-08 DIAGNOSIS — Z48813 Encounter for surgical aftercare following surgery on the respiratory system: Secondary | ICD-10-CM | POA: Diagnosis not present

## 2023-03-08 DIAGNOSIS — J90 Pleural effusion, not elsewhere classified: Secondary | ICD-10-CM | POA: Diagnosis not present

## 2023-03-08 DIAGNOSIS — C50411 Malignant neoplasm of upper-outer quadrant of right female breast: Secondary | ICD-10-CM | POA: Diagnosis not present

## 2023-03-08 DIAGNOSIS — Z5111 Encounter for antineoplastic chemotherapy: Secondary | ICD-10-CM | POA: Diagnosis not present

## 2023-03-08 DIAGNOSIS — J9601 Acute respiratory failure with hypoxia: Secondary | ICD-10-CM | POA: Diagnosis not present

## 2023-03-08 DIAGNOSIS — C50412 Malignant neoplasm of upper-outer quadrant of left female breast: Secondary | ICD-10-CM | POA: Diagnosis not present

## 2023-03-08 DIAGNOSIS — Z4801 Encounter for change or removal of surgical wound dressing: Secondary | ICD-10-CM | POA: Diagnosis not present

## 2023-03-08 DIAGNOSIS — C7951 Secondary malignant neoplasm of bone: Secondary | ICD-10-CM | POA: Diagnosis not present

## 2023-03-08 DIAGNOSIS — E1165 Type 2 diabetes mellitus with hyperglycemia: Secondary | ICD-10-CM | POA: Diagnosis not present

## 2023-03-09 ENCOUNTER — Other Ambulatory Visit: Payer: Self-pay

## 2023-03-09 ENCOUNTER — Telehealth: Payer: Self-pay | Admitting: *Deleted

## 2023-03-09 ENCOUNTER — Ambulatory Visit
Admission: RE | Admit: 2023-03-09 | Discharge: 2023-03-09 | Disposition: A | Discharge status: Home / Self Care | Payer: Medicare PPO | Source: Ambulatory Visit | Attending: Radiation Oncology | Admitting: Radiation Oncology

## 2023-03-09 ENCOUNTER — Other Ambulatory Visit: Payer: Self-pay | Admitting: *Deleted

## 2023-03-09 ENCOUNTER — Encounter: Payer: Self-pay | Admitting: Adult Health

## 2023-03-09 DIAGNOSIS — Z170 Estrogen receptor positive status [ER+]: Secondary | ICD-10-CM | POA: Diagnosis not present

## 2023-03-09 DIAGNOSIS — Z510 Encounter for antineoplastic radiation therapy: Secondary | ICD-10-CM | POA: Diagnosis not present

## 2023-03-09 DIAGNOSIS — C7951 Secondary malignant neoplasm of bone: Secondary | ICD-10-CM | POA: Diagnosis not present

## 2023-03-09 DIAGNOSIS — C50412 Malignant neoplasm of upper-outer quadrant of left female breast: Secondary | ICD-10-CM | POA: Diagnosis not present

## 2023-03-09 DIAGNOSIS — C7800 Secondary malignant neoplasm of unspecified lung: Secondary | ICD-10-CM | POA: Diagnosis not present

## 2023-03-09 DIAGNOSIS — Z5111 Encounter for antineoplastic chemotherapy: Secondary | ICD-10-CM | POA: Diagnosis not present

## 2023-03-09 DIAGNOSIS — C50411 Malignant neoplasm of upper-outer quadrant of right female breast: Secondary | ICD-10-CM | POA: Diagnosis not present

## 2023-03-09 LAB — RAD ONC ARIA SESSION SUMMARY
Course Elapsed Days: 0
Plan Fractions Treated to Date: 1
Plan Prescribed Dose Per Fraction: 3 Gy
Plan Total Fractions Prescribed: 10
Plan Total Prescribed Dose: 30 Gy
Reference Point Dosage Given to Date: 3 Gy
Reference Point Session Dosage Given: 3 Gy
Session Number: 1

## 2023-03-09 MED ORDER — PROCHLORPERAZINE MALEATE 5 MG PO TABS: 5.0000 mg | ORAL_TABLET | Freq: Two times a day (BID) | ORAL | 1 refills | Status: DC | PRN

## 2023-03-09 MED ORDER — LORAZEPAM 0.5 MG PO TABS: ORAL_TABLET | ORAL | 1 refills | Status: DC

## 2023-03-09 NOTE — Telephone Encounter (Signed)
This RN spoke with pt per her call stating onset of nausea with minimal vomiting ( 2x yesterday 1x today).  She is able to hydrate well despite above.  Per review with MD - prescriptions for compazine 5 mg and Ativan 0.5 mg sent to pharmacy.  Called pt back to discuss use- obtained her identified VM- message left regarding use of medications.

## 2023-03-10 ENCOUNTER — Ambulatory Visit
Admission: RE | Admit: 2023-03-10 | Discharge: 2023-03-10 | Disposition: A | Discharge status: Home / Self Care | Payer: Medicare PPO | Source: Ambulatory Visit | Attending: Radiation Oncology | Admitting: Radiation Oncology

## 2023-03-10 ENCOUNTER — Other Ambulatory Visit: Payer: Self-pay

## 2023-03-10 DIAGNOSIS — Z4803 Encounter for change or removal of drains: Secondary | ICD-10-CM | POA: Diagnosis not present

## 2023-03-10 DIAGNOSIS — J9601 Acute respiratory failure with hypoxia: Secondary | ICD-10-CM | POA: Diagnosis not present

## 2023-03-10 DIAGNOSIS — C50911 Malignant neoplasm of unspecified site of right female breast: Secondary | ICD-10-CM | POA: Diagnosis not present

## 2023-03-10 DIAGNOSIS — C50412 Malignant neoplasm of upper-outer quadrant of left female breast: Secondary | ICD-10-CM | POA: Diagnosis not present

## 2023-03-10 DIAGNOSIS — Z5111 Encounter for antineoplastic chemotherapy: Secondary | ICD-10-CM | POA: Diagnosis not present

## 2023-03-10 DIAGNOSIS — Z510 Encounter for antineoplastic radiation therapy: Secondary | ICD-10-CM | POA: Diagnosis not present

## 2023-03-10 DIAGNOSIS — J942 Hemothorax: Secondary | ICD-10-CM | POA: Diagnosis not present

## 2023-03-10 DIAGNOSIS — Z48813 Encounter for surgical aftercare following surgery on the respiratory system: Secondary | ICD-10-CM | POA: Diagnosis not present

## 2023-03-10 DIAGNOSIS — J90 Pleural effusion, not elsewhere classified: Secondary | ICD-10-CM | POA: Diagnosis not present

## 2023-03-10 DIAGNOSIS — C7951 Secondary malignant neoplasm of bone: Secondary | ICD-10-CM | POA: Diagnosis not present

## 2023-03-10 DIAGNOSIS — Z170 Estrogen receptor positive status [ER+]: Secondary | ICD-10-CM | POA: Diagnosis not present

## 2023-03-10 DIAGNOSIS — C7800 Secondary malignant neoplasm of unspecified lung: Secondary | ICD-10-CM | POA: Diagnosis not present

## 2023-03-10 DIAGNOSIS — C799 Secondary malignant neoplasm of unspecified site: Secondary | ICD-10-CM | POA: Diagnosis not present

## 2023-03-10 DIAGNOSIS — E1165 Type 2 diabetes mellitus with hyperglycemia: Secondary | ICD-10-CM | POA: Diagnosis not present

## 2023-03-10 DIAGNOSIS — Z4801 Encounter for change or removal of surgical wound dressing: Secondary | ICD-10-CM | POA: Diagnosis not present

## 2023-03-10 DIAGNOSIS — C50411 Malignant neoplasm of upper-outer quadrant of right female breast: Secondary | ICD-10-CM | POA: Diagnosis not present

## 2023-03-10 LAB — RAD ONC ARIA SESSION SUMMARY
Course Elapsed Days: 1
Plan Fractions Treated to Date: 2
Plan Prescribed Dose Per Fraction: 3 Gy
Plan Total Fractions Prescribed: 10
Plan Total Prescribed Dose: 30 Gy
Reference Point Dosage Given to Date: 6 Gy
Reference Point Session Dosage Given: 3 Gy
Session Number: 2

## 2023-03-13 ENCOUNTER — Ambulatory Visit
Admission: RE | Admit: 2023-03-13 | Discharge: 2023-03-13 | Disposition: A | Discharge status: Home / Self Care | Payer: Medicare PPO | Source: Ambulatory Visit | Attending: Radiation Oncology | Admitting: Radiation Oncology

## 2023-03-13 ENCOUNTER — Other Ambulatory Visit: Payer: Self-pay

## 2023-03-13 DIAGNOSIS — Z510 Encounter for antineoplastic radiation therapy: Secondary | ICD-10-CM | POA: Diagnosis not present

## 2023-03-13 DIAGNOSIS — Z170 Estrogen receptor positive status [ER+]: Secondary | ICD-10-CM | POA: Diagnosis not present

## 2023-03-13 DIAGNOSIS — C7800 Secondary malignant neoplasm of unspecified lung: Secondary | ICD-10-CM | POA: Diagnosis not present

## 2023-03-13 DIAGNOSIS — C50411 Malignant neoplasm of upper-outer quadrant of right female breast: Secondary | ICD-10-CM | POA: Diagnosis not present

## 2023-03-13 DIAGNOSIS — Z5111 Encounter for antineoplastic chemotherapy: Secondary | ICD-10-CM | POA: Diagnosis not present

## 2023-03-13 DIAGNOSIS — C50412 Malignant neoplasm of upper-outer quadrant of left female breast: Secondary | ICD-10-CM | POA: Diagnosis not present

## 2023-03-13 DIAGNOSIS — C7951 Secondary malignant neoplasm of bone: Secondary | ICD-10-CM | POA: Diagnosis not present

## 2023-03-13 LAB — RAD ONC ARIA SESSION SUMMARY
Course Elapsed Days: 4
Plan Fractions Treated to Date: 3
Plan Prescribed Dose Per Fraction: 3 Gy
Plan Total Fractions Prescribed: 10
Plan Total Prescribed Dose: 30 Gy
Reference Point Dosage Given to Date: 9 Gy
Reference Point Session Dosage Given: 3 Gy
Session Number: 3

## 2023-03-14 ENCOUNTER — Ambulatory Visit
Admission: RE | Admit: 2023-03-14 | Discharge: 2023-03-14 | Disposition: A | Discharge status: Home / Self Care | Payer: Medicare PPO | Source: Ambulatory Visit | Attending: Radiation Oncology | Admitting: Radiation Oncology

## 2023-03-14 ENCOUNTER — Other Ambulatory Visit: Payer: Self-pay

## 2023-03-14 DIAGNOSIS — Z170 Estrogen receptor positive status [ER+]: Secondary | ICD-10-CM | POA: Diagnosis not present

## 2023-03-14 DIAGNOSIS — Z5111 Encounter for antineoplastic chemotherapy: Secondary | ICD-10-CM | POA: Diagnosis not present

## 2023-03-14 DIAGNOSIS — Z510 Encounter for antineoplastic radiation therapy: Secondary | ICD-10-CM | POA: Diagnosis not present

## 2023-03-14 DIAGNOSIS — C7800 Secondary malignant neoplasm of unspecified lung: Secondary | ICD-10-CM | POA: Diagnosis not present

## 2023-03-14 DIAGNOSIS — C50411 Malignant neoplasm of upper-outer quadrant of right female breast: Secondary | ICD-10-CM | POA: Diagnosis not present

## 2023-03-14 DIAGNOSIS — C7951 Secondary malignant neoplasm of bone: Secondary | ICD-10-CM | POA: Diagnosis not present

## 2023-03-14 DIAGNOSIS — C50412 Malignant neoplasm of upper-outer quadrant of left female breast: Secondary | ICD-10-CM | POA: Diagnosis not present

## 2023-03-14 LAB — RAD ONC ARIA SESSION SUMMARY
Course Elapsed Days: 5
Plan Fractions Treated to Date: 4
Plan Prescribed Dose Per Fraction: 3 Gy
Plan Total Fractions Prescribed: 10
Plan Total Prescribed Dose: 30 Gy
Reference Point Dosage Given to Date: 12 Gy
Reference Point Session Dosage Given: 3 Gy
Session Number: 4

## 2023-03-15 ENCOUNTER — Other Ambulatory Visit: Payer: Self-pay

## 2023-03-15 ENCOUNTER — Telehealth: Payer: Self-pay | Admitting: Pulmonary Disease

## 2023-03-15 ENCOUNTER — Ambulatory Visit
Admission: RE | Admit: 2023-03-15 | Discharge: 2023-03-15 | Disposition: A | Discharge status: Home / Self Care | Payer: Medicare PPO | Source: Ambulatory Visit | Attending: Radiation Oncology | Admitting: Radiation Oncology

## 2023-03-15 DIAGNOSIS — J948 Other specified pleural conditions: Secondary | ICD-10-CM

## 2023-03-15 DIAGNOSIS — C7800 Secondary malignant neoplasm of unspecified lung: Secondary | ICD-10-CM | POA: Diagnosis not present

## 2023-03-15 DIAGNOSIS — Z170 Estrogen receptor positive status [ER+]: Secondary | ICD-10-CM | POA: Diagnosis not present

## 2023-03-15 DIAGNOSIS — Z510 Encounter for antineoplastic radiation therapy: Secondary | ICD-10-CM | POA: Diagnosis not present

## 2023-03-15 DIAGNOSIS — Z5111 Encounter for antineoplastic chemotherapy: Secondary | ICD-10-CM | POA: Diagnosis not present

## 2023-03-15 DIAGNOSIS — C50412 Malignant neoplasm of upper-outer quadrant of left female breast: Secondary | ICD-10-CM | POA: Diagnosis not present

## 2023-03-15 DIAGNOSIS — K219 Gastro-esophageal reflux disease without esophagitis: Secondary | ICD-10-CM

## 2023-03-15 DIAGNOSIS — C50411 Malignant neoplasm of upper-outer quadrant of right female breast: Secondary | ICD-10-CM | POA: Diagnosis not present

## 2023-03-15 DIAGNOSIS — C7951 Secondary malignant neoplasm of bone: Secondary | ICD-10-CM | POA: Diagnosis not present

## 2023-03-15 LAB — RAD ONC ARIA SESSION SUMMARY
Course Elapsed Days: 6
Plan Fractions Treated to Date: 5
Plan Prescribed Dose Per Fraction: 3 Gy
Plan Total Fractions Prescribed: 10
Plan Total Prescribed Dose: 30 Gy
Reference Point Dosage Given to Date: 15 Gy
Reference Point Session Dosage Given: 3 Gy
Session Number: 5

## 2023-03-15 NOTE — Telephone Encounter (Signed)
PT calling for more Antibx. Dr. Francine Graven told her to request more if we though she needed it.  The drainage port is turning red and there was some blood on the por. Home Health Care nurses felt she needed more Antibx as well. Last one taken yesterday/ [phone] is her # . Pharm closes at 7:00 if we can address ASAP./   Pharm is [facility] in [place]

## 2023-03-16 ENCOUNTER — Other Ambulatory Visit: Payer: Self-pay

## 2023-03-16 ENCOUNTER — Ambulatory Visit
Admission: RE | Admit: 2023-03-16 | Discharge: 2023-03-16 | Disposition: A | Discharge status: Home / Self Care | Payer: Medicare PPO | Source: Ambulatory Visit | Attending: Radiation Oncology | Admitting: Radiation Oncology

## 2023-03-16 DIAGNOSIS — Z170 Estrogen receptor positive status [ER+]: Secondary | ICD-10-CM | POA: Diagnosis not present

## 2023-03-16 DIAGNOSIS — Z510 Encounter for antineoplastic radiation therapy: Secondary | ICD-10-CM | POA: Diagnosis not present

## 2023-03-16 DIAGNOSIS — Z4801 Encounter for change or removal of surgical wound dressing: Secondary | ICD-10-CM | POA: Diagnosis not present

## 2023-03-16 DIAGNOSIS — E1165 Type 2 diabetes mellitus with hyperglycemia: Secondary | ICD-10-CM | POA: Diagnosis not present

## 2023-03-16 DIAGNOSIS — C7800 Secondary malignant neoplasm of unspecified lung: Secondary | ICD-10-CM | POA: Diagnosis not present

## 2023-03-16 DIAGNOSIS — C50412 Malignant neoplasm of upper-outer quadrant of left female breast: Secondary | ICD-10-CM | POA: Diagnosis not present

## 2023-03-16 DIAGNOSIS — C50411 Malignant neoplasm of upper-outer quadrant of right female breast: Secondary | ICD-10-CM | POA: Diagnosis not present

## 2023-03-16 DIAGNOSIS — J9601 Acute respiratory failure with hypoxia: Secondary | ICD-10-CM | POA: Diagnosis not present

## 2023-03-16 DIAGNOSIS — Z5111 Encounter for antineoplastic chemotherapy: Secondary | ICD-10-CM | POA: Diagnosis not present

## 2023-03-16 DIAGNOSIS — Z4803 Encounter for change or removal of drains: Secondary | ICD-10-CM | POA: Diagnosis not present

## 2023-03-16 DIAGNOSIS — J942 Hemothorax: Secondary | ICD-10-CM | POA: Diagnosis not present

## 2023-03-16 DIAGNOSIS — J90 Pleural effusion, not elsewhere classified: Secondary | ICD-10-CM | POA: Diagnosis not present

## 2023-03-16 DIAGNOSIS — C50911 Malignant neoplasm of unspecified site of right female breast: Secondary | ICD-10-CM | POA: Diagnosis not present

## 2023-03-16 DIAGNOSIS — Z48813 Encounter for surgical aftercare following surgery on the respiratory system: Secondary | ICD-10-CM | POA: Diagnosis not present

## 2023-03-16 DIAGNOSIS — C7951 Secondary malignant neoplasm of bone: Secondary | ICD-10-CM | POA: Diagnosis not present

## 2023-03-16 DIAGNOSIS — C799 Secondary malignant neoplasm of unspecified site: Secondary | ICD-10-CM | POA: Diagnosis not present

## 2023-03-16 LAB — RAD ONC ARIA SESSION SUMMARY
Course Elapsed Days: 7
Plan Fractions Treated to Date: 6
Plan Prescribed Dose Per Fraction: 3 Gy
Plan Total Fractions Prescribed: 10
Plan Total Prescribed Dose: 30 Gy
Reference Point Dosage Given to Date: 18 Gy
Reference Point Session Dosage Given: 3 Gy
Session Number: 6

## 2023-03-16 MED ORDER — AMOXICILLIN-POT CLAVULANATE 875-125 MG PO TABS
1.0000 | ORAL_TABLET | Freq: Two times a day (BID) | ORAL | 0 refills | Status: DC
Start: 2023-03-16 — End: 2023-04-18

## 2023-03-16 MED ORDER — FAMOTIDINE 20 MG PO TABS
20.0000 mg | ORAL_TABLET | Freq: Every day | ORAL | 5 refills | Status: DC
Start: 2023-03-16 — End: 2023-04-18

## 2023-03-16 NOTE — Telephone Encounter (Signed)
I examined patient after her radiation therapy treatment. There is redness surrounding the pleurX drainage catheter. No purulent drainage.   Will send in augmentin 875mg  tablets, 1 tab BID for 7 days  She is to drain the pleurX daily given increased output recently and increased dyspnea with fluid build up.  She is also complaining of significant reflux. She is to continue esomeprazole daily and I will start her on pepcid 20mg  at bedtime.  She is to call the office as needed.  Melody Comas, MD [facility] Office: [phone]   See Amion for personal pager PCCM on call pager [phone] until 7pm. Please call Elink 7p-7a. [phone]

## 2023-03-16 NOTE — Addendum Note (Signed)
Addended by: Melody Comas on: 03/16/2023 05:24 PM   Modules accepted: Orders

## 2023-03-16 NOTE — Telephone Encounter (Signed)
Please let patient know that I am working at [facility] today. The radiation oncology nurse will let me know when she checks in for her treatment and I will come to the cancer center to evaluate her pleurX.  Thanks, JD

## 2023-03-16 NOTE — Telephone Encounter (Signed)
Called and spoke with the pt  She states her breathing seems worse the past few days  She drained 525 cc of bloody fluid yesterday  She says that there is soreness around pleurx site  She finished keflex 2 days ago  She wonders if she needs more abx  I offered acute visit with APP this morning and she refused  She has multiple appts today and does not feel she can make it here too  She was tearful on the phone stating she is not sure what to expect at this point  Dr Francine Graven, please advise, thanks!  Allergies  Allergen Reactions   Codeine Other (See Comments)    Hallucination  Other reaction(s): Respiratory Distress   Morphine And Codeine Other (See Comments)    Knocks me out per pt

## 2023-03-16 NOTE — Telephone Encounter (Signed)
Called pt and there was no answer- Since she is active in Mychart I have sent her msg letting her know of Dr Lanora Manis response.

## 2023-03-17 ENCOUNTER — Other Ambulatory Visit: Payer: Self-pay | Admitting: Radiation Oncology

## 2023-03-17 ENCOUNTER — Ambulatory Visit
Admission: RE | Admit: 2023-03-17 | Discharge: 2023-03-17 | Disposition: A | Discharge status: Home / Self Care | Payer: Medicare PPO | Source: Ambulatory Visit | Attending: Radiation Oncology | Admitting: Radiation Oncology

## 2023-03-17 ENCOUNTER — Other Ambulatory Visit: Payer: Self-pay

## 2023-03-17 DIAGNOSIS — C7951 Secondary malignant neoplasm of bone: Secondary | ICD-10-CM | POA: Diagnosis not present

## 2023-03-17 DIAGNOSIS — Z5111 Encounter for antineoplastic chemotherapy: Secondary | ICD-10-CM | POA: Diagnosis not present

## 2023-03-17 DIAGNOSIS — C50411 Malignant neoplasm of upper-outer quadrant of right female breast: Secondary | ICD-10-CM | POA: Diagnosis not present

## 2023-03-17 DIAGNOSIS — Z510 Encounter for antineoplastic radiation therapy: Secondary | ICD-10-CM | POA: Diagnosis not present

## 2023-03-17 DIAGNOSIS — C50412 Malignant neoplasm of upper-outer quadrant of left female breast: Secondary | ICD-10-CM | POA: Diagnosis not present

## 2023-03-17 DIAGNOSIS — C7800 Secondary malignant neoplasm of unspecified lung: Secondary | ICD-10-CM | POA: Diagnosis not present

## 2023-03-17 DIAGNOSIS — Z170 Estrogen receptor positive status [ER+]: Secondary | ICD-10-CM | POA: Diagnosis not present

## 2023-03-17 LAB — RAD ONC ARIA SESSION SUMMARY
Course Elapsed Days: 8
Plan Fractions Treated to Date: 7
Plan Prescribed Dose Per Fraction: 3 Gy
Plan Total Fractions Prescribed: 10
Plan Total Prescribed Dose: 30 Gy
Reference Point Dosage Given to Date: 21 Gy
Reference Point Session Dosage Given: 3 Gy
Session Number: 7

## 2023-03-17 MED ORDER — SUCRALFATE 1 G PO TABS: 1.0000 g | ORAL_TABLET | Freq: Four times a day (QID) | ORAL | 1 refills | Status: DC

## 2023-03-20 ENCOUNTER — Other Ambulatory Visit: Payer: Self-pay | Admitting: Radiology

## 2023-03-20 ENCOUNTER — Other Ambulatory Visit: Payer: Self-pay

## 2023-03-20 ENCOUNTER — Ambulatory Visit
Admission: RE | Admit: 2023-03-20 | Discharge: 2023-03-20 | Disposition: A | Discharge status: Home / Self Care | Payer: Medicare PPO | Source: Ambulatory Visit | Attending: Radiation Oncology | Admitting: Radiation Oncology

## 2023-03-20 DIAGNOSIS — C7951 Secondary malignant neoplasm of bone: Secondary | ICD-10-CM | POA: Diagnosis not present

## 2023-03-20 DIAGNOSIS — B370 Candidal stomatitis: Secondary | ICD-10-CM

## 2023-03-20 DIAGNOSIS — Z5111 Encounter for antineoplastic chemotherapy: Secondary | ICD-10-CM | POA: Diagnosis not present

## 2023-03-20 DIAGNOSIS — C50411 Malignant neoplasm of upper-outer quadrant of right female breast: Secondary | ICD-10-CM | POA: Diagnosis not present

## 2023-03-20 DIAGNOSIS — C7800 Secondary malignant neoplasm of unspecified lung: Secondary | ICD-10-CM | POA: Diagnosis not present

## 2023-03-20 DIAGNOSIS — Z170 Estrogen receptor positive status [ER+]: Secondary | ICD-10-CM | POA: Diagnosis not present

## 2023-03-20 DIAGNOSIS — Z510 Encounter for antineoplastic radiation therapy: Secondary | ICD-10-CM | POA: Diagnosis not present

## 2023-03-20 DIAGNOSIS — C50412 Malignant neoplasm of upper-outer quadrant of left female breast: Secondary | ICD-10-CM | POA: Diagnosis not present

## 2023-03-20 LAB — RAD ONC ARIA SESSION SUMMARY
Course Elapsed Days: 11
Plan Fractions Treated to Date: 8
Plan Prescribed Dose Per Fraction: 3 Gy
Plan Total Fractions Prescribed: 10
Plan Total Prescribed Dose: 30 Gy
Reference Point Dosage Given to Date: 24 Gy
Reference Point Session Dosage Given: 3 Gy
Session Number: 8

## 2023-03-20 MED ORDER — FLUCONAZOLE 100 MG PO TABS
100.0000 mg | ORAL_TABLET | Freq: Every day | ORAL | 0 refills | Status: DC
Start: 2023-03-20 — End: 2023-03-20

## 2023-03-20 MED ORDER — FLUCONAZOLE 100 MG PO TABS
100.0000 mg | ORAL_TABLET | Freq: Every day | ORAL | 0 refills | Status: DC
Start: 2023-03-20 — End: 2023-04-18

## 2023-03-20 NOTE — Progress Notes (Signed)
Patient is receiving radiation treatment to her T6 spine. She received 8/10 treatments today. She is complaining of burning in her mouth and not being able to taste anything for the past couple of days. White patches on the surface of the oral mucosa were present on physical exam. Patient is currently on Decadron 4 mg daily. Findings are consistent with thrush. I prescribed 10 days of fluconazole for treatment. Patient was instructed to stop taking her statin during this course of treatment. She expressed understanding. She understands to let us know if the symptoms do not improve or worsen.     Joyice Faster, PA-C

## 2023-03-21 ENCOUNTER — Other Ambulatory Visit: Payer: Self-pay

## 2023-03-21 ENCOUNTER — Telehealth: Payer: Self-pay | Admitting: *Deleted

## 2023-03-21 ENCOUNTER — Inpatient Hospital Stay: Payer: Medicare PPO

## 2023-03-21 ENCOUNTER — Encounter: Payer: Self-pay | Admitting: Radiation Oncology

## 2023-03-21 ENCOUNTER — Ambulatory Visit
Admission: RE | Admit: 2023-03-21 | Discharge: 2023-03-21 | Disposition: A | Discharge status: Home / Self Care | Payer: Medicare PPO | Source: Ambulatory Visit | Attending: Radiation Oncology | Admitting: Radiation Oncology

## 2023-03-21 VITALS — BP 102/69 | HR 88 | Temp 97.8°F | Resp 16

## 2023-03-21 DIAGNOSIS — Z170 Estrogen receptor positive status [ER+]: Secondary | ICD-10-CM

## 2023-03-21 DIAGNOSIS — Z510 Encounter for antineoplastic radiation therapy: Secondary | ICD-10-CM | POA: Diagnosis not present

## 2023-03-21 DIAGNOSIS — Z5111 Encounter for antineoplastic chemotherapy: Secondary | ICD-10-CM | POA: Diagnosis not present

## 2023-03-21 DIAGNOSIS — C7951 Secondary malignant neoplasm of bone: Secondary | ICD-10-CM | POA: Diagnosis not present

## 2023-03-21 DIAGNOSIS — C50911 Malignant neoplasm of unspecified site of right female breast: Secondary | ICD-10-CM

## 2023-03-21 DIAGNOSIS — C50412 Malignant neoplasm of upper-outer quadrant of left female breast: Secondary | ICD-10-CM | POA: Diagnosis not present

## 2023-03-21 DIAGNOSIS — C50411 Malignant neoplasm of upper-outer quadrant of right female breast: Secondary | ICD-10-CM | POA: Diagnosis not present

## 2023-03-21 DIAGNOSIS — C7800 Secondary malignant neoplasm of unspecified lung: Secondary | ICD-10-CM | POA: Diagnosis not present

## 2023-03-21 DIAGNOSIS — G893 Neoplasm related pain (acute) (chronic): Secondary | ICD-10-CM

## 2023-03-21 LAB — CMP (CANCER CENTER ONLY)
ALT: 12 U/L (ref 0–44)
AST: 21 U/L (ref 15–41)
Albumin: 3.2 g/dL — ABNORMAL LOW (ref 3.5–5.0)
Alkaline Phosphatase: 65 U/L (ref 38–126)
Anion gap: 9 (ref 5–15)
BUN: 13 mg/dL (ref 8–23)
CO2: 27 mmol/L (ref 22–32)
Calcium: 8.1 mg/dL — ABNORMAL LOW (ref 8.9–10.3)
Chloride: 98 mmol/L (ref 98–111)
Creatinine: 0.68 mg/dL (ref 0.44–1.00)
GFR, Estimated: >60 mL/min (ref >60)
Glucose, Bld: 195 mg/dL — ABNORMAL HIGH (ref 70–99)
Potassium: 3.8 mmol/L (ref 3.5–5.1)
Sodium: 134 mmol/L — ABNORMAL LOW (ref 135–145)
Total Bilirubin: 0.7 mg/dL (ref 0.3–1.2)
Total Protein: 6 g/dL — ABNORMAL LOW (ref 6.5–8.1)

## 2023-03-21 LAB — CBC WITH DIFFERENTIAL (CANCER CENTER ONLY)
Abs Immature Granulocytes: 0.06 10*3/uL (ref 0.00–0.07)
Basophils Absolute: 0 10*3/uL (ref 0.0–0.1)
Basophils Relative: 0 %
Eosinophils Absolute: 0 10*3/uL (ref 0.0–0.5)
Eosinophils Relative: 0 %
HCT: 44.8 % (ref 36.0–46.0)
Hemoglobin: 15 g/dL (ref 12.0–15.0)
Immature Granulocytes: 1 %
Lymphocytes Relative: 4 %
Lymphs Abs: 0.4 10*3/uL — ABNORMAL LOW (ref 0.7–4.0)
MCH: 31.1 pg (ref 26.0–34.0)
MCHC: 33.5 g/dL (ref 30.0–36.0)
MCV: 92.9 fL (ref 80.0–100.0)
Monocytes Absolute: 0.9 10*3/uL (ref 0.1–1.0)
Monocytes Relative: 9 %
Neutro Abs: 9 10*3/uL — ABNORMAL HIGH (ref 1.7–7.7)
Neutrophils Relative %: 86 %
Platelet Count: 245 10*3/uL (ref 150–400)
RBC: 4.82 MIL/uL (ref 3.87–5.11)
RDW: 13.6 % (ref 11.5–15.5)
WBC Count: 10.4 10*3/uL (ref 4.0–10.5)
nRBC: 0 % (ref 0.0–0.2)

## 2023-03-21 LAB — RAD ONC ARIA SESSION SUMMARY
Course Elapsed Days: 12
Plan Fractions Treated to Date: 9
Plan Prescribed Dose Per Fraction: 3 Gy
Plan Total Fractions Prescribed: 10
Plan Total Prescribed Dose: 30 Gy
Reference Point Dosage Given to Date: 27 Gy
Reference Point Session Dosage Given: 3 Gy
Session Number: 9

## 2023-03-21 MED ORDER — FULVESTRANT 250 MG/5ML IM SOSY
500.0000 mg | PREFILLED_SYRINGE | Freq: Once | INTRAMUSCULAR | Status: AC
  Administered 2023-03-21: 500 mg via INTRAMUSCULAR
  Filled 2023-03-21: qty 10

## 2023-03-21 NOTE — Telephone Encounter (Signed)
Returned call to the patient's niece.  I let her know that the patient should stop taking her statin while taking Diflucan.  She voiced that the patient stopped taking her steroid yesterday.  She was informed to have the patient restart her steroids and get in at least 2 doses today and start her normal three times a day tomorrow.  She was given taper instructions that will start on Friday this week.  She verbalized understanding.  Lind Covert RN, BSN

## 2023-03-21 NOTE — Progress Notes (Signed)
  Radiation Oncology         [phone] ________________________________  Name: Gina Hunt  ZOX:[id number]  Date of Service: 03/21/23  DOB: 17-Nov-1945   Steroid Taper Instructions   You currently have a prescription for Dexamethasone 4 mg Tablets.   Beginning 03/24/23  Take a 4 mg tablet twice a day  Beginning 03/31/23: Take 1/2 of a tablet (which is 2 mg) twice a day  Beginning 04/07/23: Take 1/2 of a tablet (which is 2 mg) once a day  Beginning 04/14/23: Take 1/2 of a tablet (which is 2 mg) every other day and stop on 04/19/23.   Please call our office if you have any extremity weakness, loss of control of bowel or bladder function, or changes in sensation of your body.

## 2023-03-22 ENCOUNTER — Ambulatory Visit
Admission: RE | Admit: 2023-03-22 | Discharge: 2023-03-22 | Disposition: A | Discharge status: Home / Self Care | Payer: Medicare PPO | Source: Ambulatory Visit | Attending: Radiation Oncology | Admitting: Radiation Oncology

## 2023-03-22 ENCOUNTER — Other Ambulatory Visit: Payer: Self-pay

## 2023-03-22 ENCOUNTER — Other Ambulatory Visit: Payer: Medicare HMO

## 2023-03-22 DIAGNOSIS — Z510 Encounter for antineoplastic radiation therapy: Secondary | ICD-10-CM | POA: Diagnosis not present

## 2023-03-22 DIAGNOSIS — C50412 Malignant neoplasm of upper-outer quadrant of left female breast: Secondary | ICD-10-CM | POA: Diagnosis not present

## 2023-03-22 DIAGNOSIS — Z5111 Encounter for antineoplastic chemotherapy: Secondary | ICD-10-CM | POA: Diagnosis not present

## 2023-03-22 DIAGNOSIS — C50411 Malignant neoplasm of upper-outer quadrant of right female breast: Secondary | ICD-10-CM | POA: Diagnosis not present

## 2023-03-22 DIAGNOSIS — Z170 Estrogen receptor positive status [ER+]: Secondary | ICD-10-CM | POA: Diagnosis not present

## 2023-03-22 DIAGNOSIS — C7951 Secondary malignant neoplasm of bone: Secondary | ICD-10-CM | POA: Diagnosis not present

## 2023-03-22 DIAGNOSIS — C7800 Secondary malignant neoplasm of unspecified lung: Secondary | ICD-10-CM | POA: Diagnosis not present

## 2023-03-22 LAB — RAD ONC ARIA SESSION SUMMARY
Course Elapsed Days: 13
Plan Fractions Treated to Date: 10
Plan Prescribed Dose Per Fraction: 3 Gy
Plan Total Fractions Prescribed: 10
Plan Total Prescribed Dose: 30 Gy
Reference Point Dosage Given to Date: 30 Gy
Reference Point Session Dosage Given: 3 Gy
Session Number: 10

## 2023-03-24 ENCOUNTER — Telehealth: Payer: Self-pay | Admitting: Pulmonary Disease

## 2023-03-24 DIAGNOSIS — E1165 Type 2 diabetes mellitus with hyperglycemia: Secondary | ICD-10-CM | POA: Diagnosis not present

## 2023-03-24 DIAGNOSIS — Z4801 Encounter for change or removal of surgical wound dressing: Secondary | ICD-10-CM | POA: Diagnosis not present

## 2023-03-24 DIAGNOSIS — Z48813 Encounter for surgical aftercare following surgery on the respiratory system: Secondary | ICD-10-CM | POA: Diagnosis not present

## 2023-03-24 DIAGNOSIS — J90 Pleural effusion, not elsewhere classified: Secondary | ICD-10-CM | POA: Diagnosis not present

## 2023-03-24 DIAGNOSIS — J9601 Acute respiratory failure with hypoxia: Secondary | ICD-10-CM | POA: Diagnosis not present

## 2023-03-24 DIAGNOSIS — C799 Secondary malignant neoplasm of unspecified site: Secondary | ICD-10-CM | POA: Diagnosis not present

## 2023-03-24 DIAGNOSIS — Z4803 Encounter for change or removal of drains: Secondary | ICD-10-CM | POA: Diagnosis not present

## 2023-03-24 DIAGNOSIS — C50911 Malignant neoplasm of unspecified site of right female breast: Secondary | ICD-10-CM | POA: Diagnosis not present

## 2023-03-24 DIAGNOSIS — J942 Hemothorax: Secondary | ICD-10-CM | POA: Diagnosis not present

## 2023-03-24 NOTE — Telephone Encounter (Signed)
Spoke with the pt's nurse  She says that the pt is having trouble with her augmentin  She has tried crushing up and mixing with apple sauce  She states that she has not been consistent with taking this and she is unsure how many doses are left  Per Dr Francine Graven, ok for the pt to d/c med  Nothing further needed

## 2023-03-24 NOTE — Telephone Encounter (Signed)
[facility] home health nurse calling saying PT is having a hard time swallowing the amoxicillin-clavulanate (AUGMENTIN) 875-125 MG tablet [[id number]]   Please call Home Health Nurse Adelina Mings @ [phone]  Pharm is [facility]  [address] [place]   Sending back [facility] as nurse will not be there much longer and states she needs to know "What we are doing for the PT."

## 2023-03-28 ENCOUNTER — Encounter: Payer: Self-pay | Admitting: *Deleted

## 2023-03-28 ENCOUNTER — Telehealth: Payer: Self-pay | Admitting: Pulmonary Disease

## 2023-03-28 ENCOUNTER — Telehealth: Payer: Self-pay | Admitting: *Deleted

## 2023-03-28 NOTE — Telephone Encounter (Signed)
Burna Mortimer from [facility] called in due to sending over a fax for pt home health cert form to be signed and dated. State she still has not received anything back as of yet

## 2023-03-28 NOTE — Telephone Encounter (Signed)
This RN spoke with pt per request- Gina Hunt is scheduled to see Dr Al Pimple next week but she is having some noted concerns with her lungs - she states overall she started with drainage every other day with less then 100 ml but now they are obtaining 500 mls and will be doing it daily due to increased need.  She has completed radiation is and concerned that she needs treatment regarding the lungs.  She has some "heartburn" as well (called last week) and initiated pepcid 20 mg bid with some improvement but still present - she is wondering is there additional or other medications that may help (note she is on a tapering dexamethasone due to spinal compression).  This note will be forwarded to MD for review for further recommendations.

## 2023-03-29 ENCOUNTER — Other Ambulatory Visit: Payer: Self-pay

## 2023-03-29 ENCOUNTER — Emergency Department (HOSPITAL_COMMUNITY)
Admission: EM | Admit: 2023-03-29 | Discharge: 2023-03-29 | Disposition: A | Discharge status: Home / Self Care | Payer: Medicare PPO | Attending: Emergency Medicine | Admitting: Emergency Medicine

## 2023-03-29 ENCOUNTER — Telehealth: Payer: Self-pay | Admitting: *Deleted

## 2023-03-29 ENCOUNTER — Telehealth: Payer: Self-pay | Admitting: Pulmonary Disease

## 2023-03-29 ENCOUNTER — Emergency Department (HOSPITAL_COMMUNITY): Payer: Medicare PPO

## 2023-03-29 ENCOUNTER — Encounter (HOSPITAL_COMMUNITY): Payer: Self-pay | Admitting: *Deleted

## 2023-03-29 DIAGNOSIS — N281 Cyst of kidney, acquired: Secondary | ICD-10-CM | POA: Diagnosis not present

## 2023-03-29 DIAGNOSIS — R110 Nausea: Secondary | ICD-10-CM | POA: Diagnosis not present

## 2023-03-29 DIAGNOSIS — R531 Weakness: Secondary | ICD-10-CM | POA: Insufficient documentation

## 2023-03-29 DIAGNOSIS — R1013 Epigastric pain: Secondary | ICD-10-CM | POA: Diagnosis not present

## 2023-03-29 DIAGNOSIS — Z853 Personal history of malignant neoplasm of breast: Secondary | ICD-10-CM | POA: Insufficient documentation

## 2023-03-29 DIAGNOSIS — J939 Pneumothorax, unspecified: Secondary | ICD-10-CM | POA: Diagnosis not present

## 2023-03-29 DIAGNOSIS — K449 Diaphragmatic hernia without obstruction or gangrene: Secondary | ICD-10-CM | POA: Diagnosis not present

## 2023-03-29 DIAGNOSIS — C7802 Secondary malignant neoplasm of left lung: Secondary | ICD-10-CM | POA: Diagnosis not present

## 2023-03-29 DIAGNOSIS — J948 Other specified pleural conditions: Secondary | ICD-10-CM

## 2023-03-29 DIAGNOSIS — J90 Pleural effusion, not elsewhere classified: Secondary | ICD-10-CM | POA: Diagnosis not present

## 2023-03-29 DIAGNOSIS — R918 Other nonspecific abnormal finding of lung field: Secondary | ICD-10-CM | POA: Diagnosis not present

## 2023-03-29 DIAGNOSIS — R739 Hyperglycemia, unspecified: Secondary | ICD-10-CM | POA: Diagnosis not present

## 2023-03-29 DIAGNOSIS — R1084 Generalized abdominal pain: Secondary | ICD-10-CM | POA: Diagnosis not present

## 2023-03-29 DIAGNOSIS — C801 Malignant (primary) neoplasm, unspecified: Secondary | ICD-10-CM | POA: Diagnosis not present

## 2023-03-29 LAB — URINALYSIS, ROUTINE W REFLEX MICROSCOPIC
Bacteria, UA: NONE SEEN
Bilirubin Urine: NEGATIVE
Glucose, UA: NEGATIVE mg/dL
Hgb urine dipstick: NEGATIVE
Ketones, ur: NEGATIVE mg/dL
Nitrite: NEGATIVE
Protein, ur: NEGATIVE mg/dL
Specific Gravity, Urine: 1.021 (ref 1.005–1.030)
pH: 5 (ref 5.0–8.0)

## 2023-03-29 LAB — CBC
HCT: 46.6 % — ABNORMAL HIGH (ref 36.0–46.0)
Hemoglobin: 15.4 g/dL — ABNORMAL HIGH (ref 12.0–15.0)
MCH: 30.6 pg (ref 26.0–34.0)
MCHC: 33 g/dL (ref 30.0–36.0)
MCV: 92.5 fL (ref 80.0–100.0)
Platelets: 218 10*3/uL (ref 150–400)
RBC: 5.04 MIL/uL (ref 3.87–5.11)
RDW: 14.2 % (ref 11.5–15.5)
WBC: 8.3 10*3/uL (ref 4.0–10.5)
nRBC: 0 % (ref 0.0–0.2)

## 2023-03-29 LAB — LACTATE DEHYDROGENASE, PLEURAL OR PERITONEAL FLUID: LD, Fluid: 1727 U/L — ABNORMAL HIGH (ref 3–23)

## 2023-03-29 LAB — BODY FLUID CELL COUNT WITH DIFFERENTIAL
Eos, Fluid: 0 %
Lymphs, Fluid: 19 %
Monocyte-Macrophage-Serous Fluid: 81 % (ref 50–90)
Neutrophil Count, Fluid: 0 % (ref 0–25)
Total Nucleated Cell Count, Fluid: 7126 cu mm — ABNORMAL HIGH (ref 0–1000)

## 2023-03-29 LAB — GLUCOSE, PLEURAL OR PERITONEAL FLUID: Glucose, Fluid: 121 mg/dL

## 2023-03-29 LAB — PROTEIN, PLEURAL OR PERITONEAL FLUID: Total protein, fluid: <3 g/dL

## 2023-03-29 LAB — COMPREHENSIVE METABOLIC PANEL
ALT: 18 U/L (ref 0–44)
AST: 31 U/L (ref 15–41)
Albumin: 2.7 g/dL — ABNORMAL LOW (ref 3.5–5.0)
Alkaline Phosphatase: 54 U/L (ref 38–126)
Anion gap: 12 (ref 5–15)
BUN: 20 mg/dL (ref 8–23)
CO2: 24 mmol/L (ref 22–32)
Calcium: 8.2 mg/dL — ABNORMAL LOW (ref 8.9–10.3)
Chloride: 95 mmol/L — ABNORMAL LOW (ref 98–111)
Creatinine, Ser: 0.7 mg/dL (ref 0.44–1.00)
GFR, Estimated: >60 mL/min (ref >60)
Glucose, Bld: 201 mg/dL — ABNORMAL HIGH (ref 70–99)
Potassium: 4.3 mmol/L (ref 3.5–5.1)
Sodium: 131 mmol/L — ABNORMAL LOW (ref 135–145)
Total Bilirubin: 0.7 mg/dL (ref 0.3–1.2)
Total Protein: 5.5 g/dL — ABNORMAL LOW (ref 6.5–8.1)

## 2023-03-29 LAB — LIPASE, BLOOD: Lipase: 29 U/L (ref 11–51)

## 2023-03-29 MED ORDER — IOHEXOL 300 MG/ML  SOLN
80.0000 mL | Freq: Once | INTRAMUSCULAR | Status: AC | PRN
  Administered 2023-03-29: 80 mL via INTRAVENOUS

## 2023-03-29 MED ORDER — LACTATED RINGERS IV BOLUS
1000.0000 mL | Freq: Once | INTRAVENOUS | Status: AC
  Administered 2023-03-29: 1000 mL via INTRAVENOUS

## 2023-03-29 MED ORDER — ONDANSETRON HCL 4 MG/2ML IJ SOLN
4.0000 mg | Freq: Once | INTRAMUSCULAR | Status: AC
  Administered 2023-03-29: 4 mg via INTRAVENOUS
  Filled 2023-03-29: qty 2

## 2023-03-29 MED ORDER — PROCHLORPERAZINE MALEATE 5 MG PO TABS: 5.0000 mg | ORAL_TABLET | Freq: Two times a day (BID) | ORAL | 0 refills | Status: DC | PRN

## 2023-03-29 NOTE — ED Provider Notes (Signed)
  Physical Exam  BP 99/63   Pulse 78   Temp 97.9 F (36.6 C) (Oral)   Resp 17   SpO2 93%   Physical Exam  Procedures  Procedures  ED Course / MDM   Clinical Course as of 03/29/23 1900  Wed Mar 29, 2023  1440 Labs with mild hyperglycemia without evidence of DKA. No severe dehydration. Otherwise without obvious explanation of weakness. Will have orthostatics and ambulatory trial. [VK]  1458 Upon reassessment, patient reports feeling well and wants to drink. If she is able to ambulate steadily after fluids would prefer discharge home. [VK]  1520 Patient signed out to Dr. Silverio Lay pending CXR and ambulatory trial. [VK]    Clinical Course User Index [VK] Rexford Maus, DO   Medical Decision Making Care assumed at 3 PM.  Patient is here with hypotension and abdominal pain and shortness of breath.  Patient has a Pleurx catheter placed by pulmonology.  Patient has been having her fluids drawn off periodically and has worsening pain and shortness of breath and hypotension today.  Signed out pending x-ray and labs and reassessment  5 pm Chest x-ray showed small pneumothorax.  Also some nonspecific air under the diaphragm.  Plan to get CT chest abdomen pelvis.  7:02 PM CT chest showed hydropneumothorax with Pleurx catheter in place.  Patient has mets in the lungs and also in her spine.  I discussed case with Dr. Katrinka Blazing from critical care.  He saw the patient and drew off sample from the Pleurx catheter.  He wants to hold off of antibiotics right now since she is nauseated at baseline.  He states that pulmonology will call her tomorrow if she needs antibiotics.  At this point, patient stable for discharge.  Problems Addressed: Generalized weakness: acute illness or injury Hydropneumothorax: acute illness or injury Nausea: acute illness or injury  Amount and/or Complexity of Data Reviewed Labs: ordered. Radiology: ordered.  Risk Prescription drug management.          Charlynne Pander, MD 03/29/23 [phone]

## 2023-03-29 NOTE — Telephone Encounter (Signed)
Called and spoke with Gina Hunt. She verbalized understanding. Patient decided to go ahead to the ED.   Nothing further needed at time of call.

## 2023-03-29 NOTE — Progress Notes (Signed)
03/29/2023 Seen briefly. Discussed case with EDP. Will send fluid for culture and call patient if positive. Entrapped lung not unexpected given extent of disease. Patient reassured.  Myrla Halsted MD PCCM

## 2023-03-29 NOTE — Telephone Encounter (Signed)
Called and spoke with Adelina Mings. She stated that she went to the patient's home today to drain her Pleurx cath. She checked her BP and it was 85/45. Patient denied any dizziness, fever or weakness. She has noticed a difference in the amount of fluid being drained. Some days it ranges from 10-62mL. Other days it ranges from 211mL-500mL. Per Adelina Mings, the insertion site does not appear to be infected. I could hear the patient in the background stating that she felt fine. Because of the extremely low BP, she does not feel comfortable doing todays drain. I advised her to not do so until she hears back from our office. I also advised her that with her BP being that low, it might be best for her to go the ED. She verbalized understanding.   Maralyn Sago, do you have any other recommendations besides going to the ED?

## 2023-03-29 NOTE — Telephone Encounter (Signed)
Home nurse called from [facility] to report that the patient's BP is 85/45.  She needs to speak with a nurse asap.  Please call at [phone]

## 2023-03-29 NOTE — ED Triage Notes (Signed)
Pt with diagnosis of breast cancer with mets to lung is here for generalized weakness, fatigue, soft BP at home (per ems which was noted by home health nurse).  Pt also has some abdominal pain and known abdominal hernia.  Pt has had nausea and vomiting and LBM yesterday.  Pt has had left mastectomy June 2023. Pt lives at home and has constant help from family.

## 2023-03-29 NOTE — Telephone Encounter (Signed)
This RN received call from pt's home health nurse per concern for noted B/P of 85/45.  PleurX has not been drained today.  Pt is not on B/P medications.  She denies any SHOB or dizziness ( pt report ) but she has fallen several times without injury.  Pt reports she has not been able to hydrate well.  Per Adelina Mings call to pulmonary they are recommending for pt to proceed to the ER - per communication with our [facility] provider and MD- pt should proceed to ER due to multiple issues that likely cannot be managed in this office.  Adelina Mings RN at home and pt are in agreement to above.

## 2023-03-29 NOTE — ED Notes (Addendum)
Checked pulse oximetry while ambulating pt in the hallway 02 sat remained above 95%, pt was one assist during ambulation

## 2023-03-29 NOTE — ED Provider Notes (Signed)
[place] EMERGENCY DEPARTMENT AT [facility] Provider Note   CSN: [id number] Arrival date & time: 03/29/23  1351     History  Chief Complaint  Patient presents with   Abdominal Pain    Gina Hunt is a 77 y.o. female.  Patient is a 77 year old female with a past medical history of breast cancer with right-sided pleural effusion and pleural catheter in place not currently on chemo or radiation presenting to the emergency department with weakness.  Patient states over the last several days she has had increasing weakness and fatigue.  She states that she has been walking with a walker however has had 2 falls in the last week in the bathroom.  She states she is unsure what is making her fall but denies hitting her head or losing consciousness and denies any injuries from the fall.  She states that she has been having nausea with frequent vomiting and has not had much of an appetite.  She states that her nurse came today to drain her pleural VAC however was found to be hypotensive in the 80s and recommended that she come to the emergency department.  She states that she has some mild associated epigastric pain.  She denies any fevers or chills, diarrhea or constipation, dysuria or hematuria.  The history is provided by the patient and a relative.  Abdominal Pain      Home Medications Prior to Admission medications   Medication Sig Start Date End Date Taking? Authorizing Provider  sucralfate (CARAFATE) 1 g tablet Take 1 tablet (1 g total) by mouth 4 (four) times daily. Dissolve each tablet in 15 cc water before use. 03/17/23   Dorothy Puffer, MD  amoxicillin-clavulanate (AUGMENTIN) 875-125 MG tablet Take 1 tablet by mouth 2 (two) times daily. 03/16/23   Martina Sinner, MD  cycloSPORINE (RESTASIS) 0.05 % ophthalmic emulsion Place 1 drop into both eyes 2 (two) times daily. 10/11/21   Magrinat, Valentino Hue, MD  dexamethasone (DECADRON) 4 MG tablet Take 1 tablet (4 mg total) by  mouth 3 (three) times daily. Take with food. 03/06/23   Loa Socks, NP  dicyclomine (BENTYL) 10 MG capsule Take 1 capsule (10 mg total) by mouth every 6 (six) hours as needed for spasms. 10/26/21   Cirigliano, Vito V, DO  DULoxetine (CYMBALTA) 60 MG capsule TAKE 1 CAPSULE TWICE DAILY 02/09/23   Sharlene Dory, DO  esomeprazole (NEXIUM) 40 MG capsule Take 1 capsule (40 mg total) by mouth daily. 02/01/23   Sharlene Dory, DO  famotidine (PEPCID) 20 MG tablet Take 1 tablet (20 mg total) by mouth at bedtime. 03/16/23   Martina Sinner, MD  fluconazole (DIFLUCAN) 100 MG tablet Take 1 tablet (100 mg total) by mouth daily. Take 2 (two) tablets (200 mg total) by mouth today, THEN take 1 (one) tablet (100 mg total) by mouth daily for 9 days. Do not take your statin during the course of this treatment. Resume statin after you finish this prescription. 03/20/23   Erven Colla, PA-C  gabapentin (NEURONTIN) 300 MG capsule Take 1 capsule (300 mg total) by mouth 3 (three) times daily. 11/23/21   Sharlene Dory, DO  levothyroxine (SYNTHROID) 50 MCG tablet TAKE 1 TABLET EVERY DAY BEFORE BREAKFAST Patient taking differently: Take 50 mcg by mouth daily before breakfast. 10/21/22   Wendling, [name], DO  LORazepam (ATIVAN) 0.5 MG tablet 1/2 to 1 tab q 8 hours as needed for nausea 03/09/23   Iruku,  Burnice Logan, MD  olmesartan (BENICAR) 20 MG tablet Take 1 tablet (20 mg total) by mouth daily. 10/14/22   Sharlene Dory, DO  oxyCODONE (OXY IR/ROXICODONE) 5 MG immediate release tablet Take 1 tablet (5 mg total) by mouth every 6 (six) hours as needed for moderate pain. 02/28/23   Loa Socks, NP  prochlorperazine (COMPAZINE) 5 MG tablet Take 1 tablet (5 mg total) by mouth every 12 (twelve) hours as needed for nausea or vomiting. 03/09/23   Rachel Moulds, MD  simvastatin (ZOCOR) 40 MG tablet TAKE 1 TABLET EVERY DAY 07/06/22   Sharlene Dory, DO      Allergies     Codeine and Morphine and codeine    Review of Systems   Review of Systems  Gastrointestinal:  Positive for abdominal pain.    Physical Exam Updated Vital Signs BP 96/66   Pulse 80   Temp (!) 97.5 F (36.4 C) (Oral)   Resp 17   SpO2 93%  Physical Exam Vitals and nursing note reviewed.  Constitutional:      Appearance: She is well-developed. She is ill-appearing (chronically).  HENT:     Head: Normocephalic and atraumatic.     Mouth/Throat:     Pharynx: Oropharynx is clear.     Comments: Mildly dry lips Eyes:     Extraocular Movements: Extraocular movements intact.  Cardiovascular:     Rate and Rhythm: Normal rate and regular rhythm.     Heart sounds: Normal heart sounds.  Pulmonary:     Effort: Pulmonary effort is normal.     Breath sounds: Normal breath sounds.  Abdominal:     General: Abdomen is flat.     Palpations: Abdomen is soft.     Tenderness: There is no abdominal tenderness.  Skin:    General: Skin is warm and dry.  Neurological:     General: No focal deficit present.     Mental Status: She is alert and oriented to person, place, and time.  Psychiatric:        Mood and Affect: Mood normal.        Behavior: Behavior normal.     ED Results / Procedures / Treatments   Labs (all labs ordered are listed, but only abnormal results are displayed) Labs Reviewed  COMPREHENSIVE METABOLIC PANEL - Abnormal; Notable for the following components:      Result Value   Sodium 131 (*)    Chloride 95 (*)    Glucose, Bld 201 (*)    Calcium 8.2 (*)    Total Protein 5.5 (*)    Albumin 2.7 (*)    All other components within normal limits  CBC - Abnormal; Notable for the following components:   Hemoglobin 15.4 (*)    HCT 46.6 (*)    All other components within normal limits  LIPASE, BLOOD  URINALYSIS, ROUTINE W REFLEX MICROSCOPIC    EKG None  Radiology No results found.  Procedures Procedures    Medications Ordered in ED Medications  lactated ringers  bolus 1,000 mL (1,000 mLs Intravenous New Bag/Given 03/29/23 1443)    ED Course/ Medical Decision Making/ A&P Clinical Course as of 03/29/23 1520  Wed Mar 29, 2023  1440 Labs with mild hyperglycemia without evidence of DKA. No severe dehydration. Otherwise without obvious explanation of weakness. Will have orthostatics and ambulatory trial. [VK]  1458 Upon reassessment, patient reports feeling well and wants to drink. If she is able to ambulate steadily after fluids would prefer discharge home. [  VK]  1520 Patient signed out to Dr. Silverio Lay pending CXR and ambulatory trial. [VK]    Clinical Course User Index [VK] Rexford Maus, DO                             Medical Decision Making This patient presents to the ED with chief complaint(s) of weakness, nausea with pertinent past medical history of breast cancer, malignant pleural effusion which further complicates the presenting complaint. The complaint involves an extensive differential diagnosis and also carries with it a high risk of complications and morbidity.    The differential diagnosis includes dehydration, electrolyte abnormality, arrhythmia, anemia, pancreatitis, hepatitis, gastritis, GERD, PUD  Additional history obtained: Additional history obtained from family Records reviewed outpatient oncology and pulmonology records  ED Course and Reassessment: On patient's arrival to the emergency department she is hemodynamically stable in no acute distress She is frail and chronically ill-appearing.  Blood pressures here have improved.  She will be started on a liter of fluids and will have labs performed to evaluate for causes of her weakness and nausea.  She is no signs of any injuries from her falls at home.  Independent labs interpretation:  The following labs were independently interpreted: within normal range  Independent visualization of imaging: - N/A      Amount and/or Complexity of Data Reviewed Labs:  ordered. Radiology: ordered.          Final Clinical Impression(s) / ED Diagnoses Final diagnoses:  Generalized weakness  Nausea    Rx / DC Orders ED Discharge Orders     None         Rexford Maus, DO 03/29/23 1520

## 2023-03-29 NOTE — Discharge Instructions (Addendum)
You have hydropneumothorax on the right side.  The fluid from the catheter was sent off to the lab and pulmonology will call you tomorrow if you need antibiotics.   You were seen in the emergency department for your nausea and generalized weakness.  I have prescribed extra Compazine as needed for nausea. Your blood pressure was slightly low likely due to some mild dehydration but your workup showed no signs of severe dehydration.     You should make sure that you are drinking plenty of fluids and can drink protein shakes to help make sure that you are getting enough nutrition.    You can follow-up with your primary doctor and your oncologist to have your symptoms rechecked.  You should return to the emergency department if you are having recurrent falls and injure yourself, you are too weak to get yourself to walk, or if you have any other new or concerning symptoms.

## 2023-03-29 NOTE — ED Notes (Signed)
Patient transported to X-ray 

## 2023-03-30 ENCOUNTER — Telehealth: Payer: Self-pay | Admitting: *Deleted

## 2023-03-30 NOTE — Telephone Encounter (Signed)
Spoke with the patient to see how she was doing with tapering off her Dexamethasone.  She reports she is doing well with the dosage decrease.  No complaints or concerns at this time.  She reports she was recently in the emergency department and had some fluid drained via pleurx.  She continues to follow with Pulmonology.  She was encouraged to reach out to our office with any radiation oncology concerns.  She verbalized understanding and was appreciative for the call.  Lind Covert RN, BSN

## 2023-03-30 NOTE — Telephone Encounter (Signed)
See previous telephone encounter .  Nothing else further needed.

## 2023-03-31 ENCOUNTER — Telehealth: Payer: Self-pay | Admitting: *Deleted

## 2023-03-31 ENCOUNTER — Encounter: Payer: Medicare HMO | Admitting: Family Medicine

## 2023-03-31 LAB — BODY FLUID CULTURE W GRAM STAIN

## 2023-03-31 NOTE — Telephone Encounter (Signed)
This RN received call from pt's home health nurse reporting pt has had 2 reported episodes of "black tarry stools".  This RN arranged with [facility] for visit but when pt contacted she informed this nurse "oh that was earlier this week and now my stools are normal in color ( she is having looseness of stool). Note pt went to the ER on 5/29 with heme of 15.  She states she is overall "feeling a little better since earlier this week".  She is having her PleurX drained today "and usually that helps a lot"  She does not state any new symptoms and does not feel coming in today is needful.  This note will be forwarded to provider for review of communication.

## 2023-04-01 LAB — BODY FLUID CULTURE W GRAM STAIN: Culture: NO GROWTH

## 2023-04-02 LAB — BODY FLUID CULTURE W GRAM STAIN

## 2023-04-03 ENCOUNTER — Telehealth: Payer: Self-pay | Admitting: Pulmonary Disease

## 2023-04-03 ENCOUNTER — Telehealth: Payer: Self-pay | Admitting: *Deleted

## 2023-04-03 ENCOUNTER — Other Ambulatory Visit: Payer: Self-pay | Admitting: Radiation Oncology

## 2023-04-03 MED ORDER — NYSTATIN 100000 UNIT/ML MT SUSP: 5.0000 mL | Freq: Four times a day (QID) | OROMUCOSAL | 0 refills | Status: DC

## 2023-04-03 NOTE — Telephone Encounter (Signed)
Dr. Francine Graven is in clinic today 6/3. Routing this encounter to both him and Cherina for review. Please advise if this has been received.

## 2023-04-03 NOTE — Progress Notes (Signed)
After discussing with nursing, pt's family called with concerns about symptoms of oral thrush. The patient has recently been on high dose steroids due to her spinal disease which was recently radiated. She is tapering her steroids currently but based on her clinical description of oral plaques, erythema of the tongue and discomfort, we will proceed with empiric nystatin to treat thrush.

## 2023-04-03 NOTE — Telephone Encounter (Signed)
Left a message for the patient to let her know that we sent in a prescription for Nystatin solution that she is to swish and swallow and to inquire if she is having any lower back pain.  Call back number [phone]) left.  Lind Covert RN, BSN

## 2023-04-03 NOTE — Telephone Encounter (Signed)
Forms have been faxed to [facility].

## 2023-04-04 ENCOUNTER — Ambulatory Visit: Admit: 2023-04-04 | Payer: Medicare HMO | Admitting: General Surgery

## 2023-04-04 SURGERY — REPAIR, HERNIA, HIATAL, ROBOT-ASSISTED
Anesthesia: General

## 2023-04-04 NOTE — Radiation Completion Notes (Signed)
  Radiation Oncology         [phone] ________________________________  Name: Gina Hunt MRN: [id number]  Date of Service: 04/03/2023  DOB: 02/08/1946  End of Treatment Note  Diagnosis: Recurrent metastatic IIA [id number], grade 3 ER/PR positive invasive ductal carcinoma of the left breast now with bone, nodal, and pulmonary metastases with concerns for impending cord compression.   Intent: Palliative     ==========DELIVERED PLANS==========  First Treatment Date: 2023-03-09 - Last Treatment Date: 2023-03-22   Plan Name: Spine Site: Thoracic Spine Technique: 3D Mode: Photon Dose Per Fraction: 3 Gy Prescribed Dose (Delivered / Prescribed): 30 Gy / 30 Gy Prescribed Fxs (Delivered / Prescribed): 10 / 10     ==========ON TREATMENT VISIT DATES========== 2023-03-10, 2023-03-17     See weekly On Treatment Notes in Epic for details. The patient tolerated radiation. She developed fatigue and continued to have pain though this was somewhat improved at the conclusion of therapy. She was given steroid taper instructions, and had also been offered additional radiation to the sacrum if her symptoms progressed in that area as well.  The patient will receive a call in about one month from the radiation oncology department. She will continue follow up with Dr. Al Pimple  as well.      Osker Mason, PAC

## 2023-04-05 ENCOUNTER — Inpatient Hospital Stay: Payer: Medicare PPO

## 2023-04-06 ENCOUNTER — Other Ambulatory Visit: Payer: Self-pay

## 2023-04-06 ENCOUNTER — Inpatient Hospital Stay (HOSPITAL_BASED_OUTPATIENT_CLINIC_OR_DEPARTMENT_OTHER): Payer: Medicare PPO | Admitting: Hematology and Oncology

## 2023-04-06 ENCOUNTER — Telehealth: Payer: Self-pay | Admitting: *Deleted

## 2023-04-06 ENCOUNTER — Inpatient Hospital Stay: Payer: Medicare PPO | Attending: Hematology and Oncology

## 2023-04-06 ENCOUNTER — Inpatient Hospital Stay: Payer: Medicare PPO

## 2023-04-06 VITALS — BP 105/73 | HR 105 | Temp 97.7°F | Resp 18 | Wt 86.7 lb

## 2023-04-06 DIAGNOSIS — Z9221 Personal history of antineoplastic chemotherapy: Secondary | ICD-10-CM | POA: Insufficient documentation

## 2023-04-06 DIAGNOSIS — C7951 Secondary malignant neoplasm of bone: Secondary | ICD-10-CM | POA: Diagnosis not present

## 2023-04-06 DIAGNOSIS — G893 Neoplasm related pain (acute) (chronic): Secondary | ICD-10-CM

## 2023-04-06 DIAGNOSIS — Z7189 Other specified counseling: Secondary | ICD-10-CM | POA: Diagnosis not present

## 2023-04-06 DIAGNOSIS — J90 Pleural effusion, not elsewhere classified: Secondary | ICD-10-CM | POA: Insufficient documentation

## 2023-04-06 DIAGNOSIS — Z170 Estrogen receptor positive status [ER+]: Secondary | ICD-10-CM

## 2023-04-06 DIAGNOSIS — R110 Nausea: Secondary | ICD-10-CM

## 2023-04-06 DIAGNOSIS — C50412 Malignant neoplasm of upper-outer quadrant of left female breast: Secondary | ICD-10-CM | POA: Diagnosis not present

## 2023-04-06 DIAGNOSIS — Z9012 Acquired absence of left breast and nipple: Secondary | ICD-10-CM | POA: Insufficient documentation

## 2023-04-06 DIAGNOSIS — C50911 Malignant neoplasm of unspecified site of right female breast: Secondary | ICD-10-CM

## 2023-04-06 LAB — CMP (CANCER CENTER ONLY)
ALT: 12 U/L (ref 0–44)
AST: 19 U/L (ref 15–41)
Albumin: 3.3 g/dL — ABNORMAL LOW (ref 3.5–5.0)
Alkaline Phosphatase: 67 U/L (ref 38–126)
Anion gap: 10 (ref 5–15)
BUN: 17 mg/dL (ref 8–23)
CO2: 25 mmol/L (ref 22–32)
Calcium: 8.5 mg/dL — ABNORMAL LOW (ref 8.9–10.3)
Chloride: 96 mmol/L — ABNORMAL LOW (ref 98–111)
Creatinine: 0.71 mg/dL (ref 0.44–1.00)
GFR, Estimated: >60 mL/min (ref >60)
Glucose, Bld: 263 mg/dL — ABNORMAL HIGH (ref 70–99)
Potassium: 4.5 mmol/L (ref 3.5–5.1)
Sodium: 131 mmol/L — ABNORMAL LOW (ref 135–145)
Total Bilirubin: 0.5 mg/dL (ref 0.3–1.2)
Total Protein: 5.9 g/dL — ABNORMAL LOW (ref 6.5–8.1)

## 2023-04-06 LAB — CBC WITH DIFFERENTIAL (CANCER CENTER ONLY)
Abs Immature Granulocytes: 0.04 10*3/uL (ref 0.00–0.07)
Basophils Absolute: 0 10*3/uL (ref 0.0–0.1)
Basophils Relative: 0 %
Eosinophils Absolute: 0 10*3/uL (ref 0.0–0.5)
Eosinophils Relative: 0 %
HCT: 46.8 % — ABNORMAL HIGH (ref 36.0–46.0)
Hemoglobin: 16 g/dL — ABNORMAL HIGH (ref 12.0–15.0)
Immature Granulocytes: 0 %
Lymphocytes Relative: 8 %
Lymphs Abs: 0.9 10*3/uL (ref 0.7–4.0)
MCH: 31 pg (ref 26.0–34.0)
MCHC: 34.2 g/dL (ref 30.0–36.0)
MCV: 90.7 fL (ref 80.0–100.0)
Monocytes Absolute: 0.5 10*3/uL (ref 0.1–1.0)
Monocytes Relative: 5 %
Neutro Abs: 9.8 10*3/uL — ABNORMAL HIGH (ref 1.7–7.7)
Neutrophils Relative %: 87 %
Platelet Count: 219 10*3/uL (ref 150–400)
RBC: 5.16 MIL/uL — ABNORMAL HIGH (ref 3.87–5.11)
RDW: 15.2 % (ref 11.5–15.5)
WBC Count: 11.2 10*3/uL — ABNORMAL HIGH (ref 4.0–10.5)
nRBC: 0 % (ref 0.0–0.2)

## 2023-04-06 MED ORDER — LORAZEPAM 0.5 MG PO TABS: ORAL_TABLET | ORAL | 1 refills | Status: DC

## 2023-04-06 MED ORDER — ONDANSETRON 4 MG PO TBDP: 4.0000 mg | ORAL_TABLET | Freq: Three times a day (TID) | ORAL | 0 refills | Status: DC | PRN

## 2023-04-06 NOTE — Telephone Encounter (Signed)
This RN contacted [facility] for hospice services - referral given to Drenda Freeze - no further needs.

## 2023-04-06 NOTE — Progress Notes (Signed)
[facility] Cancer Follow up:    Gina Dory, DO [address] [place] [address]   DIAGNOSIS:  Cancer Staging  Malignant neoplasm of upper-outer quadrant of left breast in female, estrogen receptor positive (HCC) Staging form: Breast, AJCC 8th Edition - Clinical stage from 12/16/2020: Stage IIA (cT2, cN0, cM0, G3, ER+, PR+, HER2-) - Signed by Lowella Dell, MD on 12/16/2020 Stage prefix: Initial diagnosis   SUMMARY OF ONCOLOGIC HISTORY: Gina Hunt, Gina Hunt woman status post left breast upper outer quadrant biopsy 12/08/2020 for a clinical T2N0, stage IIA invasive ductal carcinoma, grade 3, estrogen and progesterone receptor positive, HER-2 not amplified, with an MIB-1 of 30%   (1) Oncotype obtained from the original biopsy shows a score of 32, predicting a risk of recurrence outside the breast in the next 9 years of 20% if node-negative, 25% if node positive, if antiestrogens are the only systemic treatment, also predicting a greater than 15% benefit from chemotherapy   (2) genetics testing 01/01/2021 through the [facility] CustomNext-Cancer +RNAinsight Panel found no deleterious mutations in APC, ATM, AXIN2, BARD1, BMPR1A, BRCA1, BRCA2, BRIP1, CDH1, CDK4, CDKN2A, CHEK2, DICER1, EPCAM, GREM1, HOXB13, MEN1, MLH1, MSH2, MSH3, MSH6, MUTYH, NBN, NF1, NF2, NTHL1, PALB2, PMS2, POLD1, POLE, PTEN, RAD51C, RAD51D, RECQL, RET, SDHA, SDHAF2, SDHB, SDHC, SDHD, SMAD4, SMARCA4, STK11, TP53, TSC1, TSC2, and VHL.  RNA data is routinely analyzed for use in variant interpretation for all genes.   (3) anastrozole started neoadjuvantly 12/16/2020, discontinued 03/2021 with multiple side effects   (4) status post left mastectomy and sentinel lymph node sampling 04/07/2021 for a pT2 pN0, stage IIA invasive ductal carcinoma, grade 2, with negative margins             (A) a single left axilla lymph node removed             (B) repeat prognostic panel finds the cancer  estrogen receptor positive, progesterone receptor and HER2 negative             (C) palpable lesion in the left breast consistent with evolving 0.5 oil cyst/fat necrosis by ultrasonography at [facility] on 07/20/2021   (5) adjuvant chemotherapy with cyclophosphamide, methotrexate and fluorouracil (CMF) started 05/17/2021, repeated every 21 days times 8, last dose 10/11/2021   (6) started tamoxifen 10/31/2021 discontinued 02/28/2023  METASTATIC DISEASE: Hospitalized for one week beginning 4/18 with malignant pleural effusion, thoracentesis noted metastatic breast cancer, ER+, PR-, HER-2 -.  CT chest abdomen pelvis scheduled for 03/22/2023,   CURRENT THERAPY: to start Faslodex  INTERVAL HISTORY:  Gina Hunt 77 y.o. female returns for f/u after hospitalization from 02/16/2023 through 02/23/2023 that demonstrated right pleural effusion, + metastatic breast cancer, ER+, PR-, HER-2 - (0). She had pleurx placement during her hospitalization.   She is here today with her niece and friend. She was in the hospital recently. She hasn't been doing well. She is very weak, not able to eat, has no appetite, complains of epigastric pain. She apparently hasn't been taking carafate and nexium. She says radiation has certainly helped her back pain.  Overall she is not willing to proceed with any aggressive treatments.  She apparently has been telling her family that she would like to go home which is what she refers to.  She is very aware of hospice, her husband was in hospice.  She has great support system, her niece and her friends have stepped up and have been helping her. Rest of the  pertinent 10 point ROS reviewed and negative   Patient Active Problem List   Diagnosis Date Noted   Breast carcinoma metastatic to multiple sites, right (HCC) 02/18/2023   Pleural effusion on right 02/18/2023   Pleural effusion 02/17/2023   History of breast cancer 02/17/2023   Malnutrition of moderate degree (HCC) 02/17/2023    Acute respiratory failure with hypoxia (HCC) 02/16/2023   Seasonal affective disorder (HCC) 10/14/2022   Essential hypertension 10/14/2022   Type 2 diabetes mellitus with hyperglycemia, without long-term current use of insulin (HCC) 10/14/2022   Dysphagia    Gastroesophageal reflux disease    Genetic testing 12/29/2020   Family history of breast cancer 12/16/2020   Family history of gastric cancer 12/16/2020   Malignant neoplasm of upper-outer quadrant of left breast in female, estrogen receptor positive (HCC) 12/14/2020   DNR (do not resuscitate) 12/20/2019   Hyperlipidemia 11/12/2018   Stress incontinence 10/12/2018   Other fatigue 05/19/2017   Dyslipidemia 03/08/2017   History of peptic ulcer 03/06/2017   History of hypothyroidism 03/06/2017   Fibromyalgia 09/06/2016   Primary osteoarthritis of both knees 09/06/2016   Primary osteoarthritis of both hands 09/06/2016   DJD (degenerative joint disease), cervical 09/06/2016   Peptic ulcer disease 09/06/2016   Hypothyroidism 09/06/2016    is allergic to codeine and morphine and codeine.  MEDICAL HISTORY: Past Medical History:  Diagnosis Date   Anxiety    Breast cancer (HCC) 01/2021   left breast IDC   Complication of anesthesia    Family history of breast cancer 12/16/2020   Family history of gastric cancer 12/16/2020   Fibromyalgia    GERD (gastroesophageal reflux disease)    with hiatal hernia   Hyperlipidemia    Hypothyroidism    Osteoarthritis    knees, hands, fingers   PONV (postoperative nausea and vomiting)    Port-A-Cath in place 05/26/2021   Skin cancer     SURGICAL HISTORY: Past Surgical History:  Procedure Laterality Date   ABDOMINAL HYSTERECTOMY     CHEST TUBE INSERTION N/A 02/22/2023   Procedure: INSERTION PLEURAL DRAINAGE CATHETER;  Surgeon: Martina Sinner, MD;  Location: [facility] ENDOSCOPY;  Service: Pulmonary;  Laterality: N/A;   COLONOSCOPY  11/24/2015   ESOPHAGEAL MANOMETRY N/A 07/20/2022    Procedure: ESOPHAGEAL MANOMETRY (EM);  Surgeon: Shellia Cleverly, DO;  Location: [facility] ENDOSCOPY;  Service: Gastroenterology;  Laterality: N/A;   EVACUATION BREAST HEMATOMA Left 04/07/2021   Procedure: EVACUATION HEMATOMA BREAST;  Surgeon: Emelia Loron, MD;  Location: [facility];  Service: General;  Laterality: Left;   LIVER SURGERY     Removed appendix and gall bladder   PORTA CATH INSERTION Right 04/07/2021   Procedure: PORTA CATH INSERTION;  Surgeon: Emelia Loron, MD;  Location: [facility];  Service: General;  Laterality: Right;   SIMPLE MASTECTOMY WITH AXILLARY SENTINEL NODE BIOPSY Left 04/07/2021   Procedure: LEFT SIMPLE MASTECTOMY WITH LEFT AXILLARY SENTINEL NODE BIOPSY;  Surgeon: Emelia Loron, MD;  Location: [facility];  Service: General;  Laterality: Left;   TONSILLECTOMY     TRIGGER FINGER RELEASE Right 03/03/2022    SOCIAL HISTORY: Social History   Socioeconomic History   Marital status: Widowed    Spouse name: Not on file   Number of children: Not on file   Years of education: Not on file   Highest education level: Not on file  Occupational History   Occupation: retired  Tobacco Use   Smoking status: Never   Smokeless  tobacco: Never  Vaping Use   Vaping Use: Never used  Substance and Sexual Activity   Alcohol use: Yes    Comment: rarely    Drug use: Never   Sexual activity: Not Currently    Birth control/protection: Surgical  Other Topics Concern   Not on file  Social History Narrative   Not on file   Social Determinants of Health   Financial Resource Strain: Medium Risk (01/30/2023)   Overall Financial Resource Strain (CARDIA)    Difficulty of Paying Living Expenses: Somewhat hard  Food Insecurity: No Food Insecurity (02/17/2023)   Hunger Vital Sign    Worried About Running Out of Food in the Last Year: Never true    Ran Out of Food in the Last Year: Never true  Transportation Needs: No  Transportation Needs (02/17/2023)   PRAPARE - Administrator, Civil Service (Medical): No    Lack of Transportation (Non-Medical): No  Physical Activity: Sufficiently Active (01/28/2022)   Exercise Vital Sign    Days of Exercise per Week: 6 days    Minutes of Exercise per Session: 60 min  Stress: Stress Concern Present (01/30/2023)   [employer] of Occupational Health - Occupational Stress Questionnaire    Feeling of Stress : To some extent  Social Connections: Moderately Integrated (01/30/2023)   Social Connection and Isolation Panel [NHANES]    Frequency of Communication with Friends and Family: Once a week    Frequency of Social Gatherings with Friends and Family: Twice a week    Attends Religious Services: More than 4 times per year    Active Member of [employer] or Organizations: No    Attends Banker Meetings: Never    Marital Status: Married  Catering manager Violence: Not At Risk (02/17/2023)   Humiliation, Afraid, Rape, and Kick questionnaire    Fear of Current or Ex-Partner: No    Emotionally Abused: No    Physically Abused: No    Sexually Abused: No    FAMILY HISTORY: Family History  Problem Relation Age of Onset   Hypertension Mother    Diabetes Mother    Arthritis Mother    Heart disease Mother    Rheum arthritis Father    Diabetes Father    Heart disease Father    Stomach cancer Brother 41   Stomach cancer Brother 21   Esophageal cancer Brother 32   Breast cancer Maternal Aunt 54   Breast cancer Maternal Aunt 72   Breast cancer Maternal Aunt 73   Colon cancer Neg Hx    Colon polyps Neg Hx    Pancreatic cancer Neg Hx     Review of Systems  Constitutional:  Positive for fatigue. Negative for appetite change, chills, fever and unexpected weight change.  HENT:   Negative for hearing loss, lump/mass, mouth sores and trouble swallowing.   Eyes:  Negative for eye problems and icterus.  Respiratory:  Negative for chest tightness, cough and  shortness of breath.   Cardiovascular:  Negative for chest pain, leg swelling and palpitations.  Gastrointestinal:  Positive for abdominal pain and constipation. Negative for abdominal distention, diarrhea, nausea and vomiting.  Endocrine: Negative for hot flashes.  Genitourinary:  Negative for difficulty urinating.   Musculoskeletal:  Positive for back pain. Negative for arthralgias.  Skin:  Negative for itching and rash.  Neurological:  Negative for dizziness, extremity weakness, headaches and numbness.  Hematological:  Negative for adenopathy. Does not bruise/bleed easily.  Psychiatric/Behavioral:  Negative for depression. The  patient is not nervous/anxious.       PHYSICAL EXAMINATION     Vitals:   04/06/23 1459  BP: 105/73  Pulse: (!) 105  Resp: 18  Temp: 97.7 F (36.5 C)      LABORATORY DATA:  Will add on lab testing today.     ASSESSMENT and THERAPY PLAN:   Breast carcinoma metastatic to multiple sites, right [facility]) Timia is a 77 year old woman with newly metastatic estrogen positive breast cancer.     Metastatic ER weak to moderate staining 70% positive breast cancer: We have on multiple occasions discussed the intent of treatment which is palliative.  We started her on Faslodex while she is undergoing palliative radiation for severe bone pain.  She just recently completed it about 2 weeks ago.  She is here to see her discussed about first-line treatment for metastatic breast cancer.  However since her last visit, she has lost weight, has not been eating, has gotten much frail recently went to the ER with failure to thrive.  She today also reports some abdominal pain and points to the epigastrium.  She is not quite sure if she wants to consider any more treatments at this time.  She is hoping to stay comfortable, pain-free and is willing to go home which she refers to.  She is very familiar with hospice and would like to proceed with hospice, her husband was in hospice.   She is understandably scared of the unknown but she is willing to proceed with hospice. right pleural effusion: Pleurx placed for drainage.  She has no shortness of breath today. Cancer related pain: She is now on oxycodone.  She has not been taking this.  She had tremendous relief since her radiation, no back pain.   Nausea, she is not able to swallow any pills hence have prescribed Zofran ODT.  She also requested a refill for Ativan since this has been very helpful. She also reported some epigastric pain.  She was previously on Nexium and Carafate which she has not been taking.  I requested that she go back on these medications since she may be having some radiation related esophagitis.  She expressed understanding.  She can also use oxycodone as needed.  Hospice referral placed.  All questions were answered. The patient knows to call the clinic with any problems, questions or concerns. We can certainly see the patient much sooner if necessary.  Total encounter time:40 minutes*in face-to-face visit time, chart review, lab review, care coordination, order entry, and documentation of the encounter time.   *Total Encounter Time as defined by the Centers for Medicare and Medicaid Services includes, in addition to the face-to-face time of a patient visit (documented in the note above) non-face-to-face time: obtaining and reviewing outside history, ordering and reviewing medications, tests or procedures, care coordination (communications with other health care professionals or caregivers) and documentation in the medical record.

## 2023-04-10 ENCOUNTER — Other Ambulatory Visit: Payer: Self-pay

## 2023-04-18 ENCOUNTER — Telehealth: Payer: Self-pay

## 2023-04-18 ENCOUNTER — Telehealth: Payer: Self-pay | Admitting: Pulmonary Disease

## 2023-04-18 NOTE — Telephone Encounter (Signed)
Stanton Kidney niece states patient passed away 05/08/23. Debra phone number is [phone].

## 2023-04-18 NOTE — Telephone Encounter (Signed)
Randye Lobo, niece, called and LVM stating that Pt passed away yesterday 2023-05-08. Inbasket message sent to Rhea Belton with HIM to check for death certificate. Cancelled future appts.

## 2023-04-18 NOTE — Telephone Encounter (Signed)
Sending to Dr.Dewald as a [place]

## 2023-04-23 ENCOUNTER — Other Ambulatory Visit: Payer: Self-pay | Admitting: Family Medicine

## 2023-05-01 DEATH — deceased

## 2023-05-03 ENCOUNTER — Inpatient Hospital Stay: Payer: Medicare HMO

## 2023-05-03 ENCOUNTER — Inpatient Hospital Stay: Payer: Medicare HMO | Admitting: Hematology and Oncology

## 2023-05-23 ENCOUNTER — Ambulatory Visit: Payer: Medicare PPO | Admitting: Pulmonary Disease

## 2023-11-23 ENCOUNTER — Ambulatory Visit: Payer: Medicare PPO | Admitting: Hematology and Oncology
